# Patient Record
Sex: Female | Born: 1950
Health system: Southern US, Community
[De-identification: ages and names within clinical notes are randomized; demographics above are authoritative.]

## PROBLEM LIST (undated history)

## (undated) DIAGNOSIS — I10 Essential (primary) hypertension: Secondary | ICD-10-CM

## (undated) DIAGNOSIS — M1990 Unspecified osteoarthritis, unspecified site: Secondary | ICD-10-CM

## (undated) DIAGNOSIS — R42 Dizziness and giddiness: Secondary | ICD-10-CM

## (undated) DIAGNOSIS — I471 Supraventricular tachycardia: Secondary | ICD-10-CM

## (undated) DIAGNOSIS — Z8679 Personal history of other diseases of the circulatory system: Secondary | ICD-10-CM

## (undated) DIAGNOSIS — E669 Obesity, unspecified: Secondary | ICD-10-CM

## (undated) DIAGNOSIS — H269 Unspecified cataract: Secondary | ICD-10-CM

## (undated) DIAGNOSIS — Z860100 Personal history of colon polyps, unspecified: Secondary | ICD-10-CM

## (undated) DIAGNOSIS — F419 Anxiety disorder, unspecified: Secondary | ICD-10-CM

## (undated) DIAGNOSIS — I4719 Other supraventricular tachycardia: Secondary | ICD-10-CM

## (undated) DIAGNOSIS — R011 Cardiac murmur, unspecified: Secondary | ICD-10-CM

## (undated) DIAGNOSIS — R7302 Impaired glucose tolerance (oral): Secondary | ICD-10-CM

## (undated) DIAGNOSIS — Z720 Tobacco use: Secondary | ICD-10-CM

## (undated) DIAGNOSIS — T7840XA Allergy, unspecified, initial encounter: Secondary | ICD-10-CM

## (undated) DIAGNOSIS — R7303 Prediabetes: Secondary | ICD-10-CM

## (undated) DIAGNOSIS — G4730 Sleep apnea, unspecified: Secondary | ICD-10-CM

## (undated) HISTORY — DX: Dizziness and giddiness: R42

## (undated) HISTORY — DX: Sleep apnea, unspecified: G47.30

## (undated) HISTORY — DX: Personal history of colon polyps, unspecified: Z86.0100

## (undated) HISTORY — DX: Unspecified cataract: H26.9

## (undated) HISTORY — DX: Impaired glucose tolerance (oral): R73.02

## (undated) HISTORY — PX: CATARACT EXTRACTION: SUR2

## (undated) HISTORY — DX: Cardiac murmur, unspecified: R01.1

## (undated) HISTORY — DX: Personal history of colonic polyps: Z86.010

## (undated) HISTORY — DX: Other supraventricular tachycardia: I47.19

## (undated) HISTORY — PX: EYE SURGERY: SHX253

## (undated) HISTORY — PX: POLYPECTOMY: SHX149

## (undated) HISTORY — DX: Anxiety disorder, unspecified: F41.9

## (undated) HISTORY — DX: Essential (primary) hypertension: I10

## (undated) HISTORY — DX: Unspecified osteoarthritis, unspecified site: M19.90

## (undated) HISTORY — DX: Allergy, unspecified, initial encounter: T78.40XA

## (undated) HISTORY — DX: Obesity, unspecified: E66.9

## (undated) HISTORY — DX: Tobacco use: Z72.0

## (undated) HISTORY — DX: Supraventricular tachycardia: I47.1

## (undated) HISTORY — PX: OTHER SURGICAL HISTORY: SHX169

## (undated) HISTORY — DX: Personal history of other diseases of the circulatory system: Z86.79

## (undated) HISTORY — PX: DIAGNOSTIC LAPAROSCOPY: SUR761

---

## 2002-12-19 ENCOUNTER — Ambulatory Visit (HOSPITAL_BASED_OUTPATIENT_CLINIC_OR_DEPARTMENT_OTHER): Admission: RE | Admit: 2002-12-19 | Discharge: 2002-12-19 | Payer: Self-pay | Admitting: Orthopedic Surgery

## 2004-03-19 ENCOUNTER — Ambulatory Visit: Payer: Self-pay | Admitting: Internal Medicine

## 2004-09-17 ENCOUNTER — Ambulatory Visit: Payer: Self-pay | Admitting: Internal Medicine

## 2005-02-17 ENCOUNTER — Ambulatory Visit: Payer: Self-pay | Admitting: Internal Medicine

## 2005-02-24 ENCOUNTER — Ambulatory Visit: Payer: Self-pay | Admitting: Internal Medicine

## 2005-03-17 ENCOUNTER — Ambulatory Visit: Payer: Self-pay | Admitting: Gastroenterology

## 2005-04-23 ENCOUNTER — Encounter: Payer: Self-pay | Admitting: Internal Medicine

## 2005-04-23 ENCOUNTER — Encounter (INDEPENDENT_AMBULATORY_CARE_PROVIDER_SITE_OTHER): Payer: Self-pay | Admitting: *Deleted

## 2005-04-23 ENCOUNTER — Ambulatory Visit: Payer: Self-pay | Admitting: Gastroenterology

## 2005-04-23 HISTORY — PX: COLONOSCOPY: SHX174

## 2005-08-31 ENCOUNTER — Ambulatory Visit: Payer: Self-pay | Admitting: Internal Medicine

## 2005-09-17 ENCOUNTER — Encounter: Payer: Self-pay | Admitting: Internal Medicine

## 2006-03-02 ENCOUNTER — Ambulatory Visit: Payer: Self-pay | Admitting: Internal Medicine

## 2006-08-31 ENCOUNTER — Ambulatory Visit: Payer: Self-pay | Admitting: Internal Medicine

## 2006-08-31 LAB — CONVERTED CEMR LAB
ALT: 36 units/L (ref 0–40)
AST: 32 units/L (ref 0–37)
Albumin: 3.3 g/dL — ABNORMAL LOW (ref 3.5–5.2)
Alkaline Phosphatase: 89 units/L (ref 39–117)
BUN: 15 mg/dL (ref 6–23)
Basophils Absolute: 0 10*3/uL (ref 0.0–0.1)
Basophils Relative: 0.1 % (ref 0.0–1.0)
Bilirubin, Direct: 0.2 mg/dL (ref 0.0–0.3)
CO2: 35 meq/L — ABNORMAL HIGH (ref 19–32)
Calcium: 9.4 mg/dL (ref 8.4–10.5)
Chloride: 100 meq/L (ref 96–112)
Creatinine, Ser: 0.8 mg/dL (ref 0.4–1.2)
Eosinophils Absolute: 0.3 10*3/uL (ref 0.0–0.6)
Eosinophils Relative: 3 % (ref 0.0–5.0)
GFR calc Af Amer: 96 mL/min
GFR calc non Af Amer: 79 mL/min
Glucose, Bld: 143 mg/dL — ABNORMAL HIGH (ref 70–99)
HCT: 40.6 % (ref 36.0–46.0)
Hemoglobin: 13.8 g/dL (ref 12.0–15.0)
Hgb A1c MFr Bld: 6.5 % — ABNORMAL HIGH (ref 4.6–6.0)
Lymphocytes Relative: 20 % (ref 12.0–46.0)
MCHC: 34.1 g/dL (ref 30.0–36.0)
MCV: 94.1 fL (ref 78.0–100.0)
Monocytes Absolute: 0.6 10*3/uL (ref 0.2–0.7)
Monocytes Relative: 6.6 % (ref 3.0–11.0)
Neutro Abs: 6.6 10*3/uL (ref 1.4–7.7)
Neutrophils Relative %: 70.3 % (ref 43.0–77.0)
Platelets: 337 10*3/uL (ref 150–400)
Potassium: 3.5 meq/L (ref 3.5–5.1)
RBC: 4.32 M/uL (ref 3.87–5.11)
RDW: 12.2 % (ref 11.5–14.6)
Sodium: 144 meq/L (ref 135–145)
TSH: 1.29 microintl units/mL (ref 0.35–5.50)
Total Bilirubin: 0.7 mg/dL (ref 0.3–1.2)
Total Protein: 7.4 g/dL (ref 6.0–8.3)
WBC: 9.4 10*3/uL (ref 4.5–10.5)

## 2006-11-24 DIAGNOSIS — F410 Panic disorder [episodic paroxysmal anxiety] without agoraphobia: Secondary | ICD-10-CM | POA: Insufficient documentation

## 2006-11-24 DIAGNOSIS — I10 Essential (primary) hypertension: Secondary | ICD-10-CM | POA: Insufficient documentation

## 2006-11-24 DIAGNOSIS — E669 Obesity, unspecified: Secondary | ICD-10-CM | POA: Insufficient documentation

## 2006-11-24 DIAGNOSIS — I471 Supraventricular tachycardia: Secondary | ICD-10-CM | POA: Insufficient documentation

## 2006-12-08 ENCOUNTER — Ambulatory Visit: Payer: Self-pay | Admitting: Internal Medicine

## 2006-12-08 LAB — CONVERTED CEMR LAB
ALT: 22 units/L (ref 0–35)
AST: 19 units/L (ref 0–37)
Albumin: 3.4 g/dL — ABNORMAL LOW (ref 3.5–5.2)
Alkaline Phosphatase: 80 units/L (ref 39–117)
BUN: 13 mg/dL (ref 6–23)
Basophils Absolute: 0 10*3/uL (ref 0.0–0.1)
Basophils Relative: 0.5 % (ref 0.0–1.0)
Bilirubin, Direct: 0.1 mg/dL (ref 0.0–0.3)
Blood in Urine, dipstick: NEGATIVE
CO2: 32 meq/L (ref 19–32)
Calcium: 9.5 mg/dL (ref 8.4–10.5)
Chloride: 103 meq/L (ref 96–112)
Cholesterol: 194 mg/dL (ref 0–200)
Creatinine, Ser: 0.9 mg/dL (ref 0.4–1.2)
Eosinophils Absolute: 0.2 10*3/uL (ref 0.0–0.6)
Eosinophils Relative: 2.2 % (ref 0.0–5.0)
GFR calc Af Amer: 84 mL/min
GFR calc non Af Amer: 69 mL/min
Glucose, Bld: 108 mg/dL — ABNORMAL HIGH (ref 70–99)
Glucose, Urine, Semiquant: NEGATIVE
HCT: 42.4 % (ref 36.0–46.0)
HDL: 21 mg/dL — ABNORMAL LOW (ref >39.0)
Hemoglobin: 14.4 g/dL (ref 12.0–15.0)
Ketones, urine, test strip: NEGATIVE
LDL Cholesterol: 150 mg/dL — ABNORMAL HIGH (ref 0–99)
Lymphocytes Relative: 20.6 % (ref 12.0–46.0)
MCHC: 33.9 g/dL (ref 30.0–36.0)
MCV: 94.6 fL (ref 78.0–100.0)
Monocytes Absolute: 0.5 10*3/uL (ref 0.2–0.7)
Monocytes Relative: 7.2 % (ref 3.0–11.0)
Neutro Abs: 4.9 10*3/uL (ref 1.4–7.7)
Neutrophils Relative %: 69.5 % (ref 43.0–77.0)
Nitrite: NEGATIVE
Platelets: 304 10*3/uL (ref 150–400)
Potassium: 4.3 meq/L (ref 3.5–5.1)
Protein, U semiquant: NEGATIVE
RBC: 4.47 M/uL (ref 3.87–5.11)
RDW: 12.5 % (ref 11.5–14.6)
Sodium: 144 meq/L (ref 135–145)
Specific Gravity, Urine: 1.02
TSH: 1.65 microintl units/mL (ref 0.35–5.50)
Total Bilirubin: 0.9 mg/dL (ref 0.3–1.2)
Total CHOL/HDL Ratio: 9.2
Total Protein: 7.3 g/dL (ref 6.0–8.3)
Triglycerides: 116 mg/dL (ref 0–149)
Urobilinogen, UA: NEGATIVE
VLDL: 23 mg/dL (ref 0–40)
WBC: 7.1 10*3/uL (ref 4.5–10.5)
pH: 6

## 2006-12-14 ENCOUNTER — Encounter: Payer: Self-pay | Admitting: Internal Medicine

## 2006-12-15 ENCOUNTER — Ambulatory Visit: Payer: Self-pay | Admitting: Internal Medicine

## 2006-12-15 DIAGNOSIS — B351 Tinea unguium: Secondary | ICD-10-CM | POA: Insufficient documentation

## 2007-04-19 ENCOUNTER — Ambulatory Visit: Payer: Self-pay | Admitting: Internal Medicine

## 2007-11-25 ENCOUNTER — Ambulatory Visit: Payer: Self-pay | Admitting: Internal Medicine

## 2007-11-25 DIAGNOSIS — F419 Anxiety disorder, unspecified: Secondary | ICD-10-CM | POA: Insufficient documentation

## 2007-11-25 DIAGNOSIS — Z86010 Personal history of colonic polyps: Secondary | ICD-10-CM | POA: Insufficient documentation

## 2007-11-25 DIAGNOSIS — J309 Allergic rhinitis, unspecified: Secondary | ICD-10-CM | POA: Insufficient documentation

## 2007-11-25 DIAGNOSIS — F411 Generalized anxiety disorder: Secondary | ICD-10-CM

## 2008-05-25 ENCOUNTER — Ambulatory Visit: Payer: Self-pay | Admitting: Internal Medicine

## 2008-05-25 LAB — CONVERTED CEMR LAB: Hgb A1c MFr Bld: 5.7 % (ref 4.6–6.0)

## 2008-07-04 ENCOUNTER — Telehealth: Payer: Self-pay | Admitting: Internal Medicine

## 2008-11-16 ENCOUNTER — Ambulatory Visit: Payer: Self-pay | Admitting: Internal Medicine

## 2008-11-16 LAB — CONVERTED CEMR LAB: Blood Glucose, Fingerstick: 102

## 2009-03-14 ENCOUNTER — Ambulatory Visit: Payer: Self-pay | Admitting: Internal Medicine

## 2009-06-18 ENCOUNTER — Ambulatory Visit: Payer: Self-pay | Admitting: Internal Medicine

## 2009-06-18 LAB — CONVERTED CEMR LAB: Blood Glucose, Fingerstick: 80

## 2009-12-10 ENCOUNTER — Ambulatory Visit: Payer: Self-pay | Admitting: Internal Medicine

## 2010-06-06 ENCOUNTER — Ambulatory Visit
Admission: RE | Admit: 2010-06-06 | Discharge: 2010-06-06 | Payer: Self-pay | Source: Home / Self Care | Attending: Internal Medicine | Admitting: Internal Medicine

## 2010-06-06 ENCOUNTER — Other Ambulatory Visit: Payer: Self-pay | Admitting: Internal Medicine

## 2010-06-06 LAB — BASIC METABOLIC PANEL
BUN: 18 mg/dL (ref 6–23)
CO2: 32 mEq/L (ref 19–32)
Calcium: 9.2 mg/dL (ref 8.4–10.5)
Chloride: 103 mEq/L (ref 96–112)
Creatinine, Ser: 0.7 mg/dL (ref 0.4–1.2)
GFR: 97.27 mL/min (ref >60.00)
Glucose, Bld: 102 mg/dL — ABNORMAL HIGH (ref 70–99)
Potassium: 5 mEq/L (ref 3.5–5.1)
Sodium: 144 mEq/L (ref 135–145)

## 2010-06-06 LAB — CONVERTED CEMR LAB
Blood in Urine, dipstick: NEGATIVE
Ketones, urine, test strip: NEGATIVE
Nitrite: NEGATIVE
Protein, U semiquant: NEGATIVE
Specific Gravity, Urine: 1.02
Urobilinogen, UA: 0.2
WBC Urine, dipstick: NEGATIVE

## 2010-06-06 LAB — CBC WITH DIFFERENTIAL/PLATELET
Basophils Absolute: 0 10*3/uL (ref 0.0–0.1)
Basophils Relative: 0.5 % (ref 0.0–3.0)
Eosinophils Absolute: 0.2 10*3/uL (ref 0.0–0.7)
Eosinophils Relative: 3.7 % (ref 0.0–5.0)
HCT: 40.5 % (ref 36.0–46.0)
Hemoglobin: 13.8 g/dL (ref 12.0–15.0)
Lymphocytes Relative: 22.1 % (ref 12.0–46.0)
Lymphs Abs: 1.5 10*3/uL (ref 0.7–4.0)
MCHC: 34.1 g/dL (ref 30.0–36.0)
MCV: 96.5 fl (ref 78.0–100.0)
Monocytes Absolute: 0.5 10*3/uL (ref 0.1–1.0)
Monocytes Relative: 7.2 % (ref 3.0–12.0)
Neutro Abs: 4.4 10*3/uL (ref 1.4–7.7)
Neutrophils Relative %: 66.5 % (ref 43.0–77.0)
Platelets: 293 10*3/uL (ref 150.0–400.0)
RBC: 4.2 Mil/uL (ref 3.87–5.11)
RDW: 13.2 % (ref 11.5–14.6)
WBC: 6.6 10*3/uL (ref 4.5–10.5)

## 2010-06-06 LAB — HEPATIC FUNCTION PANEL
ALT: 19 U/L (ref 0–35)
AST: 20 U/L (ref 0–37)
Albumin: 3.7 g/dL (ref 3.5–5.2)
Alkaline Phosphatase: 69 U/L (ref 39–117)
Bilirubin, Direct: 0.1 mg/dL (ref 0.0–0.3)
Total Bilirubin: 1 mg/dL (ref 0.3–1.2)
Total Protein: 6.8 g/dL (ref 6.0–8.3)

## 2010-06-06 LAB — LIPID PANEL
Cholesterol: 217 mg/dL — ABNORMAL HIGH (ref 0–200)
HDL: 36.9 mg/dL — ABNORMAL LOW (ref >39.00)
Total CHOL/HDL Ratio: 6
Triglycerides: 131 mg/dL (ref 0.0–149.0)
VLDL: 26.2 mg/dL (ref 0.0–40.0)

## 2010-06-06 LAB — TSH: TSH: 1.5 u[IU]/mL (ref 0.35–5.50)

## 2010-06-06 LAB — LDL CHOLESTEROL, DIRECT: Direct LDL: 170.5 mg/dL

## 2010-06-13 ENCOUNTER — Ambulatory Visit
Admission: RE | Admit: 2010-06-13 | Discharge: 2010-06-13 | Payer: Self-pay | Source: Home / Self Care | Attending: Internal Medicine | Admitting: Internal Medicine

## 2010-06-13 LAB — HM DIABETES FOOT EXAM

## 2010-06-17 NOTE — Assessment & Plan Note (Signed)
Summary: 6 month rov/njr/pt rescd//ccm   Vital Signs:  Patient profile:   60 year old female Weight:      219 pounds Temp:     98.7 degrees F oral BP sitting:   120 / 80  (right arm) Cuff size:   large  Vitals Entered By: Raechel Ache, RN (June 18, 2009 12:53 PM) CC: rov , no complaints CBG Result 80   CC:  rov  and no complaints.  History of Present Illness: 60 year old  patient  who is in today for follow-up of her hypertension.  She has a history of impaired glucose tolerance, which has resolved with weight loss.  Over the past 6 months.  Her weight has been basically unchanged.  She feels well today.  Her last Timbo, and once he was in a nondiabetic range.  A random blood sugar today 80  Allergies: 1)  ! Erythromycin 2)  ! Amoxicillin  Past History:  Past Medical History: Reviewed history from 05/25/2008 and no changes required. Hypertension Colonic polyps, hx of  tobacco use history of PSVT Allergic rhinitis Anxiety impaired glucose tolerance  Review of Systems  The patient denies anorexia, fever, weight loss, weight gain, vision loss, decreased hearing, hoarseness, chest pain, syncope, dyspnea on exertion, peripheral edema, prolonged cough, headaches, hemoptysis, abdominal pain, melena, hematochezia, severe indigestion/heartburn, hematuria, incontinence, genital sores, muscle weakness, suspicious skin lesions, transient blindness, difficulty walking, depression, unusual weight change, abnormal bleeding, enlarged lymph nodes, angioedema, and breast masses.    Physical Exam  General:  overweight-appearing.  118/78overweight-appearing.   Head:  Normocephalic and atraumatic without obvious abnormalities. No apparent alopecia or balding. Mouth:  Oral mucosa and oropharynx without lesions or exudates.  Teeth in good repair. Neck:  No deformities, masses, or tenderness noted. Lungs:  Normal respiratory effort, chest expands symmetrically. Lungs are clear to  auscultation, no crackles or wheezes. Heart:  Normal rate and regular rhythm. S1 and S2 normal without gallop, murmur, click, rub or other extra sounds. Abdomen:  Bowel sounds positive,abdomen soft and non-tender without masses, organomegaly or hernias noted. Msk:  No deformity or scoliosis noted of thoracic or lumbar spine.   Pulses:  R and L carotid,radial,femoral,dorsalis pedis and posterior tibial pulses are full and equal bilaterally Extremities:  No clubbing, cyanosis, edema, or deformity noted with normal full range of motion of all joints.    Diabetes Management Exam:    Eye Exam:       Eye Exam done here today          Results: normal   Impression & Recommendations:  Problem # 1:  DIABETES MELLITUS, TYPE II (ICD-250.00)  Her updated medication list for this problem includes:    Benazepril-hydrochlorothiazide 20-12.5 Mg Tabs (Benazepril-hydrochlorothiazide) .Marland Kitchen... 1/2 once daily    Bayer Low Strength 81 Mg Tbec (Aspirin) .Marland Kitchen... 1 once daily  Her updated medication list for this problem includes:    Benazepril-hydrochlorothiazide 20-12.5 Mg Tabs (Benazepril-hydrochlorothiazide) .Marland Kitchen... 1/2 once daily    Bayer Low Strength 81 Mg Tbec (Aspirin) .Marland Kitchen... 1 once daily  Orders: Capillary Blood Glucose/CBG (84132)  Problem # 2:  HYPERTENSION (ICD-401.9)  Her updated medication list for this problem includes:    Toprol Xl 50 Mg Tb24 (Metoprolol succinate) .Marland Kitchen... 1 once daily    Benazepril-hydrochlorothiazide 20-12.5 Mg Tabs (Benazepril-hydrochlorothiazide) .Marland Kitchen... 1/2 once daily  Her updated medication list for this problem includes:    Toprol Xl 50 Mg Tb24 (Metoprolol succinate) .Marland Kitchen... 1 once daily    Benazepril-hydrochlorothiazide  20-12.5 Mg Tabs (Benazepril-hydrochlorothiazide) .Marland Kitchen... 1/2 once daily  Complete Medication List: 1)  Toprol Xl 50 Mg Tb24 (Metoprolol succinate) .Marland Kitchen.. 1 once daily 2)  Paroxetine Hcl 40 Mg Tabs (Paroxetine hcl) .Marland Kitchen.. 1 once daily 3)   Benazepril-hydrochlorothiazide 20-12.5 Mg Tabs (Benazepril-hydrochlorothiazide) .... 1/2 once daily 4)  Bayer Low Strength 81 Mg Tbec (Aspirin) .Marland Kitchen.. 1 once daily 5)  Triamcinolone Acetonide 0.1 % Crea (Triamcinolone acetonide) .... Use twice daily as needed  Patient Instructions: 1)  Please schedule a follow-up appointment in 6 months. 2)  Limit your Sodium (Salt). 3)  It is important that you exercise regularly at least 20 minutes 5 times a week. If you develop chest pain, have severe difficulty breathing, or feel very tired , stop exercising immediately and seek medical attention. 4)  You need to lose weight. Consider a lower calorie diet and regular exercise.  5)  Check your Blood Pressure regularly. If it is above: 150/90  you should make an appointment. Prescriptions: PAROXETINE HCL 40 MG  TABS (PAROXETINE HCL) 1 once daily  #180 Tablet x 6   Entered and Authorized by:   Gordy Savers  MD   Signed by:   Gordy Savers  MD on 06/18/2009   Method used:   Print then Give to Patient   RxID:   [id number] BENAZEPRIL-HYDROCHLOROTHIAZIDE 20-12.5 MG  TABS (BENAZEPRIL-HYDROCHLOROTHIAZIDE) 1/2 once daily  #90 x 6   Entered and Authorized by:   Gordy Savers  MD   Signed by:   Gordy Savers  MD on 06/18/2009   Method used:   Print then Give to Patient   RxID:   [id number] TOPROL XL 50 MG  TB24 (METOPROLOL SUCCINATE) 1 once daily  #90 Tablet x 6   Entered and Authorized by:   Gordy Savers  MD   Signed by:   Gordy Savers  MD on 06/18/2009   Method used:   Print then Give to Patient   RxID:   [id number]

## 2010-06-17 NOTE — Assessment & Plan Note (Signed)
Summary: 6 month fup//ccm   Vital Signs:  Patient profile:   60 year old female Weight:      220 pounds Temp:     98.5 degrees F oral BP sitting:   110 / 70  (right arm) Cuff size:   regular  Vitals Entered By: Duard Brady LPN (December 10, 2009 8:09 AM) CC: 6 moas rov - (R) ear problems   CC:  6 moas rov - (R) ear problems.  History of Present Illness: 60 year old patient seen today for follow-up of her hypertension.  She has done quite well.  She has a history of a mild pain disorder, which has been quite stable.  Her only complaint is  some intermittent right ear discomfort.  This actually is in the high right submandibular region.  This is associated with postnasal drip and mild sore throat.  No fever.  She has a history of impaired glucose tolerance that has resolved with weight loss.  Her weight  today is basically unchanged compared to 6 months ago  Preventive Screening-Counseling & Management  Alcohol-Tobacco     Smoking Status: current  Allergies: 1)  ! Erythromycin 2)  ! Amoxicillin  Past History:  Past Medical History: Reviewed history from 05/25/2008 and no changes required. Hypertension Colonic polyps, hx of  tobacco use history of PSVT Allergic rhinitis Anxiety impaired glucose tolerance  Past Surgical History: Reviewed history from 11/24/2006 and no changes required. G3 P2 A1 Cataract extraction Ankle sx-left fx Foot sx Colonoscopy-04/23/2005  Review of Systems  The patient denies anorexia, fever, weight loss, weight gain, vision loss, decreased hearing, hoarseness, chest pain, syncope, dyspnea on exertion, peripheral edema, prolonged cough, headaches, hemoptysis, abdominal pain, melena, hematochezia, severe indigestion/heartburn, hematuria, incontinence, genital sores, muscle weakness, suspicious skin lesions, transient blindness, difficulty walking, depression, unusual weight change, abnormal bleeding, enlarged lymph nodes, angioedema, and breast  masses.    Physical Exam  General:  overweight-appearing.  110/70 Head:  Normocephalic and atraumatic without obvious abnormalities. No apparent alopecia or balding. Eyes:  No corneal or conjunctival inflammation noted. EOMI. Perrla. Funduscopic exam benign, without hemorrhages, exudates or papilledema. Vision grossly normal. Ears:  External ear exam shows no significant lesions or deformities.  Otoscopic examination reveals clear canals, tympanic membranes are intact bilaterally without bulging, retraction, inflammation or discharge. Hearing is grossly normal bilaterally. Mouth:  pharyngeal erythema.  pharyngeal erythema.   Neck:  slight tenderness in the high right mandibular area Chest Wall:  No deformities, masses, or tenderness noted. Lungs:  Normal respiratory effort, chest expands symmetrically. Lungs are clear to auscultation, no crackles or wheezes. Heart:  Normal rate and regular rhythm. S1 and S2 normal without gallop, murmur, click, rub or other extra sounds. Abdomen:  Bowel sounds positive,abdomen soft and non-tender without masses, organomegaly or hernias noted. Msk:  No deformity or scoliosis noted of thoracic or lumbar spine.   Pulses:  R and L carotid,radial,femoral,dorsalis pedis and posterior tibial pulses are full and equal bilaterally  Diabetes Management Exam:    Foot Exam (with socks and/or shoes not present):       Sensory-Pinprick/Light touch:          Left medial foot (L-4): normal          Left dorsal foot (L-5): normal          Left lateral foot (S-1): normal          Right medial foot (L-4): normal          Right dorsal foot (L-5):  normal          Right lateral foot (S-1): normal       Sensory-Monofilament:          Left foot: normal          Right foot: normal       Inspection:          Left foot: normal          Right foot: normal       Nails:          Left foot: normal          Right foot: normal    Foot Exam by Podiatrist:       Date: 12/10/2009        Results: no diabetic findings       Done by: PCP   Impression & Recommendations:  Problem # 1:  ALLERGIC RHINITIS (ICD-477.9)  Her updated medication list for this problem includes:    Fluticasone Propionate 50 Mcg/act Susp (Fluticasone propionate) ..... Use daily  Problem # 2:  PANIC DISORDER (ICD-300.01)  Her updated medication list for this problem includes:    Paroxetine Hcl 40 Mg Tabs (Paroxetine hcl) .Marland Kitchen... 1 once daily  Her updated medication list for this problem includes:    Paroxetine Hcl 40 Mg Tabs (Paroxetine hcl) .Marland Kitchen... 1 once daily  Problem # 3:  HYPERTENSION (ICD-401.9)  Her updated medication list for this problem includes:    Toprol Xl 50 Mg Tb24 (Metoprolol succinate) .Marland Kitchen... 1 once daily    Benazepril-hydrochlorothiazide 20-12.5 Mg Tabs (Benazepril-hydrochlorothiazide) .Marland Kitchen... 1/2 once daily  Her updated medication list for this problem includes:    Toprol Xl 50 Mg Tb24 (Metoprolol succinate) .Marland Kitchen... 1 once daily    Benazepril-hydrochlorothiazide 20-12.5 Mg Tabs (Benazepril-hydrochlorothiazide) .Marland Kitchen... 1/2 once daily  Complete Medication List: 1)  Toprol Xl 50 Mg Tb24 (Metoprolol succinate) .Marland Kitchen.. 1 once daily 2)  Paroxetine Hcl 40 Mg Tabs (Paroxetine hcl) .Marland Kitchen.. 1 once daily 3)  Benazepril-hydrochlorothiazide 20-12.5 Mg Tabs (Benazepril-hydrochlorothiazide) .... 1/2 once daily 4)  Bayer Low Strength 81 Mg Tbec (Aspirin) .Marland Kitchen.. 1 once daily 5)  Triamcinolone Acetonide 0.1 % Crea (Triamcinolone acetonide) .... Use twice daily as needed 6)  Fluticasone Propionate 50 Mcg/act Susp (Fluticasone propionate) .... Use daily  Patient Instructions: 1)  Please schedule a follow-up appointment in 6 months. 2)  Limit your Sodium (Salt). 3)  It is important that you exercise regularly at least 20 minutes 5 times a week. If you develop chest pain, have severe difficulty breathing, or feel very tired , stop exercising immediately and seek medical attention. 4)  You need to lose  weight. Consider a lower calorie diet and regular exercise.  Prescriptions: FLUTICASONE PROPIONATE 50 MCG/ACT SUSP (FLUTICASONE PROPIONATE) use daily  #1 x 6   Entered and Authorized by:   Gordy Savers  MD   Signed by:   Gordy Savers  MD on 12/10/2009   Method used:   [facility]ronically to        [facility]       [address]       [place], [place]  [address]       Ph: [phone]       Fax: [phone]   RxID:   [id number] BENAZEPRIL-HYDROCHLOROTHIAZIDE 20-12.5 MG  TABS (BENAZEPRIL-HYDROCHLOROTHIAZIDE) 1/2 once daily  #90 x 6   Entered and Authorized by:   Gordy Savers  MD  Signed by:   Gordy Savers  MD on 12/10/2009   Method used:   Electronically to        [facility]       [address]       [place], [place]  [address]       Ph: [phone]       Fax: [phone]   RxID:   [id number] PAROXETINE HCL 40 MG  TABS (PAROXETINE HCL) 1 once daily  #180 Tablet x 6   Entered and Authorized by:   Gordy Savers  MD   Signed by:   Gordy Savers  MD on 12/10/2009   Method used:   Electronically to        [facility]       [address]       [place], [place]  [address]       Ph: [phone]       Fax: [phone]   RxID:   [id number] TOPROL XL 50 MG  TB24 (METOPROLOL SUCCINATE) 1 once daily  #90 Tablet x 6   Entered and Authorized by:   Gordy Savers  MD   Signed by:   Gordy Savers  MD on 12/10/2009   Method used:   Electronically to        [facility] (retail)       [address]       [place], [place]  [address]       Ph: [phone]       Fax: [phone]   RxID:   [id number]   Appended Document: 6 month fup//ccm    CBG Result 124   Allergies: 1)  ! Erythromycin 2)  ! Amoxicillin   Complete Medication List: 1)  Toprol  Xl 50 Mg Tb24 (Metoprolol succinate) .Marland Kitchen.. 1 once daily 2)  Paroxetine Hcl 40 Mg Tabs (Paroxetine hcl) .Marland Kitchen.. 1 once daily 3)  Benazepril-hydrochlorothiazide 20-12.5 Mg Tabs (Benazepril-hydrochlorothiazide) .... 1/2 once daily 4)  Bayer Low Strength 81 Mg Tbec (Aspirin) .Marland Kitchen.. 1 once daily 5)  Triamcinolone Acetonide 0.1 % Crea (Triamcinolone acetonide) .... Use twice daily as needed 6)  Fluticasone Propionate 50 Mcg/act Susp (Fluticasone propionate) .... Use daily  Other Orders: Capillary Blood Glucose/CBG [phone])

## 2010-06-19 NOTE — Assessment & Plan Note (Signed)
Summary: cpx/no pap/njr   Vital Signs:  Patient profile:   60 year old [name] Height:      67 inches Weight:      220 pounds BMI:     34.58 O2 Sat:      97 % on Room air Temp:     98.3 degrees F oral Pulse rate:   89 / minute BP sitting:   120 / 76  (right arm) Cuff size:   regular  Vitals Entered By: Duard Brady LPN (June 13, 2010 8:45 AM)  O2 Flow:  Room air CC: cpx - doing well  Is Patient Diabetic? No   CC:  cpx - doing well .  History of Present Illness: 60 year old patient who is seen today for a health maintenance examination.  She has a history of colonic polyps.  Impaired glucose toleranc, exogenous  obesity and treated hypertension.  She has done quite well.  Her last mammogram was approximately 1 1/2 years ago.  She has not received routine gynecologic care in a number of years since her gynecologist retired.  Her blood pressure has been well controlled.  Her weight has been unchanged.  Laboratory studies reviewed  Preventive Screening-Counseling & Management  Alcohol-Tobacco     Smoking Status: current     Smoking Cessation Counseling: yes  Allergies: 1)  ! Erythromycin 2)  ! Amoxicillin  Past History:  Past Medical History: Hypertension Colonic polyps, hx of  tobacco use history of PSVT Allergic rhinitis Anxiety impaired glucose tolerance obesity  Past Surgical History: Reviewed history from 11/24/2006 and no changes required. G3 P2 A1 Cataract extraction Ankle sx-left fx Foot sx Colonoscopy-04/23/2005  Family History: Reviewed history from 11/25/2007 and no changes required. Fam hx Alzheimers Father died 7, complications of dementia.  Mother's in her 64s in good health one brother died at 43, unclear whether he died as a result of alcohol-like intoxication or possibly a congenital heart disease.  Two sisters are well mother status post CABG   Social History: Reviewed history from 04/19/2007 and no changes required. mother to  undergo CABG later this week  Review of Systems  The patient denies anorexia, fever, weight loss, weight gain, vision loss, decreased hearing, hoarseness, chest pain, syncope, dyspnea on exertion, peripheral edema, prolonged cough, headaches, hemoptysis, abdominal pain, melena, hematochezia, severe indigestion/heartburn, hematuria, incontinence, genital sores, muscle weakness, suspicious skin lesions, transient blindness, difficulty walking, depression, unusual weight change, abnormal bleeding, enlarged lymph nodes, angioedema, and breast masses.    Physical Exam  General:  overweight-appearing.  blood pressure low normal  Head:  Normocephalic and atraumatic without obvious abnormalities. No apparent alopecia or balding. Eyes:  No corneal or conjunctival inflammation noted. EOMI. Perrla. Funduscopic exam benign, without hemorrhages, exudates or papilledema. Vision grossly normal. Ears:  External ear exam shows no significant lesions or deformities.  Otoscopic examination reveals clear canals, tympanic membranes are intact bilaterally without bulging, retraction, inflammation or discharge. Hearing is grossly normal bilaterally. Nose:  External nasal examination shows no deformity or inflammation. Nasal mucosa are pink and moist without lesions or exudates. Mouth:  Oral mucosa and oropharynx without lesions or exudates.  Teeth in good repair. Neck:  No deformities, masses, or tenderness noted. Chest Wall:  No deformities, masses, or tenderness noted. Lungs:  Normal respiratory effort, chest expands symmetrically. Lungs are clear to auscultation, no crackles or wheezes. Heart:  Normal rate and regular rhythm. S1 and S2 normal without gallop, murmur, click, rub or other extra sounds. Abdomen:  Bowel sounds positive,abdomen  soft and non-tender without masses, organomegaly or hernias noted. Msk:  No deformity or scoliosis noted of thoracic or lumbar spine.   Pulses:  R and L  carotid,radial,femoral,dorsalis pedis and posterior tibial pulses are full and equal bilaterally Extremities:  No clubbing, cyanosis, edema, or deformity noted with normal full range of motion of all joints.   Neurologic:  No cranial nerve deficits noted. Station and gait are normal. Plantar reflexes are down-going bilaterally. DTRs are symmetrical throughout. Sensory, motor and coordinative functions appear intact. Skin:  Intact without suspicious lesions or rashes Cervical Nodes:  No lymphadenopathy noted Axillary Nodes:  No palpable lymphadenopathy Inguinal Nodes:  No significant adenopathy Psych:  Cognition and judgment appear intact. Alert and cooperative with normal attention span and concentration. No apparent delusions, illusions, hallucinations  Diabetes Management Exam:    Foot Exam (with socks and/or shoes not present):       Sensory-Pinprick/Light touch:          Left medial foot (L-4): normal          Left dorsal foot (L-5): normal          Left lateral foot (S-1): normal          Right medial foot (L-4): normal          Right dorsal foot (L-5): normal          Right lateral foot (S-1): normal       Sensory-Monofilament:          Left foot: normal          Right foot: normal       Inspection:          Left foot: normal          Right foot: normal       Nails:          Left foot: fungal infection          Right foot: fungal infection    Foot Exam by Podiatrist:       Date: 06/13/2010       Results: no diabetic findings       Done by: pcp    Eye Exam:       Eye Exam done here today          Results: normal   Impression & Recommendations:  Problem # 1:  PREVENTIVE HEALTH CARE (ICD-V70.0)  Complete Medication List: 1)  Toprol Xl 50 Mg Tb24 (Metoprolol succinate) .Marland Kitchen.. 1 once daily 2)  Paroxetine Hcl 40 Mg Tabs (Paroxetine hcl) .Marland Kitchen.. 1 once daily 3)  Benazepril-hydrochlorothiazide 20-12.5 Mg Tabs (Benazepril-hydrochlorothiazide) .... 1/2 once daily 4)  Bayer Low  Strength 81 Mg Tbec (Aspirin) .Marland Kitchen.. 1 once daily 5)  Triamcinolone Acetonide 0.1 % Crea (Triamcinolone acetonide) .... Use twice daily as needed 6)  Fluticasone Propionate 50 Mcg/act Susp (Fluticasone propionate) .... Use daily 7)  Alprazolam 0.25 Mg Tabs (Alprazolam) .... One twice daily as needed  Patient Instructions: 1)  Please schedule a follow-up appointment in 6 months. 2)  Limit your Sodium (Salt). 3)  Tobacco is very bad for your health and your loved ones! You Should stop smoking!. 4)  It is important that you exercise regularly at least 20 minutes 5 times a week. If you develop chest pain, have severe difficulty breathing, or feel very tired , stop exercising immediately and seek medical attention. 5)  You need to lose weight. Consider a lower calorie diet and regular exercise.  6)  Schedule  your mammogram. 7)  Take calcium +Vitamin D daily. 8)  Schedule a colonoscopy/sigmoidoscopy to help detect colon cancer. Prescriptions: ALPRAZOLAM 0.25 MG TABS (ALPRAZOLAM) one twice daily as needed  #50 x 0   Entered and Authorized by:   Gordy Savers  MD   Signed by:   Gordy Savers  MD on 06/13/2010   Method used:   Print then Give to Patient   RxID:   [id number] FLUTICASONE PROPIONATE 50 MCG/ACT SUSP (FLUTICASONE PROPIONATE) use daily  #1 x 6   Entered and Authorized by:   Gordy Savers  MD   Signed by:   Gordy Savers  MD on 06/13/2010   Method used:   Electronically to        [facility]       [address]       [place], [place]  [address]       Ph: [phone]       Fax: [phone]   RxID:   [id number] BENAZEPRIL-HYDROCHLOROTHIAZIDE 20-12.5 MG  TABS (BENAZEPRIL-HYDROCHLOROTHIAZIDE) 1/2 once daily  #90 x 6   Entered and Authorized by:   Gordy Savers  MD   Signed by:   Gordy Savers  MD on 06/13/2010   Method used:   Electronically to        [facility]        [address]       [place], [place]  [address]       Ph: [phone]       Fax: [phone]   RxID:   [id number] PAROXETINE HCL 40 MG  TABS (PAROXETINE HCL) 1 once daily  #180 Tablet x 6   Entered and Authorized by:   Gordy Savers  MD   Signed by:   Gordy Savers  MD on 06/13/2010   Method used:   Electronically to        [facility]       [address]       [place], [place]  [address]       Ph: [phone]       Fax: [phone]   RxID:   [id number] TOPROL XL 50 MG  TB24 (METOPROLOL SUCCINATE) 1 once daily  #90 Tablet x 6   Entered and Authorized by:   Gordy Savers  MD   Signed by:   Gordy Savers  MD on 06/13/2010   Method used:   Electronically to        [facility] (retail)       [address]       [place], [place]  [address]       Ph: [phone]       Fax: [phone]   RxID:   [id number]    Orders Added: 1)  Est. Patient 40-64 years [[id number]]

## 2010-10-03 NOTE — Op Note (Signed)
NAME:  Glenda [name], Glenda [name]                           ACCOUNT NO.:  [id number]   MEDICAL RECORD NO.:  [id number]                   PATIENT TYPE:  AMB   LOCATION:  [facility]SC                                  FACILITY:  [facility]   PHYSICIAN:  Leonides Grills, M.D.                  DATE OF BIRTH:  07-Aug-1950   DATE OF PROCEDURE:  12/19/2002  DATE OF DISCHARGE:                                 OPERATIVE REPORT   PREOPERATIVE DIAGNOSIS:  Delayed union, left anterior process calcaneus  fracture.   POSTOPERATIVE DIAGNOSIS:  Delayed union, left anterior process calcaneus  fracture.   OPERATION PERFORMED:  1. Excision, left anterior process calcaneus.  2. Left stress x-rays foot.   SURGEON:  Leonides Grills, M.D.   ASSISTANT:  Lianne Cure, P.A.   ANESTHESIA:  General endotracheal tube.   ESTIMATED BLOOD LOSS:  Minimal.   TOURNIQUET TIME:  Approximately 20 minutes.   COMPLICATIONS:  None.   DISPOSITION:  Stable to PR.   INDICATIONS FOR PROCEDURE:  The patient is a 60 year old female who  sustained the above injury which has become persistently painful and has  decreased her overall subtalar motion due to the persistent pain.  After  options of continuing conservative management versus excision, the patient  wished to proceed with excision to accelerate her recovery by increasing her  ability to range her subtalar joint and she can weightbear immediately.  We  also discussed also fixing this piece which would also have to delay her  recovery and she wished to proceed with the excision of the fragment with  early mobilization. We went over the procedure in great detail explaining  all risks which include infection, neurovascular injury, arthritis,  persistent pain, worsening pain were all explained. Questions were  encouraged and answered.   DESCRIPTION OF PROCEDURE:  The patient was brought to the operating room and  placed in supine position initially after adequate general endotracheal tube  anesthesia was administered as well as Ancef 1g IV piggyback.  The left  lower extremity was then prepped and draped in sterile manner over a  proximally placed thigh tourniquet.  After the patient was placed in a  sloppy lateral position on the bean bag, all bony prominences were well  padded.  The limb was gravity exsanguinated and the tourniquet was elevated  to 290 mmHg after the limb was prepped and draped in a sterile manner.  A  longitudinal incision over the anterior process calcaneus was then made.  Dissection then carried down through skin.  Fascia was opened over the  extensor digitorum brevis.  The brevis was elevated off the anterior process  area.  This was a grossly loose piece and a large gap between both the main  body and the fragment.  A curved quarter inch osteotome under C-arm guidance  which stressed the pieces showed that it actually moved as well was then  applied across the nonunion site and the fragments then removed with a  rongeur.  X-ray was then obtained and showed that the fragment was  completely removed.  The wound was copiously irrigated with normal saline.  Ankle was then ranged.  It had excellent range.  There was no impingement or  mechanical block.  Once the wound was copiously irrigated with normal  saline, skin was closed with 4-0 nylon.  Sterile dressing was applied.  Cam  walker boot was applied.  The patient was stable to the PR.                                               Leonides Grills, M.D.    PB/MEDQ  D:  12/19/2002  T:  12/19/2002  Job:  [id number]

## 2011-03-06 ENCOUNTER — Ambulatory Visit: Payer: Self-pay | Admitting: Internal Medicine

## 2011-03-13 ENCOUNTER — Ambulatory Visit (INDEPENDENT_AMBULATORY_CARE_PROVIDER_SITE_OTHER): Payer: BC Managed Care – PPO | Admitting: Internal Medicine

## 2011-03-13 VITALS — BP 100/72 | Temp 98.7°F | Wt 228.0 lb

## 2011-03-13 DIAGNOSIS — R7302 Impaired glucose tolerance (oral): Secondary | ICD-10-CM | POA: Insufficient documentation

## 2011-03-13 DIAGNOSIS — Z23 Encounter for immunization: Secondary | ICD-10-CM

## 2011-03-13 DIAGNOSIS — I10 Essential (primary) hypertension: Secondary | ICD-10-CM

## 2011-03-13 DIAGNOSIS — R7309 Other abnormal glucose: Secondary | ICD-10-CM

## 2011-03-13 DIAGNOSIS — F411 Generalized anxiety disorder: Secondary | ICD-10-CM

## 2011-03-13 DIAGNOSIS — Z0000 Encounter for general adult medical examination without abnormal findings: Secondary | ICD-10-CM

## 2011-03-13 MED ORDER — BENAZEPRIL-HYDROCHLOROTHIAZIDE 20-12.5 MG PO TABS: ORAL_TABLET | ORAL | Status: DC

## 2011-03-13 MED ORDER — PAROXETINE HCL 40 MG PO TABS: 40.0000 mg | ORAL_TABLET | ORAL | Status: DC

## 2011-03-13 MED ORDER — METOPROLOL SUCCINATE ER 50 MG PO TB24: 50.0000 mg | ORAL_TABLET | Freq: Every day | ORAL | Status: DC

## 2011-03-13 MED ORDER — TRIAMCINOLONE ACETONIDE 0.1 % EX CREA: 1.0000 "application " | TOPICAL_CREAM | Freq: Two times a day (BID) | CUTANEOUS | Status: DC

## 2011-03-13 MED ORDER — ALPRAZOLAM 0.25 MG PO TABS: 0.2500 mg | ORAL_TABLET | Freq: Two times a day (BID) | ORAL | Status: DC

## 2011-03-13 MED ORDER — FLUTICASONE PROPIONATE 50 MCG/ACT NA SUSP: 2.0000 | Freq: Every day | NASAL | Status: DC

## 2011-03-13 NOTE — Patient Instructions (Signed)
Limit your sodium (Salt) intake  You need to lose weight.  Consider a lower calorie diet and regular exercise.    It is important that you exercise regularly, at least 20 minutes 3 to 4 times per week.  If you develop chest pain or shortness of breath seek  medical attention.  Please check your blood pressure on a regular basis.  If it is consistently greater than 150/90, please make an office appointment.  Return in 6 months for follow-up  

## 2011-03-13 NOTE — Progress Notes (Signed)
  Subjective:    Patient ID: Glenda [name], female    DOB: 1950-11-23, 60 y.o.   MRN: [id number]  HPI 60 year old patient seen today for followup. She has a history of anxiety disorder hypertension impaired glucose tolerance. She has done quite well. No concerns or complaints. She also has allergic rhinitis which has been stable.    Review of Systems  Constitutional: Negative.   HENT: Negative for hearing loss, congestion, sore throat, rhinorrhea, dental problem, sinus pressure and tinnitus.   Eyes: Negative for pain, discharge and visual disturbance.  Respiratory: Negative for cough and shortness of breath.   Cardiovascular: Negative for chest pain, palpitations and leg swelling.  Gastrointestinal: Negative for nausea, vomiting, abdominal pain, diarrhea, constipation, blood in stool and abdominal distention.  Genitourinary: Negative for dysuria, urgency, frequency, hematuria, flank pain, vaginal bleeding, vaginal discharge, difficulty urinating, vaginal pain and pelvic pain.  Musculoskeletal: Negative for joint swelling, arthralgias and gait problem.  Skin: Negative for rash.  Neurological: Negative for dizziness, syncope, speech difficulty, weakness, numbness and headaches.  Hematological: Negative for adenopathy.  Psychiatric/Behavioral: Negative for behavioral problems, dysphoric mood and agitation. The patient is not nervous/anxious.        Objective:   Physical Exam  Constitutional: She is oriented to person, place, and time. She appears well-developed and well-nourished.  HENT:  Head: Normocephalic.  Right Ear: External ear normal.  Left Ear: External ear normal.  Mouth/Throat: Oropharynx is clear and moist.  Eyes: Conjunctivae and EOM are normal. Pupils are equal, round, and reactive to light.  Neck: Normal range of motion. Neck supple. No thyromegaly present.  Cardiovascular: Normal rate, regular rhythm, normal heart sounds and intact distal pulses.   Pulmonary/Chest: Effort  normal and breath sounds normal.  Abdominal: Soft. Bowel sounds are normal. She exhibits no mass. There is no tenderness.  Musculoskeletal: Normal range of motion.  Lymphadenopathy:    She has no cervical adenopathy.  Neurological: She is alert and oriented to person, place, and time.  Skin: Skin is warm and dry. No rash noted.  Psychiatric: She has a normal mood and affect. Her behavior is normal.          Assessment & Plan:   Hypertension well controlled Allergic rhinitis stable Exogenous obesity and history of impaired glucose tolerance  We'll see in 6 months for a complete physical Weight loss exercise encouraged

## 2011-09-11 ENCOUNTER — Encounter: Payer: Self-pay | Admitting: Internal Medicine

## 2011-09-11 ENCOUNTER — Ambulatory Visit (INDEPENDENT_AMBULATORY_CARE_PROVIDER_SITE_OTHER): Payer: BC Managed Care – PPO | Admitting: Internal Medicine

## 2011-09-11 VITALS — BP 110/70 | Temp 98.2°F | Wt 235.0 lb

## 2011-09-11 DIAGNOSIS — F411 Generalized anxiety disorder: Secondary | ICD-10-CM

## 2011-09-11 DIAGNOSIS — R7309 Other abnormal glucose: Secondary | ICD-10-CM

## 2011-09-11 DIAGNOSIS — I10 Essential (primary) hypertension: Secondary | ICD-10-CM

## 2011-09-11 DIAGNOSIS — R7302 Impaired glucose tolerance (oral): Secondary | ICD-10-CM

## 2011-09-11 DIAGNOSIS — J309 Allergic rhinitis, unspecified: Secondary | ICD-10-CM

## 2011-09-11 MED ORDER — ALPRAZOLAM 0.25 MG PO TABS: 0.2500 mg | ORAL_TABLET | Freq: Two times a day (BID) | ORAL | Status: DC

## 2011-09-11 NOTE — Progress Notes (Signed)
  Subjective:    Patient ID: Glenda [name], female    DOB: 11-20-1950, 61 y.o.   MRN: [id number]  HPI  61 year old patient who is in today for followup. She has a history of hypertension panic disorder exogenous obesity ongoing tobacco use allergic rhinitis and also a history of impaired glucose tolerance she is doing reasonably well.  Wt Readings from Last 3 Encounters:  09/11/11 235 lb (106.595 kg)  03/13/11 228 lb (103.42 kg)  06/13/10 220 lb (99.791 kg)      Review of Systems  Constitutional: Negative.   HENT: Negative for hearing loss, congestion, sore throat, rhinorrhea, dental problem, sinus pressure and tinnitus.   Eyes: Negative for pain, discharge and visual disturbance.  Respiratory: Negative for cough and shortness of breath.   Cardiovascular: Negative for chest pain, palpitations and leg swelling.  Gastrointestinal: Negative for nausea, vomiting, abdominal pain, diarrhea, constipation, blood in stool and abdominal distention.  Genitourinary: Negative for dysuria, urgency, frequency, hematuria, flank pain, vaginal bleeding, vaginal discharge, difficulty urinating, vaginal pain and pelvic pain.  Musculoskeletal: Negative for joint swelling, arthralgias (occasional left hip pain) and gait problem.  Skin: Negative for rash.  Neurological: Negative for dizziness, syncope, speech difficulty, weakness, numbness and headaches.  Hematological: Negative for adenopathy.  Psychiatric/Behavioral: Negative for behavioral problems, dysphoric mood and agitation. The patient is not nervous/anxious.        Objective:   Physical Exam  Constitutional: She is oriented to person, place, and time. She appears well-developed and well-nourished.       Obese. Blood pressure normal. Weight 235  HENT:  Head: Normocephalic.  Right Ear: External ear normal.  Left Ear: External ear normal.  Mouth/Throat: Oropharynx is clear and moist.  Eyes: Conjunctivae and EOM are normal. Pupils are equal, round,  and reactive to light.  Neck: Normal range of motion. Neck supple. No thyromegaly present.  Cardiovascular: Normal rate, regular rhythm, normal heart sounds and intact distal pulses.   Pulmonary/Chest: Effort normal and breath sounds normal.  Abdominal: Soft. Bowel sounds are normal. She exhibits no mass. There is no tenderness.  Musculoskeletal: Normal range of motion.  Lymphadenopathy:    She has no cervical adenopathy.  Neurological: She is alert and oriented to person, place, and time.  Skin: Skin is warm and dry. No rash noted.  Psychiatric: She has a normal mood and affect. Her behavior is normal.          Assessment & Plan:     Hypertension.   Well controlled Obesity Allergic rhinitis stable Pain disorder stable History of impaired glucose tolerance. We'll check a random blood sugar  CPX 6 months

## 2011-09-11 NOTE — Patient Instructions (Signed)
Limit your sodium (Salt) intake    It is important that you exercise regularly, at least 20 minutes 3 to 4 times per week.  If you develop chest pain or shortness of breath seek  medical attention.  Please check your blood pressure on a regular basis.  If it is consistently greater than 150/90, please make an office appointment.  Return in 6 months for follow-up   

## 2012-02-04 ENCOUNTER — Encounter: Payer: Self-pay | Admitting: Gastroenterology

## 2012-03-11 ENCOUNTER — Other Ambulatory Visit (INDEPENDENT_AMBULATORY_CARE_PROVIDER_SITE_OTHER): Payer: BC Managed Care – PPO

## 2012-03-11 DIAGNOSIS — Z0000 Encounter for general adult medical examination without abnormal findings: Secondary | ICD-10-CM

## 2012-03-11 LAB — CBC WITH DIFFERENTIAL/PLATELET
Basophils Relative: 0.4 % (ref 0.0–3.0)
Eosinophils Absolute: 0.4 10*3/uL (ref 0.0–0.7)
Eosinophils Relative: 5.9 % — ABNORMAL HIGH (ref 0.0–5.0)
HCT: 41 % (ref 36.0–46.0)
Lymphs Abs: 1.5 10*3/uL (ref 0.7–4.0)
MCHC: 33.1 g/dL (ref 30.0–36.0)
MCV: 96.8 fl (ref 78.0–100.0)
Monocytes Absolute: 0.7 10*3/uL (ref 0.1–1.0)
RBC: 4.24 Mil/uL (ref 3.87–5.11)
WBC: 7.4 10*3/uL (ref 4.5–10.5)

## 2012-03-11 LAB — HEPATIC FUNCTION PANEL
ALT: 31 U/L (ref 0–35)
Albumin: 3.3 g/dL — ABNORMAL LOW (ref 3.5–5.2)
Total Protein: 7 g/dL (ref 6.0–8.3)

## 2012-03-11 LAB — BASIC METABOLIC PANEL
BUN: 20 mg/dL (ref 6–23)
CO2: 30 mEq/L (ref 19–32)
Chloride: 105 mEq/L (ref 96–112)
Creatinine, Ser: 0.8 mg/dL (ref 0.4–1.2)
Potassium: 5.3 mEq/L — ABNORMAL HIGH (ref 3.5–5.1)

## 2012-03-11 LAB — POCT URINALYSIS DIPSTICK
Leukocytes, UA: NEGATIVE
Nitrite, UA: NEGATIVE
Protein, UA: NEGATIVE
Urobilinogen, UA: 0.2
pH, UA: 5.5

## 2012-03-11 LAB — LIPID PANEL
Cholesterol: 189 mg/dL (ref 0–200)
VLDL: 19.8 mg/dL (ref 0.0–40.0)

## 2012-03-11 LAB — TSH: TSH: 1.08 u[IU]/mL (ref 0.35–5.50)

## 2012-03-18 ENCOUNTER — Ambulatory Visit (INDEPENDENT_AMBULATORY_CARE_PROVIDER_SITE_OTHER): Payer: BC Managed Care – PPO | Admitting: Internal Medicine

## 2012-03-18 ENCOUNTER — Encounter: Payer: Self-pay | Admitting: Internal Medicine

## 2012-03-18 VITALS — BP 150/70 | HR 86 | Temp 98.7°F | Resp 20 | Wt 234.0 lb

## 2012-03-18 DIAGNOSIS — E669 Obesity, unspecified: Secondary | ICD-10-CM

## 2012-03-18 DIAGNOSIS — J069 Acute upper respiratory infection, unspecified: Secondary | ICD-10-CM

## 2012-03-18 DIAGNOSIS — Z0000 Encounter for general adult medical examination without abnormal findings: Secondary | ICD-10-CM

## 2012-03-18 DIAGNOSIS — R7302 Impaired glucose tolerance (oral): Secondary | ICD-10-CM

## 2012-03-18 DIAGNOSIS — Z86010 Personal history of colonic polyps: Secondary | ICD-10-CM

## 2012-03-18 DIAGNOSIS — I10 Essential (primary) hypertension: Secondary | ICD-10-CM

## 2012-03-18 DIAGNOSIS — Z23 Encounter for immunization: Secondary | ICD-10-CM

## 2012-03-18 DIAGNOSIS — R7309 Other abnormal glucose: Secondary | ICD-10-CM

## 2012-03-18 MED ORDER — BENAZEPRIL-HYDROCHLOROTHIAZIDE 20-12.5 MG PO TABS: ORAL_TABLET | ORAL | Status: DC

## 2012-03-18 MED ORDER — METOPROLOL SUCCINATE ER 50 MG PO TB24: 50.0000 mg | ORAL_TABLET | Freq: Every day | ORAL | Status: DC

## 2012-03-18 MED ORDER — ALPRAZOLAM 0.25 MG PO TABS: 0.2500 mg | ORAL_TABLET | Freq: Two times a day (BID) | ORAL | Status: DC

## 2012-03-18 MED ORDER — FLUTICASONE PROPIONATE 50 MCG/ACT NA SUSP: 2.0000 | Freq: Every day | NASAL | Status: DC

## 2012-03-18 MED ORDER — HYDROCODONE-HOMATROPINE 5-1.5 MG/5ML PO SYRP: 5.0000 mL | ORAL_SOLUTION | Freq: Four times a day (QID) | ORAL | Status: DC | PRN

## 2012-03-18 MED ORDER — PAROXETINE HCL 40 MG PO TABS: 40.0000 mg | ORAL_TABLET | ORAL | Status: DC

## 2012-03-18 MED ORDER — METHYLPREDNISOLONE ACETATE 80 MG/ML IJ SUSP
80.0000 mg | Freq: Once | INTRAMUSCULAR | Status: AC
  Administered 2012-03-18: 80 mg via INTRAMUSCULAR

## 2012-03-18 NOTE — Progress Notes (Signed)
Subjective:    Patient ID: Glenda [name], female    DOB: 10/30/1950, 61 y.o.   MRN: [id number]  HPI  61 year old patient who is seen today for a wellness exam. She has a history of treated hypertension additionally she has a history of allergic rhinitis exogenous obesity and ongoing tobacco use. She has had a URI for 5 days and has not smoked for the past 6 days. She has impaired glucose tolerance. She also has a history of colonic polyps and is scheduled for followup colonoscopy.   Preventive Screening-Counseling & Management  Alcohol-Tobacco  Smoking Status: current  Smoking Cessation Counseling: yes   Allergies:  1) ! Erythromycin  2) ! Amoxicillin   Past History:  Past Medical History:  Hypertension  Colonic polyps, hx of  tobacco use  history of PSVT  Allergic rhinitis  Anxiety  impaired glucose tolerance  obesity   Past Surgical History:  Reviewed history from 11/24/2006 and no changes required.  G3 P2 A1  Cataract extraction  Ankle sx-left fx  Foot sx  Colonoscopy-04/23/2005   Family History:  Reviewed history from 11/25/2007 and no changes required.  Fam hx Alzheimers  Father died 45, complications of dementia. Mother's in her 76s in good health  one brother died at 4, unclear whether he died as a result of alcohol-like intoxication or possibly a congenital heart disease. Two sisters are well  mother status post CABG   Social History:  Reviewed history from 04/19/2007 and no changes required.    Past Medical History  Diagnosis Date  . Hypertension   . History of colonic polyps   . Tobacco user   . History of PSVT (paroxysmal supraventricular tachycardia)   . Allergic rhinitis   . Impaired glucose tolerance   . Anxiety   . Obesity     History   Social History  . Marital Status: Married    Spouse Name: N/A    Number of Children: N/A  . Years of Education: N/A   Occupational History  . Not on file.   Social History Main Topics  . Smoking  status: Current Every Day Smoker -- 0.5 packs/day    Types: Cigarettes  . Smokeless tobacco: Never Used  . Alcohol Use: Yes  . Drug Use: No  . Sexually Active: Not on file   Other Topics Concern  . Not on file   Social History Narrative  . No narrative on file    Past Surgical History  Procedure Date  . Cataract extraction   . Ankle sx-left fx   . Foot sx   . Colonoscopy 04/23/2005    Family History  Problem Relation Age of Onset  . Healthy Mother   . Dementia Father   . Healthy Sister   . Alzheimer's disease Other   . Healthy Sister     Allergies  Allergen Reactions  . Amoxicillin     REACTION: Rash  . Erythromycin     REACTION: Upset stomach    Current Outpatient Prescriptions on File Prior to Visit  Medication Sig Dispense Refill  . ALPRAZolam (XANAX) 0.25 MG tablet Take 1 tablet (0.25 mg total) by mouth 2 (two) times daily.  60 tablet  4  . aspirin (BAYER LOW STRENGTH) 81 MG EC tablet Take 81 mg by mouth daily.        . benazepril-hydrochlorthiazide (LOTENSIN HCT) 20-12.5 MG per tablet Take 1/2 tablet once daily.  90 tablet  6  . fluticasone (FLONASE) 50 MCG/ACT nasal spray Place  2 sprays into the nose daily.  16 g  6  . metoprolol (TOPROL-XL) 50 MG 24 hr tablet Take 1 tablet (50 mg total) by mouth daily.  90 tablet  6  . PARoxetine (PAXIL) 40 MG tablet Take 1 tablet (40 mg total) by mouth every morning.  90 tablet  6  . triamcinolone (KENALOG) 0.1 % cream Apply 1 application topically 2 (two) times daily.  30 g  2    BP 150/70  Pulse 86  Temp 98.7 F (37.1 C) (Oral)  Resp 20  Wt 234 lb (106.142 kg)  SpO2 91%       Review of Systems  Constitutional: Negative for fever, appetite change, fatigue and unexpected weight change.  HENT: Positive for congestion and rhinorrhea. Negative for hearing loss, ear pain, nosebleeds, sore throat, mouth sores, trouble swallowing, neck stiffness, dental problem, voice change, sinus pressure and tinnitus.   Eyes:  Negative for photophobia, pain, redness and visual disturbance.  Respiratory: Positive for cough and shortness of breath. Negative for chest tightness.   Cardiovascular: Negative for chest pain, palpitations and leg swelling.  Gastrointestinal: Negative for nausea, vomiting, abdominal pain, diarrhea, constipation, blood in stool, abdominal distention and rectal pain.  Genitourinary: Negative for dysuria, urgency, frequency, hematuria, flank pain, vaginal bleeding, vaginal discharge, difficulty urinating, genital sores, vaginal pain, menstrual problem and pelvic pain.  Musculoskeletal: Negative for back pain and arthralgias.  Skin: Negative for rash.  Neurological: Positive for weakness. Negative for dizziness, syncope, speech difficulty, light-headedness, numbness and headaches.  Hematological: Negative for adenopathy. Does not bruise/bleed easily.  Psychiatric/Behavioral: Negative for suicidal ideas, behavioral problems, self-injury, dysphoric mood and agitation. The patient is not nervous/anxious.        Objective:   Physical Exam  Constitutional: She is oriented to person, place, and time. She appears well-developed and well-nourished. No distress.       Frequent paroxysms of coughing. Blood pressure normal  HENT:  Head: Normocephalic and atraumatic.  Right Ear: External ear normal.  Left Ear: External ear normal.  Mouth/Throat: Oropharynx is clear and moist.  Eyes: Conjunctivae normal and EOM are normal.  Neck: Normal range of motion. Neck supple. No JVD present. No thyromegaly present.  Cardiovascular: Normal rate, regular rhythm, normal heart sounds and intact distal pulses.   No murmur heard. Pulmonary/Chest: Effort normal and breath sounds normal. She has no wheezes. She has no rales.       O2 saturation 93%. Pulse rate 82  Few scattered rhonchi. No active wheezing  Abdominal: Soft. Bowel sounds are normal. She exhibits no distension and no mass. There is no tenderness. There is  no rebound and no guarding.  Musculoskeletal: Normal range of motion. She exhibits no edema and no tenderness.  Neurological: She is alert and oriented to person, place, and time. She has normal reflexes. No cranial nerve deficit. She exhibits normal muscle tone. Coordination normal.  Skin: Skin is warm and dry. No rash noted.  Psychiatric: She has a normal mood and affect. Her behavior is normal.          Assessment & Plan:   Preventive health examination Hypertension stable Viral bronchitis Exogenous obesity Impaired glucose tolerance Allergic rhinitis Ongoing tobacco use Anxiety disorder History of colonic polyps   Laboratory studies reviewed Exercise weight loss encouraged Smoking cessation encouraged Followup colonoscopy recommended  Recheck 6 months

## 2012-03-18 NOTE — Patient Instructions (Signed)
Limit your sodium (Salt) intake    It is important that you exercise regularly, at least 20 minutes 3 to 4 times per week.  If you develop chest pain or shortness of breath seek  medical attention.  You need to lose weight.  Consider a lower calorie diet and regular exercise.  Take a calcium supplement, plus [id number] units of vitamin D  Schedule your colonoscopy to help detect colon cancer.  Smoking tobacco is very bad for your health. You should stop smoking immediately.  Return in 6 months for follow-up

## 2012-03-21 ENCOUNTER — Telehealth: Payer: Self-pay | Admitting: Internal Medicine

## 2012-03-21 NOTE — Telephone Encounter (Signed)
Caller: Elnora/Patient; Patient Name: Glenda [name]; PCP: Eleonore Chiquito [facility]); Best Callback Phone Number: [phone]; Reason for call: Cough/Congestion.  Pt was seen in the office on 03/18/12 and diagnosed with Bronchitis.  Pt states that she is worried she may have whooping cough, due to the sound of her cough.  Pt does report she has been vaccinated for pertussis.  Pt states she has a 65 week old grandchild that she is worried about being around.  Pt states she is now running a low grade fever (100) and her cough is now productive with green mucous.  Triaged patient per Cough - Adult Pediatric Protocol.  See Provider within 24 Hours Disposition for 'Productive cough with colored sputum'. Care advice given per protocol.  Appt scheduled for 115/13 at 0915 with Dr Kirtland Bouchard.

## 2012-03-22 ENCOUNTER — Encounter: Payer: Self-pay | Admitting: Internal Medicine

## 2012-03-22 ENCOUNTER — Ambulatory Visit (INDEPENDENT_AMBULATORY_CARE_PROVIDER_SITE_OTHER): Payer: BC Managed Care – PPO | Admitting: Internal Medicine

## 2012-03-22 VITALS — BP 150/90 | Temp 97.6°F

## 2012-03-22 DIAGNOSIS — J069 Acute upper respiratory infection, unspecified: Secondary | ICD-10-CM

## 2012-03-22 DIAGNOSIS — I10 Essential (primary) hypertension: Secondary | ICD-10-CM

## 2012-03-22 DIAGNOSIS — J309 Allergic rhinitis, unspecified: Secondary | ICD-10-CM

## 2012-03-22 MED ORDER — HYDROCODONE-HOMATROPINE 5-1.5 MG/5ML PO SYRP: 5.0000 mL | ORAL_SOLUTION | Freq: Four times a day (QID) | ORAL | Status: AC | PRN

## 2012-03-22 NOTE — Patient Instructions (Signed)
Take over-the-counter expectorants and cough medications such as  Mucinex DM.  Call if there is no improvement in 5 to 7 days or if he developed worsening cough, fever, or new symptoms, such as shortness of breath or chest pain. 

## 2012-03-22 NOTE — Progress Notes (Signed)
Subjective:    Patient ID: Glenda [name], female    DOB: Sep 20, 1950, 61 y.o.   MRN: [id number]  HPI  61 year old patient who has a history of allergic rhinitis and tobacco use. She was seen 4 days ago for a preventive health examination and presented at that time with a four-day history of URI symptoms. She continues to have worsening hoarseness and largely nonproductive cough. Remains abstinent from tobacco use. No fever or sputum production. No wheezing or shortness of breath. Past Medical History  Diagnosis Date  . Hypertension   . History of colonic polyps   . Tobacco user   . History of PSVT (paroxysmal supraventricular tachycardia)   . Allergic rhinitis   . Impaired glucose tolerance   . Anxiety   . Obesity     History   Social History  . Marital Status: Married    Spouse Name: N/A    Number of Children: N/A  . Years of Education: N/A   Occupational History  . Not on file.   Social History Main Topics  . Smoking status: Current Every Day Smoker -- 0.5 packs/day    Types: Cigarettes  . Smokeless tobacco: Never Used  . Alcohol Use: Yes  . Drug Use: No  . Sexually Active: Not on file   Other Topics Concern  . Not on file   Social History Narrative  . No narrative on file    Past Surgical History  Procedure Date  . Cataract extraction   . Ankle sx-left fx   . Foot sx   . Colonoscopy 04/23/2005    Family History  Problem Relation Age of Onset  . Healthy Mother   . Dementia Father   . Healthy Sister   . Alzheimer's disease Other   . Healthy Sister     Allergies  Allergen Reactions  . Amoxicillin     REACTION: Rash  . Erythromycin     REACTION: Upset stomach    Current Outpatient Prescriptions on File Prior to Visit  Medication Sig Dispense Refill  . ALPRAZolam (XANAX) 0.25 MG tablet Take 1 tablet (0.25 mg total) by mouth 2 (two) times daily.  60 tablet  4  . aspirin (BAYER LOW STRENGTH) 81 MG EC tablet Take 81 mg by mouth daily.        .  benazepril-hydrochlorthiazide (LOTENSIN HCT) 20-12.5 MG per tablet Take 1/2 tablet once daily.  90 tablet  6  . fluticasone (FLONASE) 50 MCG/ACT nasal spray Place 2 sprays into the nose daily.  16 g  6  . HYDROcodone-homatropine (HYCODAN) 5-1.5 MG/5ML syrup Take 5 mLs by mouth every 6 (six) hours as needed for cough.  120 mL  0  . metoprolol succinate (TOPROL-XL) 50 MG 24 hr tablet Take 1 tablet (50 mg total) by mouth daily.  90 tablet  6  . PARoxetine (PAXIL) 40 MG tablet Take 1 tablet (40 mg total) by mouth every morning.  90 tablet  6  . triamcinolone (KENALOG) 0.1 % cream Apply 1 application topically 2 (two) times daily.  30 g  2    BP 150/90  Temp 97.6 F (36.4 C) (Oral)  SpO2 96%        Review of Systems  Constitutional: Negative.   HENT: Positive for congestion. Negative for hearing loss, sore throat, rhinorrhea, dental problem, sinus pressure and tinnitus.   Eyes: Negative for pain, discharge and visual disturbance.  Respiratory: Positive for cough. Negative for shortness of breath.   Cardiovascular: Negative for chest  pain, palpitations and leg swelling.  Gastrointestinal: Negative for nausea, vomiting, abdominal pain, diarrhea, constipation, blood in stool and abdominal distention.  Genitourinary: Negative for dysuria, urgency, frequency, hematuria, flank pain, vaginal bleeding, vaginal discharge, difficulty urinating, vaginal pain and pelvic pain.  Musculoskeletal: Negative for joint swelling, arthralgias and gait problem.  Skin: Negative for rash.  Neurological: Negative for dizziness, syncope, speech difficulty, weakness, numbness and headaches.  Hematological: Negative for adenopathy.  Psychiatric/Behavioral: Negative for behavioral problems, dysphoric mood and agitation. The patient is not nervous/anxious.        Objective:   Physical Exam  Constitutional: She is oriented to person, place, and time. She appears well-developed and well-nourished. No distress.        Frequent paroxysms of coughing  Hoarseness  HENT:  Head: Normocephalic.  Right Ear: External ear normal.  Left Ear: External ear normal.  Mouth/Throat: Oropharynx is clear and moist.  Eyes: Conjunctivae normal and EOM are normal. Pupils are equal, round, and reactive to light.  Neck: Normal range of motion. Neck supple. No thyromegaly present.  Cardiovascular: Normal rate, regular rhythm, normal heart sounds and intact distal pulses.   Pulmonary/Chest: Effort normal and breath sounds normal.       O2 saturation 96  Abdominal: Soft. Bowel sounds are normal. She exhibits no mass. There is no tenderness.  Musculoskeletal: Normal range of motion.  Lymphadenopathy:    She has no cervical adenopathy.  Neurological: She is alert and oriented to person, place, and time.  Skin: Skin is warm and dry. No rash noted.  Psychiatric: She has a normal mood and affect. Her behavior is normal.          Assessment & Plan:  Lower URI with cough. We'll continue cough medicine. Add  Delsym.

## 2012-05-08 ENCOUNTER — Other Ambulatory Visit: Payer: Self-pay | Admitting: Internal Medicine

## 2012-09-16 ENCOUNTER — Ambulatory Visit: Payer: BC Managed Care – PPO | Admitting: Internal Medicine

## 2012-09-23 ENCOUNTER — Ambulatory Visit (INDEPENDENT_AMBULATORY_CARE_PROVIDER_SITE_OTHER): Payer: BC Managed Care – PPO | Admitting: Internal Medicine

## 2012-09-23 ENCOUNTER — Encounter: Payer: Self-pay | Admitting: Internal Medicine

## 2012-09-23 VITALS — BP 124/80 | HR 75 | Temp 98.8°F | Resp 20 | Wt 242.0 lb

## 2012-09-23 DIAGNOSIS — I10 Essential (primary) hypertension: Secondary | ICD-10-CM

## 2012-09-23 DIAGNOSIS — E669 Obesity, unspecified: Secondary | ICD-10-CM

## 2012-09-23 NOTE — Patient Instructions (Signed)
Limit your sodium (Salt) intake  You need to lose weight.  Consider a lower calorie diet and regular   Please check your blood pressure on a regular basis.  If it is consistently greater than 150/90, please make an office appointment.

## 2012-09-23 NOTE — Progress Notes (Signed)
Subjective:    Patient ID: Glenda [name], female    DOB: 04-25-1951, 62 y.o.   MRN: [id number]  HPI  62 year old patient who is seen today for her biannual followup. She has a history of hypertension. She also has a history of tobacco use. She now smokes only rarely. There's been some modest weight gain since discontinuation of chronic tobacco use. She has a history of SVT which has been stable. She has history also mild anxiety and panic disorder which has been stable. She uses very little if any alprazolam. Blood pressure has been well-controlled  Wt Readings from Last 3 Encounters:  09/23/12 242 lb (109.77 kg)  03/18/12 234 lb (106.142 kg)  09/11/11 235 lb (106.595 kg)    Past Medical History  Diagnosis Date  . Hypertension   . History of colonic polyps   . Tobacco user   . History of PSVT (paroxysmal supraventricular tachycardia)   . Allergic rhinitis   . Impaired glucose tolerance   . Anxiety   . Obesity     History   Social History  . Marital Status: Married    Spouse Name: N/A    Number of Children: N/A  . Years of Education: N/A   Occupational History  . Not on file.   Social History Main Topics  . Smoking status: Current Some Day Smoker -- 0.50 packs/day    Types: Cigarettes  . Smokeless tobacco: Never Used  . Alcohol Use: Yes  . Drug Use: No  . Sexually Active: Not on file   Other Topics Concern  . Not on file   Social History Narrative  . No narrative on file    Past Surgical History  Procedure Laterality Date  . Cataract extraction    . Ankle sx-left fx    . Foot sx    . Colonoscopy  04/23/2005    Family History  Problem Relation Age of Onset  . Healthy Mother   . Dementia Father   . Healthy Sister   . Alzheimer's disease Other   . Healthy Sister     Allergies  Allergen Reactions  . Amoxicillin     REACTION: Rash  . Erythromycin     REACTION: Upset stomach    Current Outpatient Prescriptions on File Prior to Visit  Medication Sig  Dispense Refill  . ALPRAZolam (XANAX) 0.25 MG tablet Take 1 tablet (0.25 mg total) by mouth 2 (two) times daily.  60 tablet  4  . aspirin (BAYER LOW STRENGTH) 81 MG EC tablet Take 81 mg by mouth daily.        . benazepril-hydrochlorthiazide (LOTENSIN HCT) 20-12.5 MG per tablet Take 1/2 tablet once daily.  90 tablet  6  . fluticasone (FLONASE) 50 MCG/ACT nasal spray Place 2 sprays into the nose daily.  16 g  6  . metoprolol succinate (TOPROL-XL) 50 MG 24 hr tablet Take 1 tablet (50 mg total) by mouth daily.  90 tablet  6  . PARoxetine (PAXIL) 40 MG tablet Take 1 tablet (40 mg total) by mouth every morning.  90 tablet  6  . triamcinolone (KENALOG) 0.1 % cream Apply 1 application topically 2 (two) times daily.  30 g  2   No current facility-administered medications on file prior to visit.    BP 124/80  Pulse 75  Temp(Src) 98.8 F (37.1 C) (Oral)  Resp 20  Wt 242 lb (109.77 kg)  BMI 37.89 kg/m2  SpO2 98%     Review of Systems  Constitutional: Positive for unexpected weight change.  HENT: Negative for hearing loss, congestion, sore throat, rhinorrhea, dental problem, sinus pressure and tinnitus.   Eyes: Negative for pain, discharge and visual disturbance.  Respiratory: Negative for cough and shortness of breath.   Cardiovascular: Negative for chest pain, palpitations and leg swelling.  Gastrointestinal: Negative for nausea, vomiting, abdominal pain, diarrhea, constipation, blood in stool and abdominal distention.  Genitourinary: Negative for dysuria, urgency, frequency, hematuria, flank pain, vaginal bleeding, vaginal discharge, difficulty urinating, vaginal pain and pelvic pain.  Musculoskeletal: Negative for joint swelling, arthralgias and gait problem.  Skin: Negative for rash.  Neurological: Negative for dizziness, syncope, speech difficulty, weakness, numbness and headaches.  Hematological: Negative for adenopathy.  Psychiatric/Behavioral: Negative for behavioral problems,  dysphoric mood and agitation. The patient is not nervous/anxious.        Objective:   Physical Exam  Constitutional: She is oriented to person, place, and time. She appears well-developed and well-nourished.  HENT:  Head: Normocephalic.  Right Ear: External ear normal.  Left Ear: External ear normal.  Mouth/Throat: Oropharynx is clear and moist.  Eyes: Conjunctivae and EOM are normal. Pupils are equal, round, and reactive to light.  Neck: Normal range of motion. Neck supple. No thyromegaly present.  Cardiovascular: Normal rate, regular rhythm, normal heart sounds and intact distal pulses.   Pulmonary/Chest: Effort normal and breath sounds normal.  Abdominal: Soft. Bowel sounds are normal. She exhibits no mass. There is no tenderness.  Musculoskeletal: Normal range of motion.  Lymphadenopathy:    She has no cervical adenopathy.  Neurological: She is alert and oriented to person, place, and time.  Skin: Skin is warm and dry. No rash noted.  Psychiatric: She has a normal mood and affect. Her behavior is normal.          Assessment & Plan:  Hypertension stable Obesity. Weight loss encouraged  CPX 6 months All medications refilled

## 2012-12-08 ENCOUNTER — Telehealth: Payer: Self-pay | Admitting: Internal Medicine

## 2012-12-08 ENCOUNTER — Ambulatory Visit (INDEPENDENT_AMBULATORY_CARE_PROVIDER_SITE_OTHER): Payer: BC Managed Care – PPO | Admitting: Family Medicine

## 2012-12-08 ENCOUNTER — Encounter: Payer: Self-pay | Admitting: Family Medicine

## 2012-12-08 VITALS — BP 128/80 | Temp 98.3°F | Wt 244.0 lb

## 2012-12-08 DIAGNOSIS — M25519 Pain in unspecified shoulder: Secondary | ICD-10-CM

## 2012-12-08 DIAGNOSIS — M25511 Pain in right shoulder: Secondary | ICD-10-CM

## 2012-12-08 MED ORDER — TRAMADOL HCL 50 MG PO TABS: 50.0000 mg | ORAL_TABLET | Freq: Three times a day (TID) | ORAL | Status: DC | PRN

## 2012-12-08 MED ORDER — CYCLOBENZAPRINE HCL 5 MG PO TABS: 5.0000 mg | ORAL_TABLET | Freq: Three times a day (TID) | ORAL | Status: DC | PRN

## 2012-12-08 NOTE — Progress Notes (Signed)
Chief Complaint  Patient presents with  . pinched nerve in right shouler    pain down right arm x 4 to 5 days     HPI:  R shoulder and arm pain: -started last week after pulling weeds for a few hours -pain is in R shoulder and R upper arm, felt like tight muscle, started in shoulder, pain is achy/squeezing, constant -worse with pick up up things and lying on this arm -better with rest, ibuprofen -denies: fevers, malaise, weakness or numbness -had similar thing about 20 years ago and had weakness and strength issues at the time and told radiculopathy - resolve don its own  ROS: See pertinent positives and negatives per HPI.  Past Medical History  Diagnosis Date  . Hypertension   . History of colonic polyps   . Tobacco user   . History of PSVT (paroxysmal supraventricular tachycardia)   . Allergic rhinitis   . Impaired glucose tolerance   . Anxiety   . Obesity     Family History  Problem Relation Age of Onset  . Healthy Mother   . Dementia Father   . Healthy Sister   . Alzheimer's disease Other   . Healthy Sister     History   Social History  . Marital Status: Married    Spouse Name: N/A    Number of Children: N/A  . Years of Education: N/A   Social History Main Topics  . Smoking status: Current Some Day Smoker -- 0.50 packs/day    Types: Cigarettes  . Smokeless tobacco: Never Used  . Alcohol Use: Yes  . Drug Use: No  . Sexually Active: None   Other Topics Concern  . None   Social History Narrative  . None    Current outpatient prescriptions:ALPRAZolam (XANAX) 0.25 MG tablet, Take 1 tablet (0.25 mg total) by mouth 2 (two) times daily., Disp: 60 tablet, Rfl: 4;  aspirin (BAYER LOW STRENGTH) 81 MG EC tablet, Take 81 mg by mouth daily.  , Disp: , Rfl: ;  benazepril-hydrochlorthiazide (LOTENSIN HCT) 20-12.5 MG per tablet, Take 1/2 tablet once daily., Disp: 90 tablet, Rfl: 6 fluticasone (FLONASE) 50 MCG/ACT nasal spray, Place 2 sprays into the nose daily.,  Disp: 16 g, Rfl: 6;  metoprolol succinate (TOPROL-XL) 50 MG 24 hr tablet, Take 1 tablet (50 mg total) by mouth daily., Disp: 90 tablet, Rfl: 6;  PARoxetine (PAXIL) 40 MG tablet, Take 1 tablet (40 mg total) by mouth every morning., Disp: 90 tablet, Rfl: 6;  triamcinolone (KENALOG) 0.1 % cream, Apply 1 application topically 2 (two) times daily., Disp: 30 g, Rfl: 2 cyclobenzaprine (FLEXERIL) 5 MG tablet, Take 1 tablet (5 mg total) by mouth 3 (three) times daily as needed for muscle spasms., Disp: 15 tablet, Rfl: 0;  traMADol (ULTRAM) 50 MG tablet, Take 1 tablet (50 mg total) by mouth every 8 (eight) hours as needed for pain., Disp: 30 tablet, Rfl: 0  EXAM:  Filed Vitals:   12/08/12 0955  BP: 128/80  Temp: 98.3 F (36.8 C)    Body mass index is 38.21 kg/(m^2).  GENERAL: vitals reviewed and listed above, alert, oriented, appears well hydrated and in no acute distress  HEENT: atraumatic, conjunttiva clear, no obvious abnormalities on inspection of external nose and ears  NECK: no obvious masses on inspection, normal ROM, no bony TTP - see below  MS: moves all extremities without noticeable abnormality -normal ROM/muscle stength in neck and UE bilat with normal DTRs and sensation to light tough, she  does have some pain in R neck and R upper shoulder with spurling - no radiation to arm with this, TTP supraspinatus muscle and attachment to humerous on R with + empty can on R and some pain with ext rotation of shoulder against resistant, neg neer, neg shawl, neg speeds, normal distal pulses  PSYCH: pleasant and cooperative, no obvious depression or anxiety  ASSESSMENT AND PLAN:  Discussed the following assessment and plan:  Pain in joint, shoulder region, right - Plan: traMADol (ULTRAM) 50 MG tablet, cyclobenzaprine (FLEXERIL) 5 MG tablet  -likely some underlying cervical DD with possible mild radiculopathy though most findings on exam suggest supraspinatus pathology as cause of symptoms. Tx per  recs and orders with follow up in 3-4 weeks or sooner if worsening. -Patient advised to return or notify a doctor immediately if symptoms worsen or persist or new concerns arise.  Patient Instructions  -heat for 15 minutes twice daily  -can use ibuprofen 400-600mg  twice daily or tylenol 500-1000mg  twice daily for pain; if this is not working can try the tramadol if needed per instructions  -flexeril nightly for next 3-5 nights   -Exercises provided 5-7 days per week - circled for first week, then add other exercises  -follow up with your doctor in 3-4 weeks or sooner if worsening or other concerns     KIM, Dahlia Client R.

## 2012-12-08 NOTE — Patient Instructions (Addendum)
-  heat for 15 minutes twice daily  -can use ibuprofen 400-600mg  twice daily or tylenol 500-1000mg  twice daily for pain; if this is not working can try the tramadol if needed per instructions  -flexeril nightly for next 3-5 nights   -Exercises provided 5-7 days per week - circled for first week, then add other exercises  -follow up with your doctor in 3-4 weeks or sooner if worsening or other concerns

## 2012-12-20 NOTE — Telephone Encounter (Signed)
Close encounter 

## 2013-01-09 ENCOUNTER — Ambulatory Visit: Payer: BC Managed Care – PPO | Admitting: Internal Medicine

## 2013-03-22 ENCOUNTER — Other Ambulatory Visit: Payer: Self-pay | Admitting: Internal Medicine

## 2013-03-23 ENCOUNTER — Other Ambulatory Visit: Payer: Self-pay

## 2013-03-24 ENCOUNTER — Other Ambulatory Visit (INDEPENDENT_AMBULATORY_CARE_PROVIDER_SITE_OTHER): Payer: BC Managed Care – PPO

## 2013-03-24 DIAGNOSIS — Z0000 Encounter for general adult medical examination without abnormal findings: Secondary | ICD-10-CM

## 2013-03-24 LAB — CBC WITH DIFFERENTIAL/PLATELET
Basophils Absolute: 0 10*3/uL (ref 0.0–0.1)
Basophils Relative: 0.5 % (ref 0.0–3.0)
Eosinophils Absolute: 0.1 10*3/uL (ref 0.0–0.7)
HCT: 43.1 % (ref 36.0–46.0)
Lymphocytes Relative: 21.2 % (ref 12.0–46.0)
MCHC: 33.6 g/dL (ref 30.0–36.0)
Neutro Abs: 4.3 10*3/uL (ref 1.4–7.7)
Neutrophils Relative %: 68 % (ref 43.0–77.0)
Platelets: 314 10*3/uL (ref 150.0–400.0)
RBC: 4.57 Mil/uL (ref 3.87–5.11)
RDW: 13.6 % (ref 11.5–14.6)

## 2013-03-24 LAB — POCT URINALYSIS DIPSTICK
Bilirubin, UA: NEGATIVE
Blood, UA: NEGATIVE
Glucose, UA: NEGATIVE
Ketones, UA: NEGATIVE
Protein, UA: NEGATIVE
Spec Grav, UA: 1.02

## 2013-03-24 LAB — BASIC METABOLIC PANEL
BUN: 20 mg/dL (ref 6–23)
CO2: 29 mEq/L (ref 19–32)
Calcium: 9.9 mg/dL (ref 8.4–10.5)
Creatinine, Ser: 0.8 mg/dL (ref 0.4–1.2)
Glucose, Bld: 121 mg/dL — ABNORMAL HIGH (ref 70–99)
Potassium: 5.8 mEq/L — ABNORMAL HIGH (ref 3.5–5.1)

## 2013-03-24 LAB — LIPID PANEL
Cholesterol: 200 mg/dL (ref 0–200)
HDL: 33.3 mg/dL — ABNORMAL LOW (ref >39.00)
LDL Cholesterol: 150 mg/dL — ABNORMAL HIGH (ref 0–99)
Triglycerides: 86 mg/dL (ref 0.0–149.0)
VLDL: 17.2 mg/dL (ref 0.0–40.0)

## 2013-03-24 LAB — HEPATIC FUNCTION PANEL
Alkaline Phosphatase: 71 U/L (ref 39–117)
Bilirubin, Direct: 0 mg/dL (ref 0.0–0.3)
Total Bilirubin: 0.8 mg/dL (ref 0.3–1.2)
Total Protein: 7.5 g/dL (ref 6.0–8.3)

## 2013-03-30 ENCOUNTER — Encounter: Payer: Self-pay | Admitting: Internal Medicine

## 2013-03-31 ENCOUNTER — Ambulatory Visit (INDEPENDENT_AMBULATORY_CARE_PROVIDER_SITE_OTHER): Payer: BC Managed Care – PPO | Admitting: Internal Medicine

## 2013-03-31 ENCOUNTER — Encounter: Payer: Self-pay | Admitting: Internal Medicine

## 2013-03-31 VITALS — BP 110/70 | HR 88 | Temp 98.2°F | Ht 67.0 in | Wt 228.0 lb

## 2013-03-31 DIAGNOSIS — Z0000 Encounter for general adult medical examination without abnormal findings: Secondary | ICD-10-CM

## 2013-03-31 DIAGNOSIS — Z23 Encounter for immunization: Secondary | ICD-10-CM

## 2013-03-31 MED ORDER — ALPRAZOLAM 0.25 MG PO TABS: 0.2500 mg | ORAL_TABLET | Freq: Two times a day (BID) | ORAL | Status: DC

## 2013-03-31 NOTE — Progress Notes (Signed)
Subjective:    Patient ID: Glenda [name], female    DOB: 21-Jan-1951, 62 y.o.   MRN: [id number]  HPI 62 year-old patient seen today for annual exam. She has a history of anxiety disorder hypertension impaired glucose tolerance. She has done quite well. No concerns or complaints. She also has allergic rhinitis which has been stable. She also has a history of colonic polyps and is overdue for followup colonoscopy.  Family history mother age 57 and is remarkably well status post CABG. Father died at 35 of advanced senile dementia. One brother died at age 23 and may have had congenital heart disease. 2 sisters are well.  Past Medical History  Diagnosis Date  . Hypertension   . History of colonic polyps   . Tobacco user   . History of PSVT (paroxysmal supraventricular tachycardia)   . Allergic rhinitis   . Impaired glucose tolerance   . Anxiety   . Obesity     History   Social History  . Marital Status: Married    Spouse Name: N/A    Number of Children: N/A  . Years of Education: N/A   Occupational History  . Not on file.   Social History Main Topics  . Smoking status: Current Some Day Smoker -- 0.50 packs/day    Types: Cigarettes  . Smokeless tobacco: Never Used  . Alcohol Use: Yes  . Drug Use: No  . Sexual Activity: Not on file   Other Topics Concern  . Not on file   Social History Narrative  . No narrative on file    Past Surgical History  Procedure Laterality Date  . Cataract extraction    . Ankle sx-left fx    . Foot sx    . Colonoscopy  04/23/2005    Family History  Problem Relation Age of Onset  . Healthy Mother   . Dementia Father   . Healthy Sister   . Alzheimer's disease Other   . Healthy Sister     Allergies  Allergen Reactions  . Amoxicillin     REACTION: Rash  . Erythromycin     REACTION: Upset stomach    Current Outpatient Prescriptions on File Prior to Visit  Medication Sig Dispense Refill  . ALPRAZolam (XANAX) 0.25 MG tablet Take 1  tablet (0.25 mg total) by mouth 2 (two) times daily.  60 tablet  4  . aspirin (BAYER LOW STRENGTH) 81 MG EC tablet Take 81 mg by mouth daily.        . benazepril-hydrochlorthiazide (LOTENSIN HCT) 20-12.5 MG per tablet Take 1/2 tablet once daily.  90 tablet  6  . fluticasone (FLONASE) 50 MCG/ACT nasal spray Place 2 sprays into the nose daily.  16 g  6  . metoprolol succinate (TOPROL-XL) 50 MG 24 hr tablet Take one tablet by mouth one time daily  90 tablet  3  . PARoxetine (PAXIL) 40 MG tablet Take 1 tablet (40 mg total) by mouth every morning.  90 tablet  6  . triamcinolone (KENALOG) 0.1 % cream Apply 1 application topically 2 (two) times daily.  30 g  2   No current facility-administered medications on file prior to visit.    BP 110/70  Pulse 88  Temp(Src) 98.2 F (36.8 C) (Oral)  Ht 5\' 7"  (1.702 m)  Wt 228 lb (103.42 kg)  BMI 35.70 kg/m2      Review of Systems  Constitutional: Negative.   HENT: Negative for congestion, dental problem, hearing loss, rhinorrhea, sinus pressure,  sore throat and tinnitus.   Eyes: Negative for pain, discharge and visual disturbance.  Respiratory: Negative for cough and shortness of breath.   Cardiovascular: Negative for chest pain, palpitations and leg swelling.  Gastrointestinal: Negative for nausea, vomiting, abdominal pain, diarrhea, constipation, blood in stool and abdominal distention.  Genitourinary: Negative for dysuria, urgency, frequency, hematuria, flank pain, vaginal bleeding, vaginal discharge, difficulty urinating, vaginal pain and pelvic pain.  Musculoskeletal: Negative for arthralgias, gait problem and joint swelling.  Skin: Negative for rash.  Neurological: Negative for dizziness, syncope, speech difficulty, weakness, numbness and headaches.  Hematological: Negative for adenopathy.  Psychiatric/Behavioral: Negative for behavioral problems, dysphoric mood and agitation. The patient is not nervous/anxious.        Objective:    Physical Exam  Constitutional: She is oriented to person, place, and time. She appears well-developed and well-nourished.  HENT:  Head: Normocephalic.  Right Ear: External ear normal.  Left Ear: External ear normal.  Mouth/Throat: Oropharynx is clear and moist.  Eyes: Conjunctivae and EOM are normal. Pupils are equal, round, and reactive to light.  Neck: Normal range of motion. Neck supple. No thyromegaly present.  Cardiovascular: Normal rate, regular rhythm, normal heart sounds and intact distal pulses.   Pulmonary/Chest: Effort normal and breath sounds normal.  Abdominal: Soft. Bowel sounds are normal. She exhibits no mass. There is no tenderness.  Musculoskeletal: Normal range of motion.  Lymphadenopathy:    She has no cervical adenopathy.  Neurological: She is alert and oriented to person, place, and time.  Skin: Skin is warm and dry. No rash noted.  Psychiatric: She has a normal mood and affect. Her behavior is normal.          Assessment & Plan:   Hypertension well controlled Allergic rhinitis stable Exogenous obesity and history of impaired glucose tolerance  We'll reassess in one year Weight loss exercise encouraged Followup colonoscopy

## 2013-03-31 NOTE — Patient Instructions (Signed)
It is important that you exercise regularly, at least 20 minutes 3 to 4 times per week.  If you develop chest pain or shortness of breath seek  medical attention.  Please check your blood pressure on a regular basis.  If it is consistently greater than 150/90, please make an office appointment.  You need to lose weight.  Consider a lower calorie diet and regular exercise.  Schedule your colonoscopy to help detect colon cancer.  Return in one year for follow-up

## 2013-04-03 ENCOUNTER — Encounter: Payer: Self-pay | Admitting: Gastroenterology

## 2013-04-03 ENCOUNTER — Other Ambulatory Visit: Payer: Self-pay | Admitting: Internal Medicine

## 2013-04-15 ENCOUNTER — Encounter: Payer: Self-pay | Admitting: Internal Medicine

## 2013-04-17 ENCOUNTER — Encounter: Payer: Self-pay | Admitting: Family Medicine

## 2013-04-17 ENCOUNTER — Ambulatory Visit (INDEPENDENT_AMBULATORY_CARE_PROVIDER_SITE_OTHER): Payer: BC Managed Care – PPO | Admitting: Family Medicine

## 2013-04-17 VITALS — BP 104/70 | HR 69 | Temp 98.5°F | Wt 229.0 lb

## 2013-04-17 DIAGNOSIS — J069 Acute upper respiratory infection, unspecified: Secondary | ICD-10-CM

## 2013-04-17 DIAGNOSIS — H10023 Other mucopurulent conjunctivitis, bilateral: Secondary | ICD-10-CM

## 2013-04-17 DIAGNOSIS — H10029 Other mucopurulent conjunctivitis, unspecified eye: Secondary | ICD-10-CM

## 2013-04-17 MED ORDER — SULFACETAMIDE SODIUM 10 % OP SOLN: 1.0000 [drp] | OPHTHALMIC | Status: DC

## 2013-04-17 NOTE — Progress Notes (Signed)
Chief Complaint  Patient presents with  . Cough    congestion, headache, laryngitis, pink eye     HPI:  Glenda [name], a 62 yo pt of Dr. Amador Cunas, is here for an acute visit for:  ? Pink Eye: -started 2 days ago - irritated eyes with a little redness and drainage -also has had URI: nasal congestion, cough, sinus pressure, ear pain, laryngitis - improving -denies: vision loss, fevers, eye pain, SOB  ROS: See pertinent positives and negatives per HPI.  Past Medical History  Diagnosis Date  . Hypertension   . History of colonic polyps   . Tobacco user   . History of PSVT (paroxysmal supraventricular tachycardia)   . Allergic rhinitis   . Impaired glucose tolerance   . Anxiety   . Obesity     Past Surgical History  Procedure Laterality Date  . Cataract extraction    . Ankle sx-left fx    . Foot sx    . Colonoscopy  04/23/2005    Family History  Problem Relation Age of Onset  . Healthy Mother   . Dementia Father   . Healthy Sister   . Alzheimer's disease Other   . Healthy Sister     History   Social History  . Marital Status: Married    Spouse Name: N/A    Number of Children: N/A  . Years of Education: N/A   Social History Main Topics  . Smoking status: Current Some Day Smoker -- 0.50 packs/day    Types: Cigarettes  . Smokeless tobacco: Never Used  . Alcohol Use: Yes  . Drug Use: No  . Sexual Activity: None   Other Topics Concern  . None   Social History Narrative  . None    Current outpatient prescriptions:ALPRAZolam (XANAX) 0.25 MG tablet, Take 1 tablet (0.25 mg total) by mouth 2 (two) times daily., Disp: 60 tablet, Rfl: 4;  aspirin (BAYER LOW STRENGTH) 81 MG EC tablet, Take 81 mg by mouth daily.  , Disp: , Rfl: ;  benazepril-hydrochlorthiazide (LOTENSIN HCT) 20-12.5 MG per tablet, Take 1/2 tablet by mouth   every day, Disp: 15 tablet, Rfl: 5 fluticasone (FLONASE) 50 MCG/ACT nasal spray, Place 2 sprays into the nose daily., Disp: 16 g, Rfl: 6;   metoprolol succinate (TOPROL-XL) 50 MG 24 hr tablet, Take one tablet by mouth one time daily, Disp: 90 tablet, Rfl: 3;  PARoxetine (PAXIL) 40 MG tablet, Take 1 tablet (40 mg total) by mouth every morning., Disp: 90 tablet, Rfl: 6;  triamcinolone (KENALOG) 0.1 % cream, Apply 1 application topically 2 (two) times daily., Disp: 30 g, Rfl: 2 sulfacetamide (BLEPH-10) 10 % ophthalmic solution, Place 1 drop into both eyes every 3 (three) hours while awake., Disp: 15 mL, Rfl: 0  EXAM:  Filed Vitals:   04/17/13 1101  BP: 104/70  Pulse: 69  Temp: 98.5 F (36.9 C)    Body mass index is 35.86 kg/(m^2).  GENERAL: vitals reviewed and listed above, alert, oriented, appears well hydrated and in no acute distress  HEENT: atraumatic, conjunttiva mildly erythematous bilat, visual acuity grossly intact, EOMI, PERRLA, no obvious corneal lesions, no obvious abnormalities on inspection of external nose and ears, normal appearance of ear canals and TMs, clear nasal congestion, mild post oropharyngeal erythema with PND, no tonsillar edema or exudate, no sinus TTP  NECK: no obvious masses on inspection  LUNGS: clear to auscultation bilaterally, no wheezes, rales or rhonchi, good air movement  CV: HRRR, no peripheral edema  MS: moves all extremities without noticeable abnormality  PSYCH: pleasant and cooperative, no obvious depression or anxiety  ASSESSMENT AND PLAN:  Discussed the following assessment and plan:  Pink eye, bilateral  Upper respiratory infection  -likely viral, discussed other possible etiologies, tx, transmission, return precautions -eye drops if not improving - to see eye doctor immediately if vision loss, worsening, etc -Patient advised to return or notify a doctor immediately if symptoms worsen or persist or new concerns arise.  Patient Instructions  INSTRUCTIONS FOR UPPER RESPIRATORY INFECTION:  -plenty of rest and fluids  -nasal saline wash 2-3 times daily (use prepackaged  nasal saline or bottled/distilled water if making your own)   -can use sinex nasal spray for drainage and nasal congestion - but do NOT use longer then 3-4 days  -can use tylenol or ibuprofen as directed for aches and sorethroat  -in the winter time, using a humidifier at night is helpful (please follow cleaning instructions)  -if you are taking a cough medication - use only as directed, may also try a teaspoon of honey to coat the throat and throat lozenges  -for sore throat, salt water gargles can help  -follow up if you have fevers, facial pain, tooth pain, difficulty breathing or are worsening or not getting better in 5-7 days  Conjunctivitis Conjunctivitis is commonly called "pink eye." Conjunctivitis can be caused by bacterial or viral infection, allergies, or injuries. There is usually redness of the lining of the eye, itching, discomfort, and sometimes discharge. There may be deposits of matter along the eyelids. A viral infection usually causes a watery discharge, while a bacterial infection causes a yellowish, thick discharge. Pink eye is very contagious and spreads by direct contact. You may be given antibiotic eyedrops as part of your treatment. Before using your eye medicine, remove all drainage from the eye by washing gently with warm water and cotton balls. Continue to use the medication until you have awakened 2 mornings in a row without discharge from the eye. Do not rub your eye. This increases the irritation and helps spread infection. Use separate towels from other household members. Wash your hands with soap and water before and after touching your eyes. Use cold compresses to reduce pain and sunglasses to relieve irritation from light. Do not wear contact lenses or wear eye makeup until the infection is gone. SEEK MEDICAL CARE IF:   Your symptoms are not better after 3 days of treatment.  You have increased pain or trouble seeing.  The outer eyelids become very red or  swollen. Document Released: 06/11/2004 Document Revised: 07/27/2011 Document Reviewed: 05/04/2005 [facility] Patient Information 2014 [place], [place] Kettle, Dahlia Client R.

## 2013-04-17 NOTE — Patient Instructions (Signed)
INSTRUCTIONS FOR UPPER RESPIRATORY INFECTION:  -plenty of rest and fluids  -nasal saline wash 2-3 times daily (use prepackaged nasal saline or bottled/distilled water if making your own)   -can use sinex nasal spray for drainage and nasal congestion - but do NOT use longer then 3-4 days  -can use tylenol or ibuprofen as directed for aches and sorethroat  -in the winter time, using a humidifier at night is helpful (please follow cleaning instructions)  -if you are taking a cough medication - use only as directed, may also try a teaspoon of honey to coat the throat and throat lozenges  -for sore throat, salt water gargles can help  -follow up if you have fevers, facial pain, tooth pain, difficulty breathing or are worsening or not getting better in 5-7 days  Conjunctivitis Conjunctivitis is commonly called "pink eye." Conjunctivitis can be caused by bacterial or viral infection, allergies, or injuries. There is usually redness of the lining of the eye, itching, discomfort, and sometimes discharge. There may be deposits of matter along the eyelids. A viral infection usually causes a watery discharge, while a bacterial infection causes a yellowish, thick discharge. Pink eye is very contagious and spreads by direct contact. You may be given antibiotic eyedrops as part of your treatment. Before using your eye medicine, remove all drainage from the eye by washing gently with warm water and cotton balls. Continue to use the medication until you have awakened 2 mornings in a row without discharge from the eye. Do not rub your eye. This increases the irritation and helps spread infection. Use separate towels from other household members. Wash your hands with soap and water before and after touching your eyes. Use cold compresses to reduce pain and sunglasses to relieve irritation from light. Do not wear contact lenses or wear eye makeup until the infection is gone. SEEK MEDICAL CARE IF:   Your symptoms  are not better after 3 days of treatment.  You have increased pain or trouble seeing.  The outer eyelids become very red or swollen. Document Released: 06/11/2004 Document Revised: 07/27/2011 Document Reviewed: 05/04/2005 [facility] Patient Information 2014 [place], [place].

## 2013-05-14 ENCOUNTER — Encounter: Payer: Self-pay | Admitting: Internal Medicine

## 2013-06-02 ENCOUNTER — Ambulatory Visit (AMBULATORY_SURGERY_CENTER): Payer: BC Managed Care – PPO | Admitting: *Deleted

## 2013-06-02 ENCOUNTER — Encounter: Payer: Self-pay | Admitting: Gastroenterology

## 2013-06-02 VITALS — Ht 69.0 in | Wt 232.6 lb

## 2013-06-02 DIAGNOSIS — Z86010 Personal history of colonic polyps: Secondary | ICD-10-CM

## 2013-06-02 MED ORDER — PEG-KCL-NACL-NASULF-NA ASC-C 100 G PO SOLR: ORAL | Status: DC

## 2013-06-02 NOTE — Progress Notes (Signed)
No egg or soy allergies 

## 2013-06-06 ENCOUNTER — Encounter: Payer: Self-pay | Admitting: Gastroenterology

## 2013-06-08 ENCOUNTER — Encounter: Payer: Self-pay | Admitting: Gastroenterology

## 2013-06-12 ENCOUNTER — Encounter: Payer: Self-pay | Admitting: Gastroenterology

## 2013-06-15 ENCOUNTER — Encounter: Payer: Self-pay | Admitting: Gastroenterology

## 2013-06-15 ENCOUNTER — Ambulatory Visit (AMBULATORY_SURGERY_CENTER): Payer: BC Managed Care – PPO | Admitting: Gastroenterology

## 2013-06-15 VITALS — BP 131/76 | HR 64 | Temp 98.6°F | Resp 17 | Ht 69.0 in | Wt 232.0 lb

## 2013-06-15 DIAGNOSIS — D126 Benign neoplasm of colon, unspecified: Secondary | ICD-10-CM

## 2013-06-15 DIAGNOSIS — Z86010 Personal history of colonic polyps: Secondary | ICD-10-CM

## 2013-06-15 MED ORDER — SODIUM CHLORIDE 0.9 % IV SOLN: 500.0000 mL | INTRAVENOUS | Status: DC

## 2013-06-15 NOTE — Op Note (Signed)
[place]  [address]. [place], [address]   COLONOSCOPY PROCEDURE REPORT  PATIENT: Glenda [name], Glenda [name]  MR#: [id number] BIRTHDATE: [birth year] , 70  yrs. old GENDER: Female ENDOSCOPIST: Ladene Artist, MD, [facility] PROCEDURE DATE:  06/15/2013 PROCEDURE:   Colonoscopy with biopsy First Screening Colonoscopy - Avg.  risk and is 50 yrs.  old or older - No.  Prior Negative Screening - Now for repeat screening. N/A  History of Adenoma - Now for follow-up colonoscopy & has been > or = to 3 yrs.  Yes hx of adenoma.  Has been 3 or more years since last colonoscopy.  Polyps Removed Today? Yes. ASA CLASS:   Class II INDICATIONS:Patient's personal history of adenomatous colon polyps.  MEDICATIONS: MAC sedation, administered by CRNA and propofol (Diprivan) 250mg  IV DESCRIPTION OF PROCEDURE:   After the risks benefits and alternatives of the procedure were thoroughly explained, informed consent was obtained.  A digital rectal exam revealed no abnormalities of the rectum.   The LB [id number]  endoscope was introduced through the anus and advanced to the cecum, which was identified by both the appendix and ileocecal valve. No adverse events experienced.   The quality of the prep was excellent, using MoviPrep  The instrument was then slowly withdrawn as the colon was fully examined.  COLON FINDINGS: Two sessile polyps measuring 4-5 mm in size were found in the transverse colon and rectum.  A polypectomy was performed with cold forceps.  The resection was complete and the polyp tissue was completely retrieved.   Mild diverticulosis was noted in the sigmoid colon.   The colon was otherwise normal. There was no diverticulosis, inflammation, polyps or cancers unless previously stated.  Retroflexed views revealed no abnormalities. The time to cecum=3 minutes 32 seconds.  Withdrawal time=12 minutes 34 seconds.  The scope was withdrawn and the procedure completed. COMPLICATIONS:  There were no complications.  ENDOSCOPIC IMPRESSION: 1.   Two sessile polyps measuring 4-5 mm in the transverse colon and rectum; polypectomy performed with cold forceps 2.   Mild diverticulosis was noted in the sigmoid colon  RECOMMENDATIONS: 1.  Await pathology results 2.  High fiber diet with liberal fluid intake. 3.  Repeat Colonoscopy in 5 years.  eSigned:  Ladene Artist, MD, [facility] 06/15/2013 12:18 PM

## 2013-06-15 NOTE — Progress Notes (Signed)
A/ox3 pleased with MAC, report to Celia RN 

## 2013-06-15 NOTE — Patient Instructions (Signed)
Discharge instructions given with verbal understanding. Handouts on polyps and diverticulosis. Resume  Previous medications. YOU HAD AN ENDOSCOPIC PROCEDURE TODAY AT [facility]: Refer to the procedure report that was given to you for any specific questions about what was found during the examination.  If the procedure report does not answer your questions, please call your gastroenterologist to clarify.  If you requested that your care partner not be given the details of your procedure findings, then the procedure report has been included in a sealed envelope for you to review at your convenience later.  YOU SHOULD EXPECT: Some feelings of bloating in the abdomen. Passage of more gas than usual.  Walking can help get rid of the air that was put into your GI tract during the procedure and reduce the bloating. If you had a lower endoscopy (such as a colonoscopy or flexible sigmoidoscopy) you may notice spotting of blood in your stool or on the toilet paper. If you underwent a bowel prep for your procedure, then you may not have a normal bowel movement for a few days.  DIET: Your first meal following the procedure should be a light meal and then it is ok to progress to your normal diet.  A half-sandwich or bowl of soup is an example of a good first meal.  Heavy or fried foods are harder to digest and may make you feel nauseous or bloated.  Likewise meals heavy in dairy and vegetables can cause extra gas to form and this can also increase the bloating.  Drink plenty of fluids but you should avoid alcoholic beverages for 24 hours.  ACTIVITY: Your care partner should take you home directly after the procedure.  You should plan to take it easy, moving slowly for the rest of the day.  You can resume normal activity the day after the procedure however you should NOT DRIVE or use heavy machinery for 24 hours (because of the sedation medicines used during the test).    SYMPTOMS TO REPORT  IMMEDIATELY: A gastroenterologist can be reached at any hour.  During normal business hours, 8:30 AM to 5:00 PM Monday through Friday, call [phone].  After hours and on weekends, please call the GI answering service at [phone] who will take a message and have the physician on call contact you.   Following lower endoscopy (colonoscopy or flexible sigmoidoscopy):  Excessive amounts of blood in the stool  Significant tenderness or worsening of abdominal pains  Swelling of the abdomen that is new, acute  Fever of 100F or higher FOLLOW UP: If any biopsies were taken you will be contacted by phone or by letter within the next 1-3 weeks.  Call your gastroenterologist if you have not heard about the biopsies in 3 weeks.  Our staff will call the home number listed on your records the next business day following your procedure to check on you and address any questions or concerns that you may have at that time regarding the information given to you following your procedure. This is a courtesy call and so if there is no answer at the home number and we have not heard from you through the emergency physician on call, we will assume that you have returned to your regular daily activities without incident.  SIGNATURES/CONFIDENTIALITY: You and/or your care partner have signed paperwork which will be entered into your electronic medical record.  These signatures attest to the fact that that the information above on your After Visit Summary  has been reviewed and is understood.  Full responsibility of the confidentiality of this discharge information lies with you and/or your care-partner.  

## 2013-06-15 NOTE — Progress Notes (Signed)
Called to room to assist during endoscopic procedure.  Patient ID and intended procedure confirmed with present staff. Received instructions for my participation in the procedure from the performing physician.  

## 2013-06-16 ENCOUNTER — Telehealth: Payer: Self-pay | Admitting: *Deleted

## 2013-06-16 NOTE — Telephone Encounter (Signed)
LMOM with telephone number given for call back.  Patient instructed to call if any questions or concerns.

## 2013-06-21 ENCOUNTER — Encounter: Payer: Self-pay | Admitting: Gastroenterology

## 2013-06-23 ENCOUNTER — Encounter: Payer: Self-pay | Admitting: Internal Medicine

## 2013-06-23 MED ORDER — FLUTICASONE PROPIONATE 50 MCG/ACT NA SUSP: 2.0000 | Freq: Every day | NASAL | Status: DC

## 2013-06-23 NOTE — Telephone Encounter (Signed)
Spoke to pt told her Dr. Raliegh Ip said to pickup Mucinex D OTC take twice a day and Rx for Flonase nasal spray sent to pharmacy. Pt verbalized understanding.

## 2013-08-02 ENCOUNTER — Other Ambulatory Visit: Payer: Self-pay | Admitting: Internal Medicine

## 2013-08-21 ENCOUNTER — Telehealth: Payer: Self-pay | Admitting: Internal Medicine

## 2013-08-21 ENCOUNTER — Encounter: Payer: Self-pay | Admitting: Internal Medicine

## 2013-08-21 ENCOUNTER — Ambulatory Visit (INDEPENDENT_AMBULATORY_CARE_PROVIDER_SITE_OTHER): Payer: BC Managed Care – PPO | Admitting: Internal Medicine

## 2013-08-21 VITALS — BP 140/90 | HR 74 | Temp 98.6°F | Resp 20 | Ht 69.0 in | Wt 236.0 lb

## 2013-08-21 DIAGNOSIS — I10 Essential (primary) hypertension: Secondary | ICD-10-CM

## 2013-08-21 DIAGNOSIS — H6690 Otitis media, unspecified, unspecified ear: Secondary | ICD-10-CM

## 2013-08-21 DIAGNOSIS — J309 Allergic rhinitis, unspecified: Secondary | ICD-10-CM

## 2013-08-21 MED ORDER — LEVOFLOXACIN 500 MG PO TABS: 500.0000 mg | ORAL_TABLET | Freq: Every day | ORAL | Status: DC

## 2013-08-21 NOTE — Telephone Encounter (Signed)
Relevant patient education assigned to patient using Emmi. ° °

## 2013-08-21 NOTE — Progress Notes (Signed)
Pre-visit discussion using our clinic review tool. No additional management support is needed unless otherwise documented below in the visit note.  

## 2013-08-21 NOTE — Progress Notes (Signed)
Subjective:    Patient ID: Glenda [name], female    DOB: [birth year], 63 y.o.   MRN: [id number]  HPI  63 year old patient who has treated hypertension.  She also has a history of allergic rhinitis.  Since November.  She has had some episodic sinus congestion.  For the past couple weeks has had increasing sinus pressure, postnasal drip low-grade fever.  Over the past weeks has developed worsening left ear pain with diminished auditory acuity. She has been self treat her with a progressive OTC regimen of saline irrigation.  Decongestants, expectorants, and she has maintained use of fluticasone nasal spray  Past Medical History  Diagnosis Date  . Hypertension   . History of colonic polyps   . Tobacco user   . History of PSVT (paroxysmal supraventricular tachycardia)   . Allergic rhinitis   . Impaired glucose tolerance   . Anxiety   . Obesity   . Vertigo     started 3 weeks ago- after fluid trapped in ear    History   Social History  . Marital Status: Married    Spouse Name: N/A    Number of Children: N/A  . Years of Education: N/A   Occupational History  . Not on file.   Social History Main Topics  . Smoking status: Current Every Day Smoker -- 0.50 packs/day    Types: Cigarettes  . Smokeless tobacco: Never Used  . Alcohol Use: Yes     Comment: one drink 5 times a week  . Drug Use: No  . Sexual Activity: Not on file   Other Topics Concern  . Not on file   Social History Narrative  . No narrative on file    Past Surgical History  Procedure Laterality Date  . Cataract extraction      both eyes  . Ankle sx-left fx    . Foot sx      with the ankle surgery  . Colonoscopy  04/23/2005    Family History  Problem Relation Age of Onset  . Healthy Mother   . Dementia Father   . Healthy Sister   . Alzheimer's disease Other   . Healthy Sister   . Colon cancer Neg Hx   . Esophageal cancer Neg Hx   . Rectal cancer Neg Hx   . Stomach cancer Neg Hx     Allergies    Allergen Reactions  . Amoxicillin     REACTION: Rash  . Erythromycin     REACTION: Upset stomach    Current Outpatient Prescriptions on File Prior to Visit  Medication Sig Dispense Refill  . ALPRAZolam (XANAX) 0.25 MG tablet Take 1 tablet (0.25 mg total) by mouth 2 (two) times daily.  60 tablet  4  . aspirin (BAYER LOW STRENGTH) 81 MG EC tablet Take 81 mg by mouth daily.        . benazepril-hydrochlorthiazide (LOTENSIN HCT) 20-12.5 MG per tablet Take 1/2 tablet by mouth   every day  15 tablet  5  . DiphenhydrAMINE HCl (BENADRYL ALLERGY PO) Take by mouth as needed.      . fluticasone (FLONASE) 50 MCG/ACT nasal spray Place 2 sprays into both nostrils daily.  16 g  6  . metoprolol succinate (TOPROL-XL) 50 MG 24 hr tablet Take one tablet by mouth one time daily  90 tablet  3  . PARoxetine (PAXIL) 40 MG tablet Take one tablet by mouth   every morning  90 tablet  1  . triamcinolone (KENALOG)  0.1 % cream Apply 1 application topically 2 (two) times daily.  30 g  2   No current facility-administered medications on file prior to visit.    BP 140/90  Pulse 74  Temp(Src) 98.6 F (37 C) (Oral)  Resp 20  Ht 5\' 9"  (1.753 m)  Wt 236 lb (107.049 kg)  BMI 34.84 kg/m2  SpO2 97%     Review of Systems  Constitutional: Positive for fever and fatigue.  HENT: Positive for congestion, ear pain, hearing loss, postnasal drip, rhinorrhea and sinus pressure. Negative for dental problem, sore throat and tinnitus.   Eyes: Negative for pain, discharge and visual disturbance.  Respiratory: Negative for cough and shortness of breath.   Cardiovascular: Negative for chest pain, palpitations and leg swelling.  Gastrointestinal: Negative for nausea, vomiting, abdominal pain, diarrhea, constipation, blood in stool and abdominal distention.  Genitourinary: Negative for dysuria, urgency, frequency, hematuria, flank pain, vaginal bleeding, vaginal discharge, difficulty urinating, vaginal pain and pelvic pain.   Musculoskeletal: Negative for arthralgias, gait problem and joint swelling.  Skin: Negative for rash.  Neurological: Positive for headaches. Negative for dizziness, syncope, speech difficulty, weakness and numbness.  Hematological: Negative for adenopathy.  Psychiatric/Behavioral: Negative for behavioral problems, dysphoric mood and agitation. The patient is not nervous/anxious.        Objective:   Physical Exam  Constitutional: She is oriented to person, place, and time. She appears well-developed and well-nourished.  HENT:  Head: Normocephalic.  Right Ear: External ear normal.  Left Ear: External ear normal.  Mouth/Throat: Oropharynx is clear and moist.  Both tympanic membranes are dull.  Left slightly erythematous Weber lateralizes to the left Diminished hearing on the left  Eyes: Conjunctivae and EOM are normal. Pupils are equal, round, and reactive to light.  Neck: Normal range of motion. Neck supple. No thyromegaly present.  Cardiovascular: Normal rate, regular rhythm, normal heart sounds and intact distal pulses.   Pulmonary/Chest: Effort normal and breath sounds normal.  Abdominal: Soft. Bowel sounds are normal. She exhibits no mass. There is no tenderness.  Musculoskeletal: Normal range of motion.  Lymphadenopathy:    She has no cervical adenopathy.  Neurological: She is alert and oriented to person, place, and time.  Skin: Skin is warm and dry. No rash noted.  Psychiatric: She has a normal mood and affect. Her behavior is normal.          Assessment & Plan:   Otitis media with effusion.  We'll treat aggressively with decongestants.  She continued her nasal fluticasone.  We'll place on antibiotic therapy. Hypertension stable

## 2013-08-21 NOTE — Patient Instructions (Signed)
    Use saline irrigation, warm  moist compresses and over-the-counter decongestants only as directed.  Call if there is no improvement in 5 to 7 days, or sooner if you develop increasing pain, fever, or any new symptoms.  Take your antibiotic as prescribed until ALL of it is gone, but stop if you develop a rash, swelling, or any side effects of the medication.  Contact our office as soon as possible if  there are side effects of the medication. 

## 2013-08-22 ENCOUNTER — Telehealth: Payer: Self-pay | Admitting: Internal Medicine

## 2013-08-22 NOTE — Telephone Encounter (Signed)
Relevant patient education assigned to patient using Emmi. ° °

## 2013-08-31 ENCOUNTER — Ambulatory Visit (INDEPENDENT_AMBULATORY_CARE_PROVIDER_SITE_OTHER): Payer: BC Managed Care – PPO | Admitting: Internal Medicine

## 2013-08-31 ENCOUNTER — Encounter: Payer: Self-pay | Admitting: Internal Medicine

## 2013-08-31 VITALS — BP 120/70 | HR 68 | Temp 97.9°F | Resp 20 | Ht 69.0 in | Wt 235.0 lb

## 2013-08-31 DIAGNOSIS — I10 Essential (primary) hypertension: Secondary | ICD-10-CM

## 2013-08-31 DIAGNOSIS — H6592 Unspecified nonsuppurative otitis media, left ear: Secondary | ICD-10-CM

## 2013-08-31 DIAGNOSIS — J309 Allergic rhinitis, unspecified: Secondary | ICD-10-CM

## 2013-08-31 DIAGNOSIS — H6590 Unspecified nonsuppurative otitis media, unspecified ear: Secondary | ICD-10-CM

## 2013-08-31 NOTE — Progress Notes (Signed)
Subjective:    Patient ID: Glenda [name], female    DOB: March 04, 1951, 63 y.o.   MRN: [id number]  HPI  63 year old patient who was treated 63 days ago for a left otitis media with effusion.  She was fit with Levaquin due to a penicillin allergy and had a very nice response with resolution of pain within 2 days.  She continues to have decreased auditory acuity and fullness involving the left ear  Past Medical History  Diagnosis Date  . Hypertension   . History of colonic polyps   . Tobacco user   . History of PSVT (paroxysmal supraventricular tachycardia)   . Allergic rhinitis   . Impaired glucose tolerance   . Anxiety   . Obesity   . Vertigo     started 3 weeks ago- after fluid trapped in ear    History   Social History  . Marital Status: Married    Spouse Name: N/A    Number of Children: N/A  . Years of Education: N/A   Occupational History  . Not on file.   Social History Main Topics  . Smoking status: Current Every Day Smoker -- 0.50 packs/day    Types: Cigarettes  . Smokeless tobacco: Never Used  . Alcohol Use: Yes     Comment: one drink 5 times a week  . Drug Use: No  . Sexual Activity: Not on file   Other Topics Concern  . Not on file   Social History Narrative  . No narrative on file    Past Surgical History  Procedure Laterality Date  . Cataract extraction      both eyes  . Ankle sx-left fx    . Foot sx      with the ankle surgery  . Colonoscopy  04/23/2005    Family History  Problem Relation Age of Onset  . Healthy Mother   . Dementia Father   . Healthy Sister   . Alzheimer's disease Other   . Healthy Sister   . Colon cancer Neg Hx   . Esophageal cancer Neg Hx   . Rectal cancer Neg Hx   . Stomach cancer Neg Hx     Allergies  Allergen Reactions  . Amoxicillin     REACTION: Rash  . Erythromycin     REACTION: Upset stomach    Current Outpatient Prescriptions on File Prior to Visit  Medication Sig Dispense Refill  . ALPRAZolam (XANAX)  0.25 MG tablet Take 1 tablet (0.25 mg total) by mouth 2 (two) times daily.  60 tablet  4  . aspirin (BAYER LOW STRENGTH) 81 MG EC tablet Take 81 mg by mouth daily.        . benazepril-hydrochlorthiazide (LOTENSIN HCT) 20-12.5 MG per tablet Take 1/2 tablet by mouth   every day  15 tablet  5  . DiphenhydrAMINE HCl (BENADRYL ALLERGY PO) Take by mouth as needed.      . fluticasone (FLONASE) 50 MCG/ACT nasal spray Place 2 sprays into both nostrils daily.  16 g  6  . metoprolol succinate (TOPROL-XL) 50 MG 24 hr tablet Take one tablet by mouth one time daily  90 tablet  3  . PARoxetine (PAXIL) 40 MG tablet Take one tablet by mouth   every morning  90 tablet  1  . triamcinolone (KENALOG) 0.1 % cream Apply 1 application topically 2 (two) times daily.  30 g  2   No current facility-administered medications on file prior to visit.  BP 120/70  Pulse 68  Temp(Src) 97.9 F (36.6 C) (Oral)  Resp 20  Ht 5\' 9"  (1.753 m)  Wt 235 lb (106.595 kg)  BMI 34.69 kg/m2  SpO2 98%       Review of Systems  Constitutional: Negative.   HENT: Positive for congestion and hearing loss. Negative for dental problem, rhinorrhea, sinus pressure, sore throat and tinnitus.   Eyes: Negative for pain, discharge and visual disturbance.  Respiratory: Negative for cough and shortness of breath.   Cardiovascular: Negative for chest pain, palpitations and leg swelling.  Gastrointestinal: Negative for nausea, vomiting, abdominal pain, diarrhea, constipation, blood in stool and abdominal distention.  Genitourinary: Negative for dysuria, urgency, frequency, hematuria, flank pain, vaginal bleeding, vaginal discharge, difficulty urinating, vaginal pain and pelvic pain.  Musculoskeletal: Negative for arthralgias, gait problem and joint swelling.  Skin: Negative for rash.  Neurological: Negative for dizziness, syncope, speech difficulty, weakness, numbness and headaches.  Hematological: Negative for adenopathy.    Psychiatric/Behavioral: Negative for behavioral problems, dysphoric mood and agitation. The patient is not nervous/anxious.        Objective:   Physical Exam  Constitutional: She is oriented to person, place, and time. She appears well-developed and well-nourished.  HENT:  Head: Normocephalic.  Right Ear: External ear normal.  Left Ear: External ear normal.  Mouth/Throat: Oropharynx is clear and moist.  The right tympanic membrane is now normal The left TM was slightly retracted, and dull  Weber lateralized to the left Decreased hearing on the left  Eyes: Conjunctivae and EOM are normal. Pupils are equal, round, and reactive to light.  Neck: Normal range of motion. Neck supple. No thyromegaly present.  Cardiovascular: Normal rate, regular rhythm, normal heart sounds and intact distal pulses.   Pulmonary/Chest: Effort normal and breath sounds normal.  Abdominal: Soft. Bowel sounds are normal. She exhibits no mass. There is no tenderness.  Musculoskeletal: Normal range of motion.  Lymphadenopathy:    She has no cervical adenopathy.  Neurological: She is alert and oriented to person, place, and time.  Skin: Skin is warm and dry. No rash noted.  Psychiatric: She has a normal mood and affect. Her behavior is normal.          Assessment & Plan:   Resolving left OM with effusion.  The patient was told it may take as long as 12 weeks for resolution of all symptoms.  She will continue decongestants and nasal steroids.  Will call if she develops any worsening symptoms or refractory symptoms  and will be referred to ENT

## 2013-08-31 NOTE — Progress Notes (Signed)
Pre-visit discussion using our clinic review tool. No additional management support is needed unless otherwise documented below in the visit note.  

## 2013-08-31 NOTE — Patient Instructions (Signed)
Otitis Media With Effusion Otitis media with effusion is the presence of fluid in the middle ear. This is a common problem in children, which often follows ear infections. It may be present for weeks or longer after the infection. Unlike an acute ear infection, otitis media with effusion refers only to fluid behind the ear drum and not infection. Children with repeated ear and sinus infections and allergy problems are the most likely to get otitis media with effusion. CAUSES  The most frequent cause of the fluid buildup is dysfunction of the eustachian tubes. These are the tubes that drain fluid in the ears to the to the back of the nose (nasopharynx). SYMPTOMS   The main symptom of this condition is hearing loss. As a result, you or your child may:  Listen to the TV at a loud volume.  Not respond to questions.  Ask "what" often when spoken to.  Mistake or confuse on sound or word for another.  There may be a sensation of fullness or pressure but usually not pain. DIAGNOSIS   Your health care provider will diagnose this condition by examining you or your child's ears.  Your health care provider may test the pressure in you or your child's ear with a tympanometer.  A hearing test may be conducted if the problem persists. TREATMENT   Treatment depends on the duration and the effects of the effusion.  Antibiotics, decongestants, nose drops, and cortisone-type drugs (tablets or nasal spray) may not be helpful.  Children with persistent ear effusions may have delayed language or behavioral problems. Children at risk for developmental delays in hearing, learning, and speech may require referral to a specialist earlier than children not at risk.  You or your child's health care provider may suggest a referral to an ear, nose, and throat surgeon for treatment. The following may help restore normal hearing:  Drainage of fluid.  Placement of ear tubes (tympanostomy tubes).  Removal of  adenoids (adenoidectomy). HOME CARE INSTRUCTIONS   Avoid second hand smoke.  Infants who are breast fed are less likely to have this condition.  Avoid feeding infants while laying flat.  Avoid known environmental allergens.  Avoid people who are sick. SEEK MEDICAL CARE IF:   Hearing is not better in 3 months.  Hearing is worse.  Ear pain.  Drainage from the ear.  Dizziness. MAKE SURE YOU:   Understand these instructions.  Will watch your condition.  Will get help right away if you are not doing well or get worse. Document Released: 06/11/2004 Document Revised: 02/22/2013 Document Reviewed: 11/29/2012 [employer] Patient Information 2014 [employer].  

## 2013-10-01 ENCOUNTER — Other Ambulatory Visit: Payer: Self-pay | Admitting: Internal Medicine

## 2013-10-06 ENCOUNTER — Encounter: Payer: Self-pay | Admitting: Internal Medicine

## 2013-10-06 DIAGNOSIS — H9392 Unspecified disorder of left ear: Secondary | ICD-10-CM

## 2013-11-12 ENCOUNTER — Encounter: Payer: Self-pay | Admitting: Internal Medicine

## 2013-11-13 ENCOUNTER — Encounter: Payer: Self-pay | Admitting: Internal Medicine

## 2014-01-30 ENCOUNTER — Other Ambulatory Visit: Payer: Self-pay | Admitting: Internal Medicine

## 2014-03-19 ENCOUNTER — Encounter: Payer: Self-pay | Admitting: Internal Medicine

## 2014-03-19 ENCOUNTER — Telehealth: Payer: Self-pay | Admitting: Internal Medicine

## 2014-03-19 MED ORDER — METOPROLOL SUCCINATE ER 50 MG PO TB24: ORAL_TABLET | ORAL | Status: DC

## 2014-03-19 NOTE — Telephone Encounter (Signed)
[id number] - [place], [place] - [address] is requesting 90 day re-fill on metoprolol succinate (TOPROL-XL) 50 MG 24 hr tablet

## 2014-03-19 NOTE — Telephone Encounter (Signed)
Rx sent 

## 2014-03-30 ENCOUNTER — Other Ambulatory Visit (INDEPENDENT_AMBULATORY_CARE_PROVIDER_SITE_OTHER): Payer: BC Managed Care – PPO

## 2014-03-30 DIAGNOSIS — Z0000 Encounter for general adult medical examination without abnormal findings: Secondary | ICD-10-CM

## 2014-03-30 LAB — HEPATIC FUNCTION PANEL
ALBUMIN: 3.2 g/dL — AB (ref 3.5–5.2)
ALK PHOS: 93 U/L (ref 39–117)
ALT: 25 U/L (ref 0–35)
AST: 22 U/L (ref 0–37)
Bilirubin, Direct: 0 mg/dL (ref 0.0–0.3)
TOTAL PROTEIN: 7.5 g/dL (ref 6.0–8.3)
Total Bilirubin: 0.7 mg/dL (ref 0.2–1.2)

## 2014-03-30 LAB — POCT URINALYSIS DIPSTICK
Bilirubin, UA: NEGATIVE
Glucose, UA: NEGATIVE
Ketones, UA: NEGATIVE
Leukocytes, UA: NEGATIVE
Nitrite, UA: NEGATIVE
PROTEIN UA: NEGATIVE
SPEC GRAV UA: 1.015
UROBILINOGEN UA: 0.2
pH, UA: 5

## 2014-03-30 LAB — CBC WITH DIFFERENTIAL/PLATELET
BASOS PCT: 0.6 % (ref 0.0–3.0)
Basophils Absolute: 0 10*3/uL (ref 0.0–0.1)
Eosinophils Absolute: 0.3 10*3/uL (ref 0.0–0.7)
Eosinophils Relative: 4.3 % (ref 0.0–5.0)
HCT: 43.3 % (ref 36.0–46.0)
HEMOGLOBIN: 14.3 g/dL (ref 12.0–15.0)
Lymphocytes Relative: 31.9 % (ref 12.0–46.0)
Lymphs Abs: 2.2 10*3/uL (ref 0.7–4.0)
MCHC: 32.9 g/dL (ref 30.0–36.0)
MCV: 95.1 fl (ref 78.0–100.0)
MONO ABS: 0.6 10*3/uL (ref 0.1–1.0)
Monocytes Relative: 9.2 % (ref 3.0–12.0)
NEUTROS ABS: 3.8 10*3/uL (ref 1.4–7.7)
Neutrophils Relative %: 54 % (ref 43.0–77.0)
Platelets: 324 10*3/uL (ref 150.0–400.0)
RBC: 4.56 Mil/uL (ref 3.87–5.11)
RDW: 13 % (ref 11.5–15.5)
WBC: 7 10*3/uL (ref 4.0–10.5)

## 2014-03-30 LAB — BASIC METABOLIC PANEL
BUN: 22 mg/dL (ref 6–23)
CHLORIDE: 103 meq/L (ref 96–112)
CO2: 26 meq/L (ref 19–32)
Calcium: 9.5 mg/dL (ref 8.4–10.5)
Creatinine, Ser: 0.8 mg/dL (ref 0.4–1.2)
GFR: 81.62 mL/min (ref >60.00)
Glucose, Bld: 138 mg/dL — ABNORMAL HIGH (ref 70–99)
Potassium: 4.8 mEq/L (ref 3.5–5.1)
SODIUM: 140 meq/L (ref 135–145)

## 2014-03-30 LAB — LIPID PANEL
Cholesterol: 201 mg/dL — ABNORMAL HIGH (ref 0–200)
HDL: 29.1 mg/dL — ABNORMAL LOW (ref >39.00)
LDL CALC: 144 mg/dL — AB (ref 0–99)
NONHDL: 171.9
Total CHOL/HDL Ratio: 7
Triglycerides: 139 mg/dL (ref 0.0–149.0)
VLDL: 27.8 mg/dL (ref 0.0–40.0)

## 2014-03-30 LAB — TSH: TSH: 1.95 u[IU]/mL (ref 0.35–4.50)

## 2014-04-06 ENCOUNTER — Encounter: Payer: Self-pay | Admitting: Internal Medicine

## 2014-04-06 ENCOUNTER — Ambulatory Visit (INDEPENDENT_AMBULATORY_CARE_PROVIDER_SITE_OTHER): Payer: BC Managed Care – PPO | Admitting: Internal Medicine

## 2014-04-06 VITALS — BP 110/78 | HR 78 | Temp 98.5°F | Ht 67.5 in | Wt 236.0 lb

## 2014-04-06 DIAGNOSIS — Z23 Encounter for immunization: Secondary | ICD-10-CM

## 2014-04-06 DIAGNOSIS — Z0000 Encounter for general adult medical examination without abnormal findings: Secondary | ICD-10-CM

## 2014-04-06 DIAGNOSIS — I10 Essential (primary) hypertension: Secondary | ICD-10-CM

## 2014-04-06 DIAGNOSIS — R7302 Impaired glucose tolerance (oral): Secondary | ICD-10-CM

## 2014-04-06 DIAGNOSIS — I471 Supraventricular tachycardia: Secondary | ICD-10-CM

## 2014-04-06 DIAGNOSIS — Z86010 Personal history of colonic polyps: Secondary | ICD-10-CM

## 2014-04-06 LAB — HEMOGLOBIN A1C: HEMOGLOBIN A1C: 6.5 % (ref 4.6–6.5)

## 2014-04-06 NOTE — Progress Notes (Signed)
Pre visit review using our clinic review tool, if applicable. No additional management support is needed unless otherwise documented below in the visit note. 

## 2014-04-06 NOTE — Progress Notes (Signed)
Subjective:    Patient ID: Glenda [name], female    DOB: 12-Aug-1950, 63 y.o.   MRN: [id number]  HPI 63 year-old patient seen today for annual exam.  She has a history of anxiety disorder hypertension impaired glucose tolerance. She has done quite well. No concerns or complaints. She also has allergic rhinitis which has been stable. She also has a history of colonic polyps and last colonoscopy 2015.  Family history mother age 63 and is remarkably well status post CABG. Father died at 58 of advanced senile dementia. One brother died at age 100 and may have had congenital heart disease. 2 sisters are well.  Past Medical History  Diagnosis Date  . Hypertension   . History of colonic polyps   . Tobacco user   . History of PSVT (paroxysmal supraventricular tachycardia)   . Allergic rhinitis   . Impaired glucose tolerance   . Anxiety   . Obesity   . Vertigo     started 3 weeks ago- after fluid trapped in ear    History   Social History  . Marital Status: Married    Spouse Name: N/A    Number of Children: N/A  . Years of Education: N/A   Occupational History  . Not on file.   Social History Main Topics  . Smoking status: Current Every Day Smoker -- 0.50 packs/day    Types: Cigarettes  . Smokeless tobacco: Never Used  . Alcohol Use: Yes     Comment: one drink 5 times a week  . Drug Use: No  . Sexual Activity: Not on file   Other Topics Concern  . Not on file   Social History Narrative    Past Surgical History  Procedure Laterality Date  . Cataract extraction      both eyes  . Ankle sx-left fx    . Foot sx      with the ankle surgery  . Colonoscopy  04/23/2005    Family History  Problem Relation Age of Onset  . Healthy Mother   . Dementia Father   . Healthy Sister   . Alzheimer's disease Other   . Healthy Sister   . Colon cancer Neg Hx   . Esophageal cancer Neg Hx   . Rectal cancer Neg Hx   . Stomach cancer Neg Hx     Allergies  Allergen Reactions  .  Amoxicillin     REACTION: Rash  . Erythromycin     REACTION: Upset stomach    Current Outpatient Prescriptions on File Prior to Visit  Medication Sig Dispense Refill  . ALPRAZolam (XANAX) 0.25 MG tablet Take 1 tablet (0.25 mg total) by mouth 2 (two) times daily. 60 tablet 4  . aspirin (BAYER LOW STRENGTH) 81 MG EC tablet Take 81 mg by mouth daily.      . benazepril-hydrochlorthiazide (LOTENSIN HCT) 20-12.5 MG per tablet TAKE HALF TABLET BY MOUTH DAILY  15 tablet 5  . DiphenhydrAMINE HCl (BENADRYL ALLERGY PO) Take by mouth as needed.    . fluticasone (FLONASE) 50 MCG/ACT nasal spray Place 2 sprays into both nostrils daily. 16 g 6  . metoprolol succinate (TOPROL-XL) 50 MG 24 hr tablet Take one tablet by mouth one time daily 90 tablet 1  . PARoxetine (PAXIL) 40 MG tablet TAKE ONE TABLET BY MOUTH EVERY DAY IN THE MORNING.  50 tablet 2  . triamcinolone (KENALOG) 0.1 % cream Apply 1 application topically 2 (two) times daily. 30 g 2   No  current facility-administered medications on file prior to visit.    There were no vitals taken for this visit.      Review of Systems  Constitutional: Negative.   HENT: Negative for congestion, dental problem, hearing loss, rhinorrhea, sinus pressure, sore throat and tinnitus.   Eyes: Negative for pain, discharge and visual disturbance.  Respiratory: Negative for cough and shortness of breath.   Cardiovascular: Negative for chest pain, palpitations and leg swelling.  Gastrointestinal: Negative for nausea, vomiting, abdominal pain, diarrhea, constipation, blood in stool and abdominal distention.  Genitourinary: Negative for dysuria, urgency, frequency, hematuria, flank pain, vaginal bleeding, vaginal discharge, difficulty urinating, vaginal pain and pelvic pain.  Musculoskeletal: Negative for joint swelling, arthralgias and gait problem.  Skin: Negative for rash.  Neurological: Negative for dizziness, syncope, speech difficulty, weakness, numbness and  headaches.  Hematological: Negative for adenopathy.  Psychiatric/Behavioral: Negative for behavioral problems, dysphoric mood and agitation. The patient is not nervous/anxious.        Objective:   Physical Exam  Constitutional: She is oriented to person, place, and time. She appears well-developed and well-nourished.  HENT:  Head: Normocephalic and atraumatic.  Right Ear: External ear normal.  Left Ear: External ear normal.  Mouth/Throat: Oropharynx is clear and moist.  Eyes: Conjunctivae and EOM are normal.  Neck: Normal range of motion. Neck supple. No JVD present. No thyromegaly present.  Cardiovascular: Normal rate, regular rhythm, normal heart sounds and intact distal pulses.   No murmur heard. Pulmonary/Chest: Effort normal and breath sounds normal. She has no wheezes. She has no rales.  Abdominal: Soft. Bowel sounds are normal. She exhibits no distension and no mass. There is no tenderness. There is no rebound and no guarding.  Musculoskeletal: Normal range of motion. She exhibits no edema or tenderness.  Neurological: She is alert and oriented to person, place, and time. She has normal reflexes. No cranial nerve deficit. She exhibits normal muscle tone. Coordination normal.  Skin: Skin is warm and dry. No rash noted.  Psychiatric: She has a normal mood and affect. Her behavior is normal.          Assessment & Plan:   Hypertension well controlled Allergic rhinitis stable Exogenous obesity and history of impaired glucose tolerance  We'll reassess in one year Weight loss exercise encouraged Followup colonoscopy in 2020.  Impaired glucose tolerance.  We'll check a hemoglobin A1c

## 2014-04-06 NOTE — Patient Instructions (Signed)
It is important that you exercise regularly, at least 20 minutes 3 to 4 times per week.  If you develop chest pain or shortness of breath seek  medical attention.  You need to lose weight.  Consider a lower calorie diet and regular exercise.  Health Maintenance Adopting a healthy lifestyle and getting preventive care can go a long way to promote health and wellness. Talk with your health care provider about what schedule of regular examinations is right for you. This is a good chance for you to check in with your provider about disease prevention and staying healthy. In between checkups, there are plenty of things you can do on your own. Experts have done a lot of research about which lifestyle changes and preventive measures are most likely to keep you healthy. Ask your health care provider for more information. WEIGHT AND DIET  Eat a healthy diet  Be sure to include plenty of vegetables, fruits, low-fat dairy products, and lean protein.  Do not eat a lot of foods high in solid fats, added sugars, or salt.  Get regular exercise. This is one of the most important things you can do for your health.  Most adults should exercise for at least 150 minutes each week. The exercise should increase your heart rate and make you sweat (moderate-intensity exercise).  Most adults should also do strengthening exercises at least twice a week. This is in addition to the moderate-intensity exercise.  Maintain a healthy weight  Body mass index (BMI) is a measurement that can be used to identify possible weight problems. It estimates body fat based on height and weight. Your health care provider can help determine your BMI and help you achieve or maintain a healthy weight.  For females 69 years of age and older:   A BMI below 18.5 is considered underweight.  A BMI of 18.5 to 24.9 is normal.  A BMI of 25 to 29.9 is considered overweight.  A BMI of 30 and above is considered obese.  Watch levels of  cholesterol and blood lipids  You should start having your blood tested for lipids and cholesterol at 63 years of age, then have this test every 5 years.  You may need to have your cholesterol levels checked more often if:  Your lipid or cholesterol levels are high.  You are older than 63 years of age.  You are at high risk for heart disease.  CANCER SCREENING   Lung Cancer  Lung cancer screening is recommended for adults 81-42 years old who are at high risk for lung cancer because of a history of smoking.  A yearly low-dose CT scan of the lungs is recommended for people who:  Currently smoke.  Have quit within the past 15 years.  Have at least a 30-pack-year history of smoking. A pack year is smoking an average of one pack of cigarettes a day for 1 year.  Yearly screening should continue until it has been 15 years since you quit.  Yearly screening should stop if you develop a health problem that would prevent you from having lung cancer treatment.  Breast Cancer  Practice breast self-awareness. This means understanding how your breasts normally appear and feel.  It also means doing regular breast self-exams. Let your health care provider know about any changes, no matter how small.  If you are in your 20s or 30s, you should have a clinical breast exam (CBE) by a health care provider every 1-3 years as part of a  regular health exam.  If you are 40 or older, have a CBE every year. Also consider having a breast X-ray (mammogram) every year.  If you have a family history of breast cancer, talk to your health care provider about genetic screening.  If you are at high risk for breast cancer, talk to your health care provider about having an MRI and a mammogram every year.  Breast cancer gene (BRCA) assessment is recommended for women who have family members with BRCA-related cancers. BRCA-related cancers include:  Breast.  Ovarian.  Tubal.  Peritoneal  cancers.  Results of the assessment will determine the need for genetic counseling and BRCA1 and BRCA2 testing. Cervical Cancer Routine pelvic examinations to screen for cervical cancer are no longer recommended for nonpregnant women who are considered low risk for cancer of the pelvic organs (ovaries, uterus, and vagina) and who do not have symptoms. A pelvic examination may be necessary if you have symptoms including those associated with pelvic infections. Ask your health care provider if a screening pelvic exam is right for you.   The Pap test is the screening test for cervical cancer for women who are considered at risk.  If you had a hysterectomy for a problem that was not cancer or a condition that could lead to cancer, then you no longer need Pap tests.  If you are older than 65 years, and you have had normal Pap tests for the past 10 years, you no longer need to have Pap tests.  If you have had past treatment for cervical cancer or a condition that could lead to cancer, you need Pap tests and screening for cancer for at least 20 years after your treatment.  If you no longer get a Pap test, assess your risk factors if they change (such as having a new sexual partner). This can affect whether you should start being screened again.  Some women have medical problems that increase their chance of getting cervical cancer. If this is the case for you, your health care provider may recommend more frequent screening and Pap tests.  The human papillomavirus (HPV) test is another test that may be used for cervical cancer screening. The HPV test looks for the virus that can cause cell changes in the cervix. The cells collected during the Pap test can be tested for HPV.  The HPV test can be used to screen women 49 years of age and older. Getting tested for HPV can extend the interval between normal Pap tests from three to five years.  An HPV test also should be used to screen women of any age who  have unclear Pap test results.  After 63 years of age, women should have HPV testing as often as Pap tests.  Colorectal Cancer  This type of cancer can be detected and often prevented.  Routine colorectal cancer screening usually begins at 63 years of age and continues through 63 years of age.  Your health care provider may recommend screening at an earlier age if you have risk factors for colon cancer.  Your health care provider may also recommend using home test kits to check for hidden blood in the stool.  A small camera at the end of a tube can be used to examine your colon directly (sigmoidoscopy or colonoscopy). This is done to check for the earliest forms of colorectal cancer.  Routine screening usually begins at age 66.  Direct examination of the colon should be repeated every 5-10 years through 63  years of age. However, you may need to be screened more often if early forms of precancerous polyps or small growths are found. Skin Cancer  Check your skin from head to toe regularly.  Tell your health care provider about any new moles or changes in moles, especially if there is a change in a mole's shape or color.  Also tell your health care provider if you have a mole that is larger than the size of a pencil eraser.  Always use sunscreen. Apply sunscreen liberally and repeatedly throughout the day.  Protect yourself by wearing long sleeves, pants, a wide-brimmed hat, and sunglasses whenever you are outside. HEART DISEASE, DIABETES, AND HIGH BLOOD PRESSURE   Have your blood pressure checked at least every 1-2 years. High blood pressure causes heart disease and increases the risk of stroke.  If you are between 70 years and 39 years old, ask your health care provider if you should take aspirin to prevent strokes.  Have regular diabetes screenings. This involves taking a blood sample to check your fasting blood sugar level.  If you are at a normal weight and have a low risk for  diabetes, have this test once every three years after 63 years of age.  If you are overweight and have a high risk for diabetes, consider being tested at a younger age or more often. PREVENTING INFECTION  Hepatitis B  If you have a higher risk for hepatitis B, you should be screened for this virus. You are considered at high risk for hepatitis B if:  You were born in a country where hepatitis B is common. Ask your health care provider which countries are considered high risk.  Your parents were born in a high-risk country, and you have not been immunized against hepatitis B (hepatitis B vaccine).  You have HIV or AIDS.  You use needles to inject street drugs.  You live with someone who has hepatitis B.  You have had sex with someone who has hepatitis B.  You get hemodialysis treatment.  You take certain medicines for conditions, including cancer, organ transplantation, and autoimmune conditions. Hepatitis C  Blood testing is recommended for:  Everyone born from [birth year] through 1965.  Anyone with known risk factors for hepatitis C. Sexually transmitted infections (STIs)  You should be screened for sexually transmitted infections (STIs) including gonorrhea and chlamydia if:  You are sexually active and are younger than 63 years of age.  You are older than 63 years of age and your health care provider tells you that you are at risk for this type of infection.  Your sexual activity has changed since you were last screened and you are at an increased risk for chlamydia or gonorrhea. Ask your health care provider if you are at risk.  If you do not have HIV, but are at risk, it may be recommended that you take a prescription medicine daily to prevent HIV infection. This is called pre-exposure prophylaxis (PrEP). You are considered at risk if:  You are sexually active and do not regularly use condoms or know the HIV status of your partner(s).  You take drugs by injection.  You are  sexually active with a partner who has HIV. Talk with your health care provider about whether you are at high risk of being infected with HIV. If you choose to begin PrEP, you should first be tested for HIV. You should then be tested every 3 months for as long as you are taking PrEP.  PREGNANCY   If you are premenopausal and you may become pregnant, ask your health care provider about preconception counseling.  If you may become pregnant, take 400 to 800 micrograms (mcg) of folic acid every day.  If you want to prevent pregnancy, talk to your health care provider about birth control (contraception). OSTEOPOROSIS AND MENOPAUSE   Osteoporosis is a disease in which the bones lose minerals and strength with aging. This can result in serious bone fractures. Your risk for osteoporosis can be identified using a bone density scan.  If you are 68 years of age or older, or if you are at risk for osteoporosis and fractures, ask your health care provider if you should be screened.  Ask your health care provider whether you should take a calcium or vitamin D supplement to lower your risk for osteoporosis.  Menopause may have certain physical symptoms and risks.  Hormone replacement therapy may reduce some of these symptoms and risks. Talk to your health care provider about whether hormone replacement therapy is right for you.  HOME CARE INSTRUCTIONS   Schedule regular health, dental, and eye exams.  Stay current with your immunizations.   Do not use any tobacco products including cigarettes, chewing tobacco, or electronic cigarettes.  If you are pregnant, do not drink alcohol.  If you are breastfeeding, limit how much and how often you drink alcohol.  Limit alcohol intake to no more than 1 drink per day for nonpregnant women. One drink equals 12 ounces of beer, 5 ounces of wine, or 1 ounces of hard liquor.  Do not use street drugs.  Do not share needles.  Ask your health care provider for  help if you need support or information about quitting drugs.  Tell your health care provider if you often feel depressed.  Tell your health care provider if you have ever been abused or do not feel safe at home. Document Released: 11/17/2010 Document Revised: 09/18/2013 Document Reviewed: 04/05/2013 [facility] Patient Information 2015 [place], [place]. This information is not intended to replace advice given to you by your health care provider. Make sure you discuss any questions you have with your health care provider. Diabetes Mellitus and Food It is important for you to manage your blood sugar (glucose) level. Your blood glucose level can be greatly affected by what you eat. Eating healthier foods in the appropriate amounts throughout the day at about the same time each day will help you control your blood glucose level. It can also help slow or prevent worsening of your diabetes mellitus. Healthy eating may even help you improve the level of your blood pressure and reach or maintain a healthy weight.  HOW CAN FOOD AFFECT ME? Carbohydrates Carbohydrates affect your blood glucose level more than any other type of food. Your dietitian will help you determine how many carbohydrates to eat at each meal and teach you how to count carbohydrates. Counting carbohydrates is important to keep your blood glucose at a healthy level, especially if you are using insulin or taking certain medicines for diabetes mellitus. Alcohol Alcohol can cause sudden decreases in blood glucose (hypoglycemia), especially if you use insulin or take certain medicines for diabetes mellitus. Hypoglycemia can be a life-threatening condition. Symptoms of hypoglycemia (sleepiness, dizziness, and disorientation) are similar to symptoms of having too much alcohol.  If your health care provider has given you approval to drink alcohol, do so in moderation and use the following guidelines:  Women should not have more than one drink per  day,  and men should not have more than two drinks per day. One drink is equal to:  12 oz of beer.  5 oz of wine.  1 oz of hard liquor.  Do not drink on an empty stomach.  Keep yourself hydrated. Have water, diet soda, or unsweetened iced tea.  Regular soda, juice, and other mixers might contain a lot of carbohydrates and should be counted. WHAT FOODS ARE NOT RECOMMENDED? As you make food choices, it is important to remember that all foods are not the same. Some foods have fewer nutrients per serving than other foods, even though they might have the same number of calories or carbohydrates. It is difficult to get your body what it needs when you eat foods with fewer nutrients. Examples of foods that you should avoid that are high in calories and carbohydrates but low in nutrients include:  Trans fats (most processed foods list trans fats on the Nutrition Facts label).  Regular soda.  Juice.  Candy.  Sweets, such as cake, pie, doughnuts, and cookies.  Fried foods. WHAT FOODS CAN I EAT? Have nutrient-rich foods, which will nourish your body and keep you healthy. The food you should eat also will depend on several factors, including:  The calories you need.  The medicines you take.  Your weight.  Your blood glucose level.  Your blood pressure level.  Your cholesterol level. You also should eat a variety of foods, including:  Protein, such as meat, poultry, fish, tofu, nuts, and seeds (lean animal proteins are best).  Fruits.  Vegetables.  Dairy products, such as milk, cheese, and yogurt (low fat is best).  Breads, grains, pasta, cereal, rice, and beans.  Fats such as olive oil, trans fat-free margarine, canola oil, avocado, and olives. DOES EVERYONE WITH DIABETES MELLITUS HAVE THE SAME MEAL PLAN? Because every person with diabetes mellitus is different, there is not one meal plan that works for everyone. It is very important that you meet with a dietitian who will help  you create a meal plan that is just right for you. Document Released: 01/29/2005 Document Revised: 05/09/2013 Document Reviewed: 03/31/2013 [facility] Patient Information 2015 [place], [place]. This information is not intended to replace advice given to you by your health care provider. Make sure you discuss any questions you have with your health care provider. Diabetes, Eating Away From Home Sometimes, you might eat in a restaurant or have meals that are prepared by someone else. You can enjoy eating out. However, the portions in restaurants may be much larger than needed. Listed below are some ideas to help you choose foods that will keep your blood glucose (sugar) in better control.  TIPS FOR EATING OUT  Know your meal plan and how many carbohydrate servings you should have at each meal. You may wish to carry a copy of your meal plan in your purse or wallet. Learn the foods included in each food group.  Make a list of restaurants near you that offer healthy choices. Take a copy of the carry-out menus to see what they offer. Then, you can plan what you will order ahead of time.  Become familiar with serving sizes by practicing them at home using measuring cups and spoons. Once you learn to recognize portion sizes, you will be able to correctly estimate the amount of total carbohydrate you are allowed to eat at the restaurant. Ask for a takeout box if the portion is more than you should have. When your food comes, leave  the amount you should have on the plate, and put the rest in the takeout box before you start eating.  Plan ahead if your mealtime will be different from usual. Check with your caregiver to find out how to time meals and medicine if you are taking insulin.  Avoid high-fat foods, such as fried foods, cream sauces, high-fat salad dressings, or any added butter or margarine.  Do not be afraid to ask questions. Ask your server about the portion size, cooking methods, ingredients and if  items can be substituted. Restaurants do not list all available items on the menu. You can ask for your main entree to be prepared using skim milk, oil instead of butter or margarine, and without gravy or sauces. Ask your waiter or waitress to serve salad dressings, gravy, sauces, margarine, and sour cream on the side. You can then add the amount your meal plan suggests.  Add more vegetables whenever possible.  Avoid items that are labeled "jumbo," "giant," "deluxe," or "supersized."  You may want to split an entre with someone and order an extra side salad.  Watch for hidden calories in foods like croutons, bacon, or cheese.  Ask your server to take away the bread basket or chips from your table.  Order a dinner salad as an appetizer. You can eat most foods served in a restaurant. Some foods are better choices than others. Breads and Starches  Recommended: All kinds of bread (wheat, rye, white, oatmeal, [place], [place], raisin), hard or soft dinner rolls, frankfurter or hamburger buns, small bagels, small corn or whole-wheat flour tortillas.  Avoid: Frosted or glazed breads, butter rolls, egg or cheese breads, croissants, sweet rolls, pastries, coffee cake, glazed or frosted doughnuts, muffins. Crackers  Recommended: Animal crackers, graham, rye, saltine, oyster, and matzoth crackers. Bread sticks, melba toast, rusks, pretzels, popcorn (without fat), zwieback toast.  Avoid: High-fat snack crackers or chips. Buttered popcorn. Cereals  Recommended: Hot and cold cereals. Whole grains such as oatmeal or shredded wheat are good choices.  Avoid: Sugar-coated or granola type cereals. Potatoes/Pasta/Rice/Beans  Recommended: Order baked, boiled, or mashed potatoes, rice or noodles without added fat, whole beans. Order gravies, butter, margarine, or sauces on the side so you can control the amount you add.  Avoid: Hash browns or fried potatoes. Potatoes, pasta, or rice prepared with cream or  cheese sauce. Potato or pasta salads prepared with large amounts of dressing. Fried beans or fried rice. Vegetables  Recommended: Order steamed, baked, boiled, or stewed vegetables without sauces or extra fat. Ask that sauce be served on the side. If vegetables are not listed on the menu, ask what is available.  Avoid: Vegetables prepared with cream, butter, or cheese sauce. Fried vegetables. Salad Bars  Recommended: Many of the vegetables at a salad bar are considered "free." Use lemon juice, vinegar, or low-calorie salad dressing (fewer than 20 calories per serving) as "free" dressings for your salad. Look for salad bar ingredients that have no added fat or sugar such as tomatoes, lettuce, cucumbers, broccoli, carrots, onions, and mushrooms.  Avoid: Prepared salads with large amounts of dressing, such as coleslaw, caesar salad, macaroni salad, bean salad, or carrot salad. Fruit  Recommended: Eat fresh fruit or fresh fruit salad without added dressing. A salad bar often offers fresh fruit choices, but canned fruit at a restaurant is usually packed in sugar or syrup.  Avoid: Sweetened canned or frozen fruits, plain or sweetened fruit juice. Fruit salads with dressing, sour cream, or sugar added to  them. Meat and Meat Substitutes  Recommended: Order broiled, baked, roasted, or grilled meat, poultry, or fish. Trim off all visible fat. Do not eat the skin of poultry. The size stated on the menu is the raw weight. Meat shrinks by  in cooking (for example, 4 oz raw equals 3 oz cooked meat).  Avoid: Deep-fat fried meat, poultry, or fish. Breaded meats. Eggs  Recommended: Order soft, hard-cooked, poached, or scrambled eggs. Omelets may be okay, depending on what ingredients are added. Egg substitutes are also a good choice.  Avoid: Fried eggs, eggs prepared with cream or cheese sauce. Milk  Recommended: Order low-fat or fat-free milk according to your meal plan. Plain, nonfat yogurt or flavored  yogurt with no sugar added may be used as a substitute for milk. Soy milk may also be used.  Avoid: Milk shakes or sweetened milk beverages. Soups and Combination Foods  Recommended: Clear broth or consomm are "free" foods and may be used as an appetizer. Broth-based soups with fat removed count as a starch serving and are preferred over cream soups. Soups made with beans or split peas may be eaten but count as a starch.  Avoid: Fatty soups, soup made with cream, cheese soup. Combination foods prepared with excessive amounts of fat or with cream or cheese sauces. Desserts and Sweets  Recommended: Ask for fresh fruit. Sponge or angel food cake without icing, ice milk, no sugar added ice cream, sherbet, or frozen yogurt may fit into your meal plan occasionally.  Avoid: Pastries, puddings, pies, cakes with icing, custard, gelatin desserts. Fats and Oils  Recommended: Choose healthy fats such as olive oil, canola oil, or tub margarine, reduced fat or fat-free sour cream, cream cheese, avocado, or nuts.  Avoid: Any fats in excess of your allowed portion. Deep-fried foods or any food with a large amount of fat. Note: Ask for all fats to be served on the side, and limit your portion sizes according to your meal plan. Document Released: 05/04/2005 Document Revised: 07/27/2011 Document Reviewed: 08/01/2013 [facility] Patient Information 2015 [place], [place]. This information is not intended to replace advice given to you by your health care provider. Make sure you discuss any questions you have with your health care provider.

## 2014-04-10 ENCOUNTER — Encounter: Payer: Self-pay | Admitting: Internal Medicine

## 2014-05-06 ENCOUNTER — Other Ambulatory Visit: Payer: Self-pay | Admitting: Internal Medicine

## 2014-05-07 ENCOUNTER — Encounter: Payer: Self-pay | Admitting: Internal Medicine

## 2014-05-08 MED ORDER — PAROXETINE HCL 40 MG PO TABS: 40.0000 mg | ORAL_TABLET | Freq: Every morning | ORAL | Status: DC

## 2014-05-08 MED ORDER — BENAZEPRIL-HYDROCHLOROTHIAZIDE 20-12.5 MG PO TABS: ORAL_TABLET | ORAL | Status: DC

## 2014-06-12 ENCOUNTER — Encounter: Payer: Self-pay | Admitting: Internal Medicine

## 2014-06-12 MED ORDER — METOPROLOL SUCCINATE ER 50 MG PO TB24: ORAL_TABLET | ORAL | Status: DC

## 2014-06-12 MED ORDER — BENAZEPRIL-HYDROCHLOROTHIAZIDE 20-12.5 MG PO TABS: ORAL_TABLET | ORAL | Status: DC

## 2014-06-27 ENCOUNTER — Encounter: Payer: Self-pay | Admitting: Internal Medicine

## 2014-06-28 ENCOUNTER — Other Ambulatory Visit: Payer: Self-pay | Admitting: *Deleted

## 2014-06-28 MED ORDER — PAROXETINE HCL 40 MG PO TABS: 40.0000 mg | ORAL_TABLET | Freq: Every morning | ORAL | Status: DC

## 2014-10-09 ENCOUNTER — Encounter: Payer: Self-pay | Admitting: Internal Medicine

## 2014-10-10 NOTE — Telephone Encounter (Signed)
Please call pt and schedule an appointment this week or next for problems.

## 2014-10-10 NOTE — Telephone Encounter (Signed)
LMOM for pt to schedule an appointment.

## 2014-10-12 ENCOUNTER — Ambulatory Visit (INDEPENDENT_AMBULATORY_CARE_PROVIDER_SITE_OTHER): Payer: BC Managed Care – PPO | Admitting: Internal Medicine

## 2014-10-12 ENCOUNTER — Encounter: Payer: Self-pay | Admitting: Internal Medicine

## 2014-10-12 VITALS — BP 120/78 | HR 68 | Temp 98.2°F | Resp 20 | Ht 67.5 in | Wt 192.0 lb

## 2014-10-12 DIAGNOSIS — E669 Obesity, unspecified: Secondary | ICD-10-CM

## 2014-10-12 DIAGNOSIS — R7302 Impaired glucose tolerance (oral): Secondary | ICD-10-CM | POA: Diagnosis not present

## 2014-10-12 DIAGNOSIS — I471 Supraventricular tachycardia: Secondary | ICD-10-CM | POA: Diagnosis not present

## 2014-10-12 DIAGNOSIS — I10 Essential (primary) hypertension: Secondary | ICD-10-CM

## 2014-10-12 LAB — HEMOGLOBIN A1C: Hgb A1c MFr Bld: 5.2 % (ref 4.6–6.5)

## 2014-10-12 MED ORDER — ALPRAZOLAM 0.25 MG PO TABS: 0.2500 mg | ORAL_TABLET | Freq: Two times a day (BID) | ORAL | Status: DC

## 2014-10-12 MED ORDER — METOPROLOL SUCCINATE ER 25 MG PO TB24: 25.0000 mg | ORAL_TABLET | Freq: Every day | ORAL | Status: DC

## 2014-10-12 NOTE — Patient Instructions (Signed)
Decrease metoprolol to 25 mg daily  Please check your blood pressure on a regular basis.  If it is consistently greater than 150/90, please make an office appointment.  Return in 6 months for follow-up

## 2014-10-12 NOTE — Progress Notes (Signed)
Pre visit review using our clinic review tool, if applicable. No additional management support is needed unless otherwise documented below in the visit note. 

## 2014-10-12 NOTE — Progress Notes (Signed)
Subjective:    Patient ID: Glenda [name], female    DOB: [birth year], 64 y.o.   MRN: [id number]  HPI  Wt Readings from Last 3 Encounters:  10/12/14 192 lb (87.091 kg)  04/06/14 236 lb (107.049 kg)  08/31/13 235 lb (106.73 kg)   64 year old patient who has a history of exogenous obesity as well as essential hypertension;  over the past several months.  She has had a dramatic voluntary weight loss of approximately 45 pounds.  She does monitor home blood pressure readings which have been as low as 80 over 40.  She has had significant orthostatic symptoms and she has self discontinued Lotensin HCT approximately 3 weeks ago.  Blood pressures have been stable.  She continues to have very mild orthostatic symptoms, but normal blood pressure readings. She remains on metoprolol and does have a long history of palpitations since her young adult years.  These continue to be mildly symptomatic  Past Medical History  Diagnosis Date  . Hypertension   . History of colonic polyps   . Tobacco user   . History of PSVT (paroxysmal supraventricular tachycardia)   . Allergic rhinitis   . Impaired glucose tolerance   . Anxiety   . Obesity   . Vertigo     started 3 weeks ago- after fluid trapped in ear    History   Social History  . Marital Status: Married    Spouse Name: N/A  . Number of Children: N/A  . Years of Education: N/A   Occupational History  . Not on file.   Social History Main Topics  . Smoking status: Current Every Day Smoker -- 0.50 packs/day    Types: Cigarettes  . Smokeless tobacco: Never Used  . Alcohol Use: Yes     Comment: one drink 5 times a week  . Drug Use: No  . Sexual Activity: Not on file   Other Topics Concern  . Not on file   Social History Narrative    Past Surgical History  Procedure Laterality Date  . Cataract extraction      both eyes  . Ankle sx-left fx    . Foot sx      with the ankle surgery  . Colonoscopy  04/23/2005    Family History    Problem Relation Age of Onset  . Healthy Mother   . Dementia Father   . Healthy Sister   . Alzheimer's disease Other   . Healthy Sister   . Colon cancer Neg Hx   . Esophageal cancer Neg Hx   . Rectal cancer Neg Hx   . Stomach cancer Neg Hx     Allergies  Allergen Reactions  . Amoxicillin     REACTION: Rash  . Erythromycin     REACTION: Upset stomach    Current Outpatient Prescriptions on File Prior to Visit  Medication Sig Dispense Refill  . aspirin (BAYER LOW STRENGTH) 81 MG EC tablet Take 81 mg by mouth daily.      . DiphenhydrAMINE HCl (BENADRYL ALLERGY PO) Take by mouth as needed.    . fluticasone (FLONASE) 50 MCG/ACT nasal spray Place 2 sprays into both nostrils daily. 16 g 6  . PARoxetine (PAXIL) 40 MG tablet Take 1 tablet (40 mg total) by mouth every morning. 90 tablet 2   No current facility-administered medications on file prior to visit.    BP 120/78 mmHg  Pulse 68  Temp(Src) 98.2 F (36.8 C) (Oral)  Resp 20  Ht  5' 7.5" (1.715 m)  Wt 192 lb (87.091 kg)  BMI 29.61 kg/m2  SpO2 98%    Review of Systems  Constitutional: Positive for unexpected weight change.  HENT: Negative for congestion, dental problem, hearing loss, rhinorrhea, sinus pressure, sore throat and tinnitus.   Eyes: Negative for pain, discharge and visual disturbance.  Respiratory: Negative for cough and shortness of breath.   Cardiovascular: Negative for chest pain, palpitations and leg swelling.  Gastrointestinal: Negative for nausea, vomiting, abdominal pain, diarrhea, constipation, blood in stool and abdominal distention.  Genitourinary: Negative for dysuria, urgency, frequency, hematuria, flank pain, vaginal bleeding, vaginal discharge, difficulty urinating, vaginal pain and pelvic pain.  Musculoskeletal: Negative for joint swelling, arthralgias and gait problem.  Skin: Negative for rash.  Neurological: Positive for dizziness and light-headedness. Negative for syncope, speech difficulty,  weakness, numbness and headaches.  Hematological: Negative for adenopathy.  Psychiatric/Behavioral: Negative for behavioral problems, dysphoric mood and agitation. The patient is not nervous/anxious.        Objective:   Physical Exam  Constitutional: She is oriented to person, place, and time. She appears well-developed and well-nourished.  Blood pressure 120/74  HENT:  Head: Normocephalic.  Right Ear: External ear normal.  Left Ear: External ear normal.  Mouth/Throat: Oropharynx is clear and moist.  Eyes: Conjunctivae and EOM are normal. Pupils are equal, round, and reactive to light.  Neck: Normal range of motion. Neck supple. No thyromegaly present.  Cardiovascular: Normal rate, regular rhythm, normal heart sounds and intact distal pulses.   Pulmonary/Chest: Effort normal and breath sounds normal.  Abdominal: Soft. Bowel sounds are normal. She exhibits no mass. There is no tenderness.  Musculoskeletal: Normal range of motion.  Lymphadenopathy:    She has no cervical adenopathy.  Neurological: She is alert and oriented to person, place, and time.  Skin: Skin is warm and dry. No rash noted.  Psychiatric: She has a normal mood and affect. Her behavior is normal.          Assessment & Plan:   Voluntary weight loss. History of hypertension.  Patient now normotensive off medication.  We'll decrease metoprolol to 25 mg daily for treatment of palpitations.  If palpitations increase.  Will resume 50 mg dose Anxiety disorder.  Alprazolam refilled Obesity.  Much improved  Recheck 6 months or as needed  Impaired glucose tolerance.  We'll check a hemoglobin A1c

## 2014-10-16 ENCOUNTER — Encounter: Payer: Self-pay | Admitting: Internal Medicine

## 2014-10-18 ENCOUNTER — Telehealth: Payer: Self-pay

## 2014-10-18 DIAGNOSIS — Z139 Encounter for screening, unspecified: Secondary | ICD-10-CM

## 2014-10-18 NOTE — Telephone Encounter (Signed)
Pt states it has been several years.  Pt would like an order to be placed for the [facility] and someone call her to set it up.

## 2014-10-18 NOTE — Telephone Encounter (Signed)
Order for Mammogram done.

## 2014-10-18 NOTE — Telephone Encounter (Signed)
Left message to call back (concerning overdue mammogram)

## 2014-10-18 NOTE — Addendum Note (Signed)
Addended by: Marian Sorrow on: 10/18/2014 04:15 PM   Modules accepted: Orders

## 2015-03-22 ENCOUNTER — Ambulatory Visit (INDEPENDENT_AMBULATORY_CARE_PROVIDER_SITE_OTHER): Payer: BC Managed Care – PPO

## 2015-03-22 DIAGNOSIS — Z23 Encounter for immunization: Secondary | ICD-10-CM

## 2015-04-01 ENCOUNTER — Other Ambulatory Visit (INDEPENDENT_AMBULATORY_CARE_PROVIDER_SITE_OTHER): Payer: BC Managed Care – PPO

## 2015-04-01 DIAGNOSIS — Z0000 Encounter for general adult medical examination without abnormal findings: Secondary | ICD-10-CM | POA: Diagnosis not present

## 2015-04-01 LAB — HEPATIC FUNCTION PANEL
ALBUMIN: 3.9 g/dL (ref 3.5–5.2)
ALK PHOS: 72 U/L (ref 39–117)
ALT: 12 U/L (ref 0–35)
AST: 13 U/L (ref 0–37)
Bilirubin, Direct: 0.1 mg/dL (ref 0.0–0.3)
TOTAL PROTEIN: 6.9 g/dL (ref 6.0–8.3)
Total Bilirubin: 0.9 mg/dL (ref 0.2–1.2)

## 2015-04-01 LAB — TSH: TSH: 2.03 u[IU]/mL (ref 0.35–4.50)

## 2015-04-01 LAB — BASIC METABOLIC PANEL
BUN: 17 mg/dL (ref 6–23)
CO2: 28 mEq/L (ref 19–32)
Calcium: 9.8 mg/dL (ref 8.4–10.5)
Chloride: 107 mEq/L (ref 96–112)
Creatinine, Ser: 0.6 mg/dL (ref 0.40–1.20)
GFR: 106.88 mL/min (ref >60.00)
Glucose, Bld: 96 mg/dL (ref 70–99)
POTASSIUM: 5 meq/L (ref 3.5–5.1)
Sodium: 144 mEq/L (ref 135–145)

## 2015-04-01 LAB — CBC WITH DIFFERENTIAL/PLATELET
Basophils Absolute: 0 10*3/uL (ref 0.0–0.1)
Basophils Relative: 0.5 % (ref 0.0–3.0)
EOS ABS: 0.2 10*3/uL (ref 0.0–0.7)
Eosinophils Relative: 4 % (ref 0.0–5.0)
HCT: 41.4 % (ref 36.0–46.0)
Hemoglobin: 13.7 g/dL (ref 12.0–15.0)
LYMPHS PCT: 27.6 % (ref 12.0–46.0)
Lymphs Abs: 1.5 10*3/uL (ref 0.7–4.0)
MCHC: 33 g/dL (ref 30.0–36.0)
MCV: 94.4 fl (ref 78.0–100.0)
MONO ABS: 0.4 10*3/uL (ref 0.1–1.0)
Monocytes Relative: 8 % (ref 3.0–12.0)
Neutro Abs: 3.3 10*3/uL (ref 1.4–7.7)
Neutrophils Relative %: 59.9 % (ref 43.0–77.0)
Platelets: 282 10*3/uL (ref 150.0–400.0)
RBC: 4.38 Mil/uL (ref 3.87–5.11)
RDW: 13.1 % (ref 11.5–15.5)
WBC: 5.6 10*3/uL (ref 4.0–10.5)

## 2015-04-01 LAB — POCT URINALYSIS DIPSTICK
BILIRUBIN UA: NEGATIVE
Glucose, UA: NEGATIVE
KETONES UA: NEGATIVE
NITRITE UA: NEGATIVE
Protein, UA: NEGATIVE
RBC UA: NEGATIVE
Spec Grav, UA: 1.01
UROBILINOGEN UA: 0.2
pH, UA: 6

## 2015-04-01 LAB — LIPID PANEL
Cholesterol: 172 mg/dL (ref 0–200)
HDL: 39.8 mg/dL (ref >39.00)
LDL Cholesterol: 117 mg/dL — ABNORMAL HIGH (ref 0–99)
NONHDL: 132.65
Total CHOL/HDL Ratio: 4
Triglycerides: 78 mg/dL (ref 0.0–149.0)
VLDL: 15.6 mg/dL (ref 0.0–40.0)

## 2015-04-01 LAB — HEMOGLOBIN A1C: Hgb A1c MFr Bld: 5.4 % (ref 4.6–6.5)

## 2015-04-08 ENCOUNTER — Encounter: Payer: BC Managed Care – PPO | Admitting: Internal Medicine

## 2015-04-29 ENCOUNTER — Encounter: Payer: Self-pay | Admitting: Internal Medicine

## 2015-04-29 ENCOUNTER — Ambulatory Visit (INDEPENDENT_AMBULATORY_CARE_PROVIDER_SITE_OTHER): Payer: BC Managed Care – PPO | Admitting: Internal Medicine

## 2015-04-29 VITALS — BP 122/74 | HR 73 | Temp 98.5°F | Ht 66.73 in | Wt 184.0 lb

## 2015-04-29 DIAGNOSIS — R7302 Impaired glucose tolerance (oral): Secondary | ICD-10-CM | POA: Diagnosis not present

## 2015-04-29 DIAGNOSIS — I10 Essential (primary) hypertension: Secondary | ICD-10-CM | POA: Diagnosis not present

## 2015-04-29 DIAGNOSIS — Z0000 Encounter for general adult medical examination without abnormal findings: Secondary | ICD-10-CM

## 2015-04-29 DIAGNOSIS — Z86010 Personal history of colonic polyps: Secondary | ICD-10-CM | POA: Diagnosis not present

## 2015-04-29 MED ORDER — ALPRAZOLAM 0.25 MG PO TABS: 0.2500 mg | ORAL_TABLET | Freq: Two times a day (BID) | ORAL | Status: DC

## 2015-04-29 NOTE — Progress Notes (Signed)
Pre visit review using our clinic review tool, if applicable. No additional management support is needed unless otherwise documented below in the visit note. 

## 2015-04-29 NOTE — Progress Notes (Signed)
Subjective:    Patient ID: Glenda [name], female    DOB: 1950-09-06, 64 y.o.   MRN: [id number]  HPI   Subjective:    Patient ID: Glenda [name], female    DOB: [birth year], 64 y.o.   MRN: [id number]  HPI 64 year-old patient seen today for annual exam.  She has a history of anxiety disorder hypertension impaired glucose tolerance. She has done quite well. No concerns or complaints. She also has allergic rhinitis which has been stable. She also has a history of colonic polyps and last colonoscopy 2015.  Family history mother age 25  and is remarkably well status post CABG. Father died at 52 of advanced senile dementia. One brother died at age 34 and may have had congenital heart disease. 2 sisters are well.  Lab Results  Component Value Date   HGBA1C 5.4 04/01/2015     Past Medical History  Diagnosis Date  . Hypertension   . History of colonic polyps   . Tobacco user   . History of PSVT (paroxysmal supraventricular tachycardia)   . Allergic rhinitis   . Impaired glucose tolerance   . Anxiety   . Obesity   . Vertigo     started 3 weeks ago- after fluid trapped in ear    Social History   Social History  . Marital Status: Married    Spouse Name: N/A  . Number of Children: N/A  . Years of Education: N/A   Occupational History  . Not on file.   Social History Main Topics  . Smoking status: Current Every Day Smoker -- 0.50 packs/day    Types: Cigarettes  . Smokeless tobacco: Never Used  . Alcohol Use: Yes     Comment: one drink 5 times a week  . Drug Use: No  . Sexual Activity: Not on file   Other Topics Concern  . Not on file   Social History Narrative    Past Surgical History  Procedure Laterality Date  . Cataract extraction      both eyes  . Ankle sx-left fx    . Foot sx      with the ankle surgery  . Colonoscopy  04/23/2005    Family History  Problem Relation Age of Onset  . Healthy Mother   . Dementia Father   . Healthy Sister   . Alzheimer's  disease Other   . Healthy Sister   . Colon cancer Neg Hx   . Esophageal cancer Neg Hx   . Rectal cancer Neg Hx   . Stomach cancer Neg Hx     Allergies  Allergen Reactions  . Amoxicillin     REACTION: Rash  . Erythromycin     REACTION: Upset stomach    Current Outpatient Prescriptions on File Prior to Visit  Medication Sig Dispense Refill  . ALPRAZolam (XANAX) 0.25 MG tablet Take 1 tablet (0.25 mg total) by mouth 2 (two) times daily. 60 tablet 4  . aspirin (BAYER LOW STRENGTH) 81 MG EC tablet Take 81 mg by mouth daily.      . DiphenhydrAMINE HCl (BENADRYL ALLERGY PO) Take by mouth as needed.    . fluticasone (FLONASE) 50 MCG/ACT nasal spray Place 2 sprays into both nostrils daily. 16 g 6  . metoprolol succinate (TOPROL-XL) 25 MG 24 hr tablet Take 1 tablet (25 mg total) by mouth daily. Take one tablet by mouth one time daily 90 tablet 3  . PARoxetine (PAXIL) 40 MG tablet Take 1 tablet (  40 mg total) by mouth every morning. 90 tablet 2   No current facility-administered medications on file prior to visit.    BP 122/74 mmHg  Pulse 73  Temp(Src) 98.5 F (36.9 C) (Oral)  Ht 5' 6.73" (1.695 m)  Wt 184 lb (83.462 kg)  BMI 29.05 kg/m2  SpO2 98%      Review of Systems  Constitutional: Negative.   HENT: Negative for congestion, dental problem, hearing loss, rhinorrhea, sinus pressure, sore throat and tinnitus.   Eyes: Negative for pain, discharge and visual disturbance.  Respiratory: Negative for cough and shortness of breath.   Cardiovascular: Negative for chest pain, palpitations and leg swelling.  Gastrointestinal: Negative for nausea, vomiting, abdominal pain, diarrhea, constipation, blood in stool and abdominal distention.  Genitourinary: Negative for dysuria, urgency, frequency, hematuria, flank pain, vaginal bleeding, vaginal discharge, difficulty urinating, vaginal pain and pelvic pain.  Musculoskeletal: Negative for joint swelling, arthralgias and gait problem.  Skin:  Negative for rash.  Neurological: Negative for dizziness, syncope, speech difficulty, weakness, numbness and headaches.  Hematological: Negative for adenopathy.  Psychiatric/Behavioral: Negative for behavioral problems, dysphoric mood and agitation. The patient is not nervous/anxious.        Objective:   Physical Exam  Constitutional: She is oriented to person, place, and time. She appears well-developed and well-nourished.  HENT:  Head: Normocephalic and atraumatic.  Right Ear: External ear normal.  Left Ear: External ear normal.  Mouth/Throat: Oropharynx is clear and moist.  Eyes: Conjunctivae and EOM are normal.  Neck: Normal range of motion. Neck supple. No JVD present. No thyromegaly present.  Cardiovascular: Normal rate, regular rhythm, normal heart sounds and intact distal pulses.   No murmur heard. Pulmonary/Chest: Effort normal and breath sounds normal. She has no wheezes. She has no rales.  Abdominal: Soft. Bowel sounds are normal. She exhibits no distension and no mass. There is no tenderness. There is no rebound and no guarding.  Musculoskeletal: Normal range of motion. She exhibits no edema or tenderness.  Neurological: She is alert and oriented to person, place, and time. She has normal reflexes. No cranial nerve deficit. She exhibits normal muscle tone. Coordination normal.  Skin: Skin is warm and dry. No rash noted.  Psychiatric: She has a normal mood and affect. Her behavior is normal.          Assessment & Plan:   Hypertension well controlled Allergic rhinitis stable Exogenous obesity and history of impaired glucose tolerance  We'll reassess in one year Weight loss exercise encouraged Followup colonoscopy in 2020.  Impaired glucose tolerance.  We'll check a hemoglobin A1c  Review of Systems  As above    Objective:   Physical Exam  As above        Assessment & Plan:   As above

## 2015-04-29 NOTE — Patient Instructions (Addendum)
Limit your sodium (Salt) intake  Please check your blood pressure on a regular basis.  If it is consistently greater than 150/90, please make an office appointment.    It is important that you exercise regularly, at least 20 minutes 3 to 4 times per week.  If you develop chest pain or shortness of breath seek  medical attention.  Return in one year for follow-up Menopause is a normal process in which your reproductive ability comes to an end. This process happens gradually over a span of months to years, usually between the ages of 86 and 52. Menopause is complete when you have missed 12 consecutive menstrual periods. It is important to talk with your health care provider about some of the most common conditions that affect postmenopausal women, such as heart disease, cancer, and bone loss (osteoporosis). Adopting a healthy lifestyle and getting preventive care can help to promote your health and wellness. Those actions can also lower your chances of developing some of these common conditions. WHAT SHOULD I KNOW ABOUT MENOPAUSE? During menopause, you may experience a number of symptoms, such as:  Moderate-to-severe hot flashes.  Night sweats.  Decrease in sex drive.  Mood swings.  Headaches.  Tiredness.  Irritability.  Memory problems.  Insomnia. Choosing to treat or not to treat menopausal changes is an individual decision that you make with your health care provider. WHAT SHOULD I KNOW ABOUT HORMONE REPLACEMENT THERAPY AND SUPPLEMENTS? Hormone therapy products are effective for treating symptoms that are associated with menopause, such as hot flashes and night sweats. Hormone replacement carries certain risks, especially as you become older. If you are thinking about using estrogen or estrogen with progestin treatments, discuss the benefits and risks with your health care provider. WHAT SHOULD I KNOW ABOUT HEART DISEASE AND STROKE? Heart disease, heart attack, and stroke become  more likely as you age. This may be due, in part, to the hormonal changes that your body experiences during menopause. These can affect how your body processes dietary fats, triglycerides, and cholesterol. Heart attack and stroke are both medical emergencies. There are many things that you can do to help prevent heart disease and stroke:  Have your blood pressure checked at least every 1-2 years. High blood pressure causes heart disease and increases the risk of stroke.  If you are 85-53 years old, ask your health care provider if you should take aspirin to prevent a heart attack or a stroke.  Do not use any tobacco products, including cigarettes, chewing tobacco, or electronic cigarettes. If you need help quitting, ask your health care provider.  It is important to eat a healthy diet and maintain a healthy weight.  Be sure to include plenty of vegetables, fruits, low-fat dairy products, and lean protein.  Avoid eating foods that are high in solid fats, added sugars, or salt (sodium).  Get regular exercise. This is one of the most important things that you can do for your health.  Try to exercise for at least 150 minutes each week. The type of exercise that you do should increase your heart rate and make you sweat. This is known as moderate-intensity exercise.  Try to do strengthening exercises at least twice each week. Do these in addition to the moderate-intensity exercise.  Know your numbers.Ask your health care provider to check your cholesterol and your blood glucose. Continue to have your blood tested as directed by your health care provider. WHAT SHOULD I KNOW ABOUT CANCER SCREENING? There are several types of  cancer. Take the following steps to reduce your risk and to catch any cancer development as early as possible. Breast Cancer  Practice breast self-awareness.  This means understanding how your breasts normally appear and feel.  It also means doing regular breast  self-exams. Let your health care provider know about any changes, no matter how small.  If you are 80 or older, have a clinician do a breast exam (clinical breast exam or CBE) every year. Depending on your age, family history, and medical history, it may be recommended that you also have a yearly breast X-ray (mammogram).  If you have a family history of breast cancer, talk with your health care provider about genetic screening.  If you are at high risk for breast cancer, talk with your health care provider about having an MRI and a mammogram every year.  Breast cancer (BRCA) gene test is recommended for women who have family members with BRCA-related cancers. Results of the assessment will determine the need for genetic counseling and BRCA1 and for BRCA2 testing. BRCA-related cancers include these types:  Breast. This occurs in males or females.  Ovarian.  Tubal. This may also be called fallopian tube cancer.  Cancer of the abdominal or pelvic lining (peritoneal cancer).  Prostate.  Pancreatic. Cervical, Uterine, and Ovarian Cancer Your health care provider may recommend that you be screened regularly for cancer of the pelvic organs. These include your ovaries, uterus, and vagina. This screening involves a pelvic exam, which includes checking for microscopic changes to the surface of your cervix (Pap test).  For women ages 21-65, health care providers may recommend a pelvic exam and a Pap test every three years. For women ages 44-65, they may recommend the Pap test and pelvic exam, combined with testing for human papilloma virus (HPV), every five years. Some types of HPV increase your risk of cervical cancer. Testing for HPV may also be done on women of any age who have unclear Pap test results.  Other health care providers may not recommend any screening for nonpregnant women who are considered low risk for pelvic cancer and have no symptoms. Ask your health care provider if a screening  pelvic exam is right for you.  If you have had past treatment for cervical cancer or a condition that could lead to cancer, you need Pap tests and screening for cancer for at least 20 years after your treatment. If Pap tests have been discontinued for you, your risk factors (such as having a new sexual partner) need to be reassessed to determine if you should start having screenings again. Some women have medical problems that increase the chance of getting cervical cancer. In these cases, your health care provider may recommend that you have screening and Pap tests more often.  If you have a family history of uterine cancer or ovarian cancer, talk with your health care provider about genetic screening.  If you have vaginal bleeding after reaching menopause, tell your health care provider.  There are currently no reliable tests available to screen for ovarian cancer. Lung Cancer Lung cancer screening is recommended for adults 2-45 years old who are at high risk for lung cancer because of a history of smoking. A yearly low-dose CT scan of the lungs is recommended if you:  Currently smoke.  Have a history of at least 30 pack-years of smoking and you currently smoke or have quit within the past 15 years. A pack-year is smoking an average of one pack of cigarettes per  day for one year. Yearly screening should:  Continue until it has been 15 years since you quit.  Stop if you develop a health problem that would prevent you from having lung cancer treatment. Colorectal Cancer  This type of cancer can be detected and can often be prevented.  Routine colorectal cancer screening usually begins at age 35 and continues through age 74.  If you have risk factors for colon cancer, your health care provider may recommend that you be screened at an earlier age.  If you have a family history of colorectal cancer, talk with your health care provider about genetic screening.  Your health care provider  may also recommend using home test kits to check for hidden blood in your stool.  A small camera at the end of a tube can be used to examine your colon directly (sigmoidoscopy or colonoscopy). This is done to check for the earliest forms of colorectal cancer.  Direct examination of the colon should be repeated every 5-10 years until age 52. However, if early forms of precancerous polyps or small growths are found or if you have a family history or genetic risk for colorectal cancer, you may need to be screened more often. Skin Cancer  Check your skin from head to toe regularly.  Monitor any moles. Be sure to tell your health care provider:  About any new moles or changes in moles, especially if there is a change in a mole's shape or color.  If you have a mole that is larger than the size of a pencil eraser.  If any of your family members has a history of skin cancer, especially at a young age, talk with your health care provider about genetic screening.  Always use sunscreen. Apply sunscreen liberally and repeatedly throughout the day.  Whenever you are outside, protect yourself by wearing long sleeves, pants, a wide-brimmed hat, and sunglasses. WHAT SHOULD I KNOW ABOUT OSTEOPOROSIS? Osteoporosis is a condition in which bone destruction happens more quickly than new bone creation. After menopause, you may be at an increased risk for osteoporosis. To help prevent osteoporosis or the bone fractures that can happen because of osteoporosis, the following is recommended:  If you are 30-15 years old, get at least 1,000 mg of calcium and at least 600 mg of vitamin D per day.  If you are older than age 43 but younger than age 18, get at least 1,200 mg of calcium and at least 600 mg of vitamin D per day.  If you are older than age 66, get at least 1,200 mg of calcium and at least 800 mg of vitamin D per day. Smoking and excessive alcohol intake increase the risk of osteoporosis. Eat foods that are  rich in calcium and vitamin D, and do weight-bearing exercises several times each week as directed by your health care provider. WHAT SHOULD I KNOW ABOUT HOW MENOPAUSE AFFECTS [place]? Depression may occur at any age, but it is more common as you become older. Common symptoms of depression include:  Low or sad mood.  Changes in sleep patterns.  Changes in appetite or eating patterns.  Feeling an overall lack of motivation or enjoyment of activities that you previously enjoyed.  Frequent crying spells. Talk with your health care provider if you think that you are experiencing depression. WHAT SHOULD I KNOW ABOUT IMMUNIZATIONS? It is important that you get and maintain your immunizations. These include:  Tetanus, diphtheria, and pertussis (Tdap) booster vaccine.  Influenza every  Influenza every year before the flu season begins.  Pneumonia vaccine.  Shingles vaccine. Your health care provider may also recommend other immunizations.   This information is not intended to replace advice given to you by your health care provider. Make sure you discuss any questions you have with your health care provider.   Document Released: 06/26/2005 Document Revised: 05/25/2014 Document Reviewed: 01/04/2014 Elsevier Interactive Patient Education 2016 [employer]  

## 2015-07-24 ENCOUNTER — Other Ambulatory Visit: Payer: Self-pay | Admitting: Internal Medicine

## 2015-08-04 ENCOUNTER — Encounter: Payer: Self-pay | Admitting: Internal Medicine

## 2015-08-05 NOTE — Telephone Encounter (Signed)
Please call pt and schedule an appt

## 2015-08-05 NOTE — Telephone Encounter (Signed)
Pt has been sch for tomorrow °

## 2015-08-06 ENCOUNTER — Encounter: Payer: Self-pay | Admitting: Internal Medicine

## 2015-08-06 ENCOUNTER — Ambulatory Visit (INDEPENDENT_AMBULATORY_CARE_PROVIDER_SITE_OTHER): Payer: BC Managed Care – PPO | Admitting: Internal Medicine

## 2015-08-06 DIAGNOSIS — I10 Essential (primary) hypertension: Secondary | ICD-10-CM

## 2015-08-06 DIAGNOSIS — M25551 Pain in right hip: Secondary | ICD-10-CM

## 2015-08-06 MED ORDER — METHYLPREDNISOLONE ACETATE 80 MG/ML IJ SUSP
80.0000 mg | Freq: Once | INTRAMUSCULAR | Status: AC
  Administered 2015-08-06: 80 mg via INTRAMUSCULAR

## 2015-08-06 NOTE — Patient Instructions (Signed)
You  may move around, but avoid painful motions and activities.  Apply heat to the sore area for 15 to 20 minutes 3 or 4 times daily for the next two to 3 days. 

## 2015-08-06 NOTE — Addendum Note (Signed)
Addended by: Marian Sorrow on: 08/06/2015 04:36 PM   Modules accepted: Orders

## 2015-08-06 NOTE — Progress Notes (Signed)
Pre visit review using our clinic review tool, if applicable. No additional management support is needed unless otherwise documented below in the visit note. 

## 2015-08-06 NOTE — Progress Notes (Signed)
Subjective:    Patient ID: Glenda [name], female    DOB: December 31, 1950, 65 y.o.   MRN: [id number]  HPI  65 year old patient who has a two-year history of intermittent mild right hip discomfort.  The past 5 days she has had worsening right hip pain with radiation to the right anterior thigh.  Pain began last week during a 5 hour car ride to [facility].  Pain was alleviated by laying flat and aggravated by the sitting position.  She spent 2 days in [place] with active walking over 2 days without any discomfort, but had recurrent pain.  Sitting for the trip back.  She has no difficulty with sleeping or walking, but continues to have significant discomfort with prolonged sitting.  She describes pain and some cramping in the right anterior thigh She is quite active with yoga and stretching exercises  Past Medical History  Diagnosis Date  . Hypertension   . History of colonic polyps   . Tobacco user   . History of PSVT (paroxysmal supraventricular tachycardia)   . Allergic rhinitis   . Impaired glucose tolerance   . Anxiety   . Obesity   . Vertigo     started 3 weeks ago- after fluid trapped in ear    Social History   Social History  . Marital Status: Married    Spouse Name: N/A  . Number of Children: N/A  . Years of Education: N/A   Occupational History  . Not on file.   Social History Main Topics  . Smoking status: Current Every Day Smoker -- 0.50 packs/day    Types: Cigarettes  . Smokeless tobacco: Never Used  . Alcohol Use: Yes     Comment: one drink 5 times a week  . Drug Use: No  . Sexual Activity: Not on file   Other Topics Concern  . Not on file   Social History Narrative    Past Surgical History  Procedure Laterality Date  . Cataract extraction      both eyes  . Ankle sx-left fx    . Foot sx      with the ankle surgery  . Colonoscopy  04/23/2005    Family History  Problem Relation Age of Onset  . Healthy Mother   . Dementia Father   . Healthy Sister     . Alzheimer's disease Other   . Healthy Sister   . Colon cancer Neg Hx   . Esophageal cancer Neg Hx   . Rectal cancer Neg Hx   . Stomach cancer Neg Hx     Allergies  Allergen Reactions  . Amoxicillin     REACTION: Rash  . Erythromycin     REACTION: Upset stomach    Current Outpatient Prescriptions on File Prior to Visit  Medication Sig Dispense Refill  . ALPRAZolam (XANAX) 0.25 MG tablet Take 1 tablet (0.25 mg total) by mouth 2 (two) times daily. 60 tablet 4  . aspirin (BAYER LOW STRENGTH) 81 MG EC tablet Take 81 mg by mouth daily.      . DiphenhydrAMINE HCl (BENADRYL ALLERGY PO) Take by mouth as needed.    . fluticasone (FLONASE) 50 MCG/ACT nasal spray Place 2 sprays into both nostrils daily. 16 g 6  . metoprolol succinate (TOPROL-XL) 25 MG 24 hr tablet Take 1 tablet (25 mg total) by mouth daily. Take one tablet by mouth one time daily 90 tablet 3  . PARoxetine (PAXIL) 40 MG tablet TAKE ONE TABLET BY MOUTH EVERY MORNING  90 tablet 3   No current facility-administered medications on file prior to visit.    BP 100/70 mmHg  Pulse 83  Temp(Src) 98.5 F (36.9 C) (Oral)  Resp 20  Ht 5' 6.75" (1.695 m)  Wt 184 lb (83.462 kg)  BMI 29.05 kg/m2  SpO2 96%    Review of Systems  Constitutional: Negative.   HENT: Negative for congestion, dental problem, hearing loss, rhinorrhea, sinus pressure, sore throat and tinnitus.   Eyes: Negative for pain, discharge and visual disturbance.  Respiratory: Negative for cough and shortness of breath.   Cardiovascular: Negative for chest pain, palpitations and leg swelling.  Gastrointestinal: Negative for nausea, vomiting, abdominal pain, diarrhea, constipation, blood in stool and abdominal distention.  Genitourinary: Negative for dysuria, urgency, frequency, hematuria, flank pain, vaginal bleeding, vaginal discharge, difficulty urinating, vaginal pain and pelvic pain.  Musculoskeletal: Negative for joint swelling, arthralgias and gait problem.        Right hip pain  Skin: Negative for rash.  Neurological: Negative for dizziness, syncope, speech difficulty, weakness, numbness and headaches.  Hematological: Negative for adenopathy.  Psychiatric/Behavioral: Negative for behavioral problems, dysphoric mood and agitation. The patient is not nervous/anxious.        Objective:   Physical Exam  Constitutional: She appears well-developed and well-nourished. No distress.  Musculoskeletal:  Uncomfortable with sitting No pain in the supine position Negative straight leg test Full range of motion both hips Reflexes brisk and equal No motor weakness No tenderness over the right greater trochanter          Assessment & Plan:   Right hip pain.  Possible bursitis.  She is quite uncomfortable for the past few days.  Will treat with Depo-Medrol 80 and observe Hypertension, stable

## 2015-11-25 ENCOUNTER — Other Ambulatory Visit: Payer: Self-pay | Admitting: Internal Medicine

## 2015-12-09 ENCOUNTER — Other Ambulatory Visit: Payer: Self-pay | Admitting: Internal Medicine

## 2016-04-02 ENCOUNTER — Ambulatory Visit (INDEPENDENT_AMBULATORY_CARE_PROVIDER_SITE_OTHER): Payer: Medicare Other

## 2016-04-02 DIAGNOSIS — Z23 Encounter for immunization: Secondary | ICD-10-CM | POA: Diagnosis not present

## 2016-04-20 ENCOUNTER — Other Ambulatory Visit (INDEPENDENT_AMBULATORY_CARE_PROVIDER_SITE_OTHER): Payer: Medicare Other

## 2016-04-20 DIAGNOSIS — Z0000 Encounter for general adult medical examination without abnormal findings: Secondary | ICD-10-CM

## 2016-04-20 LAB — CBC WITH DIFFERENTIAL/PLATELET
BASOS PCT: 0.7 % (ref 0.0–3.0)
Basophils Absolute: 0 10*3/uL (ref 0.0–0.1)
EOS PCT: 6.1 % — AB (ref 0.0–5.0)
Eosinophils Absolute: 0.3 10*3/uL (ref 0.0–0.7)
HCT: 41.3 % (ref 36.0–46.0)
Hemoglobin: 13.9 g/dL (ref 12.0–15.0)
LYMPHS ABS: 1.5 10*3/uL (ref 0.7–4.0)
Lymphocytes Relative: 28.2 % (ref 12.0–46.0)
MCHC: 33.7 g/dL (ref 30.0–36.0)
MCV: 93.7 fl (ref 78.0–100.0)
MONO ABS: 0.4 10*3/uL (ref 0.1–1.0)
MONOS PCT: 8.4 % (ref 3.0–12.0)
NEUTROS PCT: 56.6 % (ref 43.0–77.0)
Neutro Abs: 3 10*3/uL (ref 1.4–7.7)
Platelets: 340 10*3/uL (ref 150.0–400.0)
RBC: 4.4 Mil/uL (ref 3.87–5.11)
RDW: 12.9 % (ref 11.5–15.5)
WBC: 5.3 10*3/uL (ref 4.0–10.5)

## 2016-04-20 LAB — POC URINALSYSI DIPSTICK (AUTOMATED)
Blood, UA: NEGATIVE
Glucose, UA: NEGATIVE
Ketones, UA: NEGATIVE
LEUKOCYTES UA: NEGATIVE
NITRITE UA: NEGATIVE
PH UA: 5.5
PROTEIN UA: NEGATIVE
Spec Grav, UA: 1.015
Urobilinogen, UA: 0.2

## 2016-04-20 LAB — LIPID PANEL
CHOLESTEROL: 175 mg/dL (ref 0–200)
HDL: 37.3 mg/dL — ABNORMAL LOW (ref >39.00)
LDL CALC: 120 mg/dL — AB (ref 0–99)
NonHDL: 137.24
TRIGLYCERIDES: 85 mg/dL (ref 0.0–149.0)
Total CHOL/HDL Ratio: 5
VLDL: 17 mg/dL (ref 0.0–40.0)

## 2016-04-20 LAB — BASIC METABOLIC PANEL
BUN: 18 mg/dL (ref 6–23)
CHLORIDE: 106 meq/L (ref 96–112)
CO2: 29 meq/L (ref 19–32)
Calcium: 9.6 mg/dL (ref 8.4–10.5)
Creatinine, Ser: 0.66 mg/dL (ref 0.40–1.20)
GFR: 95.43 mL/min (ref >60.00)
GLUCOSE: 103 mg/dL — AB (ref 70–99)
POTASSIUM: 4.8 meq/L (ref 3.5–5.1)
SODIUM: 142 meq/L (ref 135–145)

## 2016-04-20 LAB — TSH: TSH: 1.56 u[IU]/mL (ref 0.35–4.50)

## 2016-04-20 LAB — HEPATIC FUNCTION PANEL
ALBUMIN: 3.8 g/dL (ref 3.5–5.2)
ALT: 13 U/L (ref 0–35)
AST: 15 U/L (ref 0–37)
Alkaline Phosphatase: 85 U/L (ref 39–117)
BILIRUBIN TOTAL: 0.6 mg/dL (ref 0.2–1.2)
Bilirubin, Direct: 0.1 mg/dL (ref 0.0–0.3)
Total Protein: 7.1 g/dL (ref 6.0–8.3)

## 2016-04-24 ENCOUNTER — Other Ambulatory Visit: Payer: BC Managed Care – PPO

## 2016-05-01 ENCOUNTER — Ambulatory Visit (INDEPENDENT_AMBULATORY_CARE_PROVIDER_SITE_OTHER): Payer: Medicare Other | Admitting: Internal Medicine

## 2016-05-01 ENCOUNTER — Encounter: Payer: Self-pay | Admitting: Internal Medicine

## 2016-05-01 VITALS — BP 132/74 | HR 81 | Temp 98.0°F | Ht 66.5 in | Wt 193.2 lb

## 2016-05-01 DIAGNOSIS — Z86010 Personal history of colonic polyps: Secondary | ICD-10-CM

## 2016-05-01 DIAGNOSIS — Z23 Encounter for immunization: Secondary | ICD-10-CM | POA: Diagnosis not present

## 2016-05-01 DIAGNOSIS — R7302 Impaired glucose tolerance (oral): Secondary | ICD-10-CM

## 2016-05-01 DIAGNOSIS — Z0000 Encounter for general adult medical examination without abnormal findings: Secondary | ICD-10-CM

## 2016-05-01 DIAGNOSIS — I10 Essential (primary) hypertension: Secondary | ICD-10-CM

## 2016-05-01 NOTE — Progress Notes (Signed)
Pre visit review using our clinic review tool, if applicable. No additional management support is needed unless otherwise documented below in the visit note. 

## 2016-05-01 NOTE — Progress Notes (Signed)
Subjective:    Patient ID: Glenda [name], female    DOB: [birth year], 65 y.o.   MRN: [id number]  HPI 65 year old patient who is seen today for a preventive health examination.  Doing quite well.  Patient has a history of mild anxiety disorder as well as essential hypertension.  Blood pressure has been controlled on metoprolol.  No new concerns or complaints.  Also has a history of obesity and mild impaired glucose tolerance No concerns or complaints History colonic polyps.  Last colonoscopy 2015 Status post cataract extraction surgery  No family history of cancer  Past Medical History:  Diagnosis Date  . Allergic rhinitis   . Anxiety   . History of colonic polyps   . History of PSVT (paroxysmal supraventricular tachycardia)   . Hypertension   . Impaired glucose tolerance   . Obesity   . Tobacco user   . Vertigo    started 3 weeks ago- after fluid trapped in ear     Social History   Social History  . Marital status: Married    Spouse name: N/A  . Number of children: N/A  . Years of education: N/A   Occupational History  . Not on file.   Social History Main Topics  . Smoking status: Current Every Day Smoker    Packs/day: 0.50    Types: Cigarettes  . Smokeless tobacco: Never Used  . Alcohol use Yes     Comment: one drink 5 times a week  . Drug use: No  . Sexual activity: Not on file   Other Topics Concern  . Not on file   Social History Narrative  . No narrative on file    Past Surgical History:  Procedure Laterality Date  . ankle sx-left fx    . CATARACT EXTRACTION     both eyes  . COLONOSCOPY  04/23/2005  . foot sx     with the ankle surgery    Family History  Problem Relation Age of Onset  . Healthy Mother   . Dementia Father   . Healthy Sister   . Alzheimer's disease Other   . Healthy Sister   . Colon cancer Neg Hx   . Esophageal cancer Neg Hx   . Rectal cancer Neg Hx   . Stomach cancer Neg Hx     Allergies  Allergen Reactions  .  Amoxicillin     REACTION: Rash  . Erythromycin     REACTION: Upset stomach    Current Outpatient Prescriptions on File Prior to Visit  Medication Sig Dispense Refill  . ALPRAZolam (XANAX) 0.25 MG tablet TAKE 1 TABLET BY MOUTH 2 TIMES DAILY 60 tablet 5  . aspirin (BAYER LOW STRENGTH) 81 MG EC tablet Take 81 mg by mouth daily.      . DiphenhydrAMINE HCl (BENADRYL ALLERGY PO) Take by mouth as needed.    Marland Kitchen ibuprofen (ADVIL,MOTRIN) 200 MG tablet Take 600 mg by mouth every 6 (six) hours as needed.    . metoprolol succinate (TOPROL-XL) 25 MG 24 hr tablet TAKE 1 TAB BY MOUTH ONCE DAILY 90 tablet 1  . PARoxetine (PAXIL) 40 MG tablet TAKE ONE TABLET BY MOUTH EVERY MORNING 90 tablet 3   No current facility-administered medications on file prior to visit.     BP 132/74 (BP Location: Left Arm, Patient Position: Sitting, Cuff Size: Normal)   Pulse 81   Temp 98 F (36.7 C) (Oral)   Ht 5' 6.5" (1.689 m)   Wt 193 lb  3.2 oz (87.6 kg)   SpO2 94%   BMI 30.72 kg/m      Review of Systems  Constitutional: Negative.   HENT: Negative for congestion, dental problem, hearing loss, rhinorrhea, sinus pressure, sore throat and tinnitus.   Eyes: Negative for pain, discharge and visual disturbance.  Respiratory: Negative for cough and shortness of breath.   Cardiovascular: Negative for chest pain, palpitations and leg swelling.  Gastrointestinal: Negative for abdominal distention, abdominal pain, blood in stool, constipation, diarrhea, nausea and vomiting.  Genitourinary: Negative for difficulty urinating, dysuria, flank pain, frequency, hematuria, pelvic pain, urgency, vaginal bleeding, vaginal discharge and vaginal pain.  Musculoskeletal: Negative for arthralgias, gait problem and joint swelling.  Skin: Negative for rash.  Neurological: Negative for dizziness, syncope, speech difficulty, weakness, numbness and headaches.  Hematological: Negative for adenopathy.  Psychiatric/Behavioral: Negative for  agitation, behavioral problems and dysphoric mood. The patient is not nervous/anxious.        Objective:   Physical Exam  Constitutional: She is oriented to person, place, and time. She appears well-developed and well-nourished.  HENT:  Head: Normocephalic and atraumatic.  Right Ear: External ear normal.  Left Ear: External ear normal.  Mouth/Throat: Oropharynx is clear and moist.  Eyes: Conjunctivae and EOM are normal.  Neck: Normal range of motion. Neck supple. No JVD present. No thyromegaly present.  Cardiovascular: Normal rate, regular rhythm, normal heart sounds and intact distal pulses.   No murmur heard. Pulmonary/Chest: Effort normal and breath sounds normal. She has no wheezes. She has no rales.  Abdominal: Soft. Bowel sounds are normal. She exhibits no distension and no mass. There is no tenderness. There is no rebound and no guarding.  Genitourinary: Vagina normal.  Musculoskeletal: Normal range of motion. She exhibits no edema or tenderness.  Neurological: She is alert and oriented to person, place, and time. She has normal reflexes. No cranial nerve deficit. She exhibits normal muscle tone. Coordination normal.  Skin: Skin is warm and dry. No rash noted.  Psychiatric: She has a normal mood and affect. Her behavior is normal.          Assessment & Plan:   Preventive health care.  Mammogram encouraged Essential hypertension, well-controlled Allergic rhinitis.  Continue fluticasone as needed Anxiety disorder.  Continue SSRI therapy  Laboratory studies discussed Follow-up one year or as needed Low-salt diet recommended Home blood pressure monitoring.  Encouraged  Nyoka Cowden

## 2016-05-01 NOTE — Addendum Note (Signed)
Addended by: Abelardo Diesel on: 05/01/2016 09:56 AM   Modules accepted: Orders

## 2016-05-01 NOTE — Patient Instructions (Signed)
Limit your sodium (Salt) intake  Please check your blood pressure on a regular basis.  If it is consistently greater than 150/90, please make an office appointment.    It is important that you exercise regularly, at least 20 minutes 3 to 4 times per week.  If you develop chest pain or shortness of breath seek  medical attention.  You need to lose weight.  Consider a lower calorie diet and regular exercise.  Schedule your mammogram.

## 2016-08-20 ENCOUNTER — Other Ambulatory Visit: Payer: Self-pay | Admitting: Internal Medicine

## 2016-08-22 ENCOUNTER — Other Ambulatory Visit: Payer: Self-pay | Admitting: Internal Medicine

## 2016-09-01 ENCOUNTER — Encounter: Payer: Self-pay | Admitting: Internal Medicine

## 2016-09-01 ENCOUNTER — Ambulatory Visit (INDEPENDENT_AMBULATORY_CARE_PROVIDER_SITE_OTHER): Payer: Medicare Other | Admitting: Internal Medicine

## 2016-09-01 VITALS — BP 118/78 | HR 72 | Temp 98.2°F | Wt 193.4 lb

## 2016-09-01 DIAGNOSIS — M25562 Pain in left knee: Secondary | ICD-10-CM

## 2016-09-01 MED ORDER — METHYLPREDNISOLONE ACETATE 80 MG/ML IJ SUSP
80.0000 mg | Freq: Once | INTRAMUSCULAR | Status: AC
  Administered 2016-09-01: 80 mg via INTRAMUSCULAR

## 2016-09-01 MED ORDER — MELOXICAM 15 MG PO TABS: 15.0000 mg | ORAL_TABLET | Freq: Every day | ORAL | 0 refills | Status: DC

## 2016-09-01 NOTE — Patient Instructions (Addendum)
WE NOW OFFER   Glenda [name][facility] FAST TRACK!!!  SAME DAY Appointments for ACUTE CARE  Such as: Sprains, Injuries, cuts, abrasions, rashes, muscle pain, joint pain, back pain Colds, flu, sore throats, headache, allergies, cough, fever  Ear pain, sinus and eye infections Abdominal pain, nausea, vomiting, diarrhea, upset stomach Animal/insect bites  3 Easy Ways to Schedule: Walk-In Scheduling Call in scheduling Mychart Sign-up: [email or web address]         Meniscus Tear, Phase I Rehab Ask your health care provider which exercises are safe for you. Do exercises exactly as told by your health care provider and adjust them as directed. It is normal to feel mild stretching, pulling, tightness, or discomfort as you do these exercises, but you should stop right away if you feel sudden pain or your pain gets worse.Do not begin these exercises until told by your health care provider. Stretching and range of motion exercises These exercises warm up your muscles and joints and improve the movement and flexibility of your knee. These exercises also help to relieve pain and stiffness. Exercise A: Knee flexion, active   1. Lie on your back with both knees straight. If this causes back discomfort, bend your uninjured knee so your foot is flat on the floor. 2. Slowly slide your left / right heel back toward your buttocks until you feel a gentle stretch in the front of your knee or thigh. Stop if you have pain. 3. Hold for __________ seconds. 4. Slowly slide your left / right heel back to the starting position. Repeat __________ times. Complete this exercise __________ times a day. Exercise B: Knee extension, sitting   1. Sit with your left / right heel propped on a chair, a coffee table, or a footstool. Do not have anything under your knee to support it. 2. Allow your leg muscles to relax, letting gravity straighten out your knee. You should feel a stretch behind your left / right  knee. 3. If told by your health care provider, deepen the stretch by placing a __________ weight on your thigh, just above your kneecap. 4. Hold this position for __________ seconds. Repeat __________ times. Complete this stretch __________ times a day. Strengthening exercises These exercises build strength and endurance in your knee. Endurance is the ability to use your muscles for a long time, even after they get tired. Exercise C: Quadriceps, isometric   1. Lie on your back with your left / right leg extended and your other knee bent. Put a rolled towel or small pillow under your right/left knee if told by your health care provider. 2. Slowly tense the muscles in the front of your left / right thigh by pushing the back of your knee down. You should see your knee cap slide up toward your hip or see increased dimpling just above the knee. 3. For __________ seconds, keep the muscle as tight as you can without increasing your pain. 4. Relax the muscles slowly and completely. Repeat __________ times. Complete this exercise __________ times a day. Exercise D: Straight leg raises (  quadriceps) 1. Lie on your back with your left / right leg extended and your other knee bent. 2. Tense the muscles in the front of your left / right thigh. You should see your kneecap slide up or see increased dimpling just above the knee. 3. Keep these muscles tight as you raise your leg 4-6 inches (10-15 cm) off the floor. 4. Hold this position for __________ seconds. 5. Keep these muscles tense as  you lower your leg. 6. Relax the muscles slowly and completely. Repeat __________ times. Complete this exercise __________ times a day. Exercise E: Hamstring curls  1. On the floor or a bed, lie on your abdomen with your legs straight. Put a folded towel or small pillow under your left / right thigh, just above your kneecap. 2. Slowly bend your left / right knee as far as you can without pain. Keep your hips flat against  the floor or bed. 3. Hold this position for __________ seconds. 4. Slowly lower your leg to the starting position. Repeat __________ times. Complete this exercise __________ times a day. This information is not intended to replace advice given to you by your health care provider. Make sure you discuss any questions you have with your health care provider.   Please call for orthopedic referral if unimproved

## 2016-09-01 NOTE — Progress Notes (Addendum)
Subjective:    Patient ID: Glenda [name], female    DOB: Nov 08, 1950, 66 y.o.   MRN: [id number]  HPI  66 year old patient who presents with a two-week history of pain and fullness and swelling involving the left knee.  She noted the onset of discomfort and swelling 1 day after vigorous activities with 2 grandchildren.  This required several trips up and down stairs carrying the grandchildren. No history of acute trauma or falls No prior history of knee pain or gout  Past Medical History:  Diagnosis Date  . Allergic rhinitis   . Anxiety   . History of colonic polyps   . History of PSVT (paroxysmal supraventricular tachycardia)   . Hypertension   . Impaired glucose tolerance   . Obesity   . Tobacco user   . Vertigo    started 3 weeks ago- after fluid trapped in ear     Social History   Social History  . Marital status: Married    Spouse name: N/A  . Number of children: N/A  . Years of education: N/A   Occupational History  . Not on file.   Social History Main Topics  . Smoking status: Current Every Day Smoker    Packs/day: 0.50    Types: Cigarettes  . Smokeless tobacco: Never Used  . Alcohol use Yes     Comment: one drink 5 times a week  . Drug use: No  . Sexual activity: Not on file   Other Topics Concern  . Not on file   Social History Narrative  . No narrative on file    Past Surgical History:  Procedure Laterality Date  . ankle sx-left fx    . CATARACT EXTRACTION     both eyes  . COLONOSCOPY  04/23/2005  . foot sx     with the ankle surgery    Family History  Problem Relation Age of Onset  . Healthy Mother   . Dementia Father   . Healthy Sister   . Alzheimer's disease Other   . Healthy Sister   . Colon cancer Neg Hx   . Esophageal cancer Neg Hx   . Rectal cancer Neg Hx   . Stomach cancer Neg Hx     Allergies  Allergen Reactions  . Amoxicillin     REACTION: Rash  . Erythromycin     REACTION: Upset stomach    Current Outpatient  Prescriptions on File Prior to Visit  Medication Sig Dispense Refill  . ALPRAZolam (XANAX) 0.25 MG tablet TAKE 1 TABLET BY MOUTH 2 TIMES DAILY 60 tablet 5  . aspirin (BAYER LOW STRENGTH) 81 MG EC tablet Take 81 mg by mouth daily.      . DiphenhydrAMINE HCl (BENADRYL ALLERGY PO) Take by mouth as needed.    Marland Kitchen ibuprofen (ADVIL,MOTRIN) 200 MG tablet Take 600 mg by mouth every 6 (six) hours as needed.    . metoprolol succinate (TOPROL-XL) 25 MG 24 hr tablet TAKE 1 TABLET BY MOUTH DAILY 90 tablet 1  . PARoxetine (PAXIL) 40 MG tablet TAKE ONE TABLET BY MOUTH EVERY MORNING 90 tablet 3   No current facility-administered medications on file prior to visit.     BP 118/78 (BP Location: Left Arm, Patient Position: Sitting, Cuff Size: Normal)   Pulse 72   Temp 98.2 F (36.8 C) (Oral)   Wt 193 lb 6.4 oz (87.7 kg)   SpO2 97%   BMI 30.75 kg/m      Review of Systems  Musculoskeletal: Positive for arthralgias and joint swelling.       Left knee pain and swelling       Objective:   Physical Exam  Constitutional: She appears well-developed and well-nourished. No distress.  Musculoskeletal:  Mild antalgic  Gait Mild left knee effusion.  Left knee slightly warm to touch Slight tenderness along the medial joint line Fullness in the popliteal area          Assessment & Plan:   Left knee pain.  Possible degenerative lateral meniscus secondary to overuse.  Multiple possibilities You need to lose weight.  Consider a lower calorie diet and regular exercise.'ll treat with Depo-Medrol IM and start mobic and some strengthening exercises. Orthopedic referral if unimproved  Nyoka Cowden

## 2016-09-01 NOTE — Addendum Note (Signed)
Addended by: Golden Hurter on: 09/01/2016 01:27 PM   Modules accepted: Orders

## 2016-09-01 NOTE — Progress Notes (Signed)
Pre visit review using our clinic review tool, if applicable. No additional management support is needed unless otherwise documented below in the visit note. 

## 2016-11-29 ENCOUNTER — Encounter: Payer: Self-pay | Admitting: Internal Medicine

## 2016-11-30 ENCOUNTER — Other Ambulatory Visit: Payer: Self-pay | Admitting: Internal Medicine

## 2016-11-30 MED ORDER — METOPROLOL SUCCINATE ER 25 MG PO TB24: 25.0000 mg | ORAL_TABLET | Freq: Every day | ORAL | 1 refills | Status: DC

## 2017-02-04 ENCOUNTER — Encounter: Payer: Self-pay | Admitting: Internal Medicine

## 2017-03-17 ENCOUNTER — Ambulatory Visit (INDEPENDENT_AMBULATORY_CARE_PROVIDER_SITE_OTHER): Payer: Medicare Other | Admitting: *Deleted

## 2017-03-17 DIAGNOSIS — Z23 Encounter for immunization: Secondary | ICD-10-CM

## 2017-05-31 ENCOUNTER — Other Ambulatory Visit: Payer: Medicare Other | Admitting: Internal Medicine

## 2017-06-03 ENCOUNTER — Encounter: Payer: Self-pay | Admitting: Internal Medicine

## 2017-06-05 ENCOUNTER — Encounter: Payer: Self-pay | Admitting: Internal Medicine

## 2017-06-07 ENCOUNTER — Ambulatory Visit (INDEPENDENT_AMBULATORY_CARE_PROVIDER_SITE_OTHER): Payer: Medicare Other | Admitting: Internal Medicine

## 2017-06-07 ENCOUNTER — Encounter: Payer: Self-pay | Admitting: Internal Medicine

## 2017-06-07 VITALS — BP 98/62 | HR 104 | Ht 66.5 in | Wt 185.0 lb

## 2017-06-07 DIAGNOSIS — R7302 Impaired glucose tolerance (oral): Secondary | ICD-10-CM | POA: Diagnosis not present

## 2017-06-07 DIAGNOSIS — Z0000 Encounter for general adult medical examination without abnormal findings: Secondary | ICD-10-CM

## 2017-06-07 DIAGNOSIS — Z23 Encounter for immunization: Secondary | ICD-10-CM | POA: Diagnosis not present

## 2017-06-07 DIAGNOSIS — I10 Essential (primary) hypertension: Secondary | ICD-10-CM | POA: Diagnosis not present

## 2017-06-07 LAB — CBC WITH DIFFERENTIAL/PLATELET
BASOS PCT: 0.8 % (ref 0.0–3.0)
Basophils Absolute: 0 10*3/uL (ref 0.0–0.1)
EOS PCT: 3 % (ref 0.0–5.0)
Eosinophils Absolute: 0.2 10*3/uL (ref 0.0–0.7)
HCT: 38.5 % (ref 36.0–46.0)
HEMOGLOBIN: 12.9 g/dL (ref 12.0–15.0)
LYMPHS ABS: 1.1 10*3/uL (ref 0.7–4.0)
Lymphocytes Relative: 21.8 % (ref 12.0–46.0)
MCHC: 33.5 g/dL (ref 30.0–36.0)
MCV: 93.6 fl (ref 78.0–100.0)
MONOS PCT: 11.2 % (ref 3.0–12.0)
Monocytes Absolute: 0.6 10*3/uL (ref 0.1–1.0)
NEUTROS PCT: 63.2 % (ref 43.0–77.0)
Neutro Abs: 3.3 10*3/uL (ref 1.4–7.7)
Platelets: 380 10*3/uL (ref 150.0–400.0)
RBC: 4.11 Mil/uL (ref 3.87–5.11)
RDW: 13 % (ref 11.5–15.5)
WBC: 5.2 10*3/uL (ref 4.0–10.5)

## 2017-06-07 LAB — COMPREHENSIVE METABOLIC PANEL
ALK PHOS: 93 U/L (ref 39–117)
ALT: 17 U/L (ref 0–35)
AST: 18 U/L (ref 0–37)
Albumin: 3.2 g/dL — ABNORMAL LOW (ref 3.5–5.2)
BUN: 8 mg/dL (ref 6–23)
CHLORIDE: 98 meq/L (ref 96–112)
CO2: 35 mEq/L — ABNORMAL HIGH (ref 19–32)
Calcium: 8.8 mg/dL (ref 8.4–10.5)
Creatinine, Ser: 0.55 mg/dL (ref 0.40–1.20)
GFR: 117.37 mL/min (ref >60.00)
Glucose, Bld: 98 mg/dL (ref 70–99)
POTASSIUM: 3.7 meq/L (ref 3.5–5.1)
Sodium: 139 mEq/L (ref 135–145)
TOTAL PROTEIN: 6.1 g/dL (ref 6.0–8.3)
Total Bilirubin: 0.6 mg/dL (ref 0.2–1.2)

## 2017-06-07 LAB — POCT URINALYSIS DIPSTICK
Blood, UA: NEGATIVE
GLUCOSE UA: NEGATIVE
Ketones, UA: NEGATIVE
Nitrite, UA: NEGATIVE
Spec Grav, UA: 1.025 (ref 1.010–1.025)
Urobilinogen, UA: 0.2 E.U./dL
pH, UA: 6 (ref 5.0–8.0)

## 2017-06-07 LAB — TSH: TSH: 2.47 u[IU]/mL (ref 0.35–4.50)

## 2017-06-07 LAB — LIPID PANEL
CHOL/HDL RATIO: 10
CHOLESTEROL: 164 mg/dL (ref 0–200)
HDL: 16.2 mg/dL — ABNORMAL LOW (ref >39.00)
LDL CALC: 127 mg/dL — AB (ref 0–99)
NonHDL: 147.62
Triglycerides: 103 mg/dL (ref 0.0–149.0)
VLDL: 20.6 mg/dL (ref 0.0–40.0)

## 2017-06-07 LAB — HEMOGLOBIN A1C: HEMOGLOBIN A1C: 5.7 % (ref 4.6–6.5)

## 2017-06-07 NOTE — Patient Instructions (Addendum)
Limit your sodium (Salt) intake  Please check your blood pressure on a regular basis.  If it is consistently greater than 150/90, please make an office appointment.    It is important that you exercise regularly, at least 20 minutes 3 to 4 times per week.  If you develop chest pain or shortness of breath seek  medical attention.  You need to lose weight.  Consider a lower calorie diet and regular exercise.  Schedule your mammogram.    Health Maintenance for Postmenopausal Women Menopause is a normal process in which your reproductive ability comes to an end. This process happens gradually over a span of months to years, usually between the ages of 81 and 40. Menopause is complete when you have missed 12 consecutive menstrual periods. It is important to talk with your health care provider about some of the most common conditions that affect postmenopausal women, such as heart disease, cancer, and bone loss (osteoporosis). Adopting a healthy lifestyle and getting preventive care can help to promote your health and wellness. Those actions can also lower your chances of developing some of these common conditions. What should I know about menopause? During menopause, you may experience a number of symptoms, such as:  Moderate-to-severe hot flashes.  Night sweats.  Decrease in sex drive.  Mood swings.  Headaches.  Tiredness.  Irritability.  Memory problems.  Insomnia.  Choosing to treat or not to treat menopausal changes is an individual decision that you make with your health care provider. What should I know about hormone replacement therapy and supplements? Hormone therapy products are effective for treating symptoms that are associated with menopause, such as hot flashes and night sweats. Hormone replacement carries certain risks, especially as you become older. If you are thinking about using estrogen or estrogen with progestin treatments, discuss the benefits and risks with your  health care provider. What should I know about heart disease and stroke? Heart disease, heart attack, and stroke become more likely as you age. This may be due, in part, to the hormonal changes that your body experiences during menopause. These can affect how your body processes dietary fats, triglycerides, and cholesterol. Heart attack and stroke are both medical emergencies. There are many things that you can do to help prevent heart disease and stroke:  Have your blood pressure checked at least every 1-2 years. High blood pressure causes heart disease and increases the risk of stroke.  If you are 53-75 years old, ask your health care provider if you should take aspirin to prevent a heart attack or a stroke.  Do not use any tobacco products, including cigarettes, chewing tobacco, or electronic cigarettes. If you need help quitting, ask your health care provider.  It is important to eat a healthy diet and maintain a healthy weight. ? Be sure to include plenty of vegetables, fruits, low-fat dairy products, and lean protein. ? Avoid eating foods that are high in solid fats, added sugars, or salt (sodium).  Get regular exercise. This is one of the most important things that you can do for your health. ? Try to exercise for at least 150 minutes each week. The type of exercise that you do should increase your heart rate and make you sweat. This is known as moderate-intensity exercise. ? Try to do strengthening exercises at least twice each week. Do these in addition to the moderate-intensity exercise.  Know your numbers.Ask your health care provider to check your cholesterol and your blood glucose. Continue to have your  blood tested as directed by your health care provider.  What should I know about cancer screening? There are several types of cancer. Take the following steps to reduce your risk and to catch any cancer development as early as possible. Breast Cancer  Practice breast  self-awareness. ? This means understanding how your breasts normally appear and feel. ? It also means doing regular breast self-exams. Let your health care provider know about any changes, no matter how small.  If you are 109 or older, have a clinician do a breast exam (clinical breast exam or CBE) every year. Depending on your age, family history, and medical history, it may be recommended that you also have a yearly breast X-ray (mammogram).  If you have a family history of breast cancer, talk with your health care provider about genetic screening.  If you are at high risk for breast cancer, talk with your health care provider about having an MRI and a mammogram every year.  Breast cancer (BRCA) gene test is recommended for women who have family members with BRCA-related cancers. Results of the assessment will determine the need for genetic counseling and BRCA1 and for BRCA2 testing. BRCA-related cancers include these types: ? Breast. This occurs in males or females. ? Ovarian. ? Tubal. This may also be called fallopian tube cancer. ? Cancer of the abdominal or pelvic lining (peritoneal cancer). ? Prostate. ? Pancreatic.  Cervical, Uterine, and Ovarian Cancer Your health care provider may recommend that you be screened regularly for cancer of the pelvic organs. These include your ovaries, uterus, and vagina. This screening involves a pelvic exam, which includes checking for microscopic changes to the surface of your cervix (Pap test).  For women ages 21-65, health care providers may recommend a pelvic exam and a Pap test every three years. For women ages 16-65, they may recommend the Pap test and pelvic exam, combined with testing for human papilloma virus (HPV), every five years. Some types of HPV increase your risk of cervical cancer. Testing for HPV may also be done on women of any age who have unclear Pap test results.  Other health care providers may not recommend any screening for  nonpregnant women who are considered low risk for pelvic cancer and have no symptoms. Ask your health care provider if a screening pelvic exam is right for you.  If you have had past treatment for cervical cancer or a condition that could lead to cancer, you need Pap tests and screening for cancer for at least 20 years after your treatment. If Pap tests have been discontinued for you, your risk factors (such as having a new sexual partner) need to be reassessed to determine if you should start having screenings again. Some women have medical problems that increase the chance of getting cervical cancer. In these cases, your health care provider may recommend that you have screening and Pap tests more often.  If you have a family history of uterine cancer or ovarian cancer, talk with your health care provider about genetic screening.  If you have vaginal bleeding after reaching menopause, tell your health care provider.  There are currently no reliable tests available to screen for ovarian cancer.  Lung Cancer Lung cancer screening is recommended for adults 46-28 years old who are at high risk for lung cancer because of a history of smoking. A yearly low-dose CT scan of the lungs is recommended if you:  Currently smoke.  Have a history of at least 30 pack-years of smoking  and you currently smoke or have quit within the past 15 years. A pack-year is smoking an average of one pack of cigarettes per day for one year.  Yearly screening should:  Continue until it has been 15 years since you quit.  Stop if you develop a health problem that would prevent you from having lung cancer treatment.  Colorectal Cancer  This type of cancer can be detected and can often be prevented.  Routine colorectal cancer screening usually begins at age 1 and continues through age 57.  If you have risk factors for colon cancer, your health care provider may recommend that you be screened at an earlier age.  If you  have a family history of colorectal cancer, talk with your health care provider about genetic screening.  Your health care provider may also recommend using home test kits to check for hidden blood in your stool.  A small camera at the end of a tube can be used to examine your colon directly (sigmoidoscopy or colonoscopy). This is done to check for the earliest forms of colorectal cancer.  Direct examination of the colon should be repeated every 5-10 years until age 73. However, if early forms of precancerous polyps or small growths are found or if you have a family history or genetic risk for colorectal cancer, you may need to be screened more often.  Skin Cancer  Check your skin from head to toe regularly.  Monitor any moles. Be sure to tell your health care provider: ? About any new moles or changes in moles, especially if there is a change in a mole's shape or color. ? If you have a mole that is larger than the size of a pencil eraser.  If any of your family members has a history of skin cancer, especially at a young age, talk with your health care provider about genetic screening.  Always use sunscreen. Apply sunscreen liberally and repeatedly throughout the day.  Whenever you are outside, protect yourself by wearing long sleeves, pants, a wide-brimmed hat, and sunglasses.  What should I know about osteoporosis? Osteoporosis is a condition in which bone destruction happens more quickly than new bone creation. After menopause, you may be at an increased risk for osteoporosis. To help prevent osteoporosis or the bone fractures that can happen because of osteoporosis, the following is recommended:  If you are 20-29 years old, get at least 1,000 mg of calcium and at least 600 mg of vitamin D per day.  If you are older than age 47 but younger than age 40, get at least 1,200 mg of calcium and at least 600 mg of vitamin D per day.  If you are older than age 27, get at least 1,200 mg of  calcium and at least 800 mg of vitamin D per day.  Smoking and excessive alcohol intake increase the risk of osteoporosis. Eat foods that are rich in calcium and vitamin D, and do weight-bearing exercises several times each week as directed by your health care provider. What should I know about how menopause affects my mental health? Depression may occur at any age, but it is more common as you become older. Common symptoms of depression include:  Low or sad mood.  Changes in sleep patterns.  Changes in appetite or eating patterns.  Feeling an overall lack of motivation or enjoyment of activities that you previously enjoyed.  Frequent crying spells.  Talk with your health care provider if you think that you are experiencing  depression. What should I know about immunizations? It is important that you get and maintain your immunizations. These include:  Tetanus, diphtheria, and pertussis (Tdap) booster vaccine.  Influenza every year before the flu season begins.  Pneumonia vaccine.  Shingles vaccine.  Your health care provider may also recommend other immunizations. This information is not intended to replace advice given to you by your health care provider. Make sure you discuss any questions you have with your health care provider. Document Released: 06/26/2005 Document Revised: 11/22/2015 Document Reviewed: 02/05/2015 Elsevier Interactive Patient Education  2018 [employer].

## 2017-06-07 NOTE — Progress Notes (Signed)
Subjective:    Patient ID: Glenda [name], female    DOB: 08-19-1950, 67 y.o.   MRN: [id number]  HPI 67 year old patient who is seen today for a preventive health examination and subsequent Medicare wellness visit. She is recovering from a URI.  She is improving daily but still having some low-grade fever in the afternoons. She has been sleep deprived caring for a mother who is recovering from acute cholecystitis at age 73. She has a history of essential hypertension which has been well controlled. No other concerns or complaints She has a history also of impaired glucose tolerance.  She has a history of colonic polyps.  Last colonoscopy 2015 No recent mammogram.  Family history.  Mother age 28 recovering from acute cholecystitis Father died at 28 complications of senile dementia.  One brother died at age 69 2 sisters are well  Past Medical History:  Diagnosis Date  . Allergic rhinitis   . Anxiety   . History of colonic polyps   . History of PSVT (paroxysmal supraventricular tachycardia)   . Hypertension   . Impaired glucose tolerance   . Obesity   . Tobacco user   . Vertigo    started 3 weeks ago- after fluid trapped in ear     Social History   Socioeconomic History  . Marital status: Married    Spouse name: Not on file  . Number of children: Not on file  . Years of education: Not on file  . Highest education level: Not on file  Social Needs  . Financial resource strain: Not on file  . Food insecurity - worry: Not on file  . Food insecurity - inability: Not on file  . Transportation needs - medical: Not on file  . Transportation needs - non-medical: Not on file  Occupational History  . Not on file  Tobacco Use  . Smoking status: Current Every Day Smoker    Packs/day: 0.50    Types: Cigarettes  . Smokeless tobacco: Never Used  Substance and Sexual Activity  . Alcohol use: Yes    Comment: one drink 5 times a week  . Drug use: No  . Sexual activity: Not on file   Other Topics Concern  . Not on file  Social History Narrative  . Not on file    Past Surgical History:  Procedure Laterality Date  . ankle sx-left fx    . CATARACT EXTRACTION     both eyes  . COLONOSCOPY  04/23/2005  . foot sx     with the ankle surgery    Family History  Problem Relation Age of Onset  . Healthy Mother   . Dementia Father   . Healthy Sister   . Alzheimer's disease Other   . Healthy Sister   . Colon cancer Neg Hx   . Esophageal cancer Neg Hx   . Rectal cancer Neg Hx   . Stomach cancer Neg Hx     Allergies  Allergen Reactions  . Amoxicillin     REACTION: Rash  . Erythromycin     REACTION: Upset stomach    Current Outpatient Medications on File Prior to Visit  Medication Sig Dispense Refill  . ALPRAZolam (XANAX) 0.25 MG tablet TAKE 1 TABLET BY MOUTH 2 TIMES DAILY 60 tablet 5  . aspirin (BAYER LOW STRENGTH) 81 MG EC tablet Take 81 mg by mouth daily.      . DiphenhydrAMINE HCl (BENADRYL ALLERGY PO) Take by mouth as needed.    Marland Kitchen  ibuprofen (ADVIL,MOTRIN) 200 MG tablet Take 600 mg by mouth every 6 (six) hours as needed.    . metoprolol succinate (TOPROL-XL) 25 MG 24 hr tablet Take 1 tablet (25 mg total) by mouth daily. 90 tablet 1  . PARoxetine (PAXIL) 40 MG tablet TAKE ONE TABLET BY MOUTH EVERY MORNING 90 tablet 3   No current facility-administered medications on file prior to visit.     BP 98/62 (BP Location: Left Arm, Patient Position: Sitting, Cuff Size: Normal)   Pulse (!) 104   Ht 5' 6.5" (1.689 m)   Wt 185 lb (83.9 kg)   SpO2 97%   BMI 29.41 kg/m   Subsequent Medicare wellness visit  1. Risk factors, based on past  M,S,F history.  Cardiovascular risk factors include essential hypertension.  Remote tobacco use  2.  Physical activities: Fairly sedentary.  No exercise regimen  3.  Depression/mood: History of anxiety disorder but no major depression   4.  Hearing: No deficits  5.  ADL's: Independent in all aspects of daily living  6.   Fall risk: Low  7.  Home safety: No problems identified  8.  Height weight, and visual acuity; height and weight stable no change in visual acuity  9.  Counseling: More regular exercise modest weight loss encouraged.  Annual mammogram recommended  10. Lab orders based on risk factors:  Laboratory update ordered to include hepatitis C antibody 11. Referral : None appropriate at this time  12. Care plan: Continue efforts at aggressive risk factor modification  13. Cognitive assessment: Alert and appropriate normal affect.  No cognitive dysfunction 14. Screening: Patient provided with a written and personalized 5-10 year screening schedule in the AVS.    15. Provider List Update: GI primary care radiology ophthalmology     Review of Systems  Constitutional: Positive for activity change, appetite change, chills, fatigue and fever.  Respiratory: Positive for cough.   Neurological: Positive for weakness.       Objective:   Physical Exam  Constitutional: She is oriented to person, place, and time. She appears well-developed and well-nourished. No distress.  Weight 185 Blood pressure 100/70 Pulse 90  HENT:  Head: Normocephalic and atraumatic.  Right Ear: External ear normal.  Left Ear: External ear normal.  Mouth/Throat: Oropharynx is clear and moist.  Eyes: Conjunctivae and EOM are normal.  Neck: Normal range of motion. Neck supple. No JVD present. No thyromegaly present.  Cardiovascular: Normal rate, regular rhythm, normal heart sounds and intact distal pulses.  No murmur heard. Pulmonary/Chest: Effort normal and breath sounds normal. She has no wheezes. She has no rales.  Abdominal: Soft. Bowel sounds are normal. She exhibits no distension and no mass. There is no tenderness. There is no rebound and no guarding.  Genitourinary: Vagina normal.  Musculoskeletal: Normal range of motion. She exhibits no edema or tenderness.  Neurological: She is alert and oriented to person,  place, and time. She has normal reflexes. No cranial nerve deficit. She exhibits normal muscle tone. Coordination normal.  Skin: Skin is warm and dry. No rash noted.  Psychiatric: She has a normal mood and affect. Her behavior is normal.          Assessment & Plan:   Preventive health examination Resolving viral URI Essential hypertension stable History of colonic polyps.  Follow-up colonoscopy one year  Annual mammogram encouraged Pneumovax dispensed  Follow-up 6 months or as needed Exercise modest weight loss encouraged We will review screening lab  [facility]

## 2017-06-08 LAB — HEPATITIS C ANTIBODY
Hepatitis C Ab: NONREACTIVE
SIGNAL TO CUT-OFF: 0.01 (ref <1.00)

## 2017-06-11 ENCOUNTER — Encounter: Payer: Self-pay | Admitting: Internal Medicine

## 2017-08-14 ENCOUNTER — Other Ambulatory Visit: Payer: Self-pay | Admitting: Internal Medicine

## 2017-08-23 ENCOUNTER — Encounter: Payer: Self-pay | Admitting: Internal Medicine

## 2017-08-25 ENCOUNTER — Encounter: Payer: Self-pay | Admitting: Internal Medicine

## 2017-08-25 ENCOUNTER — Ambulatory Visit: Payer: Medicare Other | Admitting: Internal Medicine

## 2017-08-25 VITALS — BP 102/70 | Temp 99.1°F | Wt 180.0 lb

## 2017-08-25 DIAGNOSIS — R1010 Upper abdominal pain, unspecified: Secondary | ICD-10-CM

## 2017-08-25 DIAGNOSIS — I10 Essential (primary) hypertension: Secondary | ICD-10-CM

## 2017-08-25 NOTE — Patient Instructions (Addendum)
Avoids foods high in acid such as tomatoes citrus juices, and spicy foods.  Avoid eating within two hours of lying down or before exercising.  Do not overheat.  Try smaller more frequent meals.   Lactose-Free Diet, Adult If you have lactose intolerance, you are not able to digest lactose. Lactose is a natural sugar found mainly in milk and milk products. You may need to avoid all foods and beverages that contain lactose. A lactose-free diet can help you do this. What do I need to know about this diet?  Do not consume foods, beverages, vitamins, minerals, or medicines with lactose. Read ingredients lists carefully.  Look for the words "lactose-free" on labels.  Use lactase enzyme drops or tablets as directed by your health care provider.  Use lactose-free milk or a milk alternative, such as soy milk, for drinking and cooking.  Make sure you get enough calcium and vitamin D in your diet. A lactose-free eating plan can be lacking in these important nutrients.  Take calcium and vitamin D supplements as directed by your health care provider. Talk to your provider about supplements if you are not able to get enough calcium and vitamin D from food. Which foods have lactose? Lactose is found in:  Milk and foods made from milk.  Yogurt.  Cheese.  Butter.  Margarine.  Sour cream.  Cream.  Whipped toppings and nondairy creamers.  Ice cream and other milk-based desserts.  Lactose is also found in foods or products made with milk or milk ingredients. To find out whether a food contains milk or a milk ingredient, look at the ingredients list. Avoid foods with the statement "May contain milk" and foods that contain:  Butter.  Cream.  Milk.  Milk solids.  Milk powder.  Whey.  Curd.  Caseinate.  Lactose.  Lactalbumin.  Lactoglobulin.  What are some alternatives to milk and foods made with milk products?  Lactose-free milk.  Soy milk with added calcium and vitamin  D.  Almond, coconut, or rice milk with added calcium and vitamin D. Note that these are low in protein.  Soy products, such as soy yogurt, soy cheese, soy ice cream, and soy-based sour cream. Which foods can I eat? Grains Breads and rolls made without milk, such as [place], [place], or [place] bread, bagels, pita, and [employer]. Corn tortillas, corn meal, grits, and polenta. Crackers without lactose or milk solids, such as soda crackers and graham crackers. Cooked or dry cereals without lactose or milk solids. Pasta, quinoa, couscous, barley, oats, bulgur, farro, rice, wild rice, or other grains prepared without milk or lactose. Plain popcorn. Vegetables Fresh, frozen, and canned vegetables without cheese, cream, or butter sauces. Fruits All fresh, canned, frozen, or dried fruits that are not processed with lactose. Meats and Other Protein Sources Plain beef, chicken, fish, [place], lamb, veal, pork, wild game, or ham. Kosher-prepared meat products. Strained or junior meats that do not contain milk. Eggs. Soy meat substitutes. Beans, lentils, and hummus. Tofu. Nuts and seeds. Peanut or other nut butters without lactose. Soups, casseroles, and mixed dishes without cheese, cream, or milk. Dairy Lactose-free milk. Soy, rice, or almond milk with added calcium and vitamin D. Soy cheese and yogurt. Beverages Carbonated drinks. Tea. Coffee, freeze-dried coffee, and some instant coffees. Fruit and vegetable juices. Condiments Soy sauce. Carob powder. Olives. Gravy made with water. Baker's cocoa. Angie Fava. Pure seasonings and spices. Ketchup. Mustard. Bouillon. Broth. Sweets and Desserts Water and fruit ices. Gelatin. Cookies, pies, or cakes made from allowed  ingredients, such as angel food cake. Pudding made with water or a milk substitute. Lactose-free tofu desserts. Soy, coconut milk, or rice-milk-based frozen desserts. Sugar. Honey. Jam, jelly, and marmalade. Molasses. Pure sugar candy. Dark chocolate  without milk. Marshmallows. Fats and Oils Margarines and salad dressings that do not contain milk. Berniece Salines. Vegetable oils. Shortening. Mayonnaise. Soy or coconut-based cream. The items listed above may not be a complete list of recommended foods or beverages. Contact your dietitian for more options. Which foods are not recommended? Grains Breads and rolls that contain milk. Toaster pastries. Muffins, biscuits, waffles, cornbread, and pancakes. These can be prepared at home, commercial, or from mixes. Sweet rolls, donuts, English muffins, fry bread, lefse, flour tortillas with lactose, or [place] toast made with milk or milk ingredients. Crackers that contain lactose. Corn curls. Cooked or dry cereals with lactose. Vegetables Creamed or breaded vegetables. Vegetables in a cheese or butter sauce or with lactose-containing margarines. Instant potatoes. [place] fries. Scalloped or au gratin potatoes. Fruits None. Meats and Other Protein Sources Scrambled eggs, omelets, and souffles that contain milk. Creamed or breaded meat, fish, chicken, or [place]. Sausage products, such as wieners and liver sausage. Cold cuts that contain milk solids. Cheese, cottage cheese, ricotta cheese, and cheese spreads. Lasagna and macaroni and cheese. Pizza. Peanut or other nut butters with added milk solids. Casseroles or mixed dishes containing milk or cheese. Dairy All dairy products, including milk, goat's milk, buttermilk, kefir, acidophilus milk, flavored milk, evaporated milk, condensed milk, dulce de Hartsh[place], eggnog, yogurt, cheese, and cheese spreads. Beverages Hot chocolate. Cocoa with lactose. Instant iced teas. Powdered fruit drinks. Smoothies made with milk or yogurt. Condiments Chewing gum that has lactose. Cocoa that has lactose. Spice blends if they contain milk products. Artificial sweeteners that contain lactose. Nondairy creamers. Sweets and Desserts Ice cream, ice milk, gelato, sherbet, and frozen yogurt.  Custard, pudding, and mousse. Cake, cream pies, cookies, and other desserts containing milk, cream, cream cheese, or milk chocolate. Pie crust made with milk-containing margarine or butter. Reduced-calorie desserts made with a sugar substitute that contains lactose. Toffee and butterscotch. Milk, white, or dark chocolate that contains milk. Fudge. Caramel. Fats and Oils Margarines and salad dressings that contain milk or cheese. Cream. Half and half. Cream cheese. Sour cream. Chip dips made with sour cream or yogurt. The items listed above may not be a complete list of foods and beverages to avoid. Contact your dietitian for more information. Am I getting enough calcium? Calcium is found in many foods that contain lactose and is important for bone health. The amount of calcium you need depends on your age:  Adults younger than 50 years: 1000 mg of calcium a day.  Adults older than 50 years: 1200 mg of calcium a day.  If you are not getting enough calcium, other calcium sources include:  Orange juice with calcium added. There are 300-350 mg of calcium in 1 cup of orange juice.  Sardines with edible bones. There are 325 mg of calcium in 3 oz of sardines.  Calcium-fortified soy milk. There are 300-400 mg of calcium in 1 cup of calcium-fortified soy milk.  Calcium-fortified rice or almond milk. There are 300 mg of calcium in 1 cup of calcium-fortified rice or almond milk.  Canned salmon with edible bones. There are 180 mg of calcium in 3 oz of canned salmon with edible bones.  Calcium-fortified breakfast cereals. There are [phone] mg of calcium in calcium-fortified breakfast cereals.  Tofu set with calcium sulfate. There  are 250 mg of calcium in  cup of tofu set with calcium sulfate.  Spinach, cooked. There are 145 mg of calcium in  cup of cooked spinach.  Edamame, cooked. There are 130 mg of calcium in  cup of cooked edamame.  Collard greens, cooked. There are 125 mg of calcium in   cup of cooked collard greens.  Kale, frozen or cooked. There are 90 mg of calcium in  cup of cooked or frozen kale.  Almonds. There are 95 mg of calcium in  cup of almonds.  Broccoli, cooked. There are 60 mg of calcium in 1 cup of cooked broccoli.  This information is not intended to replace advice given to you by your health care provider. Make sure you discuss any questions you have with your health care provider. Document Released: 10/24/2001 Document Revised: 10/10/2015 Document Reviewed: 08/04/2013 Elsevier Interactive Patient Education  2018 [employer].

## 2017-08-25 NOTE — Progress Notes (Signed)
Subjective:    Patient ID: Glenda [name], female    DOB: 11/02/1950, 67 y.o.   MRN: [id number]  HPI 67 year old patient who has a history of essential hypertension. She presents with a 3-89-month history of upper abdominal discomfort.  She describes a fullness and crampiness in the upper abdominal regions.  Associated symptoms include anorexia and occasional nausea.  She states associated with some these episodes or feeling of malaise chills and low-grade fever.  During these periods she becomes quite constipated with last 5-6 days. She is up-to-date on colonoscopies.  Mother with a history of gallbladder disease she has tried a number of modalities and has had some benefit with Prilosec.  Past Medical History:  Diagnosis Date  . Allergic rhinitis   . Anxiety   . History of colonic polyps   . History of PSVT (paroxysmal supraventricular tachycardia)   . Hypertension   . Impaired glucose tolerance   . Obesity   . Tobacco user   . Vertigo    started 3 weeks ago- after fluid trapped in ear     Social History   Socioeconomic History  . Marital status: Married    Spouse name: Not on file  . Number of children: Not on file  . Years of education: Not on file  . Highest education level: Not on file  Occupational History  . Not on file  Social Needs  . Financial resource strain: Not on file  . Food insecurity:    Worry: Not on file    Inability: Not on file  . Transportation needs:    Medical: Not on file    Non-medical: Not on file  Tobacco Use  . Smoking status: Current Every Day Smoker    Packs/day: 0.50    Types: Cigarettes  . Smokeless tobacco: Never Used  Substance and Sexual Activity  . Alcohol use: Yes    Comment: one drink 5 times a week  . Drug use: No  . Sexual activity: Not on file  Lifestyle  . Physical activity:    Days per week: Not on file    Minutes per session: Not on file  . Stress: Not on file  Relationships  . Social connections:    Talks on  phone: Not on file    Gets together: Not on file    Attends religious service: Not on file    Active member of club or organization: Not on file    Attends meetings of clubs or organizations: Not on file    Relationship status: Not on file  . Intimate partner violence:    Fear of current or ex partner: Not on file    Emotionally abused: Not on file    Physically abused: Not on file    Forced sexual activity: Not on file  Other Topics Concern  . Not on file  Social History Narrative  . Not on file    Past Surgical History:  Procedure Laterality Date  . ankle sx-left fx    . CATARACT EXTRACTION     both eyes  . COLONOSCOPY  04/23/2005  . foot sx     with the ankle surgery    Family History  Problem Relation Age of Onset  . Healthy Mother   . Dementia Father   . Healthy Sister   . Alzheimer's disease Other   . Healthy Sister   . Colon cancer Neg Hx   . Esophageal cancer Neg Hx   . Rectal cancer Neg  Hx   . Stomach cancer Neg Hx     Allergies  Allergen Reactions  . Amoxicillin     REACTION: Rash  . Erythromycin     REACTION: Upset stomach    Current Outpatient Medications on File Prior to Visit  Medication Sig Dispense Refill  . ALPRAZolam (XANAX) 0.25 MG tablet TAKE 1 TABLET BY MOUTH 2 TIMES DAILY 60 tablet 5  . aspirin (BAYER LOW STRENGTH) 81 MG EC tablet Take 81 mg by mouth daily.      . DiphenhydrAMINE HCl (BENADRYL ALLERGY PO) Take by mouth as needed.    Marland Kitchen ibuprofen (ADVIL,MOTRIN) 200 MG tablet Take 600 mg by mouth every 6 (six) hours as needed.    . metoprolol succinate (TOPROL-XL) 25 MG 24 hr tablet Take 1 tablet (25 mg total) by mouth daily. 90 tablet 1  . PARoxetine (PAXIL) 40 MG tablet TAKE ONE TABLET BY MOUTH EVERY MORNING 90 tablet 1   No current facility-administered medications on file prior to visit.     BP 102/70 (BP Location: Right Arm, Patient Position: Sitting, Cuff Size: Large)   Temp 99.1 F (37.3 C) (Oral)   Wt 180 lb (81.6 kg)   BMI  28.62 kg/m      Review of Systems  Constitutional: Positive for fatigue and fever.  HENT: Negative for congestion, dental problem, hearing loss, rhinorrhea, sinus pressure, sore throat and tinnitus.   Eyes: Negative for pain, discharge and visual disturbance.  Respiratory: Negative for cough and shortness of breath.   Cardiovascular: Negative for chest pain, palpitations and leg swelling.  Gastrointestinal: Positive for abdominal distention, abdominal pain, constipation and nausea. Negative for blood in stool, diarrhea and vomiting.  Genitourinary: Negative for difficulty urinating, dysuria, flank pain, frequency, hematuria, pelvic pain, urgency, vaginal bleeding, vaginal discharge and vaginal pain.  Musculoskeletal: Negative for arthralgias, gait problem and joint swelling.  Skin: Negative for rash.  Neurological: Negative for dizziness, syncope, speech difficulty, weakness, numbness and headaches.  Hematological: Negative for adenopathy.  Psychiatric/Behavioral: Negative for agitation, behavioral problems and dysphoric mood. The patient is not nervous/anxious.        Objective:   Physical Exam  Constitutional: She is oriented to person, place, and time. She appears well-developed and well-nourished.  HENT:  Head: Normocephalic.  Right Ear: External ear normal.  Left Ear: External ear normal.  Mouth/Throat: Oropharynx is clear and moist.  Eyes: Pupils are equal, round, and reactive to light. Conjunctivae and EOM are normal.  Neck: Normal range of motion. Neck supple. No thyromegaly present.  Cardiovascular: Normal rate, regular rhythm, normal heart sounds and intact distal pulses.  Pulmonary/Chest: Effort normal and breath sounds normal.  Abdominal: Soft. Bowel sounds are normal. She exhibits no distension and no mass. There is no tenderness. There is no rebound and no guarding.  Musculoskeletal: Normal range of motion.  Lymphadenopathy:    She has no cervical adenopathy.    Neurological: She is alert and oriented to person, place, and time.  Skin: Skin is warm and dry. No rash noted.  Psychiatric: She has a normal mood and affect. Her behavior is normal.          Assessment & Plan:  Episodic abdominal pain associated with nausea and anorexia  .  Will check a gallbladder ultrasound. Challenge with a short-term lactose-free diet  Consider GI evaluation for consideration of EGD if unimproved  Nyoka Cowden

## 2017-09-08 ENCOUNTER — Ambulatory Visit: Payer: Medicare Other | Admitting: Internal Medicine

## 2017-09-20 ENCOUNTER — Ambulatory Visit
Admission: RE | Admit: 2017-09-20 | Discharge: 2017-09-20 | Disposition: A | Discharge status: Home / Self Care | Payer: Medicare Other | Source: Ambulatory Visit | Attending: Internal Medicine | Admitting: Internal Medicine

## 2017-09-20 DIAGNOSIS — R1010 Upper abdominal pain, unspecified: Secondary | ICD-10-CM

## 2017-09-22 ENCOUNTER — Encounter: Payer: Self-pay | Admitting: Internal Medicine

## 2017-09-23 ENCOUNTER — Other Ambulatory Visit: Payer: Self-pay | Admitting: Internal Medicine

## 2017-09-23 DIAGNOSIS — K8020 Calculus of gallbladder without cholecystitis without obstruction: Secondary | ICD-10-CM

## 2017-12-06 ENCOUNTER — Other Ambulatory Visit: Payer: Self-pay | Admitting: Internal Medicine

## 2017-12-28 ENCOUNTER — Other Ambulatory Visit: Payer: Self-pay | Admitting: Surgery

## 2018-01-03 ENCOUNTER — Other Ambulatory Visit: Payer: Self-pay | Admitting: Internal Medicine

## 2018-01-03 MED ORDER — ALPRAZOLAM 0.25 MG PO TABS: 0.2500 mg | ORAL_TABLET | Freq: Two times a day (BID) | ORAL | 0 refills | Status: DC | PRN

## 2018-01-04 NOTE — Pre-Procedure Instructions (Signed)
Glenda [name]  01/04/2018      [facility] [address] [place], [place] [address] [place] [address] Phone: [phone] Fax: [phone]    Your procedure is scheduled on August 27  Report to [facility] at [place].M.  Call this number if you have problems the morning of surgery:  [phone]   Remember:  Do not eat or drink after midnight.      Take these medicines the morning of surgery with A SIP OF WATER  ALPRAZolam (XANAX metoprolol succinate (TOPROL-XL)  PARoxetine (PAXIL)  7 days prior to surgery STOP taking any Aspirin(unless otherwise instructed by your surgeon), Aleve, Naproxen, Ibuprofen, Motrin, Advil, Goody's, BC's, all herbal medications, fish oil, and all vitamins     Do not wear jewelry, make-up or nail polish.  Do not wear lotions, powders, or perfumes, or deodorant.  Do not shave 48 hours prior to surgery.    Do not bring valuables to the hospital.  [facility] is not responsible for any belongings or valuables.  Contacts, dentures or bridgework may not be worn into surgery.  Leave your suitcase in the car.  After surgery it may be brought to your room.  For patients admitted to the hospital, discharge time will be determined by your treatment team.  Patients discharged the day of surgery will not be allowed to drive home.    Special instructions:   [facility]- Preparing For Surgery  Before surgery, you can play an important role. Because skin is not sterile, your skin needs to be as free of germs as possible. You can reduce the number of germs on your skin by washing with CHG (chlorahexidine gluconate) Soap before surgery.  CHG is an antiseptic cleaner which kills germs and bonds with the skin to continue killing germs even after washing.    Oral Hygiene is also important to reduce your risk of infection.  Remember - BRUSH YOUR TEETH THE MORNING OF SURGERY WITH YOUR REGULAR TOOTHPASTE  Please do  not use if you have an allergy to CHG or antibacterial soaps. If your skin becomes reddened/irritated stop using the CHG.  Do not shave (including legs and underarms) for at least 48 hours prior to first CHG shower. It is OK to shave your face.  Please follow these instructions carefully.   1. Shower the NIGHT BEFORE SURGERY and the MORNING OF SURGERY with CHG.   2. If you chose to wash your hair, wash your hair first as usual with your normal shampoo.  3. After you shampoo, rinse your hair and body thoroughly to remove the shampoo.  4. Use CHG as you would any other liquid soap. You can apply CHG directly to the skin and wash gently with a scrungie or a clean washcloth.   5. Apply the CHG Soap to your body ONLY FROM THE NECK DOWN.  Do not use on open wounds or open sores. Avoid contact with your eyes, ears, mouth and genitals (private parts). Wash Face and genitals (private parts)  with your normal soap.  6. Wash thoroughly, paying special attention to the area where your surgery will be performed.  7. Thoroughly rinse your body with warm water from the neck down.  8. DO NOT shower/wash with your normal soap after using and rinsing off the CHG Soap.  9. Pat yourself dry with a CLEAN TOWEL.  10. Wear CLEAN PAJAMAS to bed the night before surgery, wear comfortable  clothes the morning of surgery  11. Place CLEAN SHEETS on your bed the night of your first shower and DO NOT SLEEP WITH PETS.    Day of Surgery:  Do not apply any deodorants/lotions.  Please wear clean clothes to the hospital/surgery center.   Remember to brush your teeth WITH YOUR REGULAR TOOTHPASTE.    Please read over the following fact sheets that you were given.

## 2018-01-05 ENCOUNTER — Other Ambulatory Visit: Payer: Self-pay

## 2018-01-05 ENCOUNTER — Encounter (HOSPITAL_COMMUNITY): Payer: Self-pay

## 2018-01-05 ENCOUNTER — Encounter (HOSPITAL_COMMUNITY)
Admission: RE | Admit: 2018-01-05 | Discharge: 2018-01-05 | Disposition: A | Discharge status: Home / Self Care | Payer: Medicare Other | Source: Ambulatory Visit | Attending: Surgery | Admitting: Surgery

## 2018-01-05 DIAGNOSIS — Z9842 Cataract extraction status, left eye: Secondary | ICD-10-CM | POA: Insufficient documentation

## 2018-01-05 DIAGNOSIS — F419 Anxiety disorder, unspecified: Secondary | ICD-10-CM | POA: Diagnosis not present

## 2018-01-05 DIAGNOSIS — I10 Essential (primary) hypertension: Secondary | ICD-10-CM | POA: Insufficient documentation

## 2018-01-05 DIAGNOSIS — K829 Disease of gallbladder, unspecified: Secondary | ICD-10-CM | POA: Insufficient documentation

## 2018-01-05 DIAGNOSIS — Z01812 Encounter for preprocedural laboratory examination: Secondary | ICD-10-CM | POA: Diagnosis not present

## 2018-01-05 DIAGNOSIS — Z9841 Cataract extraction status, right eye: Secondary | ICD-10-CM | POA: Insufficient documentation

## 2018-01-05 DIAGNOSIS — Z79899 Other long term (current) drug therapy: Secondary | ICD-10-CM | POA: Insufficient documentation

## 2018-01-05 DIAGNOSIS — Z86010 Personal history of colonic polyps: Secondary | ICD-10-CM | POA: Diagnosis not present

## 2018-01-05 DIAGNOSIS — Z01818 Encounter for other preprocedural examination: Secondary | ICD-10-CM | POA: Insufficient documentation

## 2018-01-05 DIAGNOSIS — Z87891 Personal history of nicotine dependence: Secondary | ICD-10-CM | POA: Insufficient documentation

## 2018-01-05 DIAGNOSIS — R7303 Prediabetes: Secondary | ICD-10-CM | POA: Diagnosis not present

## 2018-01-05 HISTORY — DX: Prediabetes: R73.03

## 2018-01-05 LAB — CBC
HEMATOCRIT: 43.3 % (ref 36.0–46.0)
HEMOGLOBIN: 14.2 g/dL (ref 12.0–15.0)
MCH: 31.3 pg (ref 26.0–34.0)
MCHC: 32.8 g/dL (ref 30.0–36.0)
MCV: 95.6 fL (ref 78.0–100.0)
Platelets: 387 10*3/uL (ref 150–400)
RBC: 4.53 MIL/uL (ref 3.87–5.11)
RDW: 12.8 % (ref 11.5–15.5)
WBC: 6.4 10*3/uL (ref 4.0–10.5)

## 2018-01-05 LAB — BASIC METABOLIC PANEL
Anion gap: 12 (ref 5–15)
BUN: 5 mg/dL — AB (ref 8–23)
CALCIUM: 9.1 mg/dL (ref 8.9–10.3)
CO2: 22 mmol/L (ref 22–32)
Chloride: 107 mmol/L (ref 98–111)
Creatinine, Ser: 0.72 mg/dL (ref 0.44–1.00)
GFR calc Af Amer: >60 mL/min (ref >60)
GFR calc non Af Amer: >60 mL/min (ref >60)
GLUCOSE: 100 mg/dL — AB (ref 70–99)
Potassium: 4.1 mmol/L (ref 3.5–5.1)
Sodium: 141 mmol/L (ref 135–145)

## 2018-01-05 LAB — HEMOGLOBIN A1C
HEMOGLOBIN A1C: 5.2 % (ref 4.8–5.6)
MEAN PLASMA GLUCOSE: 102.54 mg/dL

## 2018-01-05 NOTE — Progress Notes (Signed)
PCP - Bluford Kaufmann Cardiologist - denies  Chest x-ray - denies EKG - 01/05/18 Stress Test - denies ECHO - denies Cardiac Cath - denies  Anesthesia review: NO    Patient denies shortness of breath, fever, and chest pain at PAT appointment Patient c/o of cough without fever, spoke to [facility] regarding cough instructed patient to see PCP if it gets worse   Patient verbalized understanding of instructions that were given to them at the PAT appointment. Patient was also instructed that they will need to review over the PAT instructions again at home before surgery.

## 2018-01-06 NOTE — Progress Notes (Signed)
Anesthesia Chart Review:  Case:  [id number] Date/Time:  01/11/18 0715   Procedure:  LAPAROSCOPIC CHOLECYSTECTOMY WITH INTRAOPERATIVE CHOLANGIOGRAM ERAS PATHWAY (N/A )   Anesthesia type:  General   Pre-op diagnosis:  Gallbladder disease   Location:  [facility] OR ROOM 08 / [facility]   Surgeon:  Alphonsa Overall, MD      DISCUSSION: Patient is a 67 year old female scheduled for the above procedure. History includes smoking, HTN, PSVT (coumented on or before 12/15/06), pre-diabetes. She denied seeing a cardiologist or previous stress or echo.   No fever, SOB, or chest pain. She reported some residual cough after head cold, but improving. Advised if worsening before surgery then to seek further medical evaluation.   Preoperative EKG showed unusual p axis. Remote history of PSVT. No afib. No CV symptoms reported. Discussed with anesthesiologist Albertha Ghee, MD. If no acute changes then it is anticipated she can proceed as planned.    VS: BP (!) 145/68   Pulse 86   Temp 36.9 C   Resp 20   Ht 5\' 8"  (1.727 m)   Wt 79.5 kg   SpO2 97%   BMI 26.64 kg/m   PROVIDERS: Marletta Lor, MD is PCP     LABS: Labs reviewed: Acceptable for surgery. A1c 5.2.  (all labs ordered are listed, but only abnormal results are displayed)  Labs Reviewed  BASIC METABOLIC PANEL - Abnormal; Notable for the following components:      Result Value   Glucose, Bld 100 (*)    BUN 5 (*)    All other components within normal limits  HEMOGLOBIN A1C  CBC    EKG: Unusual P axis, possible ectopic atrial rhythm with PACs. Low voltage QRS. Q wave in III. Overall, I think p wave morphology is not that changed from her 12/18/02 tracing in [place].    CV: N/A  Past Medical History:  Diagnosis Date  . Allergic rhinitis   . Anxiety   . History of colonic polyps   . History of PSVT (paroxysmal supraventricular tachycardia)   . Hypertension   . Impaired glucose tolerance   . Obesity   . Pre-diabetes    hx, not currently being  monitor for diabetes, diet controlled  . Tobacco user   . Vertigo    started 3 weeks ago- after fluid trapped in ear    Past Surgical History:  Procedure Laterality Date  . ankle sx-left fx    . CATARACT EXTRACTION     both eyes  . COLONOSCOPY  04/23/2005  . foot sx     with the ankle surgery    MEDICATIONS: . ALPRAZolam (XANAX) 0.25 MG tablet  . diphenhydrAMINE (BENADRYL) 25 mg capsule  . ibuprofen (ADVIL,MOTRIN) 200 MG tablet  . metoprolol succinate (TOPROL-XL) 25 MG 24 hr tablet  . PARoxetine (PAXIL) 40 MG tablet   No current facility-administered medications for this encounter.     George Hugh [facility] Short Stay Center/Anesthesiology Phone [phone] 01/06/2018 2:22 PM

## 2018-01-10 ENCOUNTER — Encounter (HOSPITAL_COMMUNITY): Payer: Self-pay | Admitting: Anesthesiology

## 2018-01-10 NOTE — H&P (Signed)
Glenda [name]  Location: [facility] Surgery Patient #: [id number] DOB: [birth year] Married / Language: Undefined / Race: White Female  History of Present Illness   The patient is a 67 year old female who presents with a complaint of gall bladder disease.  The PCP is Dr. [name]  The patient was referred by Dr. [name]  She comes by herself.  Over the last 6 months or so, she's had 4 episodes of pressure in her epigastrium. This seems the last 4-7 days. He does not lateralize well. She cannot really say is associated with food. She a She underwent an [place] of her abdomen on 09/20/2017. She was found to have several mobile gallstons. She has switched to a low dairy diet, and now has gone several weeks without any further symptoms. She does have some problems with constipation which is mild. Her last colonoscopy was approximately 4 years ago by Dr. Lucio Edward (06/15/2013). Sort next colonoscopies in the next year or 2. She's had no prior abdominal surgery. Her mother's only family member who had gallbladder disease.   I discussed with the patient the indications and risks of gall bladder surgery. The primary risks of gall bladder surgery include, but are not limited to, bleeding, infection, common bile duct injury, and open surgery. There is also the risk that the patient may have continued symptoms after surgery. We discussed the typical post-operative recovery course. I tried to answer the patient's questions. I gave the patient literature about gall bladder surgery.  Plan: 1) At this time, she is doing well with her low dairy diet and wants to wait for any surgery. 2) She may consider gall bladder surgery in the winter. She knows to call for any problem ---> She changed her mind  Past Medical History: 1. HTN 2. History of PSVT (paroxysmal supraventricular tachycardia) This happened when she was pregnant. She has not seen a  cardiologist in a long time. She is on Toprol  Social History: Married. Her mother is Glenda [name] (now 91 yo - I took care of a gb perc drain for her - I did her cholecystectomy)   Past Surgical History [name]; 10/27/2017 10:34 AM) Cataract Surgery  Bilateral. Colon Polyp Removal - Colonoscopy  Foot Surgery  Left.  Diagnostic Studies History [name]; 10/27/2017 10:34 AM) Colonoscopy  1-5 years ago Mammogram  >3 years ago Pap Smear  >5 years ago  Allergies [name]; 10/27/2017 10:35 AM) Amoxicillin *PENICILLINS*  Rash.  Medication History [name]; 10/27/2017 10:37 AM) Metoprolol Succinate ER (25MG  Tablet ER 24HR, Oral) Active. PARoxetine HCl (40MG  Tablet, Oral) Active. Medications Reconciled  Social History [name]; 10/27/2017 10:34 AM) Alcohol use  Moderate alcohol use. Caffeine use  Coffee, Tea. No drug use  Tobacco use  Current every day smoker.  Family History [name]; 10/27/2017 10:34 AM) Arthritis  Mother. Heart Disease  Brother, Mother. Heart disease in female family member before age 35  Hypertension  Mother.  Pregnancy / Birth History [name]; 10/27/2017 10:34 AM) Age at menarche  46 years. Age of menopause  52-50 Contraceptive History  Oral contraceptives. Gravida  3 Length (months) of breastfeeding  7-12 Maternal age  64-30 Para  2 Regular periods   Other Problems [name]; 10/27/2017 10:34 AM) Anxiety Disorder  Cholelithiasis  Hemorrhoids  High blood pressure  Other disease, cancer, significant illness     Review of Systems [name]; 10/27/2017 10:34 AM) General Not Present- Appetite Loss, Chills, Fatigue, Fever, Night  Sweats, Weight Gain and Weight Loss. Skin Not Present- Change in Wart/Mole, Dryness, Hives, Jaundice, New Lesions, Non-Healing Wounds, Rash and Ulcer. HEENT Present- Seasonal Allergies and Wears glasses/contact lenses. Not Present- Earache,  Hearing Loss, Hoarseness, Nose Bleed, Oral Ulcers, Ringing in the Ears, Sinus Pain, Sore Throat, Visual Disturbances and Yellow Eyes. Respiratory Not Present- Bloody sputum, Chronic Cough, Difficulty Breathing, Snoring and Wheezing. Breast Not Present- Breast Mass, Breast Pain, Nipple Discharge and Skin Changes. Cardiovascular Present- Palpitations and Rapid Heart Rate. Not Present- Chest Pain, Difficulty Breathing Lying Down, Leg Cramps, Shortness of Breath and Swelling of Extremities. Gastrointestinal Present- Excessive gas. Not Present- Abdominal Pain, Bloating, Bloody Stool, Change in Bowel Habits, Chronic diarrhea, Constipation, Difficulty Swallowing, Gets full quickly at meals, Hemorrhoids, Indigestion, Nausea, Rectal Pain and Vomiting. Female Genitourinary Not Present- Frequency, Nocturia, Painful Urination, Pelvic Pain and Urgency. Musculoskeletal Present- Joint Stiffness and Muscle Pain. Not Present- Back Pain, Joint Pain, Muscle Weakness and Swelling of Extremities. Neurological Not Present- Decreased Memory, Fainting, Headaches, Numbness, Seizures, Tingling, Tremor, Trouble walking and Weakness. Psychiatric Present- Anxiety. Not Present- Bipolar, Change in Sleep Pattern, Depression, Fearful and Frequent crying. Endocrine Not Present- Cold Intolerance, Excessive Hunger, Hair Changes, Heat Intolerance, Hot flashes and New Diabetes. Hematology Not Present- Blood Thinners, Easy Bruising, Excessive bleeding, Gland problems, HIV and Persistent Infections.  Vitals [name]; 10/27/2017 10:36 AM) 10/27/2017 10:36 AM Weight: 185.25 lb Height: 67in Body Surface Area: 1.96 m Body Mass Index: 29.01 kg/m  Temp.: 98.68F(Oral)  Pulse: 78 (Regular)  BP: 134/84 (Sitting, Left Arm, Standard)   Physical Exam  General: WN older WF alert and generally healthy appearing. Skin: Inspection and palpation of the skin unremarkable.  Eyes: Conjunctivae white, pupils equal. Face, ears, nose,  mouth, and throat: Face - normal. Normal ears and nose. Lips and teeth normal.  Neck: Supple. No mass. Trachea midline. No thyroid mass. Lymph Nodes: No supraclavicular or cervical adenopathy. No axillary adenopathy.  Lungs: Normal respiratory effort. Clear to auscultation and symmetric breath sounds. Cardiovascular: Regular rate and rythm. Normal auscultation of the heart. No murmur or rub.  Abdomen: Soft. No mass. Liver and spleen not palpable. No tenderness. No hernia. Normal bowel sounds. No abdominal scars. Rectal: Not done.  Musculoskeletal/extremities: Normal gait. Good strength and ROM in upper and lower extremities.  Neurologic: Grossly intact to motor and sensory function. Psychiatric: Has normal mood and affect. Judgement and insight appear normal.   Assessment & Plan  1.  GALL BLADDER DISEASE (K82.9)  Plan:  At first she wanted to wait and see how she does.  Addendum Note(Bethani Brugger H. Lucia Gaskins MD; 12/23/2017 6:00 PM) The patient called and talked to April. She is having more symptoms and wants to go ahead with surgery.  2. HTN 3. History of PSVT (paroxysmal supraventricular tachycardia) This happened when she was pregnant. She has not seen a cardiologist in a long time. She is on Toprol      Alphonsa Overall, MD, [facility] Pager: [phone] Office phone:  [phone]

## 2018-01-10 NOTE — Anesthesia Preprocedure Evaluation (Addendum)
Anesthesia Evaluation  Patient identified by MRN, date of birth, ID band Patient awake    Reviewed: Allergy & Precautions, NPO status , Patient's Chart, lab work & pertinent test results  Airway Mallampati: I       Dental  (+) Dental Advisory Given, Chipped, Teeth Intact,    Pulmonary Current Smoker,    Pulmonary exam normal breath sounds clear to auscultation       Cardiovascular hypertension, Pt. on home beta blockers Normal cardiovascular exam Rhythm:Regular Rate:Normal     Neuro/Psych PSYCHIATRIC DISORDERS Anxiety    GI/Hepatic   Endo/Other    Renal/GU      Musculoskeletal   Abdominal Normal abdominal exam  (+)   Peds  Hematology   Anesthesia Other Findings   Reproductive/Obstetrics                           Anesthesia Physical Anesthesia Plan  ASA: II  Anesthesia Plan: General   Post-op Pain Management:    Induction: Intravenous  PONV Risk Score and Plan: 4 or greater and Ondansetron and Dexamethasone  Airway Management Planned: Oral ETT  Additional Equipment:   Intra-op Plan:   Post-operative Plan: Extubation in OR  Informed Consent: I have reviewed the patients History and Physical, chart, labs and discussed the procedure including the risks, benefits and alternatives for the proposed anesthesia with the patient or authorized representative who has indicated his/her understanding and acceptance.   Dental advisory given  Plan Discussed with: CRNA and Surgeon  Anesthesia Plan Comments:        Anesthesia Quick Evaluation

## 2018-01-11 ENCOUNTER — Other Ambulatory Visit: Payer: Self-pay

## 2018-01-11 ENCOUNTER — Ambulatory Visit (HOSPITAL_COMMUNITY): Payer: Medicare Other

## 2018-01-11 ENCOUNTER — Encounter (HOSPITAL_COMMUNITY)
Admission: RE | Disposition: A | Discharge status: Home / Self Care | Payer: Self-pay | Source: Ambulatory Visit | Attending: Surgery

## 2018-01-11 ENCOUNTER — Ambulatory Visit (HOSPITAL_COMMUNITY): Payer: Medicare Other | Admitting: Anesthesiology

## 2018-01-11 ENCOUNTER — Encounter (HOSPITAL_COMMUNITY): Payer: Self-pay | Admitting: General Practice

## 2018-01-11 ENCOUNTER — Ambulatory Visit (HOSPITAL_COMMUNITY)
Admission: RE | Admit: 2018-01-11 | Discharge: 2018-01-11 | Disposition: A | Discharge status: Home / Self Care | Payer: Medicare Other | Source: Ambulatory Visit | Attending: Surgery | Admitting: Surgery

## 2018-01-11 ENCOUNTER — Ambulatory Visit (HOSPITAL_COMMUNITY): Payer: Medicare Other | Admitting: Physician Assistant

## 2018-01-11 DIAGNOSIS — K8010 Calculus of gallbladder with chronic cholecystitis without obstruction: Secondary | ICD-10-CM | POA: Insufficient documentation

## 2018-01-11 DIAGNOSIS — I10 Essential (primary) hypertension: Secondary | ICD-10-CM | POA: Insufficient documentation

## 2018-01-11 DIAGNOSIS — Z419 Encounter for procedure for purposes other than remedying health state, unspecified: Secondary | ICD-10-CM

## 2018-01-11 DIAGNOSIS — F17210 Nicotine dependence, cigarettes, uncomplicated: Secondary | ICD-10-CM | POA: Insufficient documentation

## 2018-01-11 DIAGNOSIS — F419 Anxiety disorder, unspecified: Secondary | ICD-10-CM | POA: Insufficient documentation

## 2018-01-11 HISTORY — PX: CHOLECYSTECTOMY: SHX55

## 2018-01-11 SURGERY — LAPAROSCOPIC CHOLECYSTECTOMY WITH INTRAOPERATIVE CHOLANGIOGRAM
Anesthesia: General | Site: Abdomen

## 2018-01-11 MED ORDER — OXYCODONE HCL 5 MG/5ML PO SOLN: 5.0000 mg | Freq: Once | ORAL | Status: AC | PRN

## 2018-01-11 MED ORDER — ACETAMINOPHEN 325 MG PO TABS: 325.0000 mg | ORAL_TABLET | ORAL | Status: DC | PRN

## 2018-01-11 MED ORDER — KETOROLAC TROMETHAMINE 30 MG/ML IJ SOLN
INTRAMUSCULAR | Status: AC
  Filled 2018-01-11: qty 1

## 2018-01-11 MED ORDER — PHENYLEPHRINE 40 MCG/ML (10ML) SYRINGE FOR IV PUSH (FOR BLOOD PRESSURE SUPPORT)
PREFILLED_SYRINGE | INTRAVENOUS | Status: DC | PRN
  Administered 2018-01-11: 80 ug via INTRAVENOUS

## 2018-01-11 MED ORDER — MEPERIDINE HCL 50 MG/ML IJ SOLN: 6.2500 mg | INTRAMUSCULAR | Status: DC | PRN

## 2018-01-11 MED ORDER — ROCURONIUM BROMIDE 50 MG/5ML IV SOSY
PREFILLED_SYRINGE | INTRAVENOUS | Status: AC
  Filled 2018-01-11: qty 5

## 2018-01-11 MED ORDER — HYDROCODONE-ACETAMINOPHEN 5-325 MG PO TABS: 1.0000 | ORAL_TABLET | Freq: Four times a day (QID) | ORAL | 0 refills | Status: DC | PRN

## 2018-01-11 MED ORDER — FENTANYL CITRATE (PF) 250 MCG/5ML IJ SOLN
INTRAMUSCULAR | Status: AC
  Filled 2018-01-11: qty 5

## 2018-01-11 MED ORDER — BUPIVACAINE-EPINEPHRINE 0.25% -1:200000 IJ SOLN
INTRAMUSCULAR | Status: DC | PRN
  Administered 2018-01-11: 30 mL

## 2018-01-11 MED ORDER — BUPIVACAINE-EPINEPHRINE (PF) 0.25% -1:200000 IJ SOLN
INTRAMUSCULAR | Status: AC
  Filled 2018-01-11: qty 30

## 2018-01-11 MED ORDER — CEFAZOLIN SODIUM-DEXTROSE 2-4 GM/100ML-% IV SOLN
2.0000 g | INTRAVENOUS | Status: AC
  Administered 2018-01-11: 2 g via INTRAVENOUS
  Filled 2018-01-11: qty 100

## 2018-01-11 MED ORDER — ONDANSETRON HCL 4 MG/2ML IJ SOLN: 4.0000 mg | Freq: Once | INTRAMUSCULAR | Status: DC | PRN

## 2018-01-11 MED ORDER — CHLORHEXIDINE GLUCONATE CLOTH 2 % EX PADS: 6.0000 | MEDICATED_PAD | Freq: Once | CUTANEOUS | Status: DC

## 2018-01-11 MED ORDER — IOPAMIDOL (ISOVUE-300) INJECTION 61%
INTRAVENOUS | Status: AC
  Filled 2018-01-11: qty 50

## 2018-01-11 MED ORDER — DEXAMETHASONE SODIUM PHOSPHATE 10 MG/ML IJ SOLN
INTRAMUSCULAR | Status: AC
  Filled 2018-01-11: qty 1

## 2018-01-11 MED ORDER — DEXAMETHASONE SODIUM PHOSPHATE 4 MG/ML IJ SOLN
INTRAMUSCULAR | Status: DC | PRN
  Administered 2018-01-11: 8 mg via INTRAVENOUS

## 2018-01-11 MED ORDER — 0.9 % SODIUM CHLORIDE (POUR BTL) OPTIME
TOPICAL | Status: DC | PRN
  Administered 2018-01-11: 1000 mL

## 2018-01-11 MED ORDER — SODIUM CHLORIDE 0.9 % IR SOLN
Status: DC | PRN
  Administered 2018-01-11 (×2): 1000 mL

## 2018-01-11 MED ORDER — OXYCODONE HCL 5 MG PO TABS
5.0000 mg | ORAL_TABLET | Freq: Once | ORAL | Status: AC | PRN
  Administered 2018-01-11: 5 mg via ORAL

## 2018-01-11 MED ORDER — LIDOCAINE 2% (20 MG/ML) 5 ML SYRINGE
INTRAMUSCULAR | Status: AC
  Filled 2018-01-11: qty 5

## 2018-01-11 MED ORDER — ACETAMINOPHEN 500 MG PO TABS
1000.0000 mg | ORAL_TABLET | ORAL | Status: AC
  Administered 2018-01-11: 1000 mg via ORAL
  Filled 2018-01-11: qty 2

## 2018-01-11 MED ORDER — PROPOFOL 10 MG/ML IV BOLUS
INTRAVENOUS | Status: DC | PRN
  Administered 2018-01-11: 170 mg via INTRAVENOUS

## 2018-01-11 MED ORDER — GABAPENTIN 300 MG PO CAPS
300.0000 mg | ORAL_CAPSULE | ORAL | Status: AC
  Administered 2018-01-11: 300 mg via ORAL
  Filled 2018-01-11: qty 1

## 2018-01-11 MED ORDER — ACETAMINOPHEN 160 MG/5ML PO SOLN: 325.0000 mg | ORAL | Status: DC | PRN

## 2018-01-11 MED ORDER — LIDOCAINE 2% (20 MG/ML) 5 ML SYRINGE
INTRAMUSCULAR | Status: DC | PRN
  Administered 2018-01-11: 100 mg via INTRAVENOUS

## 2018-01-11 MED ORDER — ONDANSETRON HCL 4 MG/2ML IJ SOLN
INTRAMUSCULAR | Status: AC
  Filled 2018-01-11: qty 2

## 2018-01-11 MED ORDER — MIDAZOLAM HCL 2 MG/2ML IJ SOLN
INTRAMUSCULAR | Status: AC
  Filled 2018-01-11: qty 2

## 2018-01-11 MED ORDER — PROPOFOL 10 MG/ML IV BOLUS
INTRAVENOUS | Status: AC
  Filled 2018-01-11: qty 20

## 2018-01-11 MED ORDER — SODIUM CHLORIDE 0.9 % IV SOLN
INTRAVENOUS | Status: DC | PRN
  Administered 2018-01-11: 15 mL

## 2018-01-11 MED ORDER — PHENYLEPHRINE 40 MCG/ML (10ML) SYRINGE FOR IV PUSH (FOR BLOOD PRESSURE SUPPORT)
PREFILLED_SYRINGE | INTRAVENOUS | Status: AC
  Filled 2018-01-11: qty 10

## 2018-01-11 MED ORDER — KETOROLAC TROMETHAMINE 30 MG/ML IJ SOLN
30.0000 mg | Freq: Once | INTRAMUSCULAR | Status: AC | PRN
  Administered 2018-01-11: 30 mg via INTRAVENOUS

## 2018-01-11 MED ORDER — SUGAMMADEX SODIUM 200 MG/2ML IV SOLN
INTRAVENOUS | Status: DC | PRN
  Administered 2018-01-11: 200 mg via INTRAVENOUS

## 2018-01-11 MED ORDER — STERILE WATER FOR IRRIGATION IR SOLN
Status: DC | PRN
  Administered 2018-01-11: 1000 mL

## 2018-01-11 MED ORDER — FENTANYL CITRATE (PF) 100 MCG/2ML IJ SOLN
INTRAMUSCULAR | Status: DC | PRN
  Administered 2018-01-11 (×3): 50 ug via INTRAVENOUS
  Administered 2018-01-11: 100 ug via INTRAVENOUS

## 2018-01-11 MED ORDER — ONDANSETRON HCL 4 MG/2ML IJ SOLN
INTRAMUSCULAR | Status: DC | PRN
  Administered 2018-01-11: 4 mg via INTRAVENOUS

## 2018-01-11 MED ORDER — LACTATED RINGERS IV SOLN
INTRAVENOUS | Status: DC | PRN
  Administered 2018-01-11 (×2): via INTRAVENOUS

## 2018-01-11 MED ORDER — FENTANYL CITRATE (PF) 100 MCG/2ML IJ SOLN: 25.0000 ug | INTRAMUSCULAR | Status: DC | PRN

## 2018-01-11 MED ORDER — MIDAZOLAM HCL 5 MG/5ML IJ SOLN
INTRAMUSCULAR | Status: DC | PRN
  Administered 2018-01-11: 2 mg via INTRAVENOUS

## 2018-01-11 MED ORDER — OXYCODONE HCL 5 MG PO TABS
ORAL_TABLET | ORAL | Status: AC
  Filled 2018-01-11: qty 1

## 2018-01-11 MED ORDER — ROCURONIUM BROMIDE 100 MG/10ML IV SOLN
INTRAVENOUS | Status: DC | PRN
  Administered 2018-01-11: 50 mg via INTRAVENOUS
  Administered 2018-01-11 (×2): 10 mg via INTRAVENOUS

## 2018-01-11 SURGICAL SUPPLY — 50 items
APPLIER CLIP 5 13 M/L LIGAMAX5 (MISCELLANEOUS) ×2
CANISTER SUCT 3000ML PPV (MISCELLANEOUS) ×2 IMPLANT
CHLORAPREP W/TINT 26ML (MISCELLANEOUS) ×2 IMPLANT
CHOLANGIOGRAM CATH TAUT (CATHETERS) ×2 IMPLANT
CLIP APPLIE 5 13 M/L LIGAMAX5 (MISCELLANEOUS) ×1 IMPLANT
COVER MAYO STAND STRL (DRAPES) ×2 IMPLANT
COVER SURGICAL LIGHT HANDLE (MISCELLANEOUS) ×2 IMPLANT
CUTTER FLEX LINEAR 45M (STAPLE) ×2 IMPLANT
DERMABOND ADVANCED (GAUZE/BANDAGES/DRESSINGS) ×1
DERMABOND ADVANCED .7 DNX12 (GAUZE/BANDAGES/DRESSINGS) ×1 IMPLANT
DRAPE C-ARM 42X72 X-RAY (DRAPES) ×2 IMPLANT
ELECT REM PT RETURN 9FT ADLT (ELECTROSURGICAL) ×2
ELECTRODE REM PT RTRN 9FT ADLT (ELECTROSURGICAL) ×1 IMPLANT
ENDOLOOP SUT PDS II  0 18 (SUTURE) ×1
ENDOLOOP SUT PDS II 0 18 (SUTURE) ×1 IMPLANT
GLOVE BIO SURGEON STRL SZ 6 (GLOVE) ×2 IMPLANT
GLOVE BIOGEL PI IND STRL 6.5 (GLOVE) ×2 IMPLANT
GLOVE BIOGEL PI IND STRL 7.0 (GLOVE) ×1 IMPLANT
GLOVE BIOGEL PI INDICATOR 6.5 (GLOVE) ×2
GLOVE BIOGEL PI INDICATOR 7.0 (GLOVE) ×1
GLOVE INDICATOR 6.5 STRL GRN (GLOVE) ×2 IMPLANT
GLOVE SURG SIGNA 7.5 PF LTX (GLOVE) ×2 IMPLANT
GLOVE SURG SS PI 6.5 STRL IVOR (GLOVE) ×6 IMPLANT
GOWN STRL REUS W/ TWL LRG LVL3 (GOWN DISPOSABLE) ×3 IMPLANT
GOWN STRL REUS W/ TWL XL LVL3 (GOWN DISPOSABLE) ×2 IMPLANT
GOWN STRL REUS W/TWL 2XL LVL3 (GOWN DISPOSABLE) ×2 IMPLANT
GOWN STRL REUS W/TWL LRG LVL3 (GOWN DISPOSABLE) ×3
GOWN STRL REUS W/TWL XL LVL3 (GOWN DISPOSABLE) ×2
IV CATH 14GX2 1/4 (CATHETERS) ×2 IMPLANT
IV SOD CHL 0.9% 1000ML (IV SOLUTION) ×2 IMPLANT
KIT BASIN OR (CUSTOM PROCEDURE TRAY) ×2 IMPLANT
KIT TURNOVER KIT B (KITS) ×2 IMPLANT
NS IRRIG 1000ML POUR BTL (IV SOLUTION) ×2 IMPLANT
PAD ARMBOARD 7.5X6 YLW CONV (MISCELLANEOUS) ×2 IMPLANT
POUCH RETRIEVAL ECOSAC 10 (ENDOMECHANICALS) ×1 IMPLANT
POUCH RETRIEVAL ECOSAC 10MM (ENDOMECHANICALS) ×1
RELOAD STAPLE TA45 3.5 REG BLU (ENDOMECHANICALS) ×2 IMPLANT
SCISSORS LAP 5X35 DISP (ENDOMECHANICALS) ×2 IMPLANT
SET IRRIG TUBING LAPAROSCOPIC (IRRIGATION / IRRIGATOR) ×2 IMPLANT
SLEEVE ENDOPATH XCEL 5M (ENDOMECHANICALS) ×4 IMPLANT
SPECIMEN JAR SMALL (MISCELLANEOUS) ×2 IMPLANT
STOPCOCK 4 WAY LG BORE MALE ST (IV SETS) ×2 IMPLANT
SUT MNCRL AB 4-0 PS2 18 (SUTURE) ×2 IMPLANT
TRAY LAPAROSCOPIC MC (CUSTOM PROCEDURE TRAY) ×2 IMPLANT
TROCAR XCEL BLUNT TIP 100MML (ENDOMECHANICALS) ×2 IMPLANT
TROCAR XCEL NON-BLD 11X100MML (ENDOMECHANICALS) ×2 IMPLANT
TROCAR XCEL NON-BLD 5MMX100MML (ENDOMECHANICALS) ×2 IMPLANT
TUBING EXTENTION W/L.L. (IV SETS) ×2 IMPLANT
TUBING INSUFFLATION (TUBING) ×2 IMPLANT
WATER STERILE IRR 1000ML POUR (IV SOLUTION) ×2 IMPLANT

## 2018-01-11 NOTE — Discharge Instructions (Signed)
[facility] - DISCHARGE INSTRUCTIONS TO PATIENT  Activity:  Driving - May drive in 2 to 4 days, if doing well and off pain meds   Lifting - No lifting more than 15 pounds for 10 days, then no limit  Wound Care:   Leave incision dry for 2 days, then you may shower  Diet:  As tolerated  Follow up appointment:  Call Dr. Pollie Friar office [facility]) at [phone] for an appointment in 2 to 4 weeks  Medications and dosages:  Resume your home medications.  You have a prescription for:  Vicodin  Call Dr. Lucia Gaskins or his office  [phone]) if you have:  Temperature greater than 100.4,  Persistent nausea and vomiting,  Severe uncontrolled pain,  Redness, tenderness, or signs of infection (pain, swelling, redness, odor or green/yellow discharge around the site),  Difficulty breathing, headache or visual disturbances,  Any other questions or concerns you may have after discharge.  In an emergency, call 911 or go to an Emergency Department at a nearby hospital.

## 2018-01-11 NOTE — Anesthesia Procedure Notes (Signed)
Procedure Name: Intubation Date/Time: 01/11/2018 7:22 AM Performed by: Orlie Dakin, CRNA Pre-anesthesia Checklist: Patient identified, Emergency Drugs available, Suction available and Patient being monitored Patient Re-evaluated:Patient Re-evaluated prior to induction Oxygen Delivery Method: Circle system utilized Preoxygenation: Pre-oxygenation with 100% oxygen Induction Type: IV induction Ventilation: Mask ventilation without difficulty Laryngoscope Size: Miller and 3 Grade View: Grade II Tube type: Oral Tube size: 7.0 mm Number of attempts: 1 Airway Equipment and Method: Stylet Placement Confirmation: ETT inserted through vocal cords under direct vision,  breath sounds checked- equal and bilateral and positive ETCO2 Secured at: 22 cm Tube secured with: Tape Dental Injury: Teeth and Oropharynx as per pre-operative assessment  Comments: 4x4s bite block used.

## 2018-01-11 NOTE — Progress Notes (Signed)
Pt on [address] & up in recliner to maintain O2 sats> 90%.  She was 85-88% on RA. Strong, non-prod. cough effort. Instructed in use of IS-uses up to 1000cc. After getting OOB to chair, pt says she is having "palpitations", which "happens sometimes". Monitor show NSR 68-72, 115/59, pt in no apparent distress. Dr Jillyn Hidden fully updated-no new orders-will be by to see pt. Reassurance provided & will continue to monitor.

## 2018-01-11 NOTE — Interval H&P Note (Signed)
History and Physical Interval Note:  01/11/2018 7:11 AM  Glenda [name]  has presented today for surgery, with the diagnosis of Gallbladder disease  The various methods of treatment have been discussed with the patient and family.  Husband at bedside.  After consideration of risks, benefits and other options for treatment, the patient has consented to  Procedure(s): LAPAROSCOPIC CHOLECYSTECTOMY WITH INTRAOPERATIVE CHOLANGIOGRAM ERAS PATHWAY (N/A) as a surgical intervention .  The patient's history has been reviewed, patient examined, no change in status, stable for surgery.  I have reviewed the patient's chart and labs.  Questions were answered to the patient's satisfaction.     Shann Medal

## 2018-01-11 NOTE — Op Note (Addendum)
01/11/2018  9:27 AM  PATIENT:  Glenda [name], 67 y.o., female, MRN: [id number]  PREOP DIAGNOSIS:  Gallbladder disease  POSTOP DIAGNOSIS:   Severe chronic cholecystitis with cholelithiasis  PROCEDURE:   Procedure(s): LAPAROSCOPIC CHOLECYSTECTOMY  (5 trocar) [photos were taken]  SURGEON:   Alphonsa Overall, M.D.  Terrence DupontMamie Laurel, M.D.  ANESTHESIA:   general  Anesthesiologist: Lyn Hollingshead, MD CRNA: Verdie Drown, CRNA; Orlie Dakin, CRNA  General  ASA: 2  EBL:  minimal  ml  BLOOD ADMINISTERED: none  DRAINS: none   LOCAL MEDICATIONS USED:   30 cc of 1/4% marcaine  SPECIMEN:   Gall bladder  COUNTS CORRECT:  YES  INDICATIONS FOR PROCEDURE:  Glenda [name] is a 67 y.o. (DOB: [birth year]) white female whose primary care physician is Marletta Lor, MD and comes for cholecystectomy.   The indications and risks of the gall bladder surgery were explained to the patient.  The risks include, but are not limited to, infection, bleeding, common bile duct injury and open surgery.  SURGERY:  The patient was taken to OR room #8 at [facility].  The abdomen was prepped with chloroprep.  The patient was given 2 gm Ancef at the beginning of the operation.   A time out was held and the surgical checklist run.   An infraumbilical incision was made into the abdominal cavity.  A 12 mm Hasson trocar was inserted into the abdominal cavity through the infraumbilical incision and secured with a 0 Vicryl suture.  Four additional trocars were inserted: a 10 mm trocar in the sub-xiphoid location, a 5 mm trocar in the right mid subcostal area, a 5 mm trocar midway between the xiphoid and umbilicus, and a 5 mm trocar in the right lateral subcostal area.   The abdomen was explored and the liver, stomach, and bowel that could be seen were unremarkable.   The gall bladder was encased in omentum and the duodenum was pulled up over the gall bladder.  The gall bladder was contracted and  severely chronically inflamed and looked like is was trying to fistulize to the duodenum.  I saw no obvious injury to the duodenum or stomach.  I spent about 45 minutes just trying to identify the gall bladder. All this inflammation was chronic, there was no acute inflammation.   I grasped the gall bladder and rotated it cephalad.  But because of the severe inflammation, I could not dissect out a cystic duct.  So I took the gall bladder down "top down" technique.   I dissected to where the gall bladder narrowed (forming a cystic duct) and found the node of Calot.  I tried to shoot a cholagniogram through the open gall bldder.  I used a taut catheter, passed it into the cystic duct, and tried to secure it with an endoloop around the cystic duct.  But the endoloop did not hold the taut catheter and the cholangiogram showed extravasation and was not beneficial.   I thought I had safelty dissected down to the cystic duct.  The distal gall bladder/cystic duct was thick and I did not think would hold a clip.  So I used a 45 mm blue load of the Ethicon endo stapler and fired this across the distal gall bladder/cystic duct .  I thought I was about 2 cm from the common bile duct.     After the gall bladder was removed from the liver, the gall bladder bed and Triangle of Calot  were inspected.  There was no bleeding or bile leak.  The gall bladder was placed in a Ecco Sac bag and delivered through the umbilicus.  The abdomen was irrigated with 2,000 cc saline.   The trocars were then removed.  I infiltrated 30cc of 1/4% Marcaine into the incisions.  The umbilical port closed with a 0 Vicryl suture and the skin closed with 4-0 Monocryl.  The skin was painted with DermaBond.     I have a surgeon as a first assist to retract, expose, and assist on this difficult operation.   The patient's sponge and needle count were correct.  The patient was transported to the RR in good condition.  Alphonsa Overall, MD, [facility] Pager: [phone] Office phone:  [phone]

## 2018-01-11 NOTE — Transfer of Care (Signed)
Immediate Anesthesia Transfer of Care Note  Patient: Glenda [name]  Procedure(s) Performed: LAPAROSCOPIC CHOLECYSTECTOMY WITH INTRAOPERATIVE CHOLANGIOGRAM ERAS PATHWAY (N/A Abdomen)  Patient Location: PACU  Anesthesia Type:General  Level of Consciousness: sedated and drowsy  Airway & Oxygen Therapy: Patient Spontanous Breathing and Patient connected to face mask oxygen  Post-op Assessment: Report given to RN and Post -op Vital signs reviewed and stable  Post vital signs: Reviewed and stable  Last Vitals:  Vitals Value Taken Time  BP    Temp    Pulse 70 01/11/2018  9:36 AM  Resp 19 01/11/2018  9:36 AM  SpO2 91 % 01/11/2018  9:36 AM  Vitals shown include unvalidated device data.  Last Pain:  Vitals:   01/11/18 0551  TempSrc: Oral  PainSc: 0-No pain      Patients Stated Pain Goal: 2 (01/41/59 7331)  Complications: No apparent anesthesia complications

## 2018-01-11 NOTE — Anesthesia Postprocedure Evaluation (Signed)
Anesthesia Post Note  Patient: Glenda [name]  Procedure(s) Performed: LAPAROSCOPIC CHOLECYSTECTOMY WITH INTRAOPERATIVE CHOLANGIOGRAM ERAS PATHWAY (N/A Abdomen)     Patient location during evaluation: PACU Anesthesia Type: General Level of consciousness: awake and sedated Pain management: pain level controlled Vital Signs Assessment: post-procedure vital signs reviewed and stable Respiratory status: spontaneous breathing Cardiovascular status: stable Postop Assessment: no apparent nausea or vomiting Anesthetic complications: no    Last Vitals:  Vitals:   01/11/18 0935 01/11/18 0945  BP: (!) (P) 127/51 124/73  Pulse: 69 77  Resp: 20 20  Temp: 36.4 C   SpO2:  95%    Last Pain:  Vitals:   01/11/18 0551  TempSrc: Oral  PainSc: 0-No pain   Pain Goal: Patients Stated Pain Goal: 2 (01/11/18 0551)               Abraham Margulies JR,JOHN Mateo Flow

## 2018-01-11 NOTE — Progress Notes (Signed)
Care of pt assumed by MA Haeli Gerlich RN 

## 2018-01-11 NOTE — Addendum Note (Signed)
Addendum  created 01/11/18 1436 by Orlie Dakin, CRNA   Charge Capture section accepted

## 2018-01-12 ENCOUNTER — Encounter (HOSPITAL_COMMUNITY): Payer: Self-pay | Admitting: Surgery

## 2018-02-27 ENCOUNTER — Other Ambulatory Visit: Payer: Self-pay | Admitting: Internal Medicine

## 2018-03-11 ENCOUNTER — Ambulatory Visit (INDEPENDENT_AMBULATORY_CARE_PROVIDER_SITE_OTHER): Payer: Medicare Other

## 2018-03-11 DIAGNOSIS — Z23 Encounter for immunization: Secondary | ICD-10-CM | POA: Diagnosis not present

## 2018-06-21 ENCOUNTER — Encounter: Payer: Self-pay | Admitting: Gastroenterology

## 2018-07-18 ENCOUNTER — Encounter: Payer: Self-pay | Admitting: Family Medicine

## 2018-07-18 ENCOUNTER — Ambulatory Visit (INDEPENDENT_AMBULATORY_CARE_PROVIDER_SITE_OTHER): Payer: Medicare Other | Admitting: Family Medicine

## 2018-07-18 ENCOUNTER — Other Ambulatory Visit: Payer: Self-pay

## 2018-07-18 ENCOUNTER — Encounter: Payer: Medicare Other | Admitting: Internal Medicine

## 2018-07-18 VITALS — BP 110/78 | HR 69 | Temp 98.4°F | Resp 12 | Ht 68.0 in | Wt 189.0 lb

## 2018-07-18 DIAGNOSIS — Z1239 Encounter for other screening for malignant neoplasm of breast: Secondary | ICD-10-CM

## 2018-07-18 DIAGNOSIS — M25511 Pain in right shoulder: Secondary | ICD-10-CM | POA: Diagnosis not present

## 2018-07-18 DIAGNOSIS — Z0000 Encounter for general adult medical examination without abnormal findings: Secondary | ICD-10-CM

## 2018-07-18 DIAGNOSIS — E785 Hyperlipidemia, unspecified: Secondary | ICD-10-CM

## 2018-07-18 DIAGNOSIS — R7302 Impaired glucose tolerance (oral): Secondary | ICD-10-CM | POA: Diagnosis not present

## 2018-07-18 DIAGNOSIS — F410 Panic disorder [episodic paroxysmal anxiety] without agoraphobia: Secondary | ICD-10-CM

## 2018-07-18 DIAGNOSIS — I10 Essential (primary) hypertension: Secondary | ICD-10-CM

## 2018-07-18 DIAGNOSIS — Z780 Asymptomatic menopausal state: Secondary | ICD-10-CM

## 2018-07-18 LAB — BASIC METABOLIC PANEL
BUN: 19 mg/dL (ref 6–23)
CHLORIDE: 103 meq/L (ref 96–112)
CO2: 30 mEq/L (ref 19–32)
Calcium: 9.6 mg/dL (ref 8.4–10.5)
Creatinine, Ser: 0.64 mg/dL (ref 0.40–1.20)
GFR: 92.4 mL/min (ref >60.00)
GLUCOSE: 92 mg/dL (ref 70–99)
POTASSIUM: 5 meq/L (ref 3.5–5.1)
Sodium: 140 mEq/L (ref 135–145)

## 2018-07-18 LAB — LIPID PANEL
CHOLESTEROL: 206 mg/dL — AB (ref 0–200)
HDL: 46.3 mg/dL (ref >39.00)
LDL Cholesterol: 137 mg/dL — ABNORMAL HIGH (ref 0–99)
NonHDL: 160.05
Total CHOL/HDL Ratio: 4
Triglycerides: 116 mg/dL (ref 0.0–149.0)
VLDL: 23.2 mg/dL (ref 0.0–40.0)

## 2018-07-18 LAB — HEMOGLOBIN A1C: Hgb A1c MFr Bld: 5.3 % (ref 4.6–6.5)

## 2018-07-18 MED ORDER — METOPROLOL SUCCINATE ER 25 MG PO TB24: 25.0000 mg | ORAL_TABLET | Freq: Every day | ORAL | 1 refills | Status: DC

## 2018-07-18 NOTE — Progress Notes (Signed)
HPI:   Ms.Juliet C Favaro is a 68 y.o. female, who is here today to establish care and for her routine physical.  Last CPE: 2017  Regular exercise 3 or more time per week: Not consistently. Following a healthful diet: Yes,sje cooks most of the time. She lives with her husband.  Chronic medical problems: HTN,anxiety,tobacco use disorder,and constipations among some.  Pap smear about 15 years ago. Hx of abnormal pap smears: Denies. Hx of STD's:Denies.   M:12 G:3 L:2 She breast-fed.   Immunization History  Administered Date(s) Administered  . Influenza Split 03/13/2011, 03/18/2012  . Influenza Whole 02/15/2006, 04/19/2007  . Influenza, High Dose Seasonal PF 04/02/2016, 03/17/2017, 03/11/2018  . Influenza,inj,Quad PF,6+ Mos 03/31/2013, 04/06/2014, 03/22/2015  . Pneumococcal Conjugate-13 05/01/2016  . Pneumococcal Polysaccharide-23 06/07/2017  . Tdap 03/13/2011    Mammogram: Has not had it in many years. Having colonoscopy in a few days. Colonoscopy: 05/2013. DEXA: Never.  Hep C screening: 05/2017.  Concerns today:  Right shoulder pain,chronic,exacerbated by certain movements and alleviated by rest. No limitation of ROM. 2 months ago she felt a "popping" sensation and right sternoclavicular pain. No Hx of trauma. Pain is exacerbated by right shoulder movement. No erythema. She took her mother to ortho for hip fracture, he recommended for her to see ortho (shoulder specialist).  HTN and palpitations: She is on Metoprolol Succinate 25 mg 1/2 tab daily. She is not checking BP. When she was taking the whole tab she as having lightheadedness. Denies severe/frequent headache, visual changes, chest pain, dyspnea, palpitation, claudication, focal weakness, or edema.  Anxiety: She is on Paxil 40 mg daily and Xanax 0.25 mg daily prn. She doe snot take medication frequently and frequently her prescription expires. She denies depressed mood or suicidal  thoughts.  HgA1C in 05/2017 mildly elevated at 5.7. Denies abdominal pain, nausea,vomiting, polydipsia,polyuria, or polyphagia.   Dyslipidemia: She is on non pharmacologic treatment.  Component     Latest Ref Rng & Units 06/07/2017  Cholesterol     0 - 200 mg/dL 164  Triglycerides     0.0 - 149.0 mg/dL 103.0  HDL Cholesterol     >39.00 mg/dL 16.20 (L)  VLDL     0.0 - 40.0 mg/dL 20.6  LDL (calc)     0 - 99 mg/dL 127 (H)  Total CHOL/HDL Ratio      10  NonHDL      147.62    Review of Systems  Constitutional: Negative for appetite change, fatigue and fever.  HENT: Negative for dental problem, hearing loss, mouth sores, sore throat, trouble swallowing and voice change.   Eyes: Negative for redness and visual disturbance.  Respiratory: Negative for cough, shortness of breath and wheezing.   Cardiovascular: Negative for chest pain and leg swelling. + Palpitations. Gastrointestinal: Negative for abdominal pain, nausea and vomiting.       No changes in bowel habits.  Endocrine: Negative for cold intolerance, heat intolerance, polydipsia, polyphagia and polyuria.  Genitourinary: Negative for decreased urine volume, dysuria, hematuria, vaginal bleeding and vaginal discharge.  Musculoskeletal: + Arthralgias, negative for gait problem and myalgias.  Skin: Negative for color change and rash.  Allergic/Immunologic: Negative for environmental allergies.  Neurological: Negative for syncope, weakness and headaches.  Hematological: Negative for adenopathy. Does not bruise/bleed easily.  Psychiatric/Behavioral: Negative for confusion and sleep disturbance. The patient is nervous/anxious.   All other systems reviewed and are negative.   Current Outpatient Medications on File Prior to Visit  Medication Sig Dispense Refill  . ALPRAZolam (XANAX) 0.25 MG tablet Take 1 tablet (0.25 mg total) by mouth 2 (two) times daily as needed for anxiety. 60 tablet 0  . diphenhydrAMINE (BENADRYL) 25 mg  capsule Take 25 mg by mouth every 6 (six) hours as needed (for allergies.).    Marland Kitchen ibuprofen (ADVIL,MOTRIN) 200 MG tablet Take 600 mg by mouth every 8 (eight) hours as needed (for pain.).     Marland Kitchen metoprolol succinate (TOPROL-XL) 25 MG 24 hr tablet TAKE 1 TABLET BY MOUTH DAILY 90 tablet 1  . PARoxetine (PAXIL) 40 MG tablet TAKE 1 TABLET BY MOUTH EVERY DAY IN THE MORNING 90 tablet 1   No current facility-administered medications on file prior to visit.      Past Medical History:  Diagnosis Date  . Allergic rhinitis   . Anxiety   . History of colonic polyps   . History of PSVT (paroxysmal supraventricular tachycardia)   . Hypertension   . Impaired glucose tolerance   . Obesity   . Pre-diabetes    hx, not currently being monitor for diabetes, diet controlled  . Tobacco user   . Vertigo    started 3 weeks ago- after fluid trapped in ear    Past Surgical History:  Procedure Laterality Date  . ankle sx-left fx    . CATARACT EXTRACTION     both eyes  . CHOLECYSTECTOMY N/A 01/11/2018   Procedure: LAPAROSCOPIC CHOLECYSTECTOMY WITH INTRAOPERATIVE CHOLANGIOGRAM ERAS PATHWAY;  Surgeon: Alphonsa Overall, MD;  Location: [facility];  Service: General;  Laterality: N/A;  . COLONOSCOPY  04/23/2005  . foot sx     with the ankle surgery    Allergies  Allergen Reactions  . Amoxicillin Rash and Other (See Comments)    Has patient had a PCN reaction causing immediate rash, facial/tongue/throat swelling, SOB or lightheadedness with hypotension: No HAS PT DEVELOPED SEVERE RASH INVOLVING MUCUS MEMBRANES or SKIN NECROSIS: #  #  YES  #  #  Has patient had a PCN reaction that required hospitalization: No Has patient had a PCN reaction occurring within the last 10 years: No   . Erythromycin Other (See Comments)    Upset stomach    Family History  Problem Relation Age of Onset  . Healthy Mother   . Dementia Father   . Healthy Sister   . Healthy Sister   . Alzheimer's disease Other   . Colon cancer Neg Hx    . Esophageal cancer Neg Hx   . Rectal cancer Neg Hx   . Stomach cancer Neg Hx     Social History   Socioeconomic History  . Marital status: Married    Spouse name: Not on file  . Number of children: Not on file  . Years of education: Not on file  . Highest education level: Not on file  Occupational History  . Not on file  Social Needs  . Financial resource strain: Not on file  . Food insecurity:    Worry: Not on file    Inability: Not on file  . Transportation needs:    Medical: Not on file    Non-medical: Not on file  Tobacco Use  . Smoking status: Current Every Day Smoker    Packs/day: 0.50    Types: Cigarettes  . Smokeless tobacco: Never Used  Substance and Sexual Activity  . Alcohol use: Yes    Comment: weekends  . Drug use: No  . Sexual activity: Not on file  Lifestyle  .  Physical activity:    Days per week: Not on file    Minutes per session: Not on file  . Stress: Not on file  Relationships  . Social connections:    Talks on phone: Not on file    Gets together: Not on file    Attends religious service: Not on file    Active member of club or organization: Not on file    Attends meetings of clubs or organizations: Not on file    Relationship status: Not on file  Other Topics Concern  . Not on file  Social History Narrative  . Not on file     Vitals:   07/18/18 0917  BP: 110/78  Pulse: 69  Resp: 12  Temp: 98.4 F (36.9 C)  SpO2: 97%   Body mass index is 28.74 kg/m.   Wt Readings from Last 3 Encounters:  07/18/18 189 lb (85.7 kg)  01/11/18 175 lb 3.2 oz (79.5 kg)  01/05/18 175 lb 3.2 oz (79.5 kg)     Physical Exam  Nursing note and vitals reviewed. Constitutional: She is oriented to person, place, and time. She appears well-developed and well-nourished. No distress.  HENT:  Head: Normocephalic and atraumatic.  Right Ear: Hearing, tympanic membrane, external ear and ear canal normal.  Left Ear: Hearing, tympanic membrane, external  ear and ear canal normal.  Mouth/Throat: Uvula is midline, oropharynx is clear and moist and mucous membranes are normal.  Eyes: Pupils are equal, round, and reactive to light. Conjunctivae and EOM are normal.  Neck: No tracheal deviation present. No thyromegaly present.  Cardiovascular: Normal rate and irregular rhythm (reporting Hx).  No murmur heard. Pulses:      Dorsalis pedis pulses are 2+ on the right side, and 2+ on the left side.  Respiratory: Effort normal and breath sounds normal. No respiratory distress.  GI: Soft. She exhibits no mass. There is no hepatomegaly. There is no tenderness.  Genitourinary:Comments: Breast: No masses,skin changes,or nipple discharge bilateral.  Musculoskeletal: She exhibits no edema.Right shoulder range of motion in normal range.  Sternoclavicular joint prominence, sternoclavicular "popping"/movement and painful upon right shoulder movement no tenderness upon palpation. No major deformity or signs of synovitis appreciated. Lymphadenopathy:    She has no cervical or axillary adenopathy.       Right: No supraclavicular adenopathy present.       Left: No supraclavicular adenopathy present.  Neurological: She is alert and oriented to person, place, and time. She has normal strength. No cranial nerve deficit. Coordination and gait normal.  Reflex Scores:      Bicep reflexes are 2+ on the right side and 2+ on the left side.      Patellar reflexes are 2+ on the right side and 2+ on the left side. Skin: Skin is warm. No rash noted. No erythema.  Psychiatric: She has a normal mood and affect. Cognitive function grossly intact. Well groomed, good eye contact.     ASSESSMENT AND PLAN:  Ms. DOROTHEA YOW was here today annual physical examination.   Orders Placed This Encounter  Procedures  . DEXAScan  . Mammogram Digital Screening  . Hemoglobin A1c  . Basic metabolic panel  . Lipid panel  . Ambulatory referral to Orthopedic Surgery    Lab Results   Component Value Date   CHOL 206 (H) 07/18/2018   HDL 46.30 07/18/2018   LDLCALC 137 (H) 07/18/2018   LDLDIRECT 170.5 06/06/2010   TRIG 116.0 07/18/2018   CHOLHDL 4 07/18/2018  Lab Results  Component Value Date   CREATININE 0.64 07/18/2018   BUN 19 07/18/2018   NA 140 07/18/2018   K 5.0 07/18/2018   CL 103 07/18/2018   CO2 30 07/18/2018   Lab Results  Component Value Date   HGBA1C 5.3 07/18/2018     Routine general medical examination at a health care facility We discussed the importance of regular physical activity and healthy diet for prevention of chronic illness and/or complications. Preventive guidelines reviewed. Vaccination up to date. We will plan on pap smear next visit.  Ca++ and vit D supplementation recommended. Next CPE in a year.  The 10-year ASCVD risk score Mikey Bussing DC Brooke [place]., et al., 2013) is: 12.7%   Values used to calculate the score:     Age: 36 years     Sex: Female     Is Non-Hispanic African American: No     Diabetic: No     Tobacco smoker: Yes     Systolic Blood Pressure: 096 mmHg     Is BP treated: Yes     HDL Cholesterol: 46.3 mg/dL     Total Cholesterol: 206 mg/dL  Impaired glucose tolerance Encouraged following a healthy life style for primary prevention.  -     Hemoglobin A1c  Essential hypertension Adequately controlled. No changes in current management. Continue low salt diet. Eye exam recommended annually.  -     Basic metabolic panel  Breast cancer screening -     Mammogram Digital Screening; Future  Asymptomatic postmenopausal estrogen deficiency -     DEXAScan; Future  Dyslipidemia (high LDL; low HDL) Continue non pharmacologic treatment. Further recommendations will be given according to lipid panel results and 10 years CVD risk.  -     Lipid panel  Pain of right sternoclavicular joint ? OA. Ortho referral placed.  -     Ambulatory referral to Orthopedic Surgery  PANIC DISORDER Stable.  No changes in current  management. F/U in 6-12 months. She does not need refills today,she will let me know when she does.   Return in 6 months (on 01/18/2019) for pap smear and f/u.    Bonne Whack G. Martinique, MD  [facility]. [place] office.

## 2018-07-18 NOTE — Patient Instructions (Addendum)
A few things to remember from today's visit:   Impaired glucose tolerance - Plan: Hemoglobin A1c  Essential hypertension - Plan: Basic metabolic panel  PANIC DISORDER  Breast cancer screening - Plan: Mammogram Digital Screening  Asymptomatic postmenopausal estrogen deficiency - Plan: DEXAScan  Dyslipidemia (high LDL; low HDL) - Plan: Lipid panel  Routine general medical examination at a health care facility  A few tips:  -As we age balance is not as good as it was, so there is a higher risks for falls. Please remove small rugs and furniture that is "in your way" and could increase the risk of falls. Stretching exercises may help with fall prevention: Yoga and Tai Chi are some examples. Low impact exercise is better, so you are not very achy the next day.  -Sun screen and avoidance of direct sun light recommended. Caution with dehydration, if working outdoors be sure to drink enough fluids.  - Some medications are not safe as we age, increases the risk of side effects and can potentially interact with other medication you are also taken;  including some of over the counter medications. Be sure to let me know when you start a new medication even if it is a dietary/vitamin supplement.   -Healthy diet low in red meet/animal fat and sugar + regular physical activity is recommended.      Please be sure medication list is accurate. If a new problem present, please set up appointment sooner than planned today.

## 2018-07-19 ENCOUNTER — Ambulatory Visit (INDEPENDENT_AMBULATORY_CARE_PROVIDER_SITE_OTHER): Payer: Medicare Other | Admitting: Orthopaedic Surgery

## 2018-07-19 ENCOUNTER — Encounter (INDEPENDENT_AMBULATORY_CARE_PROVIDER_SITE_OTHER): Payer: Self-pay | Admitting: Orthopaedic Surgery

## 2018-07-19 ENCOUNTER — Ambulatory Visit (INDEPENDENT_AMBULATORY_CARE_PROVIDER_SITE_OTHER): Payer: Self-pay

## 2018-07-19 DIAGNOSIS — M19011 Primary osteoarthritis, right shoulder: Secondary | ICD-10-CM

## 2018-07-19 MED ORDER — DICLOFENAC SODIUM 1 % TD GEL: 2.0000 g | Freq: Four times a day (QID) | TRANSDERMAL | 5 refills | Status: DC

## 2018-07-19 NOTE — Progress Notes (Signed)
Office Visit Note   Patient: Glenda [name]           Date of Birth: 17-Mar-1951           MRN: [id number] Visit Date: 07/19/2018              Requested by: Martinique, Betty G, MD [address] [place], [place] [address] PCP: Martinique, Betty G, MD   Assessment & Plan: Visit Diagnoses:  1. Arthritis of right sternoclavicular joint     Plan: Impression is right [place] joint arthrosis.  She mainly wants reassurance.  She does not have any dysphasia or shortness of breath.  Does not sound like is bad enough to warrant an injection today.  We will give her a prescription for Voltaren gel to try.  Otherwise we will see her back as needed.  Follow-Up Instructions: Return if symptoms worsen or fail to improve.   Orders:  Orders Placed This Encounter  Procedures  . XR Shoulder Right   Meds ordered this encounter  Medications  . diclofenac sodium (VOLTAREN) 1 % GEL    Sig: Apply 2 g topically 4 (four) times daily.    Dispense:  1 Tube    Refill:  5      Procedures: No procedures performed   Clinical Data: No additional findings.   Subjective: Chief Complaint  Patient presents with  . Right Shoulder - Pain    C/o knot area over [employer] joint, pops and clicks.  Onset x 6-8 months, no injury.    [name] is a 68 year old female who comes in with 6 to 8 months of right [place] joint swelling and enlargement that will pop and click with shoulder movement.  She denies any injuries that she is aware of.  She denies any dysphasia or shortness of breath or any constitutional symptoms.  Denies any autoimmune disorders.  She has been taking over-the-counter NSAIDs as needed.  Denies any numbness and tingling.   Review of Systems  Constitutional: Negative.   HENT: Negative.   Eyes: Negative.   Respiratory: Negative.   Cardiovascular: Negative.   Endocrine: Negative.   Musculoskeletal: Negative.   Neurological: Negative.   Hematological: Negative.   Psychiatric/Behavioral: Negative.   All  other systems reviewed and are negative.    Objective: Vital Signs: There were no vitals taken for this visit.  Physical Exam Vitals signs and nursing note reviewed.  Constitutional:      Appearance: She is well-developed.  HENT:     Head: Normocephalic and atraumatic.  Neck:     Musculoskeletal: Neck supple.  Pulmonary:     Effort: Pulmonary effort is normal.  Abdominal:     Palpations: Abdomen is soft.  Skin:    General: Skin is warm.     Capillary Refill: Capillary refill takes less than 2 seconds.  Neurological:     Mental Status: She is alert and oriented to person, place, and time.  Psychiatric:        Behavior: Behavior normal.        Thought Content: Thought content normal.        Judgment: Judgment normal.     Ortho Exam Right [place] joint shows moderate enlargement with some mild crepitus with shoulder range of motion.  She has no problems with swallowing or speaking.  This is not overly tender to palpation.  The [place] joint is grossly stable.  No evidence of infection. Specialty Comments:  No specialty comments available.  Imaging: Xr Shoulder Right  Result Date: 07/19/2018 No acute or structural abnormalities    PMFS History: Patient Active Problem List   Diagnosis Date Noted  . Impaired glucose tolerance 03/13/2011  . ANXIETY 11/25/2007  . ALLERGIC RHINITIS 11/25/2007  . History of colonic polyps 11/25/2007  . ONYCHOMYCOSIS, TOENAILS 12/15/2006  . Obesity 11/24/2006  . PANIC DISORDER 11/24/2006  . Essential hypertension 11/24/2006  . PAROXYSMAL SUPRAVENTRICULAR TACHYCARDIA 11/24/2006   Past Medical History:  Diagnosis Date  . Allergic rhinitis   . Anxiety   . History of colonic polyps   . History of PSVT (paroxysmal supraventricular tachycardia)   . Hypertension   . Impaired glucose tolerance   . Obesity   . Pre-diabetes    hx, not currently being monitor for diabetes, diet controlled  . Tobacco user   . Vertigo    started 3 weeks ago- after  fluid trapped in ear    Family History  Problem Relation Age of Onset  . Healthy Mother   . Dementia Father   . Healthy Sister   . Healthy Sister   . Alzheimer's disease Other   . Colon cancer Neg Hx   . Esophageal cancer Neg Hx   . Rectal cancer Neg Hx   . Stomach cancer Neg Hx     Past Surgical History:  Procedure Laterality Date  . ankle sx-left fx    . CATARACT EXTRACTION     both eyes  . CHOLECYSTECTOMY N/A 01/11/2018   Procedure: LAPAROSCOPIC CHOLECYSTECTOMY WITH INTRAOPERATIVE CHOLANGIOGRAM ERAS PATHWAY;  Surgeon: Alphonsa Overall, MD;  Location: [facility];  Service: General;  Laterality: N/A;  . COLONOSCOPY  04/23/2005  . foot sx     with the ankle surgery   Social History   Occupational History  . Not on file  Tobacco Use  . Smoking status: Current Every Day Smoker    Packs/day: 0.50    Types: Cigarettes  . Smokeless tobacco: Never Used  Substance and Sexual Activity  . Alcohol use: Yes    Comment: weekends  . Drug use: No  . Sexual activity: Not on file

## 2018-07-25 ENCOUNTER — Other Ambulatory Visit: Payer: Self-pay

## 2018-07-25 ENCOUNTER — Encounter: Payer: Self-pay | Admitting: Gastroenterology

## 2018-07-25 ENCOUNTER — Ambulatory Visit (AMBULATORY_SURGERY_CENTER): Payer: Self-pay | Admitting: *Deleted

## 2018-07-25 VITALS — Ht 67.5 in | Wt 186.0 lb

## 2018-07-25 DIAGNOSIS — Z86010 Personal history of colonic polyps: Secondary | ICD-10-CM

## 2018-07-25 MED ORDER — NA SULFATE-K SULFATE-MG SULF 17.5-3.13-1.6 GM/177ML PO SOLN: ORAL | 0 refills | Status: DC

## 2018-07-25 NOTE — Progress Notes (Signed)
No egg or soy allergy  No home oxygen used or hx of sleep apnea  No diet medications taken  No anesthesia or intubation problems per pt  Registered in [facility]

## 2018-08-08 ENCOUNTER — Encounter: Payer: Medicare Other | Admitting: Gastroenterology

## 2018-09-07 ENCOUNTER — Other Ambulatory Visit: Payer: Self-pay | Admitting: *Deleted

## 2018-09-07 ENCOUNTER — Other Ambulatory Visit: Payer: Self-pay | Admitting: Family Medicine

## 2018-09-07 MED ORDER — PAROXETINE HCL 40 MG PO TABS: ORAL_TABLET | ORAL | 1 refills | Status: DC

## 2018-09-07 NOTE — Progress Notes (Signed)
Requested Prescriptions  Pending Prescriptions Disp Refills  . PARoxetine (PAXIL) 40 MG tablet 90 tablet 1     There is no refill protocol information for this order

## 2018-09-07 NOTE — Progress Notes (Signed)
It seem like Rx was already sent. BJ

## 2018-09-07 NOTE — Progress Notes (Signed)
Message sent to Dr. Jordan for review and approval. 

## 2018-09-09 NOTE — Progress Notes (Signed)
Noted  

## 2018-11-17 ENCOUNTER — Encounter: Payer: Self-pay | Admitting: Family Medicine

## 2019-01-18 ENCOUNTER — Ambulatory Visit: Payer: Medicare Other | Admitting: Family Medicine

## 2019-01-27 ENCOUNTER — Encounter: Payer: Self-pay | Admitting: Family Medicine

## 2019-02-11 ENCOUNTER — Ambulatory Visit (INDEPENDENT_AMBULATORY_CARE_PROVIDER_SITE_OTHER): Payer: Medicare Other

## 2019-02-11 DIAGNOSIS — Z23 Encounter for immunization: Secondary | ICD-10-CM

## 2019-03-09 ENCOUNTER — Other Ambulatory Visit: Payer: Self-pay | Admitting: Family Medicine

## 2019-03-10 NOTE — Telephone Encounter (Signed)
Patient need to schedule an ov for more refills. 

## 2019-03-11 ENCOUNTER — Other Ambulatory Visit: Payer: Self-pay | Admitting: Family Medicine

## 2019-03-13 NOTE — Telephone Encounter (Signed)
Okay for 30 day refill?   Pt needs an ov. Pt last appt. was 06/2018 for CPE

## 2019-04-17 ENCOUNTER — Encounter: Payer: Self-pay | Admitting: Gastroenterology

## 2019-06-06 ENCOUNTER — Other Ambulatory Visit: Payer: Self-pay | Admitting: Family Medicine

## 2019-06-07 ENCOUNTER — Other Ambulatory Visit: Payer: Self-pay

## 2019-06-07 NOTE — Telephone Encounter (Signed)
Medication Requested: Alprazolam 0.25 mg tablet Last Refill: 01/03/2018 Last Office Visit: 07/18/2018 Next Office Visit: Not scheduled Med Contract: Not on file

## 2019-06-09 MED ORDER — ALPRAZOLAM 0.25 MG PO TABS: 0.2500 mg | ORAL_TABLET | Freq: Every day | ORAL | 0 refills | Status: DC | PRN

## 2019-06-15 ENCOUNTER — Ambulatory Visit: Payer: Medicare Other

## 2019-06-23 ENCOUNTER — Ambulatory Visit: Payer: Medicare PPO | Attending: Internal Medicine

## 2019-06-23 DIAGNOSIS — Z23 Encounter for immunization: Secondary | ICD-10-CM

## 2019-06-23 NOTE — Progress Notes (Signed)
   Covid-19 Vaccination Clinic  Name:  CARETTA TWEDT    MRN: [id number] DOB: 08-13-1950  06/23/2019  Ms. Munsen was observed post Covid-19 immunization for 15 minutes without incidence. She was provided with Vaccine Information Sheet and instruction to access the V-Safe system.   Ms. Reiten was instructed to call 911 with any severe reactions post vaccine: Marland Kitchen Difficulty breathing  . Swelling of your face and throat  . A fast heartbeat  . A bad rash all over your body  . Dizziness and weakness    Immunizations Administered    Name Date Dose VIS Date Route   Pfizer COVID-19 Vaccine 06/23/2019  4:32 PM 0.3 mL 04/28/2019 Intramuscular   Manufacturer: [place]   Lot: [id number]   [place]: [id number]

## 2019-07-06 ENCOUNTER — Ambulatory Visit: Payer: Medicare Other

## 2019-07-19 ENCOUNTER — Ambulatory Visit: Payer: Medicare PPO | Attending: Internal Medicine

## 2019-07-19 DIAGNOSIS — Z23 Encounter for immunization: Secondary | ICD-10-CM

## 2019-07-19 NOTE — Progress Notes (Signed)
   Covid-19 Vaccination Clinic  Name:  Glenda [name]    MRN: [id number] DOB: May 20, 1950  07/19/2019  [name] was observed post Covid-19 immunization for 15 minutes without incident. She was provided with Vaccine Information Sheet and instruction to access the V-Safe system.   [name] was instructed to call 911 with any severe reactions post vaccine: Marland Kitchen Difficulty breathing  . Swelling of face and throat  . A fast heartbeat  . A bad rash all over body  . Dizziness and weakness   Immunizations Administered    Name Date Dose VIS Date Route   Pfizer COVID-19 Vaccine 07/19/2019  8:23 AM 0.3 mL 04/28/2019 Intramuscular   Manufacturer: [place]   Lot: [id number]   [place]: [id number]

## 2019-08-28 ENCOUNTER — Other Ambulatory Visit: Payer: Self-pay | Admitting: Family Medicine

## 2019-10-09 ENCOUNTER — Telehealth: Payer: Self-pay | Admitting: Family Medicine

## 2019-10-11 ENCOUNTER — Other Ambulatory Visit: Payer: Self-pay | Admitting: *Deleted

## 2019-10-11 MED ORDER — METOPROLOL SUCCINATE ER 25 MG PO TB24: 25.0000 mg | ORAL_TABLET | Freq: Every day | ORAL | 1 refills | Status: DC

## 2019-10-11 NOTE — Telephone Encounter (Signed)
Pt sent in a my-chart message stating "I have req a refill on Metoprolol. I will be unavailable for an appointment until July because I am traveling to [facility] in June. My 69 yr old grandson is having open heart surgery and I will be taking care of his brothers in [place], [place] and when we return home. I will run out of medication in about 10 days."   I have scheduled the pt for a Medicare Wellness Visit for July 9th at 9:30 am. Pt would like to know if this can be refilled until she is seen.   Medication Refill: Metoprolol [facility]acy: [facility] FAX: [phone]

## 2019-10-11 NOTE — Telephone Encounter (Signed)
Rx sent to pharmacy as requested.

## 2019-11-02 ENCOUNTER — Encounter: Payer: Self-pay | Admitting: Family Medicine

## 2019-11-18 ENCOUNTER — Other Ambulatory Visit: Payer: Self-pay | Admitting: Family Medicine

## 2019-11-24 ENCOUNTER — Encounter: Payer: Self-pay | Admitting: Family Medicine

## 2019-11-24 ENCOUNTER — Other Ambulatory Visit: Payer: Self-pay

## 2019-11-24 ENCOUNTER — Ambulatory Visit (INDEPENDENT_AMBULATORY_CARE_PROVIDER_SITE_OTHER): Payer: Medicare PPO | Admitting: Family Medicine

## 2019-11-24 VITALS — BP 136/80 | HR 80 | Temp 97.8°F | Resp 12 | Ht 67.5 in | Wt 211.4 lb

## 2019-11-24 DIAGNOSIS — I471 Supraventricular tachycardia, unspecified: Secondary | ICD-10-CM

## 2019-11-24 DIAGNOSIS — F419 Anxiety disorder, unspecified: Secondary | ICD-10-CM | POA: Diagnosis not present

## 2019-11-24 DIAGNOSIS — Z0000 Encounter for general adult medical examination without abnormal findings: Secondary | ICD-10-CM

## 2019-11-24 DIAGNOSIS — Z780 Asymptomatic menopausal state: Secondary | ICD-10-CM

## 2019-11-24 DIAGNOSIS — E6609 Other obesity due to excess calories: Secondary | ICD-10-CM

## 2019-11-24 DIAGNOSIS — Z6832 Body mass index (BMI) 32.0-32.9, adult: Secondary | ICD-10-CM

## 2019-11-24 DIAGNOSIS — E785 Hyperlipidemia, unspecified: Secondary | ICD-10-CM

## 2019-11-24 DIAGNOSIS — E66811 Obesity, class 1: Secondary | ICD-10-CM

## 2019-11-24 DIAGNOSIS — Z1231 Encounter for screening mammogram for malignant neoplasm of breast: Secondary | ICD-10-CM | POA: Diagnosis not present

## 2019-11-24 DIAGNOSIS — Z1211 Encounter for screening for malignant neoplasm of colon: Secondary | ICD-10-CM

## 2019-11-24 DIAGNOSIS — L821 Other seborrheic keratosis: Secondary | ICD-10-CM | POA: Diagnosis not present

## 2019-11-24 DIAGNOSIS — R635 Abnormal weight gain: Secondary | ICD-10-CM | POA: Diagnosis not present

## 2019-11-24 NOTE — Patient Instructions (Addendum)
  Ms. Mesa , Thank you for taking time to come for your Medicare Wellness Visit. I appreciate your ongoing commitment to your health goals. Please review the following plan we discussed and let me know if I can assist you in the future.   These are the goals we discussed: Goals   None     This is a list of the screening recommended for you and due dates:  Health Maintenance  Topic Date Due  . Mammogram  Never done  . DEXA scan (bone density measurement)  Never done  . Colon Cancer Screening  06/15/2018  . Flu Shot  12/17/2019  . Tetanus Vaccine  03/12/2021  . COVID-19 Vaccine  Completed  .  Hepatitis C: One time screening is recommended by Center for Disease Control  (CDC) for  adults born from [birth year] through 1965.   Completed  . Pneumonia vaccines  Completed   A few tips:  -As we age balance is not as good as it was, so there is a higher risks for falls. Please remove small rugs and furniture that is "in your way" and could increase the risk of falls. Stretching exercises may help with fall prevention: Yoga and Tai Chi are some examples. Low impact exercise is better, so you are not very achy the next day.  -Sun screen and avoidance of direct sun light recommended. Caution with dehydration, if working outdoors be sure to drink enough fluids.  - Some medications are not safe as we age, increases the risk of side effects and can potentially interact with other medication you are also taken;  including some of over the counter medications. Be sure to let me know when you start a new medication even if it is a dietary/vitamin supplement.   -Healthy diet low in red meet/animal fat and sugar + regular physical activity is recommended.  A few things to remember from today's visit:   Asymptomatic postmenopausal estrogen deficiency - Plan: DEXAScan  Medicare annual wellness visit, subsequent  Hyperlipidemia, unspecified hyperlipidemia type - Plan: Comprehensive metabolic panel, Lipid  panel, TSH  Encounter for screening mammogram for malignant neoplasm of breast - Plan: Mammogram Digital Screening  Weight gain - Plan: TSH  Routine general medical examination at a health care facility  Colon cancer screening - Plan: Ambulatory referral to Gastroenterology  Lesion on back is not suspicious. You can arrange appt to be removed.  If you need refills please call your pharmacy. Do not use My Chart to request refills or for acute issues that need immediate attention.    Please be sure medication list is accurate. If a new problem present, please set up appointment sooner than planned today.

## 2019-11-24 NOTE — Progress Notes (Signed)
HPI: [name] is a 69 y.o. female, who is here today for her routine physical and AWV. Last AWV on 06/07/17.  She lives with her husband.. Independent ADL's and IADL's. No falls in the past year and denies depression symptoms.  Functional Status Survey: Is the patient deaf or have difficulty hearing?: No Does the patient have difficulty seeing, even when wearing glasses/contacts?: No Does the patient have difficulty concentrating, remembering, or making decisions?: No Does the patient have difficulty walking or climbing stairs?: No Does the patient have difficulty dressing or bathing?: No Does the patient have difficulty doing errands alone such as visiting a doctor's office or shopping?: No  Fall Risk  11/24/2019 05/01/2016 03/31/2013 03/31/2013  Falls in the past year? 0 No No No  Number falls in past yr: 0 - - -  Injury with Fall? 0 - - -  Follow up Education provided - - -   Providers she sees regularly: Ortho: Dr Glenda [name], prn. Eye care provider: She does not have one.  Depression screen [facility] 2/9 11/24/2019  Decreased Interest 0  Down, Depressed, Hopeless 0  PHQ - 2 Score 0     Mini-Cog - 11/24/19 1033    How many words correct? 3            Hearing Screening   _0  _1  _2  _3  _4  _5  _6  _7  _8   Right ear:           Left ear:             Visual Acuity Screening   Right eye Left eye Both eyes  Without correction: _9  With correction:       Last CPE: 07/18/18.  Regular exercise 3 or more time per week: She does not exercise regularly but she is active  Following a healthy diet: She has done so for the past few months. She gained about 20 Lb sin e her last visit and mainly at the beginning of the [place] 61 pandemia. She ahs maintained her wt stable for the past 4-5 months.  She enjoys painting.  Chronic medical problems: Allergies,anxiety,PSVTHLD,and IFG.  Immunization History  Administered Date(s) Administered   . Influenza Split 03/13/2011, 03/18/2012  . Influenza Whole 02/15/2006, 04/19/2007  . Influenza, High Dose Seasonal PF 04/02/2016, 03/17/2017, 03/11/2018  . Influenza,inj,Quad PF,6+ Mos 03/31/2013, 04/06/2014, 03/22/2015, 02/11/2019  . PFIZER SARS-COV-2 Vaccination 06/23/2019, 07/19/2019  . Pneumococcal Conjugate-13 05/01/2016  . Pneumococcal Polysaccharide-23 06/07/2017  . Tdap 03/13/2011    Mammogram: A few years ago. Colonoscopy: 06/15/2013, 5 years f/u was recommended. DEXA: Never.  Hep C screening: 06/07/17 NR.  SVT: She is on Metoprolol succinate 25 mg daily. Negative for CP,palpitations,or syncope. Anxiety: She is on Paxil 40 mg daily and Alprazolam 0.25 mg daily as needed. She takes the later one seldom.  HLD: She is on non pharmacologic treatment. + Smoker,4-5 cig/day  Component     Latest Ref Rng & Units 07/18/2018  Cholesterol     <200 mg/dL 206 (H)  Triglycerides     <150 mg/dL 116.0  HDL Cholesterol     > OR = 50 mg/dL 46.30  VLDL     0.0 - 40.0 mg/dL 23.2  LDL (calc)     0 - 99 mg/dL 137 (H)  Total CHOL/HDL Ratio     <5.0 (calc) 4  NonHDL      160.05   Concerned about lesion on back, irritated by bra strap. It is not  tender or pruritic. She does not think lesion has grown.  Review of Systems  Constitutional: Negative for appetite change, fatigue and fever.  HENT: Negative for dental problem, hearing loss, mouth sores and sore throat.   Eyes: Negative for redness and visual disturbance.  Respiratory: Negative for cough, shortness of breath and wheezing.   Cardiovascular: Negative for leg swelling.  Gastrointestinal: Negative for abdominal pain, nausea and vomiting.       No changes in bowel habits.  Endocrine: Negative for cold intolerance, heat intolerance, polydipsia, polyphagia and polyuria.  Genitourinary: Negative for decreased urine volume, dysuria, hematuria, vaginal bleeding and vaginal discharge.  Musculoskeletal: Positive for arthralgias.  Negative for gait problem and myalgias.  Skin: Negative for color change and rash.  Allergic/Immunologic: Positive for environmental allergies.  Neurological: Negative for weakness and headaches.  Hematological: Negative for adenopathy. Does not bruise/bleed easily.  Psychiatric/Behavioral: Negative for confusion and hallucinations.  All other systems reviewed and are negative.   Current Outpatient Medications on File Prior to Visit  Medication Sig Dispense Refill  . ALPRAZolam (XANAX) 0.25 MG tablet Take 1 tablet (0.25 mg total) by mouth daily as needed for anxiety. 30 tablet 0  . aspirin EC 81 MG tablet Take 81 mg by mouth daily.    . diphenhydrAMINE (BENADRYL) 25 mg capsule Take 25 mg by mouth every 6 (six) hours as needed (for allergies.).    Marland Kitchen ibuprofen (ADVIL,MOTRIN) 200 MG tablet Take 600 mg by mouth every 8 (eight) hours as needed (for pain.).     Marland Kitchen metoprolol succinate (TOPROL-XL) 25 MG 24 hr tablet Take 1 tablet (25 mg total) by mouth daily. 90 tablet 1  . Na Sulfate-K Sulfate-Mg Sulf (SUPREP BOWEL PREP KIT) 17.5-3.13-1.6 GM/177ML SOLN Suprep as directed, no substitutions 354 mL 0  . PARoxetine (PAXIL) 40 MG tablet TAKE 1 TABLET BY MOUTH EVERY DAY IN THE MORNING 90 tablet 0   No current facility-administered medications on file prior to visit.   Past Medical History:  Diagnosis Date  . Allergic rhinitis   . Allergy   . Anxiety   . Arthritis   . Cataract   . Heart murmur   . History of colonic polyps   . History of PSVT (paroxysmal supraventricular tachycardia)   . Hypertension   . Impaired glucose tolerance   . Obesity   . PAT (paroxysmal atrial tachycardia) ([place])   . Pre-diabetes    hx, not currently being monitor for diabetes, diet controlled  . Tobacco user   . Vertigo    started 3 weeks ago- after fluid trapped in ear    Past Surgical History:  Procedure Laterality Date  . ankle sx-left fx    . CATARACT EXTRACTION     both eyes  . CHOLECYSTECTOMY N/A  01/11/2018   Procedure: LAPAROSCOPIC CHOLECYSTECTOMY WITH INTRAOPERATIVE CHOLANGIOGRAM ERAS PATHWAY;  Surgeon: Alphonsa Overall, MD;  Location: [facility];  Service: General;  Laterality: N/A;  . COLONOSCOPY  04/23/2005  . foot sx     with the ankle surgery    Allergies  Allergen Reactions  . Amoxicillin Rash and Other (See Comments)    Has patient had a PCN reaction causing immediate rash, facial/tongue/throat swelling, SOB or lightheadedness with hypotension: No HAS PT DEVELOPED SEVERE RASH INVOLVING MUCUS MEMBRANES or SKIN NECROSIS: #  #  YES  #  #  Has patient had a PCN reaction that required hospitalization: No Has patient had a PCN reaction occurring within the last 10 years: No   .  Erythromycin Other (See Comments)    Upset stomach    Family History  Problem Relation Age of Onset  . Healthy Mother   . Dementia Father   . Healthy Sister   . Healthy Sister   . Alzheimer's disease Other   . Colon cancer Neg Hx   . Esophageal cancer Neg Hx   . Rectal cancer Neg Hx   . Stomach cancer Neg Hx     Social History   Socioeconomic History  . Marital status: Married    Spouse name: Not on file  . Number of children: Not on file  . Years of education: Not on file  . Highest education level: Not on file  Occupational History  . Not on file  Tobacco Use  . Smoking status: Current Every Day Smoker    Packs/day: 0.50    Types: Cigarettes  . Smokeless tobacco: Never Used  Vaping Use  . Vaping Use: Never used  Substance and Sexual Activity  . Alcohol use: Yes    Comment: weekends  . Drug use: No  . Sexual activity: Not on file  Other Topics Concern  . Not on file  Social History Narrative  . Not on file   Social Determinants of Health   Financial Resource Strain:   . Difficulty of Paying Living Expenses:   Food Insecurity:   . Worried About Charity fundraiser in the Last Year:   . Arboriculturist in the Last Year:   Transportation Needs:   . Film/video editor  (Medical):   Marland Kitchen Lack of Transportation (Non-Medical):   Physical Activity:   . Days of Exercise per Week:   . Minutes of Exercise per Session:   Stress:   . Feeling of Stress :   Social Connections:   . Frequency of Communication with Friends and Family:   . Frequency of Social Gatherings with Friends and Family:   . Attends Religious Services:   . Active Member of Clubs or Organizations:   . Attends Archivist Meetings:   Marland Kitchen Marital Status:     Vitals:   11/24/19 0922  BP: 136/80  Pulse: 80  Resp: 12  Temp: 97.8 F (36.6 C)  SpO2: 97%   Body mass index is 32.62 kg/m.   Wt Readings from Last 3 Encounters:  11/24/19 211 lb 6 oz (95.9 kg)  07/25/18 186 lb (84.4 kg)  07/18/18 189 lb (85.7 kg)   Physical Exam Vitals and nursing note reviewed.  Constitutional:      General: She is not in acute distress.    Appearance: She is well-developed.  HENT:     Head: Normocephalic and atraumatic.     Right Ear: Hearing, tympanic membrane, ear canal and external ear normal.     Left Ear: Hearing, tympanic membrane, ear canal and external ear normal.     Mouth/Throat:     Mouth: Mucous membranes are moist.     Pharynx: Oropharynx is clear. Uvula midline.  Eyes:     Conjunctiva/sclera: Conjunctivae normal.     Pupils: Pupils are equal, round, and reactive to light.  Neck:     Thyroid: No thyromegaly.     Trachea: No tracheal deviation.  Cardiovascular:     Rate and Rhythm: Normal rate and regular rhythm. Occasional extrasystoles (X1 in a minute) are present.    Pulses:          Dorsalis pedis pulses are 2+ on the right side and 2+  on the left side.     Heart sounds: No murmur heard.   Pulmonary:     Effort: Pulmonary effort is normal. No respiratory distress.     Breath sounds: Normal breath sounds.  Abdominal:     Palpations: Abdomen is soft. There is no hepatomegaly or mass.     Tenderness: There is no abdominal tenderness.  Genitourinary:    Comments: Breast:  No masses,skin changes,or nipple discharge. Musculoskeletal:     Comments: No signs of synovitis appreciated.  Lymphadenopathy:     Cervical: No cervical adenopathy.     Upper Body:     Right upper body: No supraclavicular adenopathy.     Left upper body: No supraclavicular adenopathy.  Skin:    General: Skin is warm.     Findings: Lesion present. No erythema or rash.          Comments: NO suspicious lesions. Hyperpigmented,rounded,rough surface. Lesions scattered on back.  Neurological:     Mental Status: She is alert and oriented to person, place, and time.     Cranial Nerves: No cranial nerve deficit.     Coordination: Coordination normal.     Gait: Gait normal.     Deep Tendon Reflexes:     Reflex Scores:      Bicep reflexes are 2+ on the right side and 2+ on the left side.      Patellar reflexes are 2+ on the right side and 2+ on the left side. Psychiatric:        Speech: Speech normal.     Comments: Well groomed, good eye contact.       ASSESSMENT AND PLAN:  Glenda [name] was here today annual physical examination and AWV.  Orders Placed This Encounter  Procedures  . DEXAScan  . Mammogram Digital Screening  . Comprehensive metabolic panel  . Lipid panel  . TSH  . Ambulatory referral to Gastroenterology   Lab Results  Component Value Date   CREATININE 0.78 11/24/2019   BUN 12 11/24/2019   NA 140 11/24/2019   K 4.5 11/24/2019   CL 105 11/24/2019   CO2 27 11/24/2019   Lab Results  Component Value Date   ALT 20 11/24/2019   AST 20 11/24/2019   ALKPHOS 93 06/07/2017   BILITOT 1.1 11/24/2019   Lab Results  Component Value Date   CHOL 204 (H) 11/24/2019   HDL 37 (L) 11/24/2019   LDLCALC 142 (H) 11/24/2019   LDLDIRECT 170.5 06/06/2010   TRIG 123 11/24/2019   CHOLHDL 5.5 (H) 11/24/2019   Lab Results  Component Value Date   TSH 1.47 11/24/2019    Routine general medical examination at a health care facility We discussed the importance of  regular physical activity and healthy diet for prevention of chronic illness and/or complications. Preventive guidelines reviewed. Ca++ and vit D supplementation recommended. Next CPE in a year.  The 10-year ASCVD risk score Mikey Bussing DC Brooke Bonito., et al., 2013) is: 21.2%   Values used to calculate the score:     Age: 18 years     Sex: Female     Is Non-Hispanic African American: No     Diabetic: No     Tobacco smoker: Yes     Systolic Blood Pressure: 924 mmHg     Is BP treated: Yes     HDL Cholesterol: 37 mg/dL     Total Cholesterol: 204 mg/dL  Asymptomatic postmenopausal estrogen deficiency -     DEXAScan; Future  Medic.are annual wellness visit, subsequent We discussed the importance of staying active, physically and mentally, as well as the benefits of a healthy/balance diet. Low impact exercise that involve stretching and strengthing are ideal. Vaccines up to date. We discussed preventive screening for the next 5-10 years, summery of recommendations given in AVS. Fall prevention. Eye exam q 1-2 years. Advance directives and end of life discussed, she does not have POA or living will, [employer] directive package given.   Hyperlipidemia, unspecified hyperlipidemia type Continue non pharmacologic treatment. Further recommendations according to 10 years CV risk score.  Encounter for screening mammogram for malignant neoplasm of breast -     Mammogram Digital Screening; Future  Colon cancer screening -     Ambulatory referral to Gastroenterology  Paroxysmal supraventricular tachycardia ([place]) Stable. Continue Metoprolol Succinate 25 mg daily.  Class 1 obesity due to excess calories with serious comorbidity and body mass index (BMI) of 32.0 to 32.9 in adult We discussed benefits of wt loss as well as adverse effects of obesity. Consistency with healthy diet and physical activity recommended.  Anxiety disorder, unspecified type Well controlled. No changes in current  management.  Seborrheic keratosis Reassured about malignancy.  Return in 1 year (on 11/23/2020) for cpe and awv.   Shanin Szymanowski G. Martinique, MD  [facility]. [place] office.  Asymptomatic postmenopausal estrogen deficiency - Plan: DEXAScan  Medicare annual wellness visit, subsequent  Hyperlipidemia, unspecified hyperlipidemia type - Plan: Comprehensive metabolic panel, Lipid panel, TSH  Encounter for screening mammogram for malignant neoplasm of breast - Plan: Mammogram Digital Screening  Weight gain - Plan: TSH  Routine general medical examination at a health care facility  Colon cancer screening - Plan: Ambulatory referral to Gastroenterology

## 2019-11-25 LAB — COMPREHENSIVE METABOLIC PANEL
AG Ratio: 1.5 (calc) (ref 1.0–2.5)
ALT: 20 U/L (ref 6–29)
AST: 20 U/L (ref 10–35)
Albumin: 4.1 g/dL (ref 3.6–5.1)
Alkaline phosphatase (APISO): 68 U/L (ref 37–153)
BUN: 12 mg/dL (ref 7–25)
CO2: 27 mmol/L (ref 20–32)
Calcium: 9 mg/dL (ref 8.6–10.4)
Chloride: 105 mmol/L (ref 98–110)
Creat: 0.78 mg/dL (ref 0.50–0.99)
Globulin: 2.7 g/dL (calc) (ref 1.9–3.7)
Glucose, Bld: 96 mg/dL (ref 65–99)
Potassium: 4.5 mmol/L (ref 3.5–5.3)
Sodium: 140 mmol/L (ref 135–146)
Total Bilirubin: 1.1 mg/dL (ref 0.2–1.2)
Total Protein: 6.8 g/dL (ref 6.1–8.1)

## 2019-11-25 LAB — TSH: TSH: 1.47 mIU/L (ref 0.40–4.50)

## 2019-11-25 LAB — LIPID PANEL
Cholesterol: 204 mg/dL — ABNORMAL HIGH (ref <200)
HDL: 37 mg/dL — ABNORMAL LOW (ref >=50)
LDL Cholesterol (Calc): 142 mg/dL (calc) — ABNORMAL HIGH
Non-HDL Cholesterol (Calc): 167 mg/dL (calc) — ABNORMAL HIGH (ref <130)
Total CHOL/HDL Ratio: 5.5 (calc) — ABNORMAL HIGH (ref <5.0)
Triglycerides: 123 mg/dL (ref <150)

## 2019-12-01 ENCOUNTER — Other Ambulatory Visit: Payer: Self-pay | Admitting: Family Medicine

## 2019-12-01 DIAGNOSIS — Z780 Asymptomatic menopausal state: Secondary | ICD-10-CM

## 2019-12-01 DIAGNOSIS — Z1231 Encounter for screening mammogram for malignant neoplasm of breast: Secondary | ICD-10-CM

## 2019-12-25 ENCOUNTER — Encounter: Payer: Self-pay | Admitting: Gastroenterology

## 2020-01-03 ENCOUNTER — Other Ambulatory Visit: Payer: Self-pay

## 2020-01-03 ENCOUNTER — Encounter: Payer: Self-pay | Admitting: Family Medicine

## 2020-01-03 ENCOUNTER — Ambulatory Visit: Payer: Medicare PPO | Admitting: Family Medicine

## 2020-01-03 VITALS — BP 138/78 | HR 80 | Temp 98.9°F | Resp 16 | Ht 67.5 in | Wt 209.0 lb

## 2020-01-03 DIAGNOSIS — L820 Inflamed seborrheic keratosis: Secondary | ICD-10-CM | POA: Diagnosis not present

## 2020-01-03 DIAGNOSIS — D489 Neoplasm of uncertain behavior, unspecified: Secondary | ICD-10-CM

## 2020-01-03 NOTE — Patient Instructions (Signed)
A few things to remember from today's visit:   Seborrheic keratoses, inflamed  Neoplasm of uncertain behavior  Keep area clear with soap and water. If bleeding apply pressure for 10-15 min. Monitor for signs of infection.   Please be sure medication list is accurate. If a new problem present, please set up appointment sooner than planned today.

## 2020-01-03 NOTE — Progress Notes (Signed)
Chief Complaint  Patient presents with  . mole removal   HPI: Ms.Zoei C Kisamore is a 69 y.o. female, who is here today to have lesion on back remove. She has had lesion for years, slowly growing. Frequent irritated by bra strap. Negative for tenderness unless rubbed by bra, no easy bleeding. No hx of skin cancer.  Review of Systems Rest see pertinent positives and negatives per HPI.  Current Outpatient Medications on File Prior to Visit  Medication Sig Dispense Refill  . ALPRAZolam (XANAX) 0.25 MG tablet Take 1 tablet (0.25 mg total) by mouth daily as needed for anxiety. 30 tablet 0  . aspirin EC 81 MG tablet Take 81 mg by mouth daily.    . diphenhydrAMINE (BENADRYL) 25 mg capsule Take 25 mg by mouth every 6 (six) hours as needed (for allergies.).    Marland Kitchen ibuprofen (ADVIL,MOTRIN) 200 MG tablet Take 600 mg by mouth every 8 (eight) hours as needed (for pain.).     Marland Kitchen metoprolol succinate (TOPROL-XL) 25 MG 24 hr tablet Take 1 tablet (25 mg total) by mouth daily. 90 tablet 1  . Na Sulfate-K Sulfate-Mg Sulf (SUPREP BOWEL PREP KIT) 17.5-3.13-1.6 GM/177ML SOLN Suprep as directed, no substitutions 354 mL 0  . PARoxetine (PAXIL) 40 MG tablet TAKE 1 TABLET BY MOUTH EVERY DAY IN THE MORNING 90 tablet 0   No current facility-administered medications on file prior to visit.   Past Medical History:  Diagnosis Date  . Allergic rhinitis   . Allergy   . Anxiety   . Arthritis   . Cataract   . Heart murmur   . History of colonic polyps   . History of PSVT (paroxysmal supraventricular tachycardia)   . Hypertension   . Impaired glucose tolerance   . Obesity   . PAT (paroxysmal atrial tachycardia) ([place])   . Pre-diabetes    hx, not currently being monitor for diabetes, diet controlled  . Tobacco user   . Vertigo    started 3 weeks ago- after fluid trapped in ear   Allergies  Allergen Reactions  . Amoxicillin Rash and Other (See Comments)    Has patient had a PCN reaction causing immediate  rash, facial/tongue/throat swelling, SOB or lightheadedness with hypotension: No HAS PT DEVELOPED SEVERE RASH INVOLVING MUCUS MEMBRANES or SKIN NECROSIS: #  #  YES  #  #  Has patient had a PCN reaction that required hospitalization: No Has patient had a PCN reaction occurring within the last 10 years: No   . Erythromycin Other (See Comments)    Upset stomach    Social History   Socioeconomic History  . Marital status: Married    Spouse name: Not on file  . Number of children: Not on file  . Years of education: Not on file  . Highest education level: Not on file  Occupational History  . Not on file  Tobacco Use  . Smoking status: Current Every Day Smoker    Packs/day: 0.50    Types: Cigarettes  . Smokeless tobacco: Never Used  Vaping Use  . Vaping Use: Never used  Substance and Sexual Activity  . Alcohol use: Yes    Comment: weekends  . Drug use: No  . Sexual activity: Not on file  Other Topics Concern  . Not on file  Social History Narrative  . Not on file   Social Determinants of Health   Financial Resource Strain:   . Difficulty of Paying Living Expenses: Not on file  Food Insecurity:   . Worried About Charity fundraiser in the Last Year: Not on file  . Ran Out of Food in the Last Year: Not on file  Transportation Needs:   . Lack of Transportation (Medical): Not on file  . Lack of Transportation (Non-Medical): Not on file  Physical Activity:   . Days of Exercise per Week: Not on file  . Minutes of Exercise per Session: Not on file  Stress:   . Feeling of Stress : Not on file  Social Connections:   . Frequency of Communication with Friends and Family: Not on file  . Frequency of Social Gatherings with Friends and Family: Not on file  . Attends Religious Services: Not on file  . Active Member of Clubs or Organizations: Not on file  . Attends Archivist Meetings: Not on file  . Marital Status: Not on file    Vitals:   01/03/20 0853  BP: 138/78   Pulse: 80  Resp: 16  Temp: 98.9 F (37.2 C)  SpO2: 95%   Body mass index is 32.25 kg/m.  Physical Exam Vitals and nursing note reviewed.  Constitutional:      General: She is not in acute distress.    Appearance: She is well-developed.  HENT:     Head: Normocephalic and atraumatic.  Pulmonary:     Effort: Pulmonary effort is normal. No respiratory distress.  Skin:    General: Skin is warm.     Findings: Lesion present. No erythema.          Comments: About 1.2 cm rounded ,sliglthy raised hyperpigmented lesion. Defined borders, rough surface. Other similar scattered lesion on back.  Neurological:     Mental Status: She is alert and oriented to person, place, and time.  Psychiatric:        Speech: Speech normal.     Comments: Well groomed, good eye contact.      ASSESSMENT AND PLAN:  Ms.Che[place] was seen today for mole removal.  Diagnoses and all orders for this visit:  Seborrheic keratoses, inflamed  Discussion of procedure and risk she has no questions, voices understanding and which to proceed. After written consent lesion was clean with alcohol, local anesthesia with plain Lidocaine 1 %. In sterile fashion lesion was shaved, no residual pigmentation. Hemostasis with local pressure and silver nitrate. Area covered with a band aid, no active bleeding. She tolerated procedure well, no complications. Minimal blood loss. No suspicious features, benign, so pathology was not ordered. Post procedure instructions given. She voices understanding and agrees with plan.  Return if symptoms worsen or fail to improve.   Cyndie Woodbeck G. Martinique, MD  [facility]. [place] office.  A few things to remember from today's visit:   Seborrheic keratoses, inflamed  Neoplasm of uncertain behavior  Keep area clear with soap and water. If bleeding apply pressure for 10-15 min. Monitor for signs of infection.   Please be sure medication list is accurate. If a new  problem present, please set up appointment sooner than planned today.

## 2020-01-29 ENCOUNTER — Ambulatory Visit (INDEPENDENT_AMBULATORY_CARE_PROVIDER_SITE_OTHER): Payer: Medicare PPO

## 2020-01-29 ENCOUNTER — Other Ambulatory Visit: Payer: Self-pay

## 2020-01-29 DIAGNOSIS — Z23 Encounter for immunization: Secondary | ICD-10-CM

## 2020-01-29 NOTE — Progress Notes (Signed)
Pt came into the office for her influenza injection. Injection given in the left deltoid, pt tolerated well.

## 2020-01-30 ENCOUNTER — Ambulatory Visit
Admission: RE | Admit: 2020-01-30 | Discharge: 2020-01-30 | Disposition: A | Discharge status: Home / Self Care | Payer: Medicare PPO | Source: Ambulatory Visit | Attending: Family Medicine | Admitting: Family Medicine

## 2020-01-30 DIAGNOSIS — Z1231 Encounter for screening mammogram for malignant neoplasm of breast: Secondary | ICD-10-CM

## 2020-01-30 DIAGNOSIS — M85852 Other specified disorders of bone density and structure, left thigh: Secondary | ICD-10-CM | POA: Diagnosis not present

## 2020-01-30 DIAGNOSIS — Z780 Asymptomatic menopausal state: Secondary | ICD-10-CM | POA: Diagnosis not present

## 2020-02-01 ENCOUNTER — Other Ambulatory Visit: Payer: Self-pay | Admitting: Family Medicine

## 2020-02-01 DIAGNOSIS — R928 Other abnormal and inconclusive findings on diagnostic imaging of breast: Secondary | ICD-10-CM

## 2020-02-13 ENCOUNTER — Other Ambulatory Visit: Payer: Self-pay | Admitting: Family Medicine

## 2020-02-13 ENCOUNTER — Other Ambulatory Visit: Payer: Self-pay

## 2020-02-13 ENCOUNTER — Ambulatory Visit
Admission: RE | Admit: 2020-02-13 | Discharge: 2020-02-13 | Disposition: A | Discharge status: Home / Self Care | Payer: Medicare PPO | Source: Ambulatory Visit | Attending: Family Medicine | Admitting: Family Medicine

## 2020-02-13 ENCOUNTER — Encounter: Payer: Self-pay | Admitting: Family Medicine

## 2020-02-13 DIAGNOSIS — R928 Other abnormal and inconclusive findings on diagnostic imaging of breast: Secondary | ICD-10-CM | POA: Diagnosis not present

## 2020-02-13 DIAGNOSIS — R921 Mammographic calcification found on diagnostic imaging of breast: Secondary | ICD-10-CM

## 2020-02-15 ENCOUNTER — Ambulatory Visit (AMBULATORY_SURGERY_CENTER): Payer: Self-pay | Admitting: *Deleted

## 2020-02-15 ENCOUNTER — Other Ambulatory Visit: Payer: Self-pay

## 2020-02-15 VITALS — Ht 67.5 in | Wt 211.0 lb

## 2020-02-15 DIAGNOSIS — Z86010 Personal history of colonic polyps: Secondary | ICD-10-CM

## 2020-02-15 NOTE — Progress Notes (Signed)
cov vax x 2 No egg or soy allergy known to patient  No issues with past sedation with any surgeries or procedures no intubation problems in the past  No FH of Malignant Hyperthermia No diet pills per patient No home 02 use per patient  No blood thinners per patient  Pt states  issues with constipation  OCC_ uses Fiber wafers if needed  No A fib or A flutter  EMMI video to pt or via MyChart  COVID 19 guidelines implemented in [facility] today with Pt and RN   Pt has a suprep kit at home already . Pt has a breast bx scheduled for next Wednesday, and if its +, she will have a lumpectomy- she is concerned about her colon- I explained to her that she can call and RS her Colon if its necessary to address any breast cancer if her bx if positive     Due to the COVID-19 pandemic we are asking patients to follow these guidelines. Please only bring one care partner. Please be aware that your care partner may wait in the car in the parking lot or if they feel like they will be too hot to wait in the car, they may wait in the lobby on the 4th floor. All care partners are required to wear a mask the entire time (we do not have any that we can provide them), they need to practice social distancing, and we will do a Covid check for all patient's and care partners when you arrive. Also we will check their temperature and your temperature. If the care partner waits in their car they need to stay in the parking lot the entire time and we will call them on their cell phone when the patient is ready for discharge so they can bring the car to the front of the building. Also all patient's will need to wear a mask into building.

## 2020-02-16 ENCOUNTER — Encounter: Payer: Self-pay | Admitting: Gastroenterology

## 2020-02-18 ENCOUNTER — Other Ambulatory Visit: Payer: Self-pay | Admitting: Family Medicine

## 2020-02-21 ENCOUNTER — Other Ambulatory Visit: Payer: Self-pay

## 2020-02-21 ENCOUNTER — Ambulatory Visit
Admission: RE | Admit: 2020-02-21 | Discharge: 2020-02-21 | Disposition: A | Discharge status: Home / Self Care | Payer: Medicare PPO | Source: Ambulatory Visit | Attending: Family Medicine | Admitting: Family Medicine

## 2020-02-21 DIAGNOSIS — R921 Mammographic calcification found on diagnostic imaging of breast: Secondary | ICD-10-CM

## 2020-02-21 DIAGNOSIS — N6489 Other specified disorders of breast: Secondary | ICD-10-CM | POA: Diagnosis not present

## 2020-02-29 ENCOUNTER — Encounter: Payer: Self-pay | Admitting: Gastroenterology

## 2020-02-29 ENCOUNTER — Ambulatory Visit (AMBULATORY_SURGERY_CENTER): Payer: Medicare PPO | Admitting: Gastroenterology

## 2020-02-29 ENCOUNTER — Other Ambulatory Visit: Payer: Self-pay

## 2020-02-29 VITALS — BP 114/52 | HR 65 | Temp 97.3°F | Resp 17 | Ht 67.0 in | Wt 211.0 lb

## 2020-02-29 DIAGNOSIS — D123 Benign neoplasm of transverse colon: Secondary | ICD-10-CM

## 2020-02-29 DIAGNOSIS — Z1211 Encounter for screening for malignant neoplasm of colon: Secondary | ICD-10-CM | POA: Diagnosis not present

## 2020-02-29 DIAGNOSIS — D120 Benign neoplasm of cecum: Secondary | ICD-10-CM

## 2020-02-29 DIAGNOSIS — Z86010 Personal history of colonic polyps: Secondary | ICD-10-CM

## 2020-02-29 MED ORDER — SODIUM CHLORIDE 0.9 % IV SOLN: 500.0000 mL | Freq: Once | INTRAVENOUS | Status: DC

## 2020-02-29 NOTE — Progress Notes (Signed)
Pt's states no medical or surgical changes since previsit or office visit. 

## 2020-02-29 NOTE — Progress Notes (Signed)
No problems noted in the recovery room. maw 

## 2020-02-29 NOTE — Patient Instructions (Addendum)
Handouts were give you on polyps, diverticulosis, hemorrhoids,  and a high fiber diet with liberal fluid intake. You may resume your current medications today. Await biopsy results. Please call if any questions or concerns.     YOU HAD AN ENDOSCOPIC PROCEDURE TODAY AT [facility]:   Refer to the procedure report that was given to you for any specific questions about what was found during the examination.  If the procedure report does not answer your questions, please call your gastroenterologist to clarify.  If you requested that your care partner not be given the details of your procedure findings, then the procedure report has been included in a sealed envelope for you to review at your convenience later.  YOU SHOULD EXPECT: Some feelings of bloating in the abdomen. Passage of more gas than usual.  Walking can help get rid of the air that was put into your GI tract during the procedure and reduce the bloating. If you had a lower endoscopy (such as a colonoscopy or flexible sigmoidoscopy) you may notice spotting of blood in your stool or on the toilet paper. If you underwent a bowel prep for your procedure, you may not have a normal bowel movement for a few days.  Please Note:  You might notice some irritation and congestion in your nose or some drainage.  This is from the oxygen used during your procedure.  There is no need for concern and it should clear up in a day or so.  SYMPTOMS TO REPORT IMMEDIATELY:   Following lower endoscopy (colonoscopy or flexible sigmoidoscopy):  Excessive amounts of blood in the stool  Significant tenderness or worsening of abdominal pains  Swelling of the abdomen that is new, acute  Fever of 100F or higher   For urgent or emergent issues, a gastroenterologist can be reached at any hour by calling [phone]. Do not use MyChart messaging for urgent concerns.    DIET:  We do recommend a small meal at first, but then you may proceed to  your regular diet.  Drink plenty of fluids but you should avoid alcoholic beverages for 24 hours.  ACTIVITY:  You should plan to take it easy for the rest of today and you should NOT DRIVE or use heavy machinery until tomorrow (because of the sedation medicines used during the test).    FOLLOW UP: Our staff will call the number listed on your records 48-72 hours following your procedure to check on you and address any questions or concerns that you may have regarding the information given to you following your procedure. If we do not reach you, we will leave a message.  We will attempt to reach you two times.  During this call, we will ask if you have developed any symptoms of COVID 19. If you develop any symptoms (ie: fever, flu-like symptoms, shortness of breath, cough etc.) before then, please call [phone].  If you test positive for Covid 19 in the 2 weeks post procedure, please call and report this information to [place].    If any biopsies were taken you will be contacted by phone or by letter within the next 1-3 weeks.  Please call us at [phone] if you have not heard about the biopsies in 3 weeks.    SIGNATURES/CONFIDENTIALITY: You and/or your care partner have signed paperwork which will be entered into your electronic medical record.  These signatures attest to the fact that that the information above on your After Visit Summary has  been reviewed and is understood.  Full responsibility of the confidentiality of this discharge information lies with you and/or your care-partner.

## 2020-02-29 NOTE — Progress Notes (Signed)
To PACU, VSS. Report to [email or web address] 

## 2020-02-29 NOTE — Op Note (Signed)
[facility] Patient Name: Glenda [name] Procedure Date: 02/29/2020 8:34 AM MRN: [id number] Endoscopist: Ladene Artist , MD Age: 69 Referring MD:  Date of Birth: [birth year] Gender: Female Account #: [id number] Procedure:                Colonoscopy Indications:              Surveillance: Personal history of adenomatous                            polyps on last colonoscopy > 5 years ago Medicines:                Monitored Anesthesia Care Procedure:                Pre-Anesthesia Assessment:                           - Prior to the procedure, a History and Physical                            was performed, and patient medications and                            allergies were reviewed. The patient's tolerance of                            previous anesthesia was also reviewed. The risks                            and benefits of the procedure and the sedation                            options and risks were discussed with the patient.                            All questions were answered, and informed consent                            was obtained. Prior Anticoagulants: The patient has                            taken no previous anticoagulant or antiplatelet                            agents. ASA Grade Assessment: II - A patient with                            mild systemic disease. After reviewing the risks                            and benefits, the patient was deemed in                            satisfactory condition to undergo the procedure.  After obtaining informed consent, the colonoscope                            was passed under direct vision. Throughout the                            procedure, the patient's blood pressure, pulse, and                            oxygen saturations were monitored continuously. The                            Colonoscope was introduced through the anus and                            advanced to the the cecum,  identified by                            appendiceal orifice and ileocecal valve. The                            ileocecal valve, appendiceal orifice, and rectum                            were photographed. The quality of the bowel                            preparation was good. The colonoscopy was performed                            without difficulty. The patient tolerated the                            procedure well. Scope In: 8:44:41 AM Scope Out: 9:02:37 AM Scope Withdrawal Time: 0 hours 15 minutes 20 seconds  Total Procedure Duration: 0 hours 17 minutes 56 seconds  Findings:                 The perianal and digital rectal examinations were                            normal.                           Three sessile polyps were found in the transverse                            colon (2) and cecum (1). The polyps were 7 to 8 mm                            in size. These polyps were removed with a cold                            snare. Resection and retrieval were complete.  A few small-mouthed diverticula were found in the                            left colon. There was no evidence of diverticular                            bleeding.                           Internal hemorrhoids were found during                            retroflexion. The hemorrhoids were small and Grade                            I (internal hemorrhoids that do not prolapse).                           The exam was otherwise without abnormality on                            direct and retroflexion views. Complications:            No immediate complications. Estimated blood loss:                            None. Estimated Blood Loss:     Estimated blood loss: none. Impression:               - Three 7 to 8 mm polyps in the transverse colon                            and in the cecum, removed with a cold snare.                            Resected and retrieved.                            - Mild diverticulosis in the left colon.                           - Internal hemorrhoids.                           - The examination was otherwise normal on direct                            and retroflexion views. Recommendation:           - Repeat colonoscopy after studies are complete for                            surveillance based on pathology results.                           - Patient has a contact number available for  emergencies. The signs and symptoms of potential                            delayed complications were discussed with the                            patient. Return to normal activities tomorrow.                            Written discharge instructions were provided to the                            patient.                           - High fiber diet.                           - Continue present medications.                           - Await pathology results. Ladene Artist, MD 02/29/2020 9:07:28 AM This report has been signed electronically.

## 2020-03-04 ENCOUNTER — Telehealth: Payer: Self-pay

## 2020-03-04 ENCOUNTER — Telehealth: Payer: Self-pay | Admitting: *Deleted

## 2020-03-04 NOTE — Telephone Encounter (Signed)
  Follow up Call-  Call back number 02/29/2020  Post procedure Call Back phone  # [phone]  Permission to leave phone message Yes  Some recent data might be hidden     1st follow up call made.  NALM

## 2020-03-04 NOTE — Telephone Encounter (Signed)
  Follow up Call-  Call back number 02/29/2020  Post procedure Call Back phone  # [phone]  Permission to leave phone message Yes  Some recent data might be hidden     Patient questions:  Do you have a fever, pain , or abdominal swelling? No. Pain Score  0 *  Have you tolerated food without any problems? Yes.    Have you been able to return to your normal activities? Yes.    Do you have any questions about your discharge instructions: Diet   No. Medications  No. Follow up visit  No.  Do you have questions or concerns about your Care? No.  Actions: * If pain score is 4 or above: No action needed, pain <4.  1. Have you developed a fever since your procedure? no  2.   Have you had an respiratory symptoms (SOB or cough) since your procedure? no  3.   Have you tested positive for COVID 19 since your procedure no  4.   Have you had any family members/close contacts diagnosed with the COVID 19 since your procedure?  No    If yes to any of these questions please route to Joylene John, RN and Joella Prince, RN

## 2020-03-14 ENCOUNTER — Encounter: Payer: Self-pay | Admitting: Gastroenterology

## 2020-05-18 ENCOUNTER — Other Ambulatory Visit: Payer: Self-pay | Admitting: Family Medicine

## 2020-06-16 ENCOUNTER — Emergency Department (HOSPITAL_COMMUNITY)
Admission: EM | Admit: 2020-06-16 | Discharge: 2020-06-16 | Disposition: A | Discharge status: Home / Self Care | Payer: Medicare PPO | Attending: Emergency Medicine | Admitting: Emergency Medicine

## 2020-06-16 ENCOUNTER — Emergency Department (HOSPITAL_COMMUNITY): Payer: Medicare PPO

## 2020-06-16 ENCOUNTER — Other Ambulatory Visit: Payer: Self-pay

## 2020-06-16 DIAGNOSIS — Z79899 Other long term (current) drug therapy: Secondary | ICD-10-CM | POA: Diagnosis not present

## 2020-06-16 DIAGNOSIS — F17210 Nicotine dependence, cigarettes, uncomplicated: Secondary | ICD-10-CM | POA: Diagnosis not present

## 2020-06-16 DIAGNOSIS — I10 Essential (primary) hypertension: Secondary | ICD-10-CM | POA: Insufficient documentation

## 2020-06-16 DIAGNOSIS — Z7982 Long term (current) use of aspirin: Secondary | ICD-10-CM | POA: Diagnosis not present

## 2020-06-16 DIAGNOSIS — I491 Atrial premature depolarization: Secondary | ICD-10-CM | POA: Diagnosis not present

## 2020-06-16 DIAGNOSIS — R55 Syncope and collapse: Secondary | ICD-10-CM

## 2020-06-16 DIAGNOSIS — W19XXXA Unspecified fall, initial encounter: Secondary | ICD-10-CM | POA: Diagnosis not present

## 2020-06-16 DIAGNOSIS — R000 Tachycardia, unspecified: Secondary | ICD-10-CM | POA: Diagnosis not present

## 2020-06-16 LAB — COMPREHENSIVE METABOLIC PANEL
ALT: 30 U/L (ref 0–44)
AST: 23 U/L (ref 15–41)
Albumin: 4 g/dL (ref 3.5–5.0)
Alkaline Phosphatase: 75 U/L (ref 38–126)
Anion gap: 11 (ref 5–15)
BUN: 17 mg/dL (ref 8–23)
CO2: 27 mmol/L (ref 22–32)
Calcium: 9.4 mg/dL (ref 8.9–10.3)
Chloride: 101 mmol/L (ref 98–111)
Creatinine, Ser: 0.72 mg/dL (ref 0.44–1.00)
GFR, Estimated: >60 mL/min (ref >60)
Glucose, Bld: 99 mg/dL (ref 70–99)
Potassium: 3.9 mmol/L (ref 3.5–5.1)
Sodium: 139 mmol/L (ref 135–145)
Total Bilirubin: 0.7 mg/dL (ref 0.3–1.2)
Total Protein: 7.6 g/dL (ref 6.5–8.1)

## 2020-06-16 LAB — CBC WITH DIFFERENTIAL/PLATELET
Abs Immature Granulocytes: 0.02 10*3/uL (ref 0.00–0.07)
Basophils Absolute: 0.1 10*3/uL (ref 0.0–0.1)
Basophils Relative: 1 %
Eosinophils Absolute: 0.2 10*3/uL (ref 0.0–0.5)
Eosinophils Relative: 2 %
HCT: 45.4 % (ref 36.0–46.0)
Hemoglobin: 15 g/dL (ref 12.0–15.0)
Immature Granulocytes: 0 %
Lymphocytes Relative: 18 %
Lymphs Abs: 1.6 10*3/uL (ref 0.7–4.0)
MCH: 31.8 pg (ref 26.0–34.0)
MCHC: 33 g/dL (ref 30.0–36.0)
MCV: 96.4 fL (ref 80.0–100.0)
Monocytes Absolute: 0.6 10*3/uL (ref 0.1–1.0)
Monocytes Relative: 7 %
Neutro Abs: 6.5 10*3/uL (ref 1.7–7.7)
Neutrophils Relative %: 72 %
Platelets: 272 10*3/uL (ref 150–400)
RBC: 4.71 MIL/uL (ref 3.87–5.11)
RDW: 12.7 % (ref 11.5–15.5)
WBC: 8.9 10*3/uL (ref 4.0–10.5)
nRBC: 0 % (ref 0.0–0.2)

## 2020-06-16 LAB — CBG MONITORING, ED: Glucose-Capillary: 80 mg/dL (ref 70–99)

## 2020-06-16 MED ORDER — SODIUM CHLORIDE 0.9 % IV BOLUS
1000.0000 mL | Freq: Once | INTRAVENOUS | Status: AC
  Administered 2020-06-16: 1000 mL via INTRAVENOUS

## 2020-06-16 NOTE — ED Triage Notes (Signed)
[facility] transferred pt from home and reports the following:  Pt was in bathroom using dryer and that is the last thing she remember when she woke up on the floor. No hx of syncope. No dizziness, nausea. NSR with PACs.

## 2020-06-16 NOTE — ED Provider Notes (Signed)
[facility] DEPT Provider Note   CSN: [id number] Arrival date & time: 06/16/20  1716     History No chief complaint on file.   Glenda [name] is a 70 y.o. female.  70 year old female with prior medical history as detailed below presents for evaluation of reported near syncopal event.  Patient reports that she had just completed taking a shower.  Patient reports that she stepped into her bathroom from the shower she felt lightheaded.  She lowered herself to the floor.  She did not pass out.  She reports prior episodes in the past of near fainting or near syncope.  She denies associated chest pain or palpitations.  She denies shortness of breath.  She reports that she did not injure herself when she went to the floor.  She reports feeling improved now.  She felt lightheaded.  Symptoms on the whole lasted approximately 20 minutes.  She did not have associated nausea or vomiting.  She denies recent illness or fevers  The history is provided by the patient and medical records.  Near Syncope This is a recurrent problem. The current episode started 1 to 2 hours ago. The problem occurs rarely. The problem has been resolved. Pertinent negatives include no chest pain, no abdominal pain, no headaches and no shortness of breath. Nothing aggravates the symptoms. Nothing relieves the symptoms.       Past Medical History:  Diagnosis Date  . Allergic rhinitis   . Allergy   . Anxiety   . Arthritis   . Cataract   . Heart murmur   . History of colonic polyps   . History of PSVT (paroxysmal supraventricular tachycardia)   . Hypertension   . Impaired glucose tolerance   . Obesity   . PAT (paroxysmal atrial tachycardia) ([place])   . Pre-diabetes    hx, not currently being monitor for diabetes, diet controlled  . Tobacco user   . Vertigo    started 3 weeks ago- after fluid trapped in ear    Patient Active Problem List   Diagnosis Date Noted  . Hyperlipidemia  11/24/2019  . Impaired glucose tolerance 03/13/2011  . Anxiety disorder, unspecified 11/25/2007  . ALLERGIC RHINITIS 11/25/2007  . History of colonic polyps 11/25/2007  . ONYCHOMYCOSIS, TOENAILS 12/15/2006  . Class 1 obesity with body mass index (BMI) of 32.0 to 32.9 in adult 11/24/2006  . PANIC DISORDER 11/24/2006  . Essential hypertension 11/24/2006  . PAROXYSMAL SUPRAVENTRICULAR TACHYCARDIA 11/24/2006    Past Surgical History:  Procedure Laterality Date  . ankle sx-left fx    . CATARACT EXTRACTION     both eyes  . CHOLECYSTECTOMY N/A 01/11/2018   Procedure: LAPAROSCOPIC CHOLECYSTECTOMY WITH INTRAOPERATIVE CHOLANGIOGRAM ERAS PATHWAY;  Surgeon: Alphonsa Overall, MD;  Location: [facility];  Service: General;  Laterality: N/A;  . COLONOSCOPY  04/23/2005  . foot sx     with the ankle surgery  . POLYPECTOMY       OB History    Gravida  3   Para  2   Term      Preterm      AB  1   Living        SAB      IAB      Ectopic      Multiple      Live Births              Family History  Problem Relation Age of Onset  . Healthy Mother   .  Dementia Father   . Healthy Sister   . Healthy Sister   . Alzheimer's disease Other   . Colon cancer Neg Hx   . Esophageal cancer Neg Hx   . Rectal cancer Neg Hx   . Stomach cancer Neg Hx   . Colon polyps Neg Hx     Social History   Tobacco Use  . Smoking status: Current Every Day Smoker    Packs/day: 0.50    Types: Cigarettes  . Smokeless tobacco: Never Used  Vaping Use  . Vaping Use: Never used  Substance Use Topics  . Alcohol use: Yes    Comment: weekends  . Drug use: No    Home Medications Prior to Admission medications   Medication Sig Start Date End Date Taking? Authorizing Provider  ALPRAZolam (XANAX) 0.25 MG tablet Take 1 tablet (0.25 mg total) by mouth daily as needed for anxiety. 06/09/19   Martinique, Betty G, MD  aspirin EC 81 MG tablet Take 81 mg by mouth daily.    [provider]  diclofenac Sodium  (VOLTAREN) 1 % GEL Apply 2 g topically daily as needed.    [provider]  diphenhydrAMINE (BENADRYL) 25 mg capsule Take 25 mg by mouth every 6 (six) hours as needed (for allergies.). Patient not taking: Reported on 02/29/2020    [provider]  ibuprofen (ADVIL,MOTRIN) 200 MG tablet Take 600 mg by mouth every 8 (eight) hours as needed (for pain.).     [provider]  metoprolol succinate (TOPROL-XL) 25 MG 24 hr tablet TAKE 1 TABLET BY MOUTH EVERY DAY 05/23/20   [place], Betty G, MD  Multiple Vitamin (MULTIVITAMIN) tablet Take 1 tablet by mouth daily.    [provider]  PARoxetine (PAXIL) 40 MG tablet TAKE 1 TABLET BY MOUTH EVERY DAY IN THE MORNING 02/19/20   [place], Betty G, MD    Allergies    Amoxicillin and Erythromycin  Review of Systems   Review of Systems  Respiratory: Negative for shortness of breath.   Cardiovascular: Positive for near-syncope. Negative for chest pain.  Gastrointestinal: Negative for abdominal pain.  Neurological: Negative for headaches.  All other systems reviewed and are negative.   Physical Exam Updated Vital Signs BP (!) 147/82   Pulse 84   Temp 98.5 F (36.9 C) (Oral)   Resp 13   SpO2 100%   Physical Exam Vitals and nursing note reviewed.  Constitutional:      General: She is not in acute distress.    Appearance: Normal appearance. She is well-developed and well-nourished.  HENT:     Head: Normocephalic and atraumatic.     Mouth/Throat:     Mouth: Oropharynx is clear and moist.  Eyes:     Extraocular Movements: EOM normal.     Conjunctiva/sclera: Conjunctivae normal.     Pupils: Pupils are equal, round, and reactive to light.  Cardiovascular:     Rate and Rhythm: Normal rate and regular rhythm.     Heart sounds: Normal heart sounds.  Pulmonary:     Effort: Pulmonary effort is normal. No respiratory distress.     Breath sounds: Normal breath sounds.  Abdominal:     General: There is no distension.      Palpations: Abdomen is soft.     Tenderness: There is no abdominal tenderness.  Musculoskeletal:        General: No deformity or edema. Normal range of motion.     Cervical back: Normal range of motion and neck  supple.  Skin:    General: Skin is warm and dry.  Neurological:     General: No focal deficit present.     Mental Status: She is alert and oriented to person, place, and time. Mental status is at baseline.     Cranial Nerves: No cranial nerve deficit.     Sensory: No sensory deficit.     Motor: No weakness.     Coordination: Coordination normal.  Psychiatric:        Mood and Affect: Mood and affect normal.     ED Results / Procedures / Treatments   Labs (all labs ordered are listed, but only abnormal results are displayed) Labs Reviewed  COMPREHENSIVE METABOLIC PANEL  CBC WITH DIFFERENTIAL/PLATELET  CBG MONITORING, ED    EKG EKG Interpretation  Date/Time:  Sunday June 16 2020 17:41:34 EST Ventricular Rate:  95 PR Interval:    QRS Duration: 90 QT Interval:  355 QTC Calculation: 447 R Axis:   82 Text Interpretation: Sinus rhythm Borderline right axis deviation Confirmed by Dene Gentry [phone]) on 06/16/2020 5:44:21 PM   Radiology CT Head Wo Contrast  Result Date: 06/16/2020 CLINICAL DATA:  Recent syncopal episode EXAM: CT HEAD WITHOUT CONTRAST TECHNIQUE: Contiguous axial images were obtained from the base of the skull through the vertex without intravenous contrast. COMPARISON:  None. FINDINGS: Brain: No evidence of acute infarction, hemorrhage, hydrocephalus, extra-axial collection or mass lesion/mass effect. Vascular: No hyperdense vessel or unexpected calcification. Skull: Normal. Negative for fracture or focal lesion. Sinuses/Orbits: No acute finding. Other: None. IMPRESSION: No acute intracranial abnormality noted. Electronically Signed   By: Inez Catalina M.D.   On: 06/16/2020 19:40   DG Chest Port 1 View  Result Date: 06/16/2020 CLINICAL DATA:  Syncopal  episode resulting in a fall. EXAM: PORTABLE CHEST 1 VIEW COMPARISON:  None. FINDINGS: Normal sized heart. Clear lungs with normal vascularity. Thoracic spine degenerative changes. No fracture or pneumothorax. IMPRESSION: No acute abnormality. Electronically Signed   By: Claudie Revering M.D.   On: 06/16/2020 18:15    Procedures Procedures   Medications Ordered in ED Medications  sodium chloride 0.9 % bolus 1,000 mL (1,000 mLs Intravenous New Bag/Given 06/16/20 1823)    ED Course  I have reviewed the triage vital signs and the nursing notes.  Pertinent labs & imaging results that were available during my care of the patient were reviewed by me and considered in my medical decision making (see chart for details).    MDM Rules/Calculators/A&P                          MDM  Screen complete  AIMAR SIEMINSKI was evaluated in Emergency Department on 06/16/2020 for the symptoms described in the history of present illness. She was evaluated in the context of the global COVID-19 pandemic, which necessitated consideration that the patient might be at risk for infection with the SARS-CoV-2 virus that causes COVID-19. Institutional protocols and algorithms that pertain to the evaluation of patients at risk for COVID-19 are in a state of rapid change based on information released by regulatory bodies including the CDC and federal and state organizations. These policies and algorithms were followed during the patient's care in the ED.  Patient is presenting following a reported near syncopal event.  Patient's event occurred after she stepped out of a hot shower.  She denies associated chest pain, palpitations, shortness of breath, or other significant complaint.  Patient's work-up was without significant  findings.  After her ED evaluation she now feels improved.  She has been able to ambulate to the restroom in the ED without difficulty.  Patient desires discharge home.  She does understand need for  close follow-up.  Strict return precautions given and understood.   Final Clinical Impression(s) / ED Diagnoses Final diagnoses:  Near syncope    Rx / DC Orders ED Discharge Orders    None       Valarie Merino, MD 06/16/20 2050

## 2020-06-16 NOTE — Discharge Instructions (Signed)
Please return for any problem.  Follow-up with regular care provider as instructed. °

## 2020-06-18 ENCOUNTER — Encounter: Payer: Self-pay | Admitting: Family Medicine

## 2020-06-19 ENCOUNTER — Encounter: Payer: Self-pay | Admitting: Family Medicine

## 2020-06-19 ENCOUNTER — Other Ambulatory Visit: Payer: Self-pay

## 2020-06-19 ENCOUNTER — Ambulatory Visit: Payer: Medicare PPO | Admitting: Family Medicine

## 2020-06-19 VITALS — BP 138/80 | HR 110 | Ht 67.0 in | Wt 211.0 lb

## 2020-06-19 DIAGNOSIS — R55 Syncope and collapse: Secondary | ICD-10-CM

## 2020-06-19 DIAGNOSIS — F419 Anxiety disorder, unspecified: Secondary | ICD-10-CM | POA: Diagnosis not present

## 2020-06-19 DIAGNOSIS — R253 Fasciculation: Secondary | ICD-10-CM

## 2020-06-19 DIAGNOSIS — I10 Essential (primary) hypertension: Secondary | ICD-10-CM | POA: Diagnosis not present

## 2020-06-19 NOTE — Progress Notes (Signed)
HPI: Ms.Ronnika C Cuen is a 70 y.o. female, who is here today with her husband to follow on recent ER visit.  She was evaluated in the ER because episode of near syncope. She presented to the ER via EMS on 06/16/20. At home about 30 min after showering,she just started drying her hair with the hair dryer when she had a "weird feeling",right arm feeling weak. Sinking feeling that lasted for about 2-3 seconds, legs felt weak and collapsed. She called her husband, who was in the house and he called 911. Her husband noted that she was pale. Feeling "jittery" until now. Negative for LOC but she was not able to talk during EMS evaluation, although she understood what is being said.It took about 2 hours for her to be back to her normal self.  No focal neurologic deficit.  Fire department arrived first to the house an found her BP low, 80's/?, it seemed to normalized when EMS checked it. She had already ate before episode. No associated seizure like activity,urine/bowel incontinence.  She has had milder episodes of weakness in the past.  Lab Results  Component Value Date   CREATININE 0.72 06/16/2020   BUN 17 06/16/2020   NA 139 06/16/2020   K 3.9 06/16/2020   CL 101 06/16/2020   CO2 27 06/16/2020   Lab Results  Component Value Date   WBC 8.9 06/16/2020   HGB 15.0 06/16/2020   HCT 45.4 06/16/2020   MCV 96.4 06/16/2020   PLT 272 06/16/2020   Lab Results  Component Value Date   CREATININE 0.72 06/16/2020   BUN 17 06/16/2020   NA 139 06/16/2020   K 3.9 06/16/2020   CL 101 06/16/2020   CO2 27 06/16/2020   Lab Results  Component Value Date   ALT 30 06/16/2020   AST 23 06/16/2020   ALKPHOS 75 06/16/2020   BILITOT 0.7 06/16/2020   Head YQ:[id number].  No hx of recent illness. Negative for headache,visual changes,CP,palpitations,SOB,or numbness. HTN: She is on Metoprolol succinate 25 mg daily. She is not checking BP frequently.  Neck pain radiated to shoulders. Hx of  DDD.  Anxiety, there has been some stress but nothing new. She takes Paroxetine 40 mg daily and Alprazolam 0.25 mg daily prn.  For the past 2 months she has had intermittent episodes of muscles "twitching."Problem is affecting upper and lower extremities. She has not identified exacerbating or alleviating factors.  Review of Systems  Constitutional: Negative for activity change, appetite change and fatigue.  HENT: Negative for mouth sores, nosebleeds and sore throat.   Respiratory: Negative for cough and wheezing.   Cardiovascular: Negative for leg swelling.  Gastrointestinal: Negative for abdominal pain, nausea and vomiting.       Negative for changes in bowel habits.  Genitourinary: Negative for decreased urine volume, dysuria and hematuria.  Neurological: Negative for syncope and facial asymmetry.  Rest see pertinent positives and negatives per HPI.  Current Outpatient Medications on File Prior to Visit  Medication Sig Dispense Refill  . ALPRAZolam (XANAX) 0.25 MG tablet Take 1 tablet (0.25 mg total) by mouth daily as needed for anxiety. 30 tablet 0  . aspirin EC 81 MG tablet Take 81 mg by mouth daily.    . diclofenac Sodium (VOLTAREN) 1 % GEL Apply 2 g topically daily as needed.    Marland Kitchen ibuprofen (ADVIL,MOTRIN) 200 MG tablet Take 600 mg by mouth every 8 (eight) hours as needed (for pain.).     Marland Kitchen metoprolol succinate (  TOPROL-XL) 25 MG 24 hr tablet TAKE 1 TABLET BY MOUTH EVERY DAY 90 tablet 1  . Multiple Vitamin (MULTIVITAMIN) tablet Take 1 tablet by mouth daily.    Marland Kitchen PARoxetine (PAXIL) 40 MG tablet TAKE 1 TABLET BY MOUTH EVERY DAY IN THE MORNING 90 tablet 3   No current facility-administered medications on file prior to visit.   Past Medical History:  Diagnosis Date  . Allergic rhinitis   . Allergy   . Anxiety   . Arthritis   . Cataract   . Heart murmur   . History of colonic polyps   . History of PSVT (paroxysmal supraventricular tachycardia)   . Hypertension   . Impaired  glucose tolerance   . Obesity   . PAT (paroxysmal atrial tachycardia) ([facility])   . Pre-diabetes    hx, not currently being monitor for diabetes, diet controlled  . Tobacco user   . Vertigo    started 3 weeks ago- after fluid trapped in ear   Allergies  Allergen Reactions  . Amoxicillin Rash and Other (See Comments)    Has patient had a PCN reaction causing immediate rash, facial/tongue/throat swelling, SOB or lightheadedness with hypotension: No HAS PT DEVELOPED SEVERE RASH INVOLVING MUCUS MEMBRANES or SKIN NECROSIS: #  #  YES  #  #  Has patient had a PCN reaction that required hospitalization: No Has patient had a PCN reaction occurring within the last 10 years: No   . Erythromycin Other (See Comments)    Upset stomach    Social History   Socioeconomic History  . Marital status: Married    Spouse name: Not on file  . Number of children: Not on file  . Years of education: Not on file  . Highest education level: Not on file  Occupational History  . Not on file  Tobacco Use  . Smoking status: Current Every Day Smoker    Packs/day: 0.50    Types: Cigarettes  . Smokeless tobacco: Never Used  Vaping Use  . Vaping Use: Never used  Substance and Sexual Activity  . Alcohol use: Yes    Comment: weekends  . Drug use: No  . Sexual activity: Not on file  Other Topics Concern  . Not on file  Social History Narrative  . Not on file   Social Determinants of Health   Financial Resource Strain: Not on file  Food Insecurity: Not on file  Transportation Needs: Not on file  Physical Activity: Not on file  Stress: Not on file  Social Connections: Not on file    Vitals:   06/19/20 1120  BP: 138/80  Pulse: (!) 110  SpO2: 97%   Body mass index is 33.05 kg/m.      Physical Exam Vitals and nursing note reviewed.  Constitutional:      General: She is not in acute distress.    Appearance: She is well-developed.  HENT:     Head: Normocephalic and atraumatic.      Mouth/Throat:     Mouth: Oropharynx is clear and moist and mucous membranes are normal. Mucous membranes are moist.     Pharynx: Oropharynx is clear.  Eyes:     Conjunctiva/sclera: Conjunctivae normal.     Pupils: Pupils are equal, round, and reactive to light.  Cardiovascular:     Rate and Rhythm: Normal rate and regular rhythm.     Pulses:          Dorsalis pedis pulses are 2+ on the right side and 2+  on the left side.     Heart sounds: No murmur heard.   Pulmonary:     Effort: Pulmonary effort is normal. No respiratory distress.     Breath sounds: Normal breath sounds.  Abdominal:     Palpations: Abdomen is soft. There is no hepatomegaly or mass.     Tenderness: There is no abdominal tenderness.  Musculoskeletal:        General: No edema.     Cervical back: Tenderness (Mild) present. No bony tenderness. Normal range of motion.       Back:  Lymphadenopathy:     Cervical: No cervical adenopathy.  Skin:    General: Skin is warm.     Findings: No erythema or rash.  Neurological:     General: No focal deficit present.     Mental Status: She is alert and oriented to person, place, and time.     Cranial Nerves: No cranial nerve deficit.     Motor: Motor function is intact.     Gait: Gait normal.     Deep Tendon Reflexes: Strength normal.     Reflex Scores:      Bicep reflexes are 2+ on the right side and 2+ on the left side.      Patellar reflexes are 2+ on the right side and 2+ on the left side. Psychiatric:        Mood and Affect: Mood and affect normal.     Comments: Well groomed, good eye contact.    ASSESSMENT AND PLAN:  Ms.Lafreda was seen today for follow-up.  Diagnoses and all orders for this visit: Orders Placed This Encounter  Procedures  . Ambulatory referral to Neurology    Muscular fasciculation New problem and generalized. No new medications. I think neuro evaluation is appropriate at this time. Monitor for new symptoms.  Pre-syncope Possible  causes discussed. Today neurologic exam is normal. Adequate hydration. Seizure disorder to be considered. She is not driving. Instructed about warning signs.  Essential hypertension BP adequately controlled today. Monitor BP at home. Continue low salt diet.  Anxiety disorder, unspecified type For now no changes in Paroxetine or Alprazolam dose.  Return in about 2 months (around 08/17/2020).  Ryenne Lynam G. [place], MD  [facility]. [place] office.   A few things to remember from today's visit:   Pre-syncope - Plan: Ambulatory referral to Neurology  Anxiety disorder, unspecified type  Essential hypertension  Muscular fasciculation - Plan: Ambulatory referral to Neurology  If you need refills please call your pharmacy. Do not use My Chart to request refills or for acute issues that need immediate attention.   Monitor blood pressure and pulse at home. Adequate hydration. No changes in Paroxetine or Metoprolol.  Please be sure medication list is accurate. If a new problem present, please set up appointment sooner than planned today.

## 2020-06-19 NOTE — Patient Instructions (Signed)
A few things to remember from today's visit:   Pre-syncope - Plan: Ambulatory referral to Neurology  Anxiety disorder, unspecified type  Essential hypertension  Muscular fasciculation - Plan: Ambulatory referral to Neurology  If you need refills please call your pharmacy. Do not use My Chart to request refills or for acute issues that need immediate attention.   Monitor blood pressure and pulse at home. Adequate hydration. No changes in Paroxetine or Metoprolol.  Please be sure medication list is accurate. If a new problem present, please set up appointment sooner than planned today.

## 2020-08-06 ENCOUNTER — Other Ambulatory Visit: Payer: Self-pay | Admitting: Family Medicine

## 2020-08-06 DIAGNOSIS — Z09 Encounter for follow-up examination after completed treatment for conditions other than malignant neoplasm: Secondary | ICD-10-CM

## 2020-08-16 DIAGNOSIS — I639 Cerebral infarction, unspecified: Secondary | ICD-10-CM

## 2020-08-16 HISTORY — DX: Cerebral infarction, unspecified: I63.9

## 2020-08-29 ENCOUNTER — Other Ambulatory Visit: Payer: Self-pay

## 2020-08-29 ENCOUNTER — Telehealth: Payer: Self-pay | Admitting: Neurology

## 2020-08-29 ENCOUNTER — Ambulatory Visit: Payer: Medicare PPO | Admitting: Neurology

## 2020-08-29 ENCOUNTER — Encounter: Payer: Self-pay | Admitting: Neurology

## 2020-08-29 VITALS — BP 132/78 | HR 80 | Ht 67.0 in | Wt 217.5 lb

## 2020-08-29 DIAGNOSIS — R531 Weakness: Secondary | ICD-10-CM | POA: Insufficient documentation

## 2020-08-29 DIAGNOSIS — M5412 Radiculopathy, cervical region: Secondary | ICD-10-CM | POA: Insufficient documentation

## 2020-08-29 NOTE — Progress Notes (Addendum)
Chief Complaint  Patient presents with  . New Patient (Initial Visit)    From PCP - 06/16/20 near syncope event about 30 min after showering - drying her hair when she had a "weird feeling",right arm feeling weak, sinking feeling that lasted for about 2-3 seconds, legs felt weak and collapsed, 911 called-pale, "jittery". Negative for LOC but not able to talk during EMS evaluation, although she understood what is being said.It took about 2 hours for her to feel normal. Similar events occurred.  Also, reporting intermittent muscle twitching/spasms throughout body w/ weakness.      ASSESSMENT AND PLAN  Glenda [name] is a 70 y.o. female   Suuden onset weakness   Known history of cervical spondylosis, right arm proximal muscle weakness, hyperreflexia of bilateral upper extremity and patella, bilateral Babinski signs  Differentiation diagnosis include cervical myelopathy, also need to rule out bilateral watershed/TIA  MRI of cervical spine, brain,  DIAGNOSTIC DATA (LABS, IMAGING, TESTING) - I reviewed patient records, labs, notes, testing and imaging myself where available.  Labs in Jun 16 2020, normal CBC, CMP, Glucose 80 on Jan 30th 2022,    HISTORICAL  Glenda [name] is a 70 year old female, seen in request by her primary care physician [place], Betty for evaluation of weakness, initial evaluation was on August 29, 2020  I reviewed and summarized the referring note.PMHX. Anxiety. HTN Paroxysmal supraventricular tachycardia  Patient reported a history of cervical spondylosis, about 20 years ago, she presented with severe neck pain, radiating pain to right arm, was deemed to be a surgical candidate, but her symptoms gradually improved, never went through further evaluation, she denies gait abnormality, denies bowel and bladder incontinence, but over the years, she had intermittent neck pain, radiating pain to bilateral shoulder, more to the right side, occasionally radiating pain down to  right arm, needle prick sensation at right lateral arm but denies persistent weakness  She also had a history of intermittent tachycardia, she can feel flareup of each tachycardia episode, heart racing fast, lightheadedness sensation, lasting for few minutes, she was evaluated by cardiologist in the past, was diagnosed with paroxysmal supraventricular tachycardia, often relieved by putting pressure on cardiac bulb,  She reported few episode of sudden onset weakness,  The first episode was on June 16, 2020, she was drying her hair using right arm, mild hyperextension of her neck, she suddenly felt right arm numbness, weakness, followed by lower extremity weakness, she has to sit down, no loss of consciousness, no confusion, just overwhelming weakness, she was able to walk to her sofa, symptoms gradually within couple hours  She did go to the emergency room, CT head showed no significant abnormalities, vital signs was within normal limit  She also reported 1 episode on August 28, 2020, she was at a family gathering around evening time, when she turned her head to talk with her family, she suddenly felt generalized weakness, legs become so weak, she could not bear weight, gradually recovered in few minutes  REVIEW OF SYSTEMS:  Full 14 system review of systems performed and notable only for as above All other review of systems were negative.  PHYSICAL EXAM:   Vitals:   08/29/20 1019  BP: 132/78  Pulse: 80  Weight: 217 lb 8 oz (98.7 kg)  Height: 5\' 7"  (1.702 m)   Not recorded     Body mass index is 34.07 kg/m.  PHYSICAL EXAMNIATION:  Gen: NAD, conversant, well nourised, well groomed  Cardiovascular: Regular rate rhythm, no peripheral edema, warm, nontender. Eyes: Conjunctivae clear without exudates or hemorrhage Neck: Supple, no carotid bruits. Pulmonary: Clear to auscultation bilaterally   NEUROLOGICAL EXAM:  MENTAL STATUS: Speech:    Speech is normal;  fluent and spontaneous with normal comprehension.  Cognition:     Orientation to time, place and person     Normal recent and remote memory     Normal Attention span and concentration     Normal Language, naming, repeating,spontaneous speech     Fund of knowledge   CRANIAL NERVES: CN II: Visual fields are full to confrontation. Pupils are round equal and briskly reactive to light. CN III, IV, VI: extraocular movement are normal. No ptosis. CN V: Facial sensation is intact to light touch CN VII: Face is symmetric with normal eye closure  CN VIII: Hearing is normal to causal conversation. CN IX, X: Phonation is normal. CN XI: Head turning and shoulder shrug are intact  MOTOR: Mild right arm extension, external rotation weakness,  REFLEXES: Reflexes are 3 and symmetric at the biceps, triceps, knees, and 1/1 ankles. Plantar responses are Babinski signs  SENSORY: Intact to light touch, pinprick and vibratory sensation are intact in fingers and toes.  COORDINATION: There is no trunk or limb dysmetria noted.  GAIT/STANCE: Able to get up from seated position with arms crossed, gait is steady with normal steps, base, arm swing, and turning. Heel and toe walking are normal. Tandem gait is normal.  Romberg is mildly positive  ALLERGIES: Allergies  Allergen Reactions  . Amoxicillin Rash and Other (See Comments)    Has patient had a PCN reaction causing immediate rash, facial/tongue/throat swelling, SOB or lightheadedness with hypotension: No HAS PT DEVELOPED SEVERE RASH INVOLVING MUCUS MEMBRANES or SKIN NECROSIS: #  #  YES  #  #  Has patient had a PCN reaction that required hospitalization: No Has patient had a PCN reaction occurring within the last 10 years: No   . Erythromycin Other (See Comments)    Upset stomach    HOME MEDICATIONS: Current Outpatient Medications  Medication Sig Dispense Refill  . ALPRAZolam (XANAX) 0.25 MG tablet Take 1 tablet (0.25 mg total) by mouth daily  as needed for anxiety. 30 tablet 0  . aspirin EC 81 MG tablet Take 81 mg by mouth daily.    . diclofenac Sodium (VOLTAREN) 1 % GEL Apply 2 g topically daily as needed.    Marland Kitchen ibuprofen (ADVIL,MOTRIN) 200 MG tablet Take 600 mg by mouth every 8 (eight) hours as needed (for pain.).     Marland Kitchen metoprolol succinate (TOPROL-XL) 25 MG 24 hr tablet TAKE 1 TABLET BY MOUTH EVERY DAY 90 tablet 1  . Multiple Vitamin (MULTIVITAMIN) tablet Take 1 tablet by mouth daily.    Marland Kitchen PARoxetine (PAXIL) 40 MG tablet TAKE 1 TABLET BY MOUTH EVERY DAY IN THE MORNING 90 tablet 3   No current facility-administered medications for this visit.    PAST MEDICAL HISTORY: Past Medical History:  Diagnosis Date  . Allergic rhinitis   . Allergy   . Anxiety   . Arthritis   . Cataract   . Heart murmur   . History of colonic polyps   . History of PSVT (paroxysmal supraventricular tachycardia)   . Hypertension   . Impaired glucose tolerance   . Obesity   . PAT (paroxysmal atrial tachycardia) ([place])   . Pre-diabetes    hx, not currently being monitor for diabetes, diet controlled  . Tobacco user   .  Vertigo    started 3 weeks ago- after fluid trapped in ear    PAST SURGICAL HISTORY: Past Surgical History:  Procedure Laterality Date  . ankle sx-left fx    . CATARACT EXTRACTION     both eyes  . CHOLECYSTECTOMY N/A 01/11/2018   Procedure: LAPAROSCOPIC CHOLECYSTECTOMY WITH INTRAOPERATIVE CHOLANGIOGRAM ERAS PATHWAY;  Surgeon: Alphonsa Overall, MD;  Location: [facility];  Service: General;  Laterality: N/A;  . COLONOSCOPY  04/23/2005  . foot sx     with the ankle surgery  . POLYPECTOMY      FAMILY HISTORY: Family History  Problem Relation Age of Onset  . Healthy Mother   . Dementia Father   . Healthy Sister   . Healthy Sister   . Alzheimer's disease Other   . Colon cancer Neg Hx   . Esophageal cancer Neg Hx   . Rectal cancer Neg Hx   . Stomach cancer Neg Hx   . Colon polyps Neg Hx     SOCIAL HISTORY: Social History    Socioeconomic History  . Marital status: Married    Spouse name: Not on file  . Number of children: 2  . Years of education: college  . Highest education level: Not on file  Occupational History  . Occupation: Retired  Tobacco Use  . Smoking status: Current Every Day Smoker    Packs/day: 0.50    Types: Cigarettes  . Smokeless tobacco: Never Used  Vaping Use  . Vaping Use: Never used  Substance and Sexual Activity  . Alcohol use: Yes    Comment: weekends  . Drug use: No  . Sexual activity: Not on file  Other Topics Concern  . Not on file  Social History Narrative   Lives at home with her husband.   Right-handed.   One cup caffeine per day.   Social Determinants of Health   Financial Resource Strain: Not on file  Food Insecurity: Not on file  Transportation Needs: Not on file  Physical Activity: Not on file  Stress: Not on file  Social Connections: Not on file  Intimate Partner Violence: Not on file      Marcial Pacas, M.D. Ph.D.  [facility] [address], [place], [place] [address] Ph: [phone] Fax: [phone]  CC:  Martin[place], Betty G, MD [place],  [place] [address]  [place], Betty G, MD

## 2020-08-29 NOTE — Telephone Encounter (Signed)
I called to check the status it is still in nurse review.

## 2020-08-29 NOTE — Telephone Encounter (Signed)
Patient is scheduled at [facility] for Sunday 09/01/20 to arrive at 11:30 am at the main entrance. Patient is aware of time & day.

## 2020-08-29 NOTE — Telephone Encounter (Signed)
[employer] ath: [id number] (exp. 08/29/20 to 09/28/20) can you put the orders in as urgent. I can get them scheduled at the hospital.

## 2020-08-29 NOTE — Telephone Encounter (Signed)
[employer] pending uploaded notes on the portal i did mark it urgent hopefully will hear back soon

## 2020-09-01 ENCOUNTER — Other Ambulatory Visit: Payer: Self-pay

## 2020-09-01 ENCOUNTER — Ambulatory Visit (HOSPITAL_COMMUNITY)
Admission: RE | Admit: 2020-09-01 | Discharge: 2020-09-01 | Disposition: A | Discharge status: Home / Self Care | Payer: Medicare PPO | Source: Ambulatory Visit | Attending: Neurology | Admitting: Neurology

## 2020-09-01 DIAGNOSIS — R531 Weakness: Secondary | ICD-10-CM

## 2020-09-01 DIAGNOSIS — M47812 Spondylosis without myelopathy or radiculopathy, cervical region: Secondary | ICD-10-CM | POA: Diagnosis not present

## 2020-09-01 DIAGNOSIS — M5412 Radiculopathy, cervical region: Secondary | ICD-10-CM | POA: Insufficient documentation

## 2020-09-01 DIAGNOSIS — I639 Cerebral infarction, unspecified: Secondary | ICD-10-CM | POA: Diagnosis not present

## 2020-09-01 DIAGNOSIS — M4802 Spinal stenosis, cervical region: Secondary | ICD-10-CM | POA: Diagnosis not present

## 2020-09-01 DIAGNOSIS — M2578 Osteophyte, vertebrae: Secondary | ICD-10-CM | POA: Diagnosis not present

## 2020-09-01 DIAGNOSIS — M4302 Spondylolysis, cervical region: Secondary | ICD-10-CM | POA: Diagnosis not present

## 2020-09-02 ENCOUNTER — Telehealth: Payer: Self-pay | Admitting: Neurology

## 2020-09-02 DIAGNOSIS — G959 Disease of spinal cord, unspecified: Secondary | ICD-10-CM | POA: Insufficient documentation

## 2020-09-02 DIAGNOSIS — I639 Cerebral infarction, unspecified: Secondary | ICD-10-CM | POA: Insufficient documentation

## 2020-09-02 NOTE — Telephone Encounter (Signed)
  IMPRESSION: 1. No acute traumatic injury within the cervical spine. 2. Advanced degenerative spondylolysis at C5-6 with resultant severe spinal stenosis, with severe bilateral C6 foraminal narrowing. 3. Left eccentric disc osteophyte complex at C6-7 with resultant moderate canal and bilateral C7 foraminal stenosis. 4. Right eccentric disc osteophyte complex at C4-5 with resultant mild canal and moderate right C5 foraminal stenosis.  1. Punctate focus of DWI hyperintensity in the high left parasagittal frontal cortex, which may represent a small acute/subacute infarct versus artifact. No associated edema or mass effect. 2. Scattered mild to moderate for age T2/FLAIR hyperintensities in the white matter, nonspecific but most likely related to chronic microvascular ischemic disease. 3. Remote right cerebellar lacunar infarct.  Failed to reach patient by her cell phone, and the home number listed, left a message for her to call back  MRI of cervical spine showed advanced degenerative spondylosis, most severe at C5-6, with severe spinal stenosis, bilateral foraminal narrowing, moderate canal stenosis at C6-7,  MRI of the brain showed evidence of right cerebellar lacunar infarction, mild supratentorium small vessel disease  I put in referral for neurosurgeon, for potential cervical decompression  Will also complete stroke evaluation with echocardiogram, ultrasound of carotid artery, also for presurgical risk factor stratification, she should start aspirin 81 mg daily

## 2020-09-02 NOTE — Telephone Encounter (Signed)
Noted . Referral sent to Neuro Surgery My chart  Sent to PT .

## 2020-09-02 NOTE — Telephone Encounter (Signed)
I spoke to the patient and provided her with the results below. She verbalized understanding. Reports already taking aspirin 81mg  daily and will continue it. She is agreeable to compete the ordered test and see neurosurgery.

## 2020-09-13 ENCOUNTER — Ambulatory Visit (HOSPITAL_COMMUNITY)
Admission: RE | Admit: 2020-09-13 | Discharge: 2020-09-13 | Disposition: A | Discharge status: Home / Self Care | Payer: Medicare PPO | Source: Ambulatory Visit | Attending: Neurology | Admitting: Neurology

## 2020-09-13 ENCOUNTER — Other Ambulatory Visit: Payer: Self-pay

## 2020-09-13 ENCOUNTER — Ambulatory Visit (HOSPITAL_BASED_OUTPATIENT_CLINIC_OR_DEPARTMENT_OTHER)
Admission: RE | Admit: 2020-09-13 | Discharge: 2020-09-13 | Disposition: A | Discharge status: Home / Self Care | Payer: Medicare PPO | Source: Ambulatory Visit | Attending: Neurology | Admitting: Neurology

## 2020-09-13 DIAGNOSIS — I10 Essential (primary) hypertension: Secondary | ICD-10-CM | POA: Insufficient documentation

## 2020-09-13 DIAGNOSIS — G959 Disease of spinal cord, unspecified: Secondary | ICD-10-CM | POA: Diagnosis not present

## 2020-09-13 DIAGNOSIS — I639 Cerebral infarction, unspecified: Secondary | ICD-10-CM | POA: Diagnosis not present

## 2020-09-13 DIAGNOSIS — I6389 Other cerebral infarction: Secondary | ICD-10-CM

## 2020-09-13 LAB — ECHOCARDIOGRAM COMPLETE
Area-P 1/2: 3.27 cm2
Calc EF: 62.6 %
S' Lateral: 2.7 cm
Single Plane A2C EF: 64.8 %
Single Plane A4C EF: 64.5 %

## 2020-09-13 NOTE — Progress Notes (Signed)
  Echocardiogram 2D Echocardiogram has been performed.  Jennette Dubin 09/13/2020, 9:58 AM

## 2020-09-13 NOTE — Progress Notes (Signed)
Carotid duplex has been completed.   Preliminary results in CV Proc.   Abram Sander 09/13/2020 9:46 AM  .

## 2020-09-17 ENCOUNTER — Telehealth: Payer: Self-pay | Admitting: Neurology

## 2020-09-17 ENCOUNTER — Ambulatory Visit
Admission: RE | Admit: 2020-09-17 | Discharge: 2020-09-17 | Disposition: A | Discharge status: Home / Self Care | Payer: Medicare PPO | Source: Ambulatory Visit | Attending: Family Medicine | Admitting: Family Medicine

## 2020-09-17 ENCOUNTER — Other Ambulatory Visit: Payer: Self-pay

## 2020-09-17 ENCOUNTER — Other Ambulatory Visit: Payer: Self-pay | Admitting: Family Medicine

## 2020-09-17 DIAGNOSIS — R921 Mammographic calcification found on diagnostic imaging of breast: Secondary | ICD-10-CM

## 2020-09-17 DIAGNOSIS — R922 Inconclusive mammogram: Secondary | ICD-10-CM | POA: Diagnosis not present

## 2020-09-17 DIAGNOSIS — Z09 Encounter for follow-up examination after completed treatment for conditions other than malignant neoplasm: Secondary | ICD-10-CM

## 2020-09-17 NOTE — Telephone Encounter (Signed)
Could you explain the results of the carotid artery test, in layman terms? I'm a bit overwhelmed by all the info, especially related to the TIA. Could all problems be related to the cervical stenosis or am I dealing with separate issues? Like I said, I'm a bit overwhelmed and my google searches are not helping!! Thank you.     MRI of the brain 1. Punctate focus of DWI hyperintensity in the high left parasagittal frontal cortex, which may represent a small acute/subacute infarct versus artifact. No associated edema or mass effect. 2. Scattered mild to moderate for age T2/FLAIR hyperintensities in the white matter, nonspecific but most likely related to chronic microvascular ischemic disease. 3. Remote right cerebellar lacunar infarct.  Ultrasound of carotid artery Right Carotid: Velocities in the right ICA are consistent with a 1-39% stenosis. Left Carotid: Velocities in the left ICA are consistent with a 1-39% stenosis. Vertebrals: Bilateral vertebral arteries demonstrate antegrade flow.  Please call patient, to check on her neurosurgical evaluation,  There are reported small vessel disease  on MRI of the brain, ultrasound of carotid artery to serve as a presurgical stratification, there was no evidence of large vessel disease, no reported less than 39% stenosis, consistent with mild atherosclerotic changes, That is related to her age,

## 2020-09-18 NOTE — Telephone Encounter (Signed)
Faxed referral to [facility] for cervical myelopathy again today. Included results from MRI, Carotid, and echocardiogram. Fax: [phone]. 

## 2020-09-18 NOTE — Telephone Encounter (Signed)
Faxed referral to [facility] for cervical myelopathy again today. Included results from MRI, Carotid, and echocardiogram. Fax: [phone].

## 2020-09-18 NOTE — Telephone Encounter (Signed)
I called the patient and reviewed the results. She verbalized understanding. She has a pending appt on 09/26/20 to further discuss with Dr. Krista Blue.   She has still not heard from [facility]. She is aware we will reach out to our referral team to check on this for her. She would like a call back with any update.

## 2020-09-18 NOTE — Telephone Encounter (Signed)
I called [facility] and was told they have not received a referral from [place] for this patient. I have let the patient know that I will send the referral to them today with the additional test results she has received.

## 2020-09-26 ENCOUNTER — Encounter: Payer: Self-pay | Admitting: Neurology

## 2020-09-26 ENCOUNTER — Telehealth: Payer: Self-pay | Admitting: Neurology

## 2020-09-26 ENCOUNTER — Ambulatory Visit: Payer: Medicare PPO | Admitting: Neurology

## 2020-09-26 VITALS — BP 167/81 | HR 83 | Ht 67.0 in | Wt 216.0 lb

## 2020-09-26 DIAGNOSIS — R202 Paresthesia of skin: Secondary | ICD-10-CM | POA: Diagnosis not present

## 2020-09-26 DIAGNOSIS — G959 Disease of spinal cord, unspecified: Secondary | ICD-10-CM | POA: Diagnosis not present

## 2020-09-26 NOTE — Telephone Encounter (Signed)
Glenda [name] from [facility] called me back and stated that they are about 7-8 days behind. They did receive her referral and she left her a voicemail on her home & cell phone number this morning.

## 2020-09-26 NOTE — Progress Notes (Signed)
Chief Complaint  Patient presents with  . Follow-up    She is here to review her test results (carotid ultrasound, MRI brain, MRI cervical).       ASSESSMENT AND PLAN  Glenda [name] is a 70 y.o. female   Cervical spondylitic myelopathy,  Significant stenosis at C5-6, C6-7 level,   Hyperreflexia, her  transient body position related weakness likely related to further compromise of already very narrow cervical spinal stenosis  Refer her to neurosurgeon for decompression surgery  DIAGNOSTIC DATA (LABS, IMAGING, TESTING) - I reviewed patient records, labs, notes, testing and imaging myself where available.  Labs in Jun 16 2020, normal CBC, CMP, Glucose 80 on Jan 30th 2022,    HISTORICAL  Glenda [name] is a 70 year old female, seen in request by her primary care physician [place], Betty for evaluation of weakness, initial evaluation was on August 29, 2020  I reviewed and summarized the referring note.PMHX. Anxiety. HTN Paroxysmal supraventricular tachycardia  Patient reported a history of cervical spondylosis, about 20 years ago, she presented with severe neck pain, radiating pain to right arm, was told to be a surgical candidate, but was improve of her symptoms, she did not pursue further, she denies gait abnormality, denies bowel and bladder incontinence, but over the years, she had intermittent neck pain, radiating pain to bilateral shoulder, more to the right side, occasionally radiating pain down to right arm, needle prick sensation at right lateral arm but denies persistent weakness  She also had a history of intermittent tachycardia, she can feel flareup of each tachycardia episode, heart racing fast, lightheadedness sensation, lasting for few minutes, she was evaluated by cardiologist in the past, was diagnosed with paroxysmal supraventricular tachycardia, often relieved by putting pressure on cardiac bulb,  She reported few episode of sudden onset weakness,  The first  episode was on June 16, 2020, she was drying her hair using right arm, mild hyperextension of her neck, she suddenly felt right arm numbness, weakness, followed by lower extremity weakness, she has to sit down, no loss of consciousness, no confusion, just overwhelming weakness, she was able to walk to her sofa, symptoms gradually within couple hours  She did go to the emergency room, CT head showed no significant abnormalities, vital signs was within normal limit  She also reported 1 episode on August 28, 2020, she was at a family gathering around evening time, when she turned her head to talk with her family, she suddenly felt generalized weakness, legs become so weak, she could not bear weight, gradually recovered in few minutes  UPDATE Sep 26 2020: She return to review MRI findings, MRI of cervical spine on September 01, 2020 showing advanced degenerative spondylosis, with severe spinal stenosis, severe bilateral C6 foraminal narrowing, left eccentric disc osteophyte complex at C6-7, there is moderate canal, bilateral C7 foraminal stenosis, right eccentric disc osteophyte complex at C4-5 with resultant mild canal, moderate right C5 foraminal narrowing MRI of the brain without contrast, punctuated DWI hyperintensity in the high left parasagittal frontal cortex, likely represent a small acute/subacute infarction, moderate supratentorium small vessel disease  Echocardiogram showed no significant abnormality,  Ultrasound of carotid artery showed less than 59% stenosis bilaterally  REVIEW OF SYSTEMS:  Full 14 system review of systems performed and notable only for as above All other review of systems were negative.  PHYSICAL EXAM:   Vitals:   09/26/20 0855  BP: (!) 167/81  Pulse: 83  Weight: 216 lb (98 kg)  Height: 5\' 7"  (  1.702 m)   Not recorded     Body mass index is 33.83 kg/m.  PHYSICAL EXAMNIATION:  Gen: NAD, conversant, well nourised, well groomed      NEUROLOGICAL  EXAM:  MENTAL STATUS: Speech/cognition: Awake alert oriented to history taking and casual conversation   CRANIAL NERVES: CN II: Visual fields are full to confrontation. Pupils are round equal and briskly reactive to light. CN III, IV, VI: extraocular movement are normal. No ptosis. CN V: Facial sensation is intact to light touch CN VII: Face is symmetric with normal eye closure  CN VIII: Hearing is normal to causal conversation. CN IX, X: Phonation is normal. CN XI: Head turning and shoulder shrug are intact  MOTOR: Mild right arm extension, external rotation weakness,  REFLEXES: Reflexes are 2/2 and symmetric at the biceps, triceps, 3/3 knees, and 1/1 ankles. Plantar responses are Babinski signs  SENSORY: Intact to light touch, pinprick and vibratory sensation are intact in fingers and toes.  COORDINATION: There is no trunk or limb dysmetria noted.  GAIT/STANCE: Able to get up from seated position with arms crossed, gait is steady with normal steps, base, arm swing, and turning. Heel and toe walking are normal. Tandem gait is normal.  Romberg is mildly positive  ALLERGIES: Allergies  Allergen Reactions  . Amoxicillin Rash and Other (See Comments)    Has patient had a PCN reaction causing immediate rash, facial/tongue/throat swelling, SOB or lightheadedness with hypotension: No HAS PT DEVELOPED SEVERE RASH INVOLVING MUCUS MEMBRANES or SKIN NECROSIS: #  #  YES  #  #  Has patient had a PCN reaction that required hospitalization: No Has patient had a PCN reaction occurring within the last 10 years: No   . Erythromycin Other (See Comments)    Upset stomach    HOME MEDICATIONS: Current Outpatient Medications  Medication Sig Dispense Refill  . ALPRAZolam (XANAX) 0.25 MG tablet Take 1 tablet (0.25 mg total) by mouth daily as needed for anxiety. 30 tablet 0  . aspirin EC 81 MG tablet Take 81 mg by mouth daily.    . diclofenac Sodium (VOLTAREN) 1 % GEL Apply 2 g topically  daily as needed.    Marland Kitchen ibuprofen (ADVIL,MOTRIN) 200 MG tablet Take 600 mg by mouth every 8 (eight) hours as needed (for pain.).     Marland Kitchen metoprolol succinate (TOPROL-XL) 25 MG 24 hr tablet TAKE 1 TABLET BY MOUTH EVERY DAY 90 tablet 1  . Multiple Vitamin (MULTIVITAMIN) tablet Take 1 tablet by mouth daily.    Marland Kitchen PARoxetine (PAXIL) 40 MG tablet TAKE 1 TABLET BY MOUTH EVERY DAY IN THE MORNING 90 tablet 3   No current facility-administered medications for this visit.    PAST MEDICAL HISTORY: Past Medical History:  Diagnosis Date  . Allergic rhinitis   . Allergy   . Anxiety   . Arthritis   . Cataract   . Heart murmur   . History of colonic polyps   . History of PSVT (paroxysmal supraventricular tachycardia)   . Hypertension   . Impaired glucose tolerance   . Obesity   . PAT (paroxysmal atrial tachycardia) ([facility])   . Pre-diabetes    hx, not currently being monitor for diabetes, diet controlled  . Tobacco user   . Vertigo    started 3 weeks ago- after fluid trapped in ear    PAST SURGICAL HISTORY: Past Surgical History:  Procedure Laterality Date  . ankle sx-left fx    . CATARACT EXTRACTION     both  eyes  . CHOLECYSTECTOMY N/A 01/11/2018   Procedure: LAPAROSCOPIC CHOLECYSTECTOMY WITH INTRAOPERATIVE CHOLANGIOGRAM ERAS PATHWAY;  Surgeon: Alphonsa Overall, MD;  Location: [facility];  Service: General;  Laterality: N/A;  . COLONOSCOPY  04/23/2005  . foot sx     with the ankle surgery  . POLYPECTOMY      FAMILY HISTORY: Family History  Problem Relation Age of Onset  . Healthy Mother   . Dementia Father   . Healthy Sister   . Healthy Sister   . Alzheimer's disease Other   . Colon cancer Neg Hx   . Esophageal cancer Neg Hx   . Rectal cancer Neg Hx   . Stomach cancer Neg Hx   . Colon polyps Neg Hx     SOCIAL HISTORY: Social History   Socioeconomic History  . Marital status: Married    Spouse name: Not on file  . Number of children: 2  . Years of education: college  . Highest  education level: Not on file  Occupational History  . Occupation: Retired  Tobacco Use  . Smoking status: Current Every Day Smoker    Packs/day: 0.50    Types: Cigarettes  . Smokeless tobacco: Never Used  Vaping Use  . Vaping Use: Never used  Substance and Sexual Activity  . Alcohol use: Yes    Comment: weekends  . Drug use: No  . Sexual activity: Not on file  Other Topics Concern  . Not on file  Social History Narrative   Lives at home with her husband.   Right-handed.   One cup caffeine per day.   Social Determinants of Health   Financial Resource Strain: Not on file  Food Insecurity: Not on file  Transportation Needs: Not on file  Physical Activity: Not on file  Stress: Not on file  Social Connections: Not on file  Intimate Partner Violence: Not on file      Marcial Pacas, M.D. Ph.D.  [facility] [address], [place], [place] [address] Ph: [phone] Fax: [phone]  CC:  Martin[place], Betty G, MD [place],  [place] [address]  [place], Betty G, MD

## 2020-09-26 NOTE — Telephone Encounter (Signed)
I have put in refer to neurosurgeon on September 02 2020, patient was not contacted, please make sure that she is on schedule with them

## 2020-10-01 DIAGNOSIS — G959 Disease of spinal cord, unspecified: Secondary | ICD-10-CM | POA: Diagnosis not present

## 2020-10-02 ENCOUNTER — Other Ambulatory Visit: Payer: Self-pay | Admitting: Family Medicine

## 2020-10-04 NOTE — Telephone Encounter (Signed)
Last OV: 06/19/2020 Last Refill: 06/09/2019 Disp: 30    R: 0 Future OV: 11/25/2020 

## 2020-10-05 ENCOUNTER — Other Ambulatory Visit: Payer: Self-pay | Admitting: Family Medicine

## 2020-10-07 ENCOUNTER — Other Ambulatory Visit: Payer: Self-pay | Admitting: Neurosurgery

## 2020-10-07 MED ORDER — ALPRAZOLAM 0.25 MG PO TABS: 0.2500 mg | ORAL_TABLET | Freq: Every day | ORAL | 0 refills | Status: DC | PRN

## 2020-10-07 NOTE — Telephone Encounter (Signed)
Last OV: 06/19/2020 Last Refill: 06/09/2019 Disp: 30    R: 0 Future OV: 11/25/2020

## 2020-10-18 ENCOUNTER — Other Ambulatory Visit: Payer: Self-pay | Admitting: Neurosurgery

## 2020-10-23 NOTE — Progress Notes (Signed)
Surgical Instructions    Your procedure is scheduled on Monday 10/28/2020.  Report to [facility] Main Entrance "A" at 11:45 A.M., then check in with the Admitting office.  Call [phone] if you have problems or questions between now and the morning of surgery:    Remember:  Do not eat or drink after midnight the night before your surgery     Take these medicines the morning of surgery with A SIP OF WATER: Alprazolam (Xanax) - if needed Diphenhydramine (Benadryl) - if needed Metoprolol succinate (Toprol-XL) Paroxetine (Paxil)   As of today, STOP taking any Aspirin-containing products, Nsaids - such as Aleve, Naproxen, Ibuprofen, Motrin, Advil, Goody's, BC's, all herbal medications, fish oil, and all vitamins.  Follow your surgeon's instructions on when to stop Aspirin.  If no instructions were given by your surgeon then you will need to call the office to get those instructions.             Do not wear jewelry or makeup Do not wear lotions, powders, perfumes, or deodorant. Do not shave 48 hours prior to surgery. Do not wear nail polish, gel polish, artificial nails, or any other type of covering on  natural nails including finger and toenails.  If patients have artificial nails, gel coating, etc. that need to be removed by a nail salon please have this removed prior to surgery or surgery may need to be canceled/delayed if the surgeon/ anesthesia feels like the patient is unable to be adequately monitored.  Do not bring valuables to the hospital.   [facility] is not responsible for any belongings or valuables.   Do NOT Smoke (Tobacco/Vaping) or drink Alcohol 24 hours prior to your procedure  If you use a CPAP at night, you may bring all equipment for your overnight stay.   Contacts, glasses, hearing aids, dentures or partials may not be worn into surgery, please bring cases for these belongings   For patients admitted to the hospital, discharge time will be determined by your  treatment team.   Patients discharged the day of surgery will not be allowed to drive home, and someone needs to stay with them for 24 hours.   Special instructions:    Oral Hygiene is also important to reduce your risk of infection.  Remember - BRUSH YOUR TEETH THE MORNING OF SURGERY WITH YOUR REGULAR TOOTHPASTE   [facility]- Preparing For Surgery  Before surgery, you can play an important role. Because skin is not sterile, your skin needs to be as free of germs as possible. You can reduce the number of germs on your skin by washing with CHG (chlorahexidine gluconate) Soap before surgery.  CHG is an antiseptic cleaner which kills germs and bonds with the skin to continue killing germs even after washing.     Please do not use if you have an allergy to CHG or antibacterial soaps. If your skin becomes reddened/irritated stop using the CHG.  Do not shave (including legs and underarms) for at least 48 hours prior to first CHG shower. It is OK to shave your face.  Please follow these instructions carefully.    1.  Shower the NIGHT BEFORE SURGERY and the MORNING OF SURGERY with CHG Soap.   If you chose to wash your hair, wash your hair first as usual with your normal shampoo. After you shampoo, rinse your hair and body thoroughly to remove the shampoo.  Then [employer] and genitals (private parts) with your normal soap and rinse thoroughly  to remove soap.  2. After that Use CHG Soap as you would any other liquid soap. You can apply CHG directly to the skin and wash gently with a scrungie or a clean washcloth.   3. Apply the CHG Soap to your body ONLY FROM THE NECK DOWN.  Do not use on open wounds or open sores. Avoid contact with your eyes, ears, mouth and genitals (private parts). Wash Face and genitals (private parts)  with your normal soap.   4. Wash thoroughly, paying special attention to the area where your surgery will be performed.  5. Thoroughly rinse your body with warm water from  the neck down.  6. DO NOT shower/wash with your normal soap after using and rinsing off the CHG Soap.  7. Pat yourself dry with a CLEAN TOWEL.  8. Wear CLEAN PAJAMAS to bed the night before surgery  9. Place CLEAN SHEETS on your bed the night before your surgery  10. DO NOT SLEEP WITH PETS.   Day of Surgery:  Take a shower with CHG soap. Wear Clean/Comfortable clothing the morning of surgery Do not apply any deodorants/lotions.   Remember to brush your teeth WITH YOUR REGULAR TOOTHPASTE.   Please read over the following fact sheets that you were given.

## 2020-10-24 ENCOUNTER — Other Ambulatory Visit: Payer: Self-pay

## 2020-10-24 ENCOUNTER — Encounter (HOSPITAL_COMMUNITY): Payer: Self-pay

## 2020-10-24 ENCOUNTER — Encounter (HOSPITAL_COMMUNITY)
Admission: RE | Admit: 2020-10-24 | Discharge: 2020-10-24 | Disposition: A | Discharge status: Home / Self Care | Payer: Medicare PPO | Source: Ambulatory Visit | Attending: Neurosurgery | Admitting: Neurosurgery

## 2020-10-24 DIAGNOSIS — Z20822 Contact with and (suspected) exposure to covid-19: Secondary | ICD-10-CM | POA: Diagnosis not present

## 2020-10-24 DIAGNOSIS — Z01812 Encounter for preprocedural laboratory examination: Secondary | ICD-10-CM | POA: Insufficient documentation

## 2020-10-24 LAB — CBC
HCT: 45.6 % (ref 36.0–46.0)
Hemoglobin: 14.7 g/dL (ref 12.0–15.0)
MCH: 31.4 pg (ref 26.0–34.0)
MCHC: 32.2 g/dL (ref 30.0–36.0)
MCV: 97.4 fL (ref 80.0–100.0)
Platelets: 293 10*3/uL (ref 150–400)
RBC: 4.68 MIL/uL (ref 3.87–5.11)
RDW: 12.9 % (ref 11.5–15.5)
WBC: 6.6 10*3/uL (ref 4.0–10.5)
nRBC: 0 % (ref 0.0–0.2)

## 2020-10-24 LAB — BASIC METABOLIC PANEL
Anion gap: 7 (ref 5–15)
BUN: 14 mg/dL (ref 8–23)
CO2: 28 mmol/L (ref 22–32)
Calcium: 9.2 mg/dL (ref 8.9–10.3)
Chloride: 106 mmol/L (ref 98–111)
Creatinine, Ser: 0.79 mg/dL (ref 0.44–1.00)
GFR, Estimated: >60 mL/min (ref >60)
Glucose, Bld: 123 mg/dL — ABNORMAL HIGH (ref 70–99)
Potassium: 3.9 mmol/L (ref 3.5–5.1)
Sodium: 141 mmol/L (ref 135–145)

## 2020-10-24 LAB — SARS CORONAVIRUS 2 (TAT 6-24 HRS): SARS Coronavirus 2: NEGATIVE

## 2020-10-24 LAB — SURGICAL PCR SCREEN
MRSA, PCR: NEGATIVE
Staphylococcus aureus: NEGATIVE

## 2020-10-24 LAB — TYPE AND SCREEN
ABO/RH(D): AB POS
Antibody Screen: NEGATIVE

## 2020-10-24 NOTE — Progress Notes (Addendum)
PCP - Dr. Betty Martinique  Chest x-ray - 06/16/20 EKG - 06/16/20 ECHO - 09/13/20  Sleep Study - Denies  DM - Denies  Aspirin Instructions:Stopped Aspirin on June 5th  COVID TEST- 10/24/20  Anesthesia review: No  Patient denies shortness of breath, fever, cough and chest pain at PAT appointment   All instructions explained to the patient, with a verbal understanding of the material. Patient agrees to go over the instructions while at home for a better understanding. Patient also instructed to wear a mask in public after being tested for COVID-19. The opportunity to ask questions was provided.  Patient's surgery time changed to 10/28/20 at 0945. Instructed to arrive 0745 on 10/28/20.

## 2020-10-27 NOTE — Anesthesia Preprocedure Evaluation (Addendum)
Anesthesia Evaluation  Patient identified by MRN, date of birth, ID band Patient awake    Reviewed: Allergy & Precautions, NPO status , Patient's Chart, lab work & pertinent test results, reviewed documented beta blocker date and time   History of Anesthesia Complications Negative for: history of anesthetic complications  Airway Mallampati: II  TM Distance: >3 FB Neck ROM: Full    Dental  (+) Dental Advisory Given, Chipped   Pulmonary Patient abstained from smoking., former smoker,  10/24/2020 SARS coronavirus NEG   breath sounds clear to auscultation       Cardiovascular hypertension, Pt. on medications and Pt. on home beta blockers (-) angina+ dysrhythmias Supra Ventricular Tachycardia  Rhythm:Regular Rate:Normal  09/06/2020 ECHO: EF 60-65%, normal LVF, no significant valvular abnormalities   Neuro/Psych Anxiety vertigo CVA, No Residual Symptoms    GI/Hepatic negative GI ROS, Neg liver ROS,   Endo/Other  Morbid obesity  Renal/GU negative Renal ROS     Musculoskeletal  (+) Arthritis ,   Abdominal (+) + obese,   Peds  Hematology obese   Anesthesia Other Findings   Reproductive/Obstetrics                            Anesthesia Physical Anesthesia Plan  ASA: 2  Anesthesia Plan: General   Post-op Pain Management:    Induction: Intravenous  PONV Risk Score and Plan: Dexamethasone and Scopolamine patch - Pre-op  Airway Management Planned:   Additional Equipment: None  Intra-op Plan:   Post-operative Plan: Extubation in OR  Informed Consent: I have reviewed the patients History and Physical, chart, labs and discussed the procedure including the risks, benefits and alternatives for the proposed anesthesia with the patient or authorized representative who has indicated his/her understanding and acceptance.     Dental advisory given  Plan Discussed with: CRNA and Surgeon  Anesthesia  Plan Comments:        Anesthesia Quick Evaluation

## 2020-10-28 ENCOUNTER — Encounter (HOSPITAL_COMMUNITY)
Admission: RE | Disposition: A | Discharge status: Home Health | Payer: Self-pay | Source: Home / Self Care | Attending: Neurosurgery

## 2020-10-28 ENCOUNTER — Inpatient Hospital Stay (HOSPITAL_COMMUNITY)
Admission: RE | Admit: 2020-10-28 | Discharge: 2020-10-29 | DRG: 472 | Disposition: A | Discharge status: Home Health | Payer: Medicare PPO | Attending: Neurosurgery | Admitting: Neurosurgery

## 2020-10-28 ENCOUNTER — Inpatient Hospital Stay (HOSPITAL_COMMUNITY): Payer: Medicare PPO | Admitting: Physician Assistant

## 2020-10-28 ENCOUNTER — Other Ambulatory Visit: Payer: Self-pay

## 2020-10-28 ENCOUNTER — Inpatient Hospital Stay (HOSPITAL_COMMUNITY): Payer: Medicare PPO

## 2020-10-28 ENCOUNTER — Inpatient Hospital Stay (HOSPITAL_COMMUNITY): Payer: Medicare PPO | Admitting: Anesthesiology

## 2020-10-28 ENCOUNTER — Encounter (HOSPITAL_COMMUNITY): Payer: Self-pay | Admitting: Neurosurgery

## 2020-10-28 DIAGNOSIS — Z880 Allergy status to penicillin: Secondary | ICD-10-CM | POA: Diagnosis not present

## 2020-10-28 DIAGNOSIS — Z87891 Personal history of nicotine dependence: Secondary | ICD-10-CM

## 2020-10-28 DIAGNOSIS — Z7189 Other specified counseling: Secondary | ICD-10-CM | POA: Diagnosis not present

## 2020-10-28 DIAGNOSIS — Z419 Encounter for procedure for purposes other than remedying health state, unspecified: Secondary | ICD-10-CM

## 2020-10-28 DIAGNOSIS — Z7901 Long term (current) use of anticoagulants: Secondary | ICD-10-CM | POA: Diagnosis not present

## 2020-10-28 DIAGNOSIS — M4712 Other spondylosis with myelopathy, cervical region: Principal | ICD-10-CM | POA: Diagnosis present

## 2020-10-28 DIAGNOSIS — M4322 Fusion of spine, cervical region: Secondary | ICD-10-CM | POA: Diagnosis not present

## 2020-10-28 DIAGNOSIS — I4891 Unspecified atrial fibrillation: Secondary | ICD-10-CM | POA: Diagnosis not present

## 2020-10-28 DIAGNOSIS — Z981 Arthrodesis status: Secondary | ICD-10-CM | POA: Diagnosis not present

## 2020-10-28 DIAGNOSIS — Z820 Family history of epilepsy and other diseases of the nervous system: Secondary | ICD-10-CM | POA: Diagnosis not present

## 2020-10-28 DIAGNOSIS — E785 Hyperlipidemia, unspecified: Secondary | ICD-10-CM | POA: Diagnosis not present

## 2020-10-28 DIAGNOSIS — M5000 Cervical disc disorder with myelopathy, unspecified cervical region: Secondary | ICD-10-CM | POA: Diagnosis present

## 2020-10-28 DIAGNOSIS — Z8673 Personal history of transient ischemic attack (TIA), and cerebral infarction without residual deficits: Secondary | ICD-10-CM | POA: Diagnosis not present

## 2020-10-28 DIAGNOSIS — M50021 Cervical disc disorder at C4-C5 level with myelopathy: Secondary | ICD-10-CM | POA: Diagnosis not present

## 2020-10-28 DIAGNOSIS — I480 Paroxysmal atrial fibrillation: Secondary | ICD-10-CM | POA: Diagnosis present

## 2020-10-28 DIAGNOSIS — I10 Essential (primary) hypertension: Secondary | ICD-10-CM | POA: Diagnosis present

## 2020-10-28 DIAGNOSIS — M4802 Spinal stenosis, cervical region: Secondary | ICD-10-CM | POA: Diagnosis present

## 2020-10-28 HISTORY — PX: ANTERIOR CERVICAL DECOMP/DISCECTOMY FUSION: SHX1161

## 2020-10-28 LAB — GLUCOSE, CAPILLARY: Glucose-Capillary: 124 mg/dL — ABNORMAL HIGH (ref 70–99)

## 2020-10-28 LAB — MAGNESIUM: Magnesium: 1.9 mg/dL (ref 1.7–2.4)

## 2020-10-28 LAB — BASIC METABOLIC PANEL
Anion gap: 5 (ref 5–15)
BUN: 14 mg/dL (ref 8–23)
CO2: 32 mmol/L (ref 22–32)
Calcium: 8.9 mg/dL (ref 8.9–10.3)
Chloride: 102 mmol/L (ref 98–111)
Creatinine, Ser: 0.8 mg/dL (ref 0.44–1.00)
GFR, Estimated: >60 mL/min (ref >60)
Glucose, Bld: 151 mg/dL — ABNORMAL HIGH (ref 70–99)
Potassium: 5.1 mmol/L (ref 3.5–5.1)
Sodium: 139 mmol/L (ref 135–145)

## 2020-10-28 LAB — TSH: TSH: 0.607 u[IU]/mL (ref 0.350–4.500)

## 2020-10-28 LAB — ABO/RH: ABO/RH(D): AB POS

## 2020-10-28 SURGERY — ANTERIOR CERVICAL DECOMPRESSION/DISCECTOMY FUSION 3 LEVELS
Anesthesia: General

## 2020-10-28 MED ORDER — PHENYLEPHRINE HCL-NACL 10-0.9 MG/250ML-% IV SOLN
INTRAVENOUS | Status: DC | PRN
  Administered 2020-10-28: 20 ug/min via INTRAVENOUS

## 2020-10-28 MED ORDER — METOPROLOL TARTRATE 25 MG PO TABS
25.0000 mg | ORAL_TABLET | Freq: Three times a day (TID) | ORAL | Status: DC
  Administered 2020-10-28 – 2020-10-29 (×2): 25 mg via ORAL
  Filled 2020-10-28 (×2): qty 1

## 2020-10-28 MED ORDER — SODIUM CHLORIDE 0.9 % IV SOLN: 250.0000 mL | INTRAVENOUS | Status: DC

## 2020-10-28 MED ORDER — THROMBIN 5000 UNITS EX SOLR
CUTANEOUS | Status: AC
  Filled 2020-10-28: qty 5000

## 2020-10-28 MED ORDER — ASPIRIN EC 81 MG PO TBEC
81.0000 mg | DELAYED_RELEASE_TABLET | Freq: Every day | ORAL | Status: DC
  Administered 2020-10-28 – 2020-10-29 (×2): 81 mg via ORAL
  Filled 2020-10-28 (×2): qty 1

## 2020-10-28 MED ORDER — PROPOFOL 10 MG/ML IV BOLUS
INTRAVENOUS | Status: AC
  Filled 2020-10-28: qty 20

## 2020-10-28 MED ORDER — CYCLOBENZAPRINE HCL 10 MG PO TABS
ORAL_TABLET | ORAL | Status: AC
  Filled 2020-10-28: qty 1

## 2020-10-28 MED ORDER — ALBUTEROL SULFATE HFA 108 (90 BASE) MCG/ACT IN AERS
INHALATION_SPRAY | RESPIRATORY_TRACT | Status: DC | PRN
  Administered 2020-10-28: 6 via RESPIRATORY_TRACT

## 2020-10-28 MED ORDER — HYDROMORPHONE HCL 1 MG/ML IJ SOLN
INTRAMUSCULAR | Status: AC
  Filled 2020-10-28: qty 1

## 2020-10-28 MED ORDER — ROCURONIUM BROMIDE 10 MG/ML (PF) SYRINGE
PREFILLED_SYRINGE | INTRAVENOUS | Status: DC | PRN
  Administered 2020-10-28: 20 mg via INTRAVENOUS
  Administered 2020-10-28: 10 mg via INTRAVENOUS
  Administered 2020-10-28: 70 mg via INTRAVENOUS

## 2020-10-28 MED ORDER — HYDROMORPHONE HCL 1 MG/ML IJ SOLN
0.2500 mg | INTRAMUSCULAR | Status: DC | PRN
  Administered 2020-10-28: 0.5 mg via INTRAVENOUS

## 2020-10-28 MED ORDER — PROMETHAZINE HCL 25 MG/ML IJ SOLN: 6.2500 mg | INTRAMUSCULAR | Status: DC | PRN

## 2020-10-28 MED ORDER — CEFAZOLIN SODIUM-DEXTROSE 2-4 GM/100ML-% IV SOLN: 2.0000 g | Freq: Three times a day (TID) | INTRAVENOUS | Status: DC

## 2020-10-28 MED ORDER — METOPROLOL TARTRATE 5 MG/5ML IV SOLN
INTRAVENOUS | Status: DC | PRN
  Administered 2020-10-28 (×3): 1 mg via INTRAVENOUS

## 2020-10-28 MED ORDER — CHLORHEXIDINE GLUCONATE CLOTH 2 % EX PADS: 6.0000 | MEDICATED_PAD | Freq: Once | CUTANEOUS | Status: DC

## 2020-10-28 MED ORDER — SUGAMMADEX SODIUM 200 MG/2ML IV SOLN
INTRAVENOUS | Status: DC | PRN
  Administered 2020-10-28: 200 mg via INTRAVENOUS

## 2020-10-28 MED ORDER — ONDANSETRON HCL 4 MG/2ML IJ SOLN
INTRAMUSCULAR | Status: DC | PRN
  Administered 2020-10-28: 4 mg via INTRAVENOUS

## 2020-10-28 MED ORDER — ALPRAZOLAM 0.25 MG PO TABS: 0.1250 mg | ORAL_TABLET | Freq: Every day | ORAL | Status: DC | PRN

## 2020-10-28 MED ORDER — PROPOFOL 10 MG/ML IV BOLUS
INTRAVENOUS | Status: DC | PRN
  Administered 2020-10-28: 120 mg via INTRAVENOUS

## 2020-10-28 MED ORDER — PHENOL 1.4 % MT LIQD: 1.0000 | OROMUCOSAL | Status: DC | PRN

## 2020-10-28 MED ORDER — DEXAMETHASONE SODIUM PHOSPHATE 10 MG/ML IJ SOLN
INTRAMUSCULAR | Status: AC
  Filled 2020-10-28: qty 1

## 2020-10-28 MED ORDER — ONDANSETRON HCL 4 MG PO TABS: 4.0000 mg | ORAL_TABLET | Freq: Four times a day (QID) | ORAL | Status: DC | PRN

## 2020-10-28 MED ORDER — MEPERIDINE HCL 25 MG/ML IJ SOLN: 6.2500 mg | INTRAMUSCULAR | Status: DC | PRN

## 2020-10-28 MED ORDER — OXYCODONE HCL 5 MG PO TABS
ORAL_TABLET | ORAL | Status: AC
  Filled 2020-10-28: qty 1

## 2020-10-28 MED ORDER — OXYCODONE HCL 5 MG/5ML PO SOLN: 5.0000 mg | Freq: Once | ORAL | Status: AC | PRN

## 2020-10-28 MED ORDER — LACTATED RINGERS IV SOLN: INTRAVENOUS | Status: DC

## 2020-10-28 MED ORDER — MIDAZOLAM HCL 2 MG/2ML IJ SOLN
INTRAMUSCULAR | Status: AC
  Filled 2020-10-28: qty 2

## 2020-10-28 MED ORDER — PANTOPRAZOLE SODIUM 40 MG IV SOLR
40.0000 mg | Freq: Every day | INTRAVENOUS | Status: DC
  Administered 2020-10-28: 40 mg via INTRAVENOUS
  Filled 2020-10-28: qty 40

## 2020-10-28 MED ORDER — DEXMEDETOMIDINE (PRECEDEX) IN NS 20 MCG/5ML (4 MCG/ML) IV SYRINGE
PREFILLED_SYRINGE | INTRAVENOUS | Status: DC | PRN
  Administered 2020-10-28: 8 ug via INTRAVENOUS

## 2020-10-28 MED ORDER — DEXAMETHASONE SODIUM PHOSPHATE 10 MG/ML IJ SOLN
10.0000 mg | Freq: Once | INTRAMUSCULAR | Status: AC
  Administered 2020-10-28: 10 mg via INTRAVENOUS

## 2020-10-28 MED ORDER — LIDOCAINE HCL (PF) 2 % IJ SOLN
INTRAMUSCULAR | Status: AC
  Filled 2020-10-28: qty 5

## 2020-10-28 MED ORDER — EPHEDRINE 5 MG/ML INJ
INTRAVENOUS | Status: AC
  Filled 2020-10-28: qty 10

## 2020-10-28 MED ORDER — THROMBIN 20000 UNITS EX SOLR
CUTANEOUS | Status: DC | PRN
  Administered 2020-10-28: 20 mL via TOPICAL

## 2020-10-28 MED ORDER — ACETAMINOPHEN 650 MG RE SUPP
650.0000 mg | RECTAL | Status: DC | PRN
Start: 2020-10-28 — End: 2020-10-29

## 2020-10-28 MED ORDER — OXYCODONE HCL 5 MG PO TABS
5.0000 mg | ORAL_TABLET | Freq: Once | ORAL | Status: AC | PRN
  Administered 2020-10-28: 5 mg via ORAL

## 2020-10-28 MED ORDER — ONDANSETRON HCL 4 MG/2ML IJ SOLN
INTRAMUSCULAR | Status: AC
  Filled 2020-10-28: qty 2

## 2020-10-28 MED ORDER — FENTANYL CITRATE (PF) 250 MCG/5ML IJ SOLN
INTRAMUSCULAR | Status: DC | PRN
  Administered 2020-10-28 (×2): 50 ug via INTRAVENOUS
  Administered 2020-10-28: 150 ug via INTRAVENOUS

## 2020-10-28 MED ORDER — MENTHOL 3 MG MT LOZG: 1.0000 | LOZENGE | OROMUCOSAL | Status: DC | PRN

## 2020-10-28 MED ORDER — ONDANSETRON HCL 4 MG/2ML IJ SOLN: 4.0000 mg | Freq: Four times a day (QID) | INTRAMUSCULAR | Status: DC | PRN

## 2020-10-28 MED ORDER — CHLORHEXIDINE GLUCONATE 0.12 % MT SOLN
15.0000 mL | Freq: Once | OROMUCOSAL | Status: AC
  Administered 2020-10-28: 15 mL via OROMUCOSAL
  Filled 2020-10-28: qty 15

## 2020-10-28 MED ORDER — 0.9 % SODIUM CHLORIDE (POUR BTL) OPTIME
TOPICAL | Status: DC | PRN
  Administered 2020-10-28: 1000 mL

## 2020-10-28 MED ORDER — ORAL CARE MOUTH RINSE: 15.0000 mL | Freq: Once | OROMUCOSAL | Status: AC

## 2020-10-28 MED ORDER — DIPHENHYDRAMINE HCL 25 MG PO CAPS
25.0000 mg | ORAL_CAPSULE | Freq: Four times a day (QID) | ORAL | Status: DC | PRN
  Filled 2020-10-28 (×2): qty 1

## 2020-10-28 MED ORDER — VANCOMYCIN HCL 1000 MG/200ML IV SOLN
1000.0000 mg | Freq: Two times a day (BID) | INTRAVENOUS | Status: AC
  Administered 2020-10-28 – 2020-10-29 (×2): 1000 mg via INTRAVENOUS
  Filled 2020-10-28 (×2): qty 200

## 2020-10-28 MED ORDER — VANCOMYCIN HCL IN DEXTROSE 1-5 GM/200ML-% IV SOLN
INTRAVENOUS | Status: AC
  Administered 2020-10-28: 1000 mg via INTRAVENOUS
  Filled 2020-10-28: qty 200

## 2020-10-28 MED ORDER — ACETAMINOPHEN 500 MG PO TABS: 1000.0000 mg | ORAL_TABLET | Freq: Once | ORAL | Status: AC

## 2020-10-28 MED ORDER — ALUM & MAG HYDROXIDE-SIMETH 200-200-20 MG/5ML PO SUSP: 30.0000 mL | Freq: Four times a day (QID) | ORAL | Status: DC | PRN

## 2020-10-28 MED ORDER — PAROXETINE HCL 20 MG PO TABS
40.0000 mg | ORAL_TABLET | Freq: Every day | ORAL | Status: DC
  Administered 2020-10-29: 40 mg via ORAL
  Filled 2020-10-28: qty 2

## 2020-10-28 MED ORDER — ACETAMINOPHEN 500 MG PO TABS
ORAL_TABLET | ORAL | Status: AC
  Administered 2020-10-28: 1000 mg via ORAL
  Filled 2020-10-28: qty 2

## 2020-10-28 MED ORDER — SCOPOLAMINE 1 MG/3DAYS TD PT72
MEDICATED_PATCH | TRANSDERMAL | Status: AC
  Administered 2020-10-28: 1.5 mg via TRANSDERMAL
  Filled 2020-10-28: qty 1

## 2020-10-28 MED ORDER — LIDOCAINE HCL (PF) 2 % IJ SOLN
INTRAMUSCULAR | Status: DC | PRN
  Administered 2020-10-28: 20 mg via INTRADERMAL

## 2020-10-28 MED ORDER — ACETAMINOPHEN 325 MG PO TABS
650.0000 mg | ORAL_TABLET | ORAL | Status: DC | PRN
Start: 2020-10-28 — End: 2020-10-29

## 2020-10-28 MED ORDER — SODIUM CHLORIDE 0.9% FLUSH
3.0000 mL | Freq: Two times a day (BID) | INTRAVENOUS | Status: DC
  Administered 2020-10-28 – 2020-10-29 (×2): 3 mL via INTRAVENOUS

## 2020-10-28 MED ORDER — THROMBIN 5000 UNITS EX SOLR
OROMUCOSAL | Status: DC | PRN
  Administered 2020-10-28: 5 mL via TOPICAL

## 2020-10-28 MED ORDER — METOPROLOL TARTRATE 25 MG PO TABS
25.0000 mg | ORAL_TABLET | Freq: Two times a day (BID) | ORAL | Status: DC
  Administered 2020-10-28: 25 mg via ORAL
  Filled 2020-10-28: qty 1

## 2020-10-28 MED ORDER — ROCURONIUM BROMIDE 10 MG/ML (PF) SYRINGE
PREFILLED_SYRINGE | INTRAVENOUS | Status: AC
  Filled 2020-10-28: qty 10

## 2020-10-28 MED ORDER — METOPROLOL SUCCINATE ER 25 MG PO TB24: 25.0000 mg | ORAL_TABLET | Freq: Every day | ORAL | Status: DC

## 2020-10-28 MED ORDER — EPHEDRINE SULFATE-NACL 50-0.9 MG/10ML-% IV SOSY
PREFILLED_SYRINGE | INTRAVENOUS | Status: DC | PRN
  Administered 2020-10-28: 10 mg via INTRAVENOUS

## 2020-10-28 MED ORDER — MIDAZOLAM HCL 2 MG/2ML IJ SOLN
0.5000 mg | Freq: Once | INTRAMUSCULAR | Status: DC | PRN
Start: 2020-10-28 — End: 2020-10-28

## 2020-10-28 MED ORDER — FENTANYL CITRATE (PF) 250 MCG/5ML IJ SOLN
INTRAMUSCULAR | Status: AC
  Filled 2020-10-28: qty 5

## 2020-10-28 MED ORDER — MIDAZOLAM HCL 2 MG/2ML IJ SOLN
INTRAMUSCULAR | Status: DC | PRN
  Administered 2020-10-28: 2 mg via INTRAVENOUS

## 2020-10-28 MED ORDER — THROMBIN 20000 UNITS EX SOLR
CUTANEOUS | Status: AC
  Filled 2020-10-28: qty 20000

## 2020-10-28 MED ORDER — SCOPOLAMINE 1 MG/3DAYS TD PT72: 1.0000 | MEDICATED_PATCH | TRANSDERMAL | Status: DC

## 2020-10-28 MED ORDER — CYCLOBENZAPRINE HCL 10 MG PO TABS
10.0000 mg | ORAL_TABLET | Freq: Three times a day (TID) | ORAL | Status: DC | PRN
  Administered 2020-10-28 – 2020-10-29 (×3): 10 mg via ORAL
  Filled 2020-10-28 (×2): qty 1

## 2020-10-28 MED ORDER — SODIUM CHLORIDE 0.9% FLUSH: 3.0000 mL | INTRAVENOUS | Status: DC | PRN

## 2020-10-28 MED ORDER — VANCOMYCIN HCL IN DEXTROSE 1-5 GM/200ML-% IV SOLN: 1000.0000 mg | INTRAVENOUS | Status: AC

## 2020-10-28 MED ORDER — HYDROMORPHONE HCL 1 MG/ML IJ SOLN: 0.5000 mg | INTRAMUSCULAR | Status: DC | PRN

## 2020-10-28 MED ORDER — OXYCODONE HCL 5 MG PO TABS
10.0000 mg | ORAL_TABLET | ORAL | Status: DC | PRN
  Administered 2020-10-28 – 2020-10-29 (×3): 10 mg via ORAL
  Filled 2020-10-28 (×3): qty 2

## 2020-10-28 SURGICAL SUPPLY — 61 items
BAND RUBBER #18 3X1/16 STRL (MISCELLANEOUS) ×4 IMPLANT
BASKET BONE COLLECTION (BASKET) ×2 IMPLANT
BENZOIN TINCTURE PRP APPL 2/3 (GAUZE/BANDAGES/DRESSINGS) ×2 IMPLANT
BIT DRILL NEURO 2X3.1 SFT TUCH (MISCELLANEOUS) ×1 IMPLANT
BONE VIVIGEN FORMABLE 1.3CC (Bone Implant) ×4 IMPLANT
BUR MATCHSTICK NEURO 3.0 LAGG (BURR) ×2 IMPLANT
CANISTER SUCT 3000ML PPV (MISCELLANEOUS) ×2 IMPLANT
CARTRIDGE OIL MAESTRO DRILL (MISCELLANEOUS) ×1 IMPLANT
COVER WAND RF STERILE (DRAPES) IMPLANT
DECANTER SPIKE VIAL GLASS SM (MISCELLANEOUS) ×2 IMPLANT
DERMABOND ADVANCED (GAUZE/BANDAGES/DRESSINGS) ×1
DERMABOND ADVANCED .7 DNX12 (GAUZE/BANDAGES/DRESSINGS) ×1 IMPLANT
DIFFUSER DRILL AIR PNEUMATIC (MISCELLANEOUS) ×2 IMPLANT
DRAIN JACKSON PRT FLT 7MM (DRAIN) ×2 IMPLANT
DRAPE C-ARM 42X72 X-RAY (DRAPES) ×4 IMPLANT
DRAPE LAPAROTOMY 100X72 PEDS (DRAPES) ×2 IMPLANT
DRAPE MICROSCOPE LEICA (MISCELLANEOUS) ×2 IMPLANT
DRILL NEURO 2X3.1 SOFT TOUCH (MISCELLANEOUS) ×2
DRSG OPSITE POSTOP 4X6 (GAUZE/BANDAGES/DRESSINGS) ×2 IMPLANT
DURAPREP 6ML APPLICATOR 50/CS (WOUND CARE) ×4 IMPLANT
ELECT COATED BLADE 2.86 ST (ELECTRODE) ×2 IMPLANT
ELECT REM PT RETURN 9FT ADLT (ELECTROSURGICAL) ×2
ELECTRODE REM PT RTRN 9FT ADLT (ELECTROSURGICAL) ×1 IMPLANT
EVACUATOR SILICONE 100CC (DRAIN) ×2 IMPLANT
GAUZE 4X4 16PLY RFD (DISPOSABLE) IMPLANT
GAUZE SPONGE 4X4 12PLY STRL (GAUZE/BANDAGES/DRESSINGS) ×2 IMPLANT
GLOVE BIO SURGEON STRL SZ7 (GLOVE) IMPLANT
GLOVE BIO SURGEON STRL SZ8 (GLOVE) ×2 IMPLANT
GLOVE EXAM NITRILE XL STR (GLOVE) IMPLANT
GLOVE INDICATOR 8.5 STRL (GLOVE) ×2 IMPLANT
GLOVE SURG UNDER POLY LF SZ7 (GLOVE) IMPLANT
GOWN STRL REUS W/ TWL LRG LVL3 (GOWN DISPOSABLE) IMPLANT
GOWN STRL REUS W/ TWL XL LVL3 (GOWN DISPOSABLE) ×1 IMPLANT
GOWN STRL REUS W/TWL 2XL LVL3 (GOWN DISPOSABLE) IMPLANT
GOWN STRL REUS W/TWL LRG LVL3 (GOWN DISPOSABLE)
GOWN STRL REUS W/TWL XL LVL3 (GOWN DISPOSABLE) ×2
HALTER HD/CHIN CERV TRACTION D (MISCELLANEOUS) ×2 IMPLANT
HEMOSTAT POWDER KIT SURGIFOAM (HEMOSTASIS) ×2 IMPLANT
KIT BASIN OR (CUSTOM PROCEDURE TRAY) ×2 IMPLANT
KIT TURNOVER KIT B (KITS) ×2 IMPLANT
NEEDLE SPNL 20GX3.5 QUINCKE YW (NEEDLE) ×2 IMPLANT
NS IRRIG 1000ML POUR BTL (IV SOLUTION) ×4 IMPLANT
OIL CARTRIDGE MAESTRO DRILL (MISCELLANEOUS) ×2
PACK LAMINECTOMY NEURO (CUSTOM PROCEDURE TRAY) ×2 IMPLANT
PIN DISTRACTION 14MM (PIN) ×4 IMPLANT
PLATE 3 57.5XLCK NS SPNE CVD (Plate) ×1 IMPLANT
PLATE 3 ATLANTIS TRANS (Plate) ×2 IMPLANT
SCREW ST FIX 4 ATL 3120213 (Screw) ×16 IMPLANT
SPACER HEDRON C 12X14X6 0D (Spacer) ×4 IMPLANT
SPACER HEDRON C 12X14X7 0D (Spacer) ×2 IMPLANT
SPONGE INTESTINAL PEANUT (DISPOSABLE) ×4 IMPLANT
SPONGE SURGIFOAM ABS GEL 100 (HEMOSTASIS) ×2 IMPLANT
STRIP CLOSURE SKIN 1/2X4 (GAUZE/BANDAGES/DRESSINGS) ×2 IMPLANT
SUT VIC AB 3-0 SH 8-18 (SUTURE) ×4 IMPLANT
SUT VIC AB 4-0 PS2 27 (SUTURE) ×2 IMPLANT
SYR CONTROL 10ML LL (SYRINGE) ×2 IMPLANT
TAPE CLOTH 4X10 WHT NS (GAUZE/BANDAGES/DRESSINGS) ×2 IMPLANT
TOWEL GREEN STERILE (TOWEL DISPOSABLE) ×2 IMPLANT
TOWEL GREEN STERILE FF (TOWEL DISPOSABLE) ×2 IMPLANT
TRAY FOLEY MTR SLVR 16FR STAT (SET/KITS/TRAYS/PACK) ×2 IMPLANT
WATER STERILE IRR 1000ML POUR (IV SOLUTION) ×2 IMPLANT

## 2020-10-28 NOTE — Progress Notes (Signed)
Paged by RN ~3:30 pm that Afib is now not well rate-controlled in the 130-150s. Pt asymptomatic. Torpol was not yet given. I switched BB to lopressor 25 mg BID for ease of titration.   RN notified that rate was primarily in the 110s, but still popping up in to the 140-160s. Pt asymptomatic.  BB increased to TID with hold parameters.   Increased rates in the post-operative period can be expected. I would like to leave pressure room for pain management and avoid hypotension. Echo with normal EF. May use cardizem drip if needed for rate control and if she has adequate pressure, but prefer BB for now. Will avoid antiarrhythmics as she is not anticoagulated.   Please page back if she becomes symptomatic or HR sustaining above 150s.   Tami Lin Benito Lemmerman, PA-C 10/28/2020, 4:57 PM

## 2020-10-28 NOTE — Anesthesia Procedure Notes (Signed)
Procedure Name: Intubation Date/Time: 10/28/2020 7:47 AM Performed by: Donnelly Angelica, RN Pre-anesthesia Checklist: Patient identified, Emergency Drugs available, Suction available and Patient being monitored Patient Re-evaluated:Patient Re-evaluated prior to induction Oxygen Delivery Method: Circle system utilized Preoxygenation: Pre-oxygenation with 100% oxygen Induction Type: IV induction Ventilation: Mask ventilation without difficulty Laryngoscope Size: Glidescope and 4 Grade View: Grade I Tube type: Oral Tube size: 7.0 mm Number of attempts: 1 Airway Equipment and Method: Oral airway, Rigid stylet and Video-laryngoscopy Placement Confirmation: ETT inserted through vocal cords under direct vision, positive ETCO2 and breath sounds checked- equal and bilateral Secured at: 22 cm Tube secured with: Tape Dental Injury: Teeth and Oropharynx as per pre-operative assessment

## 2020-10-28 NOTE — H&P (Signed)
Glenda [name] is an 70 y.o. adult.   Chief Complaint: Balance difficulty walking numbness tingling weakness in her hands HPI: 70 year old female progressive worsening numbness ting weakness in her hands difficulty walking and balance difficulty.  Work-up overall revealed severe spinal cord compression in her neck at C5-6 and C6-7 as well as at C4-5 with foraminal stenosis at all levels.  Due to the patient's progression of clinical syndrome imaging findings and failed conservative treatment I recommended anterior cervical discectomies and fusion at those 3 levels.  I have extensively gone over the risks and benefits of the operation with her as well as perioperative course expectations of outcome and alternatives to surgery and she understands and agrees to proceed forward.  Past Medical History:  Diagnosis Date   Allergic rhinitis    Allergy    Anxiety    Arthritis    Cataract    Heart murmur    History of colonic polyps    History of PSVT (paroxysmal supraventricular tachycardia)    Hypertension    Impaired glucose tolerance    Obesity    PAT (paroxysmal atrial tachycardia) ([facility])    Pre-diabetes    hx, not currently being monitor for diabetes, diet controlled   Stroke ([place]) 08/2020   Scan revealed stroke in history   Tobacco user    Vertigo    started 3 weeks ago- after fluid trapped in ear    Past Surgical History:  Procedure Laterality Date   ankle sx-left fx     CATARACT EXTRACTION     both eyes   CHOLECYSTECTOMY N/A 01/11/2018   Procedure: LAPAROSCOPIC CHOLECYSTECTOMY WITH INTRAOPERATIVE CHOLANGIOGRAM ERAS PATHWAY;  Surgeon: Alphonsa Overall, MD;  Location: [facility];  Service: General;  Laterality: N/A;   COLONOSCOPY  04/23/2005   DIAGNOSTIC LAPAROSCOPY     EYE SURGERY     foot sx     with the ankle surgery   POLYPECTOMY      Family History  Problem Relation Age of Onset   Healthy Mother    Dementia Father    Healthy Sister    Healthy Sister    Alzheimer's disease  Other    Colon cancer Neg Hx    Esophageal cancer Neg Hx    Rectal cancer Neg Hx    Stomach cancer Neg Hx    Colon polyps Neg Hx    Social History:  reports that she quit smoking 11 days ago. Her smoking use included cigarettes. She smoked an average of 0.50 packs per day. She has never used smokeless tobacco. She reports current alcohol use. She reports that she does not use drugs.  Allergies:  Allergies  Allergen Reactions   Amoxicillin Rash and Other (See Comments)    Has patient had a PCN reaction causing immediate rash, facial/tongue/throat swelling, SOB or lightheadedness with hypotension: No HAS PT DEVELOPED SEVERE RASH INVOLVING MUCUS MEMBRANES or SKIN NECROSIS: #  #  YES  #  #  Has patient had a PCN reaction that required hospitalization: No Has patient had a PCN reaction occurring within the last 10 years: No    Erythromycin Other (See Comments)    Upset stomach    Medications Prior to Admission  Medication Sig Dispense Refill   ALPRAZolam (XANAX) 0.25 MG tablet Take 1 tablet (0.25 mg total) by mouth daily as needed for anxiety. (Patient taking differently: Take 0.125-0.25 mg by mouth daily as needed for anxiety.) 30 tablet 0   aspirin EC 81 MG tablet Take 81  mg by mouth daily.     ibuprofen (ADVIL,MOTRIN) 200 MG tablet Take 600 mg by mouth 2 (two) times daily as needed (for pain.).     metoprolol succinate (TOPROL-XL) 25 MG 24 hr tablet TAKE 1 TABLET BY MOUTH EVERY DAY (Patient taking differently: Take 25 mg by mouth daily.) 90 tablet 1   PARoxetine (PAXIL) 40 MG tablet TAKE 1 TABLET BY MOUTH EVERY DAY IN THE MORNING (Patient taking differently: Take 40 mg by mouth daily.) 90 tablet 3   diphenhydrAMINE (BENADRYL) 25 MG tablet Take 25 mg by mouth every 6 (six) hours as needed for allergies.      Results for orders placed or performed during the hospital encounter of 10/28/20 (from the past 48 hour(s))  ABO/Rh     Status: None (Preliminary result)   Collection Time:  10/28/20  6:16 AM  Result Value Ref Range   ABO/RH(D) PENDING    No results found.  Review of Systems  Musculoskeletal:  Positive for gait problem and neck pain.  Neurological:  Positive for weakness and numbness.   Blood pressure 136/65, pulse 74, temperature 98.2 F (36.8 C), temperature source Oral, resp. rate 18, height 5\' 7"  (1.702 m), weight 97.2 kg, SpO2 96 %. Physical Exam HENT:     Head: Normocephalic.     Right Ear: Tympanic membrane normal.     Nose: Nose normal.  Eyes:     Pupils: Pupils are equal, round, and reactive to light.  Cardiovascular:     Rate and Rhythm: Normal rate.  Pulmonary:     Effort: Pulmonary effort is normal.  Abdominal:     General: Abdomen is flat.  Musculoskeletal:        General: Normal range of motion.  Neurological:     Mental Status: She is alert.     Comments: Patient is awake alert strength is 5/5 deltoid, bicep, tricep, wrist flexion, wrist extension, hand intrinsics.  Lower extremity strength 5 out of 5     Assessment/Plan 70 year old presents for ACDF C4-5, C5-6, C6-7.  Glenda Hoops, MD 10/28/2020, 7:08 AM

## 2020-10-28 NOTE — Op Note (Signed)
Preoperative diagnosis: Cervical spondylitic myelopathy from severe cervical stenosis C4-5, C5-6, C6-7  Postoperative diagnosis: Same  Procedure: #1 anterior cervical discectomies and fusion at C4-5, C5-6, C6-7 utilizing globus hedron titanium peek cages packed with locally harvested autograft mixed with vivigen  2.  Cervical plating utilizing Atlantis translational plating system from C4-C7  Surgeon: Glenda [name]  Assistant: Nash Shearer  Anesthesia: General  EBL: Minimal  HPI: 70 year old female progressive worsening neck and bilateral shoulder and arm pain with numbness and tingling in her hands and difficulty walking.  Work-up revealed severe cervical stenosis with cord compression primarily at C5-6 and C6-7 but also right-sided spurring at C4-5.  Due to patient's progression of clinical syndrome imaging findings and failed conservative treatment I recommended anterior cervical discectomies and fusion at those 3 levels.  I extensively went over the risks and benefits of the operation with her as well as perioperative course expectations of outcome and alternatives of surgery and she understood and agreed to proceed forward.  Operative procedure: Patient was brought into the OR was [facility] general anesthesia positioned supine the neck in slight extension 5 pounds halter traction the right 7 neck was prepped and draped in routine sterile fashion.  Preoperative x-ray localized the appropriate level so curvilinear incision was made from the midline to the anteverted of sternocleidomastoid and the superficial layer of the platysma was dissected out divided longitudinally.  The avascular plane between the sternomastoid and strap muscle was developed down to the prevertebral fascia improved vascular dissect away with Kitners.  Intraoperative x-ray confirmed defecation appropriate level.  So anterior osteophytes were bitten off with a Leksell rongeur and a 3 Miller Kerrison punch longus was reflected  laterally and self-retaining retractors were placed.  All 3 disc bases were drilled down with a high-speed drill capturing the bone shavings and mucus trap.  Under microscope illumination first working at C6-7 further drilling down the posterior annulus and osteophytic complex identified large disc radiation left to midline and extensive mount of spurring coming off the uncinate as well as both endplates.  All this was aggressively under Bitton decompressing central canal and both C7 neuroforamina.  Both C7 nerves are decompressed and skeletonized flush with pedicle.  This was then packed with Gelfoam tension taken at C5-6.  In a similar fashion pathology here was bilateral marked uncinate vertically severe subligamentous disc herniation this was all aggressively removed decompressing both C6 nerve roots and thecal sac with removal of the posterior longitudinal ligament.  Then this was packed with Gelfoam and C45 the onset of her to be on the right but this was all removed decompressing both C5 nerve roots removal of the posterior longitudinal ligament piecemeal fashion decompressing central canal with aggressive under biting both endplates.  After adequate discectomy achieved all 3 levels sized up the cages packed with locally harvested autograft mix with 6 mm cage at C4-5 and C5-6 and 7 mm C6-7.  Then I selected a 57-1/2 Atlantis translational plate all screws and excellent purchase locking mechanisms were gauged and postop fluoroscopy confirmed everything to be in good position.  I packed some additional bone graft laterally to each cage and underneath the plate.  Then placed a JP drain and meticulous hemostasis was maintained the wound was copiously irrigated and closed with interrupted Vicryl and a running 4 subcuticular Dermabond benzoin Steri-Strips and a sterile dressing was applied patient recovery in stable condition.  At the end the case all needle count sponge counts were correct.

## 2020-10-28 NOTE — Consult Note (Signed)
Cardiology Consultation:   Patient ID: Glenda [name] MRN: [id number]; DOB: 1951/04/03  Admit date: 10/28/2020 Date of Consult: 10/28/2020  PCP:  Martinique, Betty G, MD   [facility] Prov[facility] Cardiologist:  Buford Dresser, MD new   Patient Profile:   Glenda [name] is a 70 y.o. adult with a hx of HTN, PSVT, PAT, anxiety, obesity, pre-diabetes, and tobacco use who is being seen 10/28/2020 for the evaluation of Afib at the request of Dr. Saintclair Halsted.  History of Present Illness:   Ms. Kott was evaluated in the ER 06/16/20 for an episode of near syncope. She was hypotensive in the 80s, but normalized when EMS arrived. Carotid US showed mild nonobstructive disease. Echo showed EF 60-65%, no RWMA, and LVH. Echo tech noted irregular rhythm with no clear A-wave concerning for Afib. Does  not appear heart monitor was ordered or that she was started on DOAC at that time. She is on toprol for history of PSVT.   She presented today to [facility] for scheduled back surgery. Workup for numbness and tingling in hands showed severe spinal cord compression in her neck. She failed conservative treatment and underwent anterior cervical discectomies and fusion at C4-5, C5-6 and C6-7. In the PACU, she was noted to have rapid heart rate thought to be Afib. 12-lead pending. Cardiology was consulted.    During my interview, she is still sedated from anesthesia. Husband is at bedside and helps with history. She recalls having "some" Afib on EKG in Jan 2020 - I do not see this. She does not recall being told she needed to be anticoagulated. She is completely unaware of her Afib. Head imaging as part of her neuro workup does show an area of prior stroke, no residual deficits. She denies chest pain.   She is retired from [employer] in [employer]. She is a smoker, quit last week.    Past Medical History:  Diagnosis Date   Allergic rhinitis    Allergy    Anxiety    Arthritis    Cataract    Heart murmur    History of  colonic polyps    History of PSVT (paroxysmal supraventricular tachycardia)    Hypertension    Impaired glucose tolerance    Obesity    PAT (paroxysmal atrial tachycardia) (HCC)    Pre-diabetes    hx, not currently being monitor for diabetes, diet controlled   Stroke ([place]) 08/2020   Scan revealed stroke in history   Tobacco user    Vertigo    started 3 weeks ago- after fluid trapped in ear    Past Surgical History:  Procedure Laterality Date   ankle sx-left fx     CATARACT EXTRACTION     both eyes   CHOLECYSTECTOMY N/A 01/11/2018   Procedure: LAPAROSCOPIC CHOLECYSTECTOMY WITH INTRAOPERATIVE CHOLANGIOGRAM ERAS PATHWAY;  Surgeon: Alphonsa Overall, MD;  Location: [facility];  Service: General;  Laterality: N/A;   COLONOSCOPY  04/23/2005   DIAGNOSTIC LAPAROSCOPY     EYE SURGERY     foot sx     with the ankle surgery   POLYPECTOMY       Home Medications:  Prior to Admission medications   Medication Sig Start Date End Date Taking? Authorizing Provider  ALPRAZolam (XANAX) 0.25 MG tablet Take 1 tablet (0.25 mg total) by mouth daily as needed for anxiety. Patient taking differently: Take 0.125-0.25 mg by mouth daily as needed for anxiety. 10/07/20  Yes [place]ique, Betty G, MD  aspirin EC 81 MG tablet  Take 81 mg by mouth daily.   Yes [provider]  ibuprofen (ADVIL,MOTRIN) 200 MG tablet Take 600 mg by mouth 2 (two) times daily as needed (for pain.).   Yes [provider]  metoprolol succinate (TOPROL-XL) 25 MG 24 hr tablet TAKE 1 TABLET BY MOUTH EVERY DAY Patient taking differently: Take 25 mg by mouth daily. 05/23/20  Yes Martinique, Betty G, MD  PARoxetine (PAXIL) 40 MG tablet TAKE 1 TABLET BY MOUTH EVERY DAY IN THE MORNING Patient taking differently: Take 40 mg by mouth daily. 02/19/20  Yes [place]ique, Betty G, MD  diphenhydrAMINE (BENADRYL) 25 MG tablet Take 25 mg by mouth every 6 (six) hours as needed for allergies.    [provider]    Inpatient Medications: Scheduled  Meds:  aspirin EC  81 mg Oral Daily   cyclobenzaprine       HYDROmorphone       [START ON 10/29/2020] metoprolol succinate  25 mg Oral Daily   oxyCODONE       pantoprazole (PROTONIX) IV  40 mg Intravenous QHS   [START ON 10/29/2020] PARoxetine  40 mg Oral Daily   sodium chloride flush  3 mL Intravenous Q12H   Continuous Infusions:  sodium chloride     vancomycin     PRN Meds: acetaminophen **OR** acetaminophen, ALPRAZolam, alum & mag hydroxide-simeth, cyclobenzaprine, diphenhydrAMINE, HYDROmorphone (DILAUDID) injection, menthol-cetylpyridinium **OR** phenol, ondansetron **OR** ondansetron (ZOFRAN) IV, oxyCODONE, sodium chloride flush  Allergies:    Allergies  Allergen Reactions   Amoxicillin Rash and Other (See Comments)    Has patient had a PCN reaction causing immediate rash, facial/tongue/throat swelling, SOB or lightheadedness with hypotension: No HAS PT DEVELOPED SEVERE RASH INVOLVING MUCUS MEMBRANES or SKIN NECROSIS: #  #  YES  #  #  Has patient had a PCN reaction that required hospitalization: No Has patient had a PCN reaction occurring within the last 10 years: No    Erythromycin Other (See Comments)    Upset stomach    Social History:   Social History   Socioeconomic History   Marital status: Married    Spouse name: Not on file   Number of children: 2   Years of education: college   Highest education level: Not on file  Occupational History   Occupation: Retired  Tobacco Use   Smoking status: Former    Packs/day: 0.50    Pack years: 0.00    Types: Cigarettes    Quit date: 10/17/2020    Years since quitting: 0.0   Smokeless tobacco: Never  Vaping Use   Vaping Use: Never used  Substance and Sexual Activity   Alcohol use: Yes    Comment: weekends   Drug use: No   Sexual activity: Yes  Other Topics Concern   Not on file  Social History Narrative   Lives at home with her husband.   Right-handed.   One cup caffeine per day.   Social Determinants of Health    Financial Resource Strain: Not on file  Food Insecurity: Not on file  Transportation Needs: Not on file  Physical Activity: Not on file  Stress: Not on file  Social Connections: Not on file  Intimate Partner Violence: Not on file    Family History:    Family History  Problem Relation Age of Onset   Healthy Mother    Dementia Father    Healthy Sister    Healthy Sister    Alzheimer's disease Other    Colon cancer Neg  Hx    Esophageal cancer Neg Hx    Rectal cancer Neg Hx    Stomach cancer Neg Hx    Colon polyps Neg Hx      ROS:  Please see the history of present illness.   All other ROS reviewed and negative.     Physical Exam/Data:   Vitals:   10/28/20 1152 10/28/20 1207 10/28/20 1220 10/28/20 1246  BP: 113/71 118/69 118/69   Pulse: 78 79 81   Resp: 16 (!) 25 16   Temp:   98.2 F (36.8 C) 98.5 F (36.9 C)  TempSrc:    Oral  SpO2: 92% 90% 94%   Weight:      Height:        Intake/Output Summary (Last 24 hours) at 10/28/2020 1325 Last data filed at 10/28/2020 1230 Gross per 24 hour  Intake 1170 ml  Output 360 ml  Net 810 ml   Last 3 Weights 10/28/2020 10/24/2020 09/26/2020  Weight (lbs) 214 lb 3.2 oz 214 lb 3.2 oz 216 lb  Weight (kg) 97.16 kg 97.16 kg 97.977 kg     Body mass index is 33.55 kg/m.  General:  resting flat, C collar in place, mildly sedated Neck: no JVD Vascular: No carotid bruits  Cardiac:  irregular rhythm, regular rate, no murmur Lungs:  clear to auscultation bilaterally, no wheezing, rhonchi or rales  Abd: soft, nontender, no hepatomegaly  Ext: no edema Musculoskeletal:  No deformities, BUE and BLE strength normal and equal Skin: warm and dry  Neuro:  CNs 2-12 intact, no focal abnormalities noted Psych:  Normal affect   EKG:  The EKG was personally reviewed and demonstrates:  atrial fibrillation with ventricular rate 73 Telemetry:  Telemetry was personally reviewed and demonstrates:  Afib 80-90s  Relevant CV Studies:  Echo  09/13/20:  1. Left ventricular ejection fraction, by estimation, is 60 to 65%. The  left ventricle has normal function. The left ventricle has no regional  wall motion abnormalities. There is mild left ventricular hypertrophy.  Left ventricular diastolic parameters  are indeterminate.   2. Right ventricular systolic function is normal. The right ventricular  size is normal. Tricuspid regurgitation signal is inadequate for assessing  PA pressure.   3. The mitral valve is normal in structure. No evidence of mitral valve  regurgitation.   4. The aortic valve was not well visualized. Aortic valve regurgitation  is not visualized. No aortic stenosis is present.   5. The inferior vena cava is normal in size with greater than 50%  respiratory variability, suggesting right atrial pressure of 3 mmHg.   6. Rhythm is irregular and no clear A-wave seen on mitral inflow Doppler,  concerning for atrial fibrillation. Recommend evaluating for Afib with  cardiac monitor   Laboratory Data:  High Sensitivity Troponin:  No results for input(s): TROPONINIHS in the last 720 hours.   Chemistry Recent Labs  Lab 10/24/20 1000  NA 141  K 3.9  CL 106  CO2 28  GLUCOSE 123*  BUN 14  CREATININE 0.79  CALCIUM 9.2  GFRNONAA >60  ANIONGAP 7    No results for input(s): PROT, ALBUMIN, AST, ALT, ALKPHOS, BILITOT in the last 168 hours. Hematology Recent Labs  Lab 10/24/20 1000  WBC 6.6  RBC 4.68  HGB 14.7  HCT 45.6  MCV 97.4  MCH 31.4  MCHC 32.2  RDW 12.9  PLT 293   BNPNo results for input(s): BNP, PROBNP in the last 168 hours.  DDimer No results  for input(s): DDIMER in the last 168 hours.   Radiology/Studies:  DG Cervical Spine 1 View  Result Date: 10/28/2020 CLINICAL DATA:  C4-C7 ACDF. EXAM: DG C-ARM 1-60 MIN; DG CERVICAL SPINE - 1 VIEW FLUOROSCOPY TIME:  Fluoroscopy Time:  10.9 seconds. Radiation Exposure Index (if provided by the fluoroscopic device): 3.58 mGy. Number of Acquired Spot  Images: 2 COMPARISON:  MRI September 01, 2020. FINDINGS: Two C-arm fluoroscopic images were obtained intraoperatively and submitted for post operative interpretation. These images demonstrate anterior plate and screws spanning C4 through C7. Intervening spacers at C4-C5, C5-C6, and C6-C7. Gauze projects anteriorly. Please see the performing provider's procedural report for further detail. IMPRESSION: C4-C7 ACDF. Electronically Signed   By: Margaretha Sheffield MD   On: 10/28/2020 10:49   DG C-Arm 1-60 Min  Result Date: 10/28/2020 CLINICAL DATA:  C4-C7 ACDF. EXAM: DG C-ARM 1-60 MIN; DG CERVICAL SPINE - 1 VIEW FLUOROSCOPY TIME:  Fluoroscopy Time:  10.9 seconds. Radiation Exposure Index (if provided by the fluoroscopic device): 3.58 mGy. Number of Acquired Spot Images: 2 COMPARISON:  MRI September 01, 2020. FINDINGS: Two C-arm fluoroscopic images were obtained intraoperatively and submitted for post operative interpretation. These images demonstrate anterior plate and screws spanning C4 through C7. Intervening spacers at C4-C5, C5-C6, and C6-C7. Gauze projects anteriorly. Please see the performing provider's procedural report for further detail. IMPRESSION: C4-C7 ACDF. Electronically Signed   By: Margaretha Sheffield MD   On: 10/28/2020 10:49     Assessment and Plan:   Paroxysmal atrial fibrillation - EKG with rate controlled Afib, ventricular rate 73 - echo in April with normal sized atria, in Afib - rate controlled - continue home BB - if rates increase, consider transitioning to lopressor and titrate for rate control - I do not think this is a new diagnosis, just newly recognized -will check Mg and TSH - may consider OP DCCV after at least three weeks of anticoagulation   Need for chronic anticoagulation This patients CHA2DS2-VASc Score and unadjusted Ischemic Stroke Rate (% per year) is equal to 9.7 % stroke rate/year from a score of 75 (age, female, carotid artery disease, CVA, HTN) - will need to review  with surgeon when she can be anticoagulated - when OK per neurosurgery, will need to start anticoagulation with 5 mg eliquis BID - will await their recommendations   PSVT - prior diagnosis - continue toprol   Disordered sleeping Snoring - will need an outpatient sleep study   Hypertension - controlled with BB   I will arrange cardiology follow up once anticoagulation recommendations are clear.   Risk Assessment/Risk Scores:  { CHA2DS2-VASc Score = 6 This indicates a 9.7% annual risk of stroke. The patient's score is based upon: CHF History: No HTN History: Yes Diabetes History: No Stroke History: Yes Vascular Disease History: Yes Age Score: 1 Gender Score: 1   For questions or updates, please contact [facility] Please consult [email or web address] for contact info under    Signed, Ledora Bottcher, PA  10/28/2020 1:25 PM

## 2020-10-28 NOTE — Transfer of Care (Signed)
Immediate Anesthesia Transfer of Care Note  Patient: Glenda [name]  Procedure(s) Performed: Anterior Cervical Decompression Fusion - Cervical Four-Cervical Five - Cervical Five-Cervical Six - Cervical Six- Cervical Seven  Patient Location: PACU  Anesthesia Type:General  Level of Consciousness: drowsy  Airway & Oxygen Therapy: Patient Spontanous Breathing and Patient connected to face mask oxygen  Post-op Assessment: Report given to RN and Post -op Vital signs reviewed and stable  Post vital signs: Reviewed and stable  Last Vitals:  Vitals Value Taken Time  BP 143/66 10/28/20 1037  Temp    Pulse 70 10/28/20 1041  Resp 24 10/28/20 1041  SpO2 100 % 10/28/20 1041  Vitals shown include unvalidated device data.  Last Pain:  Vitals:   10/28/20 0614  TempSrc: Oral  PainSc:          Complications: No notable events documented.

## 2020-10-28 NOTE — Anesthesia Postprocedure Evaluation (Signed)
Anesthesia Post Note  Patient: Glenda [name]  Procedure(s) Performed: Anterior Cervical Decompression Fusion - Cervical Four-Cervical Five - Cervical Five-Cervical Six - Cervical Six- Cervical Seven     Patient location during evaluation: PACU Anesthesia Type: General Level of consciousness: awake and alert, patient cooperative and oriented Pain management: pain level controlled Vital Signs Assessment: post-procedure vital signs reviewed and stable Respiratory status: spontaneous breathing, nonlabored ventilation and respiratory function stable Cardiovascular status: blood pressure returned to baseline and stable Postop Assessment: no apparent nausea or vomiting Anesthetic complications: no Comments: Pt with recurrent Afib periop, HR and BP stable. Cardiology to eval while pt hospitalized    No notable events documented.  Last Vitals:  Vitals:   10/28/20 1246 10/28/20 1247  BP:  118/64  Pulse:  78  Resp:    Temp: 36.9 C   SpO2:  95%    Last Pain:  Vitals:   10/28/20 1601  TempSrc:   PainSc: Asleep                 Ilay Capshaw,E. Aurilla Coulibaly

## 2020-10-29 ENCOUNTER — Encounter (HOSPITAL_COMMUNITY): Payer: Self-pay | Admitting: Neurosurgery

## 2020-10-29 DIAGNOSIS — Z7189 Other specified counseling: Secondary | ICD-10-CM

## 2020-10-29 MED ORDER — METOPROLOL SUCCINATE ER 25 MG PO TB24: 1.0000 | ORAL_TABLET | Freq: Two times a day (BID) | ORAL | 3 refills | Status: DC

## 2020-10-29 MED ORDER — OXYCODONE HCL 10 MG PO TABS: 10.0000 mg | ORAL_TABLET | ORAL | 0 refills | Status: DC | PRN

## 2020-10-29 MED ORDER — PAROXETINE HCL 40 MG PO TABS: 40.0000 mg | ORAL_TABLET | Freq: Every day | ORAL | 0 refills | Status: DC

## 2020-10-29 MED ORDER — ALPRAZOLAM 0.25 MG PO TABS: 0.1250 mg | ORAL_TABLET | Freq: Every day | ORAL | 0 refills | Status: DC | PRN

## 2020-10-29 MED ORDER — APIXABAN 5 MG PO TABS: 5.0000 mg | ORAL_TABLET | Freq: Two times a day (BID) | ORAL | 3 refills | Status: DC

## 2020-10-29 MED ORDER — METOPROLOL TARTRATE 25 MG PO TABS: 25.0000 mg | ORAL_TABLET | Freq: Three times a day (TID) | ORAL | 0 refills | Status: DC

## 2020-10-29 NOTE — Evaluation (Signed)
Physical Therapy Evaluation Patient Details Name: Glenda [name] MRN: [id number] DOB: [birth year] Today's Date: 10/29/2020   History of Present Illness  Pt is 70yo female admitted for elective ACDF at C4-5, C5-6, and C6-7 due to numbness and weakness down bilat UEs and LEs due to spinal corad compression. PMH" afib, vertigo, anxiety, cataract, HTN, PSH: chole, eye surgery   Clinical Impression  Patient is s/p above surgery resulting in the deficits listed below (see PT Problem List). Pt tolerating mobility well for first time up but did have noted instability and and episode of LOB requiring modA to prevent fall. Patient will benefit from skilled PT to increase their independence and safety with mobility (while adhering to their precautions) to allow discharge to the venue listed below.     Follow Up Recommendations Home health PT;Supervision/Assistance - 24 hour (to progress balance, strengthening and progress towards ambulation without AD)    Equipment Recommendations  Rolling walker with 5" wheels;3in1 (PT)    Recommendations for Other Services       Precautions / Restrictions Precautions Precautions: Cervical Precaution Booklet Issued: Yes (comment) Precaution Comments: pt and spouse present, pt with good verbal understanding but requiring reminders to adhere functionally Required Braces or Orthoses: Cervical Brace Cervical Brace: Soft collar Restrictions Weight Bearing Restrictions: No      Mobility  Bed Mobility Overal bed mobility: Needs Assistance Bed Mobility: Rolling;Sidelying to Sit;Sit to Sidelying Rolling: Min guard Sidelying to sit: Min guard     Sit to sidelying: Min guard General bed mobility comments: HOB elevated, pt reports "I do have a recliner I might sleep in" verbal cues for log roll technique    Transfers Overall transfer level: Needs assistance Equipment used: Rolling walker (2 wheeled) Transfers: Sit to/from Stand Sit to Stand: Min guard          General transfer comment: verbal cues for hand placement, increased time, increased trunk flexion despite max verbal cues  Ambulation/Gait Ambulation/Gait assistance: Min guard Gait Distance (Feet): 200 Feet (100x2) Assistive device: Rolling walker (2 wheeled) Gait Pattern/deviations: Step-through pattern;Decreased stride length Gait velocity: dec Gait velocity interpretation: 1.31 - 2.62 ft/sec, indicative of limited community ambulator General Gait Details: began without AD however pt reaching with bilat UEs to hold onto furniture of hallways rail. Pt with LOB when attempting to turn and look over L shld requiring modA to prevent fall  Stairs Stairs: Yes Stairs assistance: Min guard Stair Management: One rail Left;Step to pattern;Forwards Number of Stairs: 2 General stair comments: educated pt and spouse on how spouse is to support pt on 2 STE house without hand rail, spouse with good return demo, pt completed 2 steps with L handrail without physical assist  Wheelchair Mobility    Modified Rankin (Stroke Patients Only)       Balance Overall balance assessment: Mild deficits observed, not formally tested (pt does required RW for safe ambulation)                                           Pertinent Vitals/Pain Pain Assessment: 0-10 Pain Score: 1  Pain Location: surgical site Pain Descriptors / Indicators: Guarding Pain Intervention(s): Monitored during session    Home Living Family/patient expects to be discharged to:: Private residence Living Arrangements: Spouse/significant other Available Help at Discharge: Family;Available 24 hours/day Type of Home: House Home Access: Stairs to enter Entrance Stairs-Rails: None Entrance [employer]  of Steps: 2 Home Layout: Two level Home Equipment: Shower seat      Prior Function Level of Independence: Independent         Comments: pt didn't use AD for ambulation but reports frequent falls over the  last few months due to bilat LEs giving out     Hand Dominance   Dominant Hand: Right    Extremity/Trunk Assessment   Upper Extremity Assessment Upper Extremity Assessment: LUE deficits/detail LUE Deficits / Details: grossly 4/5 compared to R UE LUE Sensation: decreased light touch    Lower Extremity Assessment Lower Extremity Assessment: LLE deficits/detail LLE Deficits / Details: grossly 4/5 compared to R LE LLE Sensation: decreased light touch    Cervical / Trunk Assessment Cervical / Trunk Assessment: Other exceptions Cervical / Trunk Exceptions: recent neck surgery  Communication   Communication: No difficulties  Cognition Arousal/Alertness: Awake/alert Behavior During Therapy: WFL for tasks assessed/performed Overall Cognitive Status: Impaired/Different from baseline Area of Impairment: Memory                     Memory: Decreased recall of precautions         General Comments: pt with difficulty adhering to cervical precautions      General Comments General comments (skin integrity, edema, etc.): pt with some drainage on dressing at anterior neck    Exercises Other Exercises Other Exercises: instructed on isometric abdominal contractions to support self during turns, ambulation, and stair negotiation   Assessment/Plan    PT Assessment Patient needs continued PT services  PT Problem List Decreased strength;Decreased range of motion;Decreased activity tolerance;Decreased balance;Decreased mobility;Decreased coordination;Decreased cognition;Decreased knowledge of use of DME       PT Treatment Interventions DME instruction;Stair training;Gait training;Balance training;Neuromuscular re-education;Therapeutic exercise;Therapeutic activities;Functional mobility training    PT Goals (Current goals can be found in the Care Plan section)  Acute Rehab PT Goals Patient Stated Goal: home PT Goal Formulation: With patient Time For Goal Achievement:  11/12/20 Potential to Achieve Goals: Good    Frequency Min 5X/week   Barriers to discharge        Co-evaluation               AM-PAC PT "6 Clicks" Mobility  Outcome Measure Help needed turning from your back to your side while in a flat bed without using bedrails?: A Little Help needed moving from lying on your back to sitting on the side of a flat bed without using bedrails?: A Little Help needed moving to and from a bed to a chair (including a wheelchair)?: A Little Help needed standing up from a chair using your arms (e.g., wheelchair or bedside chair)?: A Little Help needed to walk in hospital room?: A Little Help needed climbing 3-5 steps with a railing? : A Little 6 Click Score: 18    End of Session Equipment Utilized During Treatment: Gait belt;Cervical collar Activity Tolerance: Patient tolerated treatment well Patient left: in chair;with call bell/phone within reach;with family/visitor present Nurse Communication: Mobility status (equip needs) PT Visit Diagnosis: Unsteadiness on feet (R26.81);Repeated falls (R29.6);Difficulty in walking, not elsewhere classified (R26.2);Muscle weakness (generalized) (M62.81)    Time: 1751-0258 PT Time Calculation (min) (ACUTE ONLY): 27 min   Charges:   PT Evaluation $PT Eval Moderate Complexity: 1 Mod PT Treatments $Gait Training: 8-22 mins        Kittie Plater, PT, DPT Acute Rehabilitation Services Pager #: [phone] Office #: [phone]   Berline Lopes 10/29/2020, 10:17 AM

## 2020-10-29 NOTE — Discharge Summary (Signed)
Physician Discharge Summary  Patient ID: Glenda [name] MRN: [id number] DOB/AGE: [birth year] 70 y.o. Estimated body mass index is 33.55 kg/m as calculated from the following:   Height as of this encounter: 5\' 7"  (1.702 m).   Weight as of this encounter: 97.2 kg.   Admit date: 10/28/2020 Discharge date: 10/29/2020  Admission Diagnoses: Cervical spondylitic myelopathy  Discharge Diagnoses: Same Active Problems:   HNP (herniated nucleus pulposus) with myelopathy, cervical   Discharged Condition: good  Hospital Course: Patient was admitted to the hospital underwent anterior cervical discectomies and fusion at C4-5 C5-6 and C6-7.  During induction of anesthesia she went atrial fibrillation but was rate controlled and blood pressure stable anesthesia felt comfortable proceeding forward surgery so we performed the surgery she did very well postoperatively went to recovery in the floor on the floor was ambulating and voiding spontaneously tolerating regular diet and stable for discharge home on postop day 1 patient was seen by cardiology was changed over to a different beta-blocker felt that this would be adequate and patient should be discharged today once cleared by cardiology.  Consults: Significant Diagnostic Studies: Treatments: ACDF C4-5 C5-6 C6-7 Discharge Exam: Blood pressure 98/71, pulse 79, temperature 97.6 F (36.4 C), temperature source Oral, resp. rate 15, height 5\' 7"  (1.702 m), weight 97.2 kg, SpO2 95 %. Strength 5 out of 5 wound clean dry and intact  Disposition: Home   Allergies as of 10/29/2020       Reactions   Amoxicillin Rash, Other (See Comments)   Has patient had a PCN reaction causing immediate rash, facial/tongue/throat swelling, SOB or lightheadedness with hypotension: No HAS PT DEVELOPED SEVERE RASH INVOLVING MUCUS MEMBRANES or SKIN NECROSIS: #  #  YES  #  #  Has patient had a PCN reaction that required hospitalization: No Has patient had a PCN reaction occurring  within the last 10 years: No   Erythromycin Other (See Comments)   Upset stomach        Medication List     STOP taking these medications    ibuprofen 200 MG tablet Commonly known as: ADVIL   metoprolol succinate 25 MG 24 hr tablet Commonly known as: TOPROL-XL       TAKE these medications    ALPRAZolam 0.25 MG tablet Commonly known as: XANAX Take 0.5-1 tablets (0.125-0.25 mg total) by mouth daily as needed for anxiety.   aspirin EC 81 MG tablet Take 81 mg by mouth daily.   diphenhydrAMINE 25 MG tablet Commonly known as: BENADRYL Take 25 mg by mouth every 6 (six) hours as needed for allergies.   metoprolol tartrate 25 MG tablet Commonly known as: LOPRESSOR Take 1 tablet (25 mg total) by mouth 3 (three) times daily.   Oxycodone HCl 10 MG Tabs Take 1 tablet (10 mg total) by mouth every 3 (three) hours as needed for severe pain ((score 7 to 10)).   PARoxetine 40 MG tablet Commonly known as: PAXIL Take 1 tablet (40 mg total) by mouth daily. What changed: See the new instructions.         Signed: Elaina Hoops 10/29/2020, 7:36 AM

## 2020-10-29 NOTE — Progress Notes (Signed)
IV removed, D/C instructions reviewed with patient.  Pt D/C'd home with husband, transported via wheelchair.

## 2020-10-29 NOTE — TOC Transition Note (Signed)
11Transition of Care (TOC) - CM/SW Discharge Note Marvetta Gibbons RN,BSN Transitions of Care Unit 4NP (non trauma) - RN Case Manager See Treatment Team for direct Phone #   Patient Details  Name: Glenda [name] MRN: [id number] Date of Birth: 03/14/1951  Transition of Care [facility] CM/SW Contact:  Dawayne Patricia, RN Phone Number: 10/29/2020, 12:54 PM   Clinical Narrative:    Pt stable for transition home today, per PT recs for HHPT and DME-RW and 3n1, bedside RN has sent msg for [facility] orders.  Cm spoke with pt and spouse at the bedside. List provided for [facility] choice Per CMS guidelines from medicare.gov website with star ratings (copy placed in shadow chart), pt has selected [facility] as first choice, with [facility] as backup choice.  Pt confirmed DME needs and agreeable to using in house provider to supply DME and deliver to room prior to discharge.   Address, phone #s and PCP all confirmed with pt in epic.  Spouse to transport home once DME has been delivered.   Call made to [employer] for DME needs- RW and 3n1 to be delivered to room once processed.   Call made to [facility] with [facility] for [place] referral- referral has been accepted- orders pending.     Final next level of care: [place] Barriers to Discharge: No Barriers Identified   Patient Goals and CMS Choice Patient states their goals for this hospitalization and ongoing recovery are:: return home CMS Medicare.gov Compare Post Acute Care list provided to:: Patient Choice offered to / list presented to : Patient, Spouse  Discharge Placement               Home with [facility]        Discharge Plan and Services   Discharge Planning Services: CM Consult Post Acute Care Choice: Durable Medical Equipment, Home Health          DME Arranged: 3-N-1, Walker rolling DME Agency: [employer] Date DME Agency Contacted: 10/29/20 Time DME Agency Contacted: 50 Representative spoke with at DME Agency: Freda Munro HH Arranged: PT [place]: [place] Date [place]: 10/29/20 Time [place]: 1115 Representative spoke with at [place]: [place] ([place]) Interventions     Readmission Risk Interventions Readmission Risk Prevention Plan 10/29/2020  Post Dischage Appt Complete  Medication Screening Complete  Transportation Screening Complete  Some recent data might be hidden

## 2020-10-29 NOTE — Progress Notes (Addendum)
Progress Note  Patient Name: Glenda [name] Date of Encounter: 10/29/2020  [facility] Cardiologist: Buford Dresser, MD   Subjective   Pt seen sitting up in chair. Unaware of her Afib.   Inpatient Medications    Scheduled Meds:  aspirin EC  81 mg Oral Daily   metoprolol tartrate  25 mg Oral TID   pantoprazole (PROTONIX) IV  40 mg Intravenous QHS   PARoxetine  40 mg Oral Daily   sodium chloride flush  3 mL Intravenous Q12H   Continuous Infusions:  sodium chloride     PRN Meds: acetaminophen **OR** acetaminophen, ALPRAZolam, alum & mag hydroxide-simeth, cyclobenzaprine, diphenhydrAMINE, HYDROmorphone (DILAUDID) injection, menthol-cetylpyridinium **OR** phenol, ondansetron **OR** ondansetron (ZOFRAN) IV, oxyCODONE, sodium chloride flush   Vital Signs    Vitals:   10/28/20 2327 10/29/20 0324 10/29/20 0343 10/29/20 0734  BP: 93/64 127/80  98/71  Pulse: 79 73  79  Resp: 17 16  15   Temp: 98.4 F (36.9 C) 97.9 F (36.6 C)  97.6 F (36.4 C)  TempSrc: Oral Oral  Oral  SpO2: 90% 92% 96% 95%  Weight:      Height:        Intake/Output Summary (Last 24 hours) at 10/29/2020 0956 Last data filed at 10/29/2020 0345 Gross per 24 hour  Intake 1790 ml  Output 960 ml  Net 830 ml   Last 3 Weights 10/28/2020 10/24/2020 09/26/2020  Weight (lbs) 214 lb 3.2 oz 214 lb 3.2 oz 216 lb  Weight (kg) 97.16 kg 97.16 kg 97.977 kg      Telemetry    Atrial fibrillation with rates now in the 70-80s - Personally Reviewed  ECG    No new tracings - Personally Reviewed  Physical Exam   GEN: No acute distress, in C collar Cardiac: irregular rhythm, regular rate, no murmur Respiratory: Clear to auscultation bilaterally. GI: Soft, nontender, non-distended  MS: No edema; No deformity. Neuro:  Nonfocal  Psych: Normal affect   Labs    High Sensitivity Troponin:  No results for input(s): TROPONINIHS in the last 720 hours.    Chemistry Recent Labs  Lab 10/24/20 1000 10/28/20 1550   NA 141 139  K 3.9 5.1  CL 106 102  CO2 28 32  GLUCOSE 123* 151*  BUN 14 14  CREATININE 0.79 0.80  CALCIUM 9.2 8.9  GFRNONAA >60 >60  ANIONGAP 7 5     Hematology Recent Labs  Lab 10/24/20 1000  WBC 6.6  RBC 4.68  HGB 14.7  HCT 45.6  MCV 97.4  MCH 31.4  MCHC 32.2  RDW 12.9  PLT 293    BNPNo results for input(s): BNP, PROBNP in the last 168 hours.   DDimer No results for input(s): DDIMER in the last 168 hours.   Radiology    DG Cervical Spine 1 View  Result Date: 10/28/2020 CLINICAL DATA:  C4-C7 ACDF. EXAM: DG C-ARM 1-60 MIN; DG CERVICAL SPINE - 1 VIEW FLUOROSCOPY TIME:  Fluoroscopy Time:  10.9 seconds. Radiation Exposure Index (if provided by the fluoroscopic device): 3.58 mGy. Number of Acquired Spot Images: 2 COMPARISON:  MRI September 01, 2020. FINDINGS: Two C-arm fluoroscopic images were obtained intraoperatively and submitted for post operative interpretation. These images demonstrate anterior plate and screws spanning C4 through C7. Intervening spacers at C4-C5, C5-C6, and C6-C7. Gauze projects anteriorly. Please see the performing provider's procedural report for further detail. IMPRESSION: C4-C7 ACDF. Electronically Signed   By: Margaretha Sheffield MD   On: 10/28/2020 10:49  DG C-Arm 1-60 Min  Result Date: 10/28/2020 CLINICAL DATA:  C4-C7 ACDF. EXAM: DG C-ARM 1-60 MIN; DG CERVICAL SPINE - 1 VIEW FLUOROSCOPY TIME:  Fluoroscopy Time:  10.9 seconds. Radiation Exposure Index (if provided by the fluoroscopic device): 3.58 mGy. Number of Acquired Spot Images: 2 COMPARISON:  MRI September 01, 2020. FINDINGS: Two C-arm fluoroscopic images were obtained intraoperatively and submitted for post operative interpretation. These images demonstrate anterior plate and screws spanning C4 through C7. Intervening spacers at C4-C5, C5-C6, and C6-C7. Gauze projects anteriorly. Please see the performing provider's procedural report for further detail. IMPRESSION: C4-C7 ACDF. Electronically Signed    By: Margaretha Sheffield MD   On: 10/28/2020 10:49    Cardiac Studies   Echo 09/13/20: 1. Left ventricular ejection fraction, by estimation, is 60 to 65%. The  left ventricle has normal function. The left ventricle has no regional  wall motion abnormalities. There is mild left ventricular hypertrophy.  Left ventricular diastolic parameters  are indeterminate.   2. Right ventricular systolic function is normal. The right ventricular  size is normal. Tricuspid regurgitation signal is inadequate for assessing  PA pressure.   3. The mitral valve is normal in structure. No evidence of mitral valve  regurgitation.   4. The aortic valve was not well visualized. Aortic valve regurgitation  is not visualized. No aortic stenosis is present.   5. The inferior vena cava is normal in size with greater than 50%  respiratory variability, suggesting right atrial pressure of 3 mmHg.   6. Rhythm is irregular and no clear A-wave seen on mitral inflow Doppler,  concerning for atrial fibrillation. Recommend evaluating for Afib with  cardiac monitor   Patient Profile     70 y.o. adult with a hx of HTN, PAT, anxiety, obesity, pre-diabetes, and tobacco use who is being seen 10/28/2020 for the evaluation of Afib.   Assessment & Plan    Paroxysmal atrial fibrillation with RVR - titrated lopressor to 25 mg q8 hr - rates now in the 70-80s - discussed toprol dosing  - she will increase to 25 mg toprol BID given hx of hypotension - she will monitor BP and symptoms - TSH, Mg WNL - consider 3 weeks of OAC then DCCV   Chronic anticoagulation This patients CHA2DS2-VASc Score and unadjusted Ischemic Stroke Rate (% per year) is equal to 9.7 % stroke rate/year from a score of 6 (female, age, HTN, CVA2, carotid artery disease) - per  neurosurgery - wait a minimum of 72 hr, prefer 1 week before starting anticoagulation - given her high stroke risk (hx of CVA), will start eliquis on Friday morning.  - stop  ASA   Disordered sleeping Snoring - needs an OP sleep study   HTN - BB as above -  pressure marginal after surgery   I have arranged follow up in our Afib clinic.      For questions or updates, please contact [place] Please consult [email or web address] for contact info under        Signed, Ledora Bottcher, PA  10/29/2020, 9:56 AM

## 2020-10-31 DIAGNOSIS — Z79891 Long term (current) use of opiate analgesic: Secondary | ICD-10-CM | POA: Diagnosis not present

## 2020-10-31 DIAGNOSIS — Z7901 Long term (current) use of anticoagulants: Secondary | ICD-10-CM | POA: Diagnosis not present

## 2020-10-31 DIAGNOSIS — M5000 Cervical disc disorder with myelopathy, unspecified cervical region: Secondary | ICD-10-CM | POA: Diagnosis not present

## 2020-10-31 DIAGNOSIS — M4712 Other spondylosis with myelopathy, cervical region: Secondary | ICD-10-CM | POA: Diagnosis not present

## 2020-10-31 DIAGNOSIS — Z4789 Encounter for other orthopedic aftercare: Secondary | ICD-10-CM | POA: Diagnosis not present

## 2020-10-31 DIAGNOSIS — M170 Bilateral primary osteoarthritis of knee: Secondary | ICD-10-CM | POA: Diagnosis not present

## 2020-10-31 DIAGNOSIS — I4891 Unspecified atrial fibrillation: Secondary | ICD-10-CM | POA: Diagnosis not present

## 2020-10-31 DIAGNOSIS — Z981 Arthrodesis status: Secondary | ICD-10-CM | POA: Diagnosis not present

## 2020-10-31 DIAGNOSIS — I10 Essential (primary) hypertension: Secondary | ICD-10-CM | POA: Diagnosis not present

## 2020-11-05 ENCOUNTER — Other Ambulatory Visit: Payer: Self-pay

## 2020-11-05 ENCOUNTER — Encounter (HOSPITAL_COMMUNITY): Payer: Self-pay | Admitting: Nurse Practitioner

## 2020-11-05 ENCOUNTER — Ambulatory Visit (HOSPITAL_COMMUNITY)
Admit: 2020-11-05 | Discharge: 2020-11-05 | Disposition: A | Discharge status: Home / Self Care | Payer: Medicare PPO | Source: Ambulatory Visit | Attending: Nurse Practitioner | Admitting: Nurse Practitioner

## 2020-11-05 VITALS — BP 150/88 | HR 87 | Ht 67.0 in | Wt 210.4 lb

## 2020-11-05 DIAGNOSIS — R0683 Snoring: Secondary | ICD-10-CM | POA: Insufficient documentation

## 2020-11-05 DIAGNOSIS — I4891 Unspecified atrial fibrillation: Secondary | ICD-10-CM | POA: Insufficient documentation

## 2020-11-05 DIAGNOSIS — Z09 Encounter for follow-up examination after completed treatment for conditions other than malignant neoplasm: Secondary | ICD-10-CM | POA: Diagnosis not present

## 2020-11-05 DIAGNOSIS — Z7901 Long term (current) use of anticoagulants: Secondary | ICD-10-CM | POA: Insufficient documentation

## 2020-11-05 DIAGNOSIS — Z87891 Personal history of nicotine dependence: Secondary | ICD-10-CM | POA: Insufficient documentation

## 2020-11-05 DIAGNOSIS — Z79899 Other long term (current) drug therapy: Secondary | ICD-10-CM | POA: Diagnosis not present

## 2020-11-05 DIAGNOSIS — D6869 Other thrombophilia: Secondary | ICD-10-CM | POA: Diagnosis not present

## 2020-11-05 NOTE — Progress Notes (Addendum)
Primary Care Physician: Martinique, Betty G, MD Referring Physician: [facility] f/u    Glenda [name] is a 70 y.o. adult with a h/o new onset afib that is in the afib clinic for f/u. Afib was noted on pre op EKG and also at time of echo in April that was done for pre op clearance,  although pt was not notified. While  in the hospital for neck surgery, afib was present and she was consulted by cardiology. She was started on BB for rate control and with CHA2DS2VASc  score of 6, she was started on eliquis 72 hours after surgery. She was not aware of her afib.   In the office today, she remains in afib rate controlled. She reports that she has quit smoking. She drinks 2 alcoholic drinks a week. Husband reports that she has had snoring and apnea in the past but he does not note apnea now although she still has some snoring. She does not exercise.   Today, she denies symptoms of palpitations, chest pain, shortness of breath, orthopnea, PND, lower extremity edema, dizziness, presyncope, syncope, or neurologic sequela. The patient is tolerating medications without difficulties and is otherwise without complaint today.   Past Medical History:  Diagnosis Date   Allergic rhinitis    Allergy    Anxiety    Arthritis    Cataract    Heart murmur    History of colonic polyps    History of PSVT (paroxysmal supraventricular tachycardia)    Hypertension    Impaired glucose tolerance    Obesity    PAT (paroxysmal atrial tachycardia) (HCC)    Pre-diabetes    hx, not currently being monitor for diabetes, diet controlled   Stroke ([place]) 08/2020   Scan revealed stroke in history   Tobacco user    Vertigo    started 3 weeks ago- after fluid trapped in ear   Past Surgical History:  Procedure Laterality Date   ankle sx-left fx     ANTERIOR CERVICAL DECOMP/DISCECTOMY FUSION N/A 10/28/2020   Procedure: Anterior Cervical Decompression Fusion - Cervical Four-Cervical Five - Cervical Five-Cervical Six - Cervical Six-  Cervical Seven;  Surgeon: Kary Kos, MD;  Location: [place];  Service: Neurosurgery;  Laterality: N/A;  Anterior Cervical Decompression Fusion - Cervical Four-Cervical Five - Cervical Five-Cervical Six - Cervical Six- Cervical Seven   CATARACT EXTRACTION     both eyes   CHOLECYSTECTOMY N/A 01/11/2018   Procedure: LAPAROSCOPIC CHOLECYSTECTOMY WITH INTRAOPERATIVE CHOLANGIOGRAM ERAS PATHWAY;  Surgeon: Alphonsa Overall, MD;  Location: [place];  Service: General;  Laterality: N/A;   COLONOSCOPY  04/23/2005   DIAGNOSTIC LAPAROSCOPY     EYE SURGERY     foot sx     with the ankle surgery   POLYPECTOMY      Current Outpatient Medications  Medication Sig Dispense Refill   acetaminophen (TYLENOL) 325 MG tablet Take 650 mg by mouth every 4 (four) hours as needed.     ALPRAZolam (XANAX) 0.25 MG tablet Take 0.5-1 tablets (0.125-0.25 mg total) by mouth daily as needed for anxiety. 30 tablet 0   apixaban (ELIQUIS) 5 MG TABS tablet Take 1 tablet (5 mg total) by mouth 2 (two) times daily. First dose 11/01/20. 180 tablet 3   diphenhydrAMINE (BENADRYL) 25 MG tablet Take 25 mg by mouth every 6 (six) hours as needed for allergies.     metoprolol succinate (TOPROL-XL) 25 MG 24 hr tablet Take 1 tablet (25 mg total) by mouth 2 (two) times daily.  180 tablet 3   PARoxetine (PAXIL) 40 MG tablet Take 1 tablet (40 mg total) by mouth daily. 30 tablet 0   No current facility-administered medications for this encounter.    Allergies  Allergen Reactions   Amoxicillin Rash and Other (See Comments)    Has patient had a PCN reaction causing immediate rash, facial/tongue/throat swelling, SOB or lightheadedness with hypotension: No HAS PT DEVELOPED SEVERE RASH INVOLVING MUCUS MEMBRANES or SKIN NECROSIS: #  #  YES  #  #  Has patient had a PCN reaction that required hospitalization: No Has patient had a PCN reaction occurring within the last 10 years: No    Codeine Other (See Comments)    Hallucinations   Erythromycin Other (See  Comments)    Upset stomach    Social History   Socioeconomic History   Marital status: Married    Spouse name: Not on file   Number of children: 2   Years of education: college   Highest education level: Not on file  Occupational History   Occupation: Retired  Tobacco Use   Smoking status: Former    Packs/day: 0.50    Pack years: 0.00    Types: Cigarettes    Quit date: 10/17/2020    Years since quitting: 0.0   Smokeless tobacco: Never  Vaping Use   Vaping Use: Never used  Substance and Sexual Activity   Alcohol use: Yes    Comment: weekends   Drug use: No   Sexual activity: Yes  Other Topics Concern   Not on file  Social History Narrative   Lives at home with her husband.   Right-handed.   One cup caffeine per day.   Social Determinants of Health   Financial Resource Strain: Not on file  Food Insecurity: Not on file  Transportation Needs: Not on file  Physical Activity: Not on file  Stress: Not on file  Social Connections: Not on file  Intimate Partner Violence: Not on file    Family History  Problem Relation Age of Onset   Healthy Mother    Dementia Father    Healthy Sister    Healthy Sister    Alzheimer's disease Other    Colon cancer Neg Hx    Esophageal cancer Neg Hx    Rectal cancer Neg Hx    Stomach cancer Neg Hx    Colon polyps Neg Hx     ROS- All systems are reviewed and negative except as per the HPI above  Physical Exam: Vitals:   11/05/20 1431  BP: (!) 150/88  Pulse: 87  Weight: 95.4 kg  Height: 5\' 7"  (1.702 m)   Wt Readings from Last 3 Encounters:  11/05/20 95.4 kg  10/28/20 97.2 kg  10/24/20 97.2 kg    Labs: Lab Results  Component Value Date   NA 139 10/28/2020   K 5.1 10/28/2020   CL 102 10/28/2020   CO2 32 10/28/2020   GLUCOSE 151 (H) 10/28/2020   BUN 14 10/28/2020   CREATININE 0.80 10/28/2020   CALCIUM 8.9 10/28/2020   MG 1.9 10/28/2020   No results found for: INR Lab Results  Component Value Date   CHOL 204  (H) 11/24/2019   HDL 37 (L) 11/24/2019   LDLCALC 142 (H) 11/24/2019   TRIG 123 11/24/2019     GEN- The patient is well appearing, alert and oriented x 3 today.   Head- normocephalic, atraumatic Eyes-  Sclera clear, conjunctiva pink Ears- hearing intact Oropharynx- clear Neck- supple, no  JVP Lymph- no cervical lymphadenopathy Lungs- Clear to ausculation bilaterally, normal work of breathing Heart- irregular rate and rhythm, no murmurs, rubs or gallops, PMI not laterally displaced GI- soft, NT, ND, + BS Extremities- no clubbing, cyanosis, or edema MS- no significant deformity or atrophy Skin- no rash or lesion Psych- euthymic mood, full affect Neuro- strength and sensation are intact  EKG-afib at 67 bpm, qrs int 76 ms,  qtc 411 ms     Assessment and Plan:  1. New onset afib Rate controlled and asymptomatic Continue metoprolol 25 mg bid  General education and triggers discussed    2. CHA2DS2VASc  score of 6 ( female, age,  CVA(2), carotid disease)  Continue eliquis 5 mg bid  Bleeding precautions reviewed First full day of anticoagulation was 6/17 Will be eligible for CV after 7/8  Reminded not to miss any doses for the 21 days prior to cardioversion   3. BMI of 32.95  Weight loss and regular exercise encouraged Has stopped smoking  Avoid alcohol  Snoring but no apnea per husband   Will see back in 10 days to schedule for CV   Butch Penny C. Verl Whitmore, [facility] [address] [place], [place] [address] [phone]

## 2020-11-07 DIAGNOSIS — I4891 Unspecified atrial fibrillation: Secondary | ICD-10-CM | POA: Diagnosis not present

## 2020-11-07 DIAGNOSIS — Z981 Arthrodesis status: Secondary | ICD-10-CM | POA: Diagnosis not present

## 2020-11-07 DIAGNOSIS — Z4789 Encounter for other orthopedic aftercare: Secondary | ICD-10-CM | POA: Diagnosis not present

## 2020-11-07 DIAGNOSIS — M170 Bilateral primary osteoarthritis of knee: Secondary | ICD-10-CM | POA: Diagnosis not present

## 2020-11-07 DIAGNOSIS — I10 Essential (primary) hypertension: Secondary | ICD-10-CM | POA: Diagnosis not present

## 2020-11-07 DIAGNOSIS — Z7901 Long term (current) use of anticoagulants: Secondary | ICD-10-CM | POA: Diagnosis not present

## 2020-11-07 DIAGNOSIS — M5000 Cervical disc disorder with myelopathy, unspecified cervical region: Secondary | ICD-10-CM | POA: Diagnosis not present

## 2020-11-07 DIAGNOSIS — M4712 Other spondylosis with myelopathy, cervical region: Secondary | ICD-10-CM | POA: Diagnosis not present

## 2020-11-07 DIAGNOSIS — Z79891 Long term (current) use of opiate analgesic: Secondary | ICD-10-CM | POA: Diagnosis not present

## 2020-11-14 ENCOUNTER — Ambulatory Visit (HOSPITAL_COMMUNITY)
Admission: RE | Admit: 2020-11-14 | Discharge: 2020-11-14 | Disposition: A | Discharge status: Home / Self Care | Payer: Medicare PPO | Source: Ambulatory Visit | Attending: Nurse Practitioner | Admitting: Nurse Practitioner

## 2020-11-14 ENCOUNTER — Other Ambulatory Visit: Payer: Self-pay

## 2020-11-14 ENCOUNTER — Encounter (HOSPITAL_COMMUNITY): Payer: Self-pay | Admitting: Nurse Practitioner

## 2020-11-14 VITALS — BP 150/76 | HR 86 | Ht 67.0 in | Wt 210.2 lb

## 2020-11-14 DIAGNOSIS — Z87891 Personal history of nicotine dependence: Secondary | ICD-10-CM | POA: Insufficient documentation

## 2020-11-14 DIAGNOSIS — Z885 Allergy status to narcotic agent status: Secondary | ICD-10-CM | POA: Insufficient documentation

## 2020-11-14 DIAGNOSIS — Z981 Arthrodesis status: Secondary | ICD-10-CM | POA: Diagnosis not present

## 2020-11-14 DIAGNOSIS — Z4789 Encounter for other orthopedic aftercare: Secondary | ICD-10-CM | POA: Diagnosis not present

## 2020-11-14 DIAGNOSIS — M4712 Other spondylosis with myelopathy, cervical region: Secondary | ICD-10-CM | POA: Diagnosis not present

## 2020-11-14 DIAGNOSIS — Z881 Allergy status to other antibiotic agents status: Secondary | ICD-10-CM | POA: Insufficient documentation

## 2020-11-14 DIAGNOSIS — Z79899 Other long term (current) drug therapy: Secondary | ICD-10-CM | POA: Insufficient documentation

## 2020-11-14 DIAGNOSIS — M5000 Cervical disc disorder with myelopathy, unspecified cervical region: Secondary | ICD-10-CM | POA: Diagnosis not present

## 2020-11-14 DIAGNOSIS — Z6832 Body mass index (BMI) 32.0-32.9, adult: Secondary | ICD-10-CM | POA: Diagnosis not present

## 2020-11-14 DIAGNOSIS — I779 Disorder of arteries and arterioles, unspecified: Secondary | ICD-10-CM | POA: Diagnosis not present

## 2020-11-14 DIAGNOSIS — Z713 Dietary counseling and surveillance: Secondary | ICD-10-CM | POA: Insufficient documentation

## 2020-11-14 DIAGNOSIS — D6869 Other thrombophilia: Secondary | ICD-10-CM | POA: Diagnosis not present

## 2020-11-14 DIAGNOSIS — Z8673 Personal history of transient ischemic attack (TIA), and cerebral infarction without residual deficits: Secondary | ICD-10-CM | POA: Diagnosis not present

## 2020-11-14 DIAGNOSIS — E669 Obesity, unspecified: Secondary | ICD-10-CM | POA: Insufficient documentation

## 2020-11-14 DIAGNOSIS — Z79891 Long term (current) use of opiate analgesic: Secondary | ICD-10-CM | POA: Diagnosis not present

## 2020-11-14 DIAGNOSIS — I4891 Unspecified atrial fibrillation: Secondary | ICD-10-CM | POA: Diagnosis not present

## 2020-11-14 DIAGNOSIS — M170 Bilateral primary osteoarthritis of knee: Secondary | ICD-10-CM | POA: Diagnosis not present

## 2020-11-14 DIAGNOSIS — R0683 Snoring: Secondary | ICD-10-CM | POA: Insufficient documentation

## 2020-11-14 DIAGNOSIS — Z7901 Long term (current) use of anticoagulants: Secondary | ICD-10-CM | POA: Diagnosis not present

## 2020-11-14 DIAGNOSIS — Z7289 Other problems related to lifestyle: Secondary | ICD-10-CM | POA: Diagnosis not present

## 2020-11-14 DIAGNOSIS — I10 Essential (primary) hypertension: Secondary | ICD-10-CM | POA: Diagnosis not present

## 2020-11-14 LAB — CBC
HCT: 45.6 % (ref 36.0–46.0)
Hemoglobin: 14.7 g/dL (ref 12.0–15.0)
MCH: 31.1 pg (ref 26.0–34.0)
MCHC: 32.2 g/dL (ref 30.0–36.0)
MCV: 96.6 fL (ref 80.0–100.0)
Platelets: 373 10*3/uL (ref 150–400)
RBC: 4.72 MIL/uL (ref 3.87–5.11)
RDW: 12.4 % (ref 11.5–15.5)
WBC: 8.6 10*3/uL (ref 4.0–10.5)
nRBC: 0 % (ref 0.0–0.2)

## 2020-11-14 LAB — BASIC METABOLIC PANEL
Anion gap: 8 (ref 5–15)
BUN: 13 mg/dL (ref 8–23)
CO2: 28 mmol/L (ref 22–32)
Calcium: 9.3 mg/dL (ref 8.9–10.3)
Chloride: 103 mmol/L (ref 98–111)
Creatinine, Ser: 0.73 mg/dL (ref 0.44–1.00)
GFR, Estimated: >60 mL/min (ref >60)
Glucose, Bld: 111 mg/dL — ABNORMAL HIGH (ref 70–99)
Potassium: 5.2 mmol/L — ABNORMAL HIGH (ref 3.5–5.1)
Sodium: 139 mmol/L (ref 135–145)

## 2020-11-14 NOTE — Progress Notes (Addendum)
Primary Care Physician: Martinique, Betty G, MD Referring Physician: [facility] f/u    Glenda [name] is a 70 y.o. adult with a h/o new onset afib that is in the afib clinic for f/u. Afib was noted on pre op EKG and also at time of echo in April that was done for pre op clearance, although pt was not notified. While  in the hospital for neck surgery, afib was present and she was consulted by cardiology. She was started on BB for rate control and with CHA2DS2VASc score of 6, she was started on eliquis 72 hours after surgery. She was not aware of her afib.   In the office today, she remains in afib rate controlled. She reports that she has quit smoking. She drinks 2 alcoholic drinks a week. Husband reports that she has had snoring and apnea in the past but he does not note apnea now although she still has some snoring. She does not exercise.   F/u afib clinic, 6/30, remains in a fib at 86 bpm. She will be able to have cardioversion after 7/8 to allow for adequate anticoagulation. She has not missed any anticoagulation since start of drug, 6/17. She si tolerating well.   Today, she denies symptoms of palpitations, chest pain, shortness of breath, orthopnea, PND, lower extremity edema, dizziness, presyncope, syncope, or neurologic sequela. The patient is tolerating medications without difficulties and is otherwise without complaint today.   Past Medical History:  Diagnosis Date   Allergic rhinitis    Allergy    Anxiety    Arthritis    Cataract    Heart murmur    History of colonic polyps    History of PSVT (paroxysmal supraventricular tachycardia)    Hypertension    Impaired glucose tolerance    Obesity    PAT (paroxysmal atrial tachycardia) (HCC)    Pre-diabetes    hx, not currently being monitor for diabetes, diet controlled   Stroke ([place]) 08/2020   Scan revealed stroke in history   Tobacco user    Vertigo    started 3 weeks ago- after fluid trapped in ear   Past Surgical History:  Procedure  Laterality Date   ankle sx-left fx     ANTERIOR CERVICAL DECOMP/DISCECTOMY FUSION N/A 10/28/2020   Procedure: Anterior Cervical Decompression Fusion - Cervical Four-Cervical Five - Cervical Five-Cervical Six - Cervical Six- Cervical Seven;  Surgeon: Kary Kos, MD;  Location: [place];  Service: Neurosurgery;  Laterality: N/A;  Anterior Cervical Decompression Fusion - Cervical Four-Cervical Five - Cervical Five-Cervical Six - Cervical Six- Cervical Seven   CATARACT EXTRACTION     both eyes   CHOLECYSTECTOMY N/A 01/11/2018   Procedure: LAPAROSCOPIC CHOLECYSTECTOMY WITH INTRAOPERATIVE CHOLANGIOGRAM ERAS PATHWAY;  Surgeon: Alphonsa Overall, MD;  Location: [place];  Service: General;  Laterality: N/A;   COLONOSCOPY  04/23/2005   DIAGNOSTIC LAPAROSCOPY     EYE SURGERY     foot sx     with the ankle surgery   POLYPECTOMY      Current Outpatient Medications  Medication Sig Dispense Refill   acetaminophen (TYLENOL) 325 MG tablet Take 650 mg by mouth every 4 (four) hours as needed.     ALPRAZolam (XANAX) 0.25 MG tablet Take 0.5-1 tablets (0.125-0.25 mg total) by mouth daily as needed for anxiety. 30 tablet 0   apixaban (ELIQUIS) 5 MG TABS tablet Take 1 tablet (5 mg total) by mouth 2 (two) times daily. First dose 11/01/20. 180 tablet 3   diphenhydrAMINE (BENADRYL)  25 MG tablet Take 25 mg by mouth every 6 (six) hours as needed for allergies.     metoprolol succinate (TOPROL-XL) 25 MG 24 hr tablet Take 1 tablet (25 mg total) by mouth 2 (two) times daily. 180 tablet 3   PARoxetine (PAXIL) 40 MG tablet Take 1 tablet (40 mg total) by mouth daily. 30 tablet 0   No current facility-administered medications for this encounter.    Allergies  Allergen Reactions   Amoxicillin Rash and Other (See Comments)    Has patient had a PCN reaction causing immediate rash, facial/tongue/throat swelling, SOB or lightheadedness with hypotension: No HAS PT DEVELOPED SEVERE RASH INVOLVING MUCUS MEMBRANES or SKIN NECROSIS: #  #   YES  #  #  Has patient had a PCN reaction that required hospitalization: No Has patient had a PCN reaction occurring within the last 10 years: No    Codeine Other (See Comments)    Hallucinations   Erythromycin Other (See Comments)    Upset stomach    Social History   Socioeconomic History   Marital status: Married    Spouse name: Not on file   Number of children: 2   Years of education: college   Highest education level: Not on file  Occupational History   Occupation: Retired  Tobacco Use   Smoking status: Former    Packs/day: 0.50    Pack years: 0.00    Types: Cigarettes    Quit date: 10/17/2020    Years since quitting: 0.0   Smokeless tobacco: Never  Vaping Use   Vaping Use: Never used  Substance and Sexual Activity   Alcohol use: Yes    Comment: weekends   Drug use: No   Sexual activity: Yes  Other Topics Concern   Not on file  Social History Narrative   Lives at home with her husband.   Right-handed.   One cup caffeine per day.   Social Determinants of Health   Financial Resource Strain: Not on file  Food Insecurity: Not on file  Transportation Needs: Not on file  Physical Activity: Not on file  Stress: Not on file  Social Connections: Not on file  Intimate Partner Violence: Not on file    Family History  Problem Relation Age of Onset   Healthy Mother    Dementia Father    Healthy Sister    Healthy Sister    Alzheimer's disease Other    Colon cancer Neg Hx    Esophageal cancer Neg Hx    Rectal cancer Neg Hx    Stomach cancer Neg Hx    Colon polyps Neg Hx     ROS- All systems are reviewed and negative except as per the HPI above  Physical Exam: Vitals:   11/14/20 1053  BP: (!) 150/76  Pulse: 86  Weight: 95.3 kg  Height: 5\' 7"  (1.702 m)   Wt Readings from Last 3 Encounters:  11/14/20 95.3 kg  11/05/20 95.4 kg  10/24/20 97.2 kg    Labs: Lab Results  Component Value Date   NA 139 10/28/2020   K 5.1 10/28/2020   CL 102 10/28/2020    CO2 32 10/28/2020   GLUCOSE 151 (H) 10/28/2020   BUN 14 10/28/2020   CREATININE 0.80 10/28/2020   CALCIUM 8.9 10/28/2020   MG 1.9 10/28/2020   No results found for: INR Lab Results  Component Value Date   CHOL 204 (H) 11/24/2019   HDL 37 (L) 11/24/2019   LDLCALC 142 (H) 11/24/2019  TRIG 123 11/24/2019     GEN- The patient is well appearing, alert and oriented x 3 today.   Head- normocephalic, atraumatic Eyes-  Sclera clear, conjunctiva pink Ears- hearing intact Oropharynx- clear Neck- supple, no JVP Lymph- no cervical lymphadenopathy Lungs- Clear to ausculation bilaterally, normal work of breathing Heart- irregular rate and rhythm, no murmurs, rubs or gallops, PMI not laterally displaced GI- soft, NT, ND, + BS Extremities- no clubbing, cyanosis, or edema MS- no significant deformity or atrophy Skin- no rash or lesion Psych- euthymic mood, full affect Neuro- strength and sensation are intact  EKG-afib at 86 bpm, qrs int 82 ms,  qtc 406 ms     Assessment and Plan:  1. New onset afib Rate controlled and asymptomatic Continue metoprolol 25 mg bid  General education and triggers discussed    2. CHA2DS2VASc  score of 6 ( female, age,  CVA(2), carotid disease)  Continue eliquis 5 mg bid  Bleeding precautions reviewed First full day of anticoagulation was 6/17 Will be eligible for CV after 7/8  Reminded not to miss any doses for the 21 days prior to cardioversion  CV scheduled for 7/14, risk vrs benefit discussed  Cbc/bmet  3. BMI of 32.95  Weight loss and regular exercise encouraged Has stopped smoking  Avoid alcohol  Snoring but no apnea per husband   Will see back 1 week after cardioversion  Butch Penny C. Amias Hutchinson, [facility] [address] [place], [place] [address] [phone]

## 2020-11-14 NOTE — Patient Instructions (Signed)
Cardioversion scheduled for Thursday, July 14th  - Arrive at the [facility] and go to admitting at 730AM  - Do not eat or drink anything after midnight the night prior to your procedure.  - Take all your morning medication (except diabetic medications) with a sip of water prior to arrival.  - You will not be able to drive home after your procedure.  - Do NOT miss any doses of your blood thinner - if you should miss a dose please notify our office immediately.  - If you feel as if you go back into normal rhythm prior to scheduled cardioversion, please notify our office immediately. If your procedure is canceled in the cardioversion suite you will be charged a cancellation fee. Patients will be asked to: to mask in public and hand hygiene (no longer quarantine) in the 3 days prior to surgery, to report if any COVID-19-like illness or household contacts to COVID-19 to determine need for testing

## 2020-11-14 NOTE — H&P (View-Only) (Signed)
Primary Care Physician: Martinique, Betty G, MD Referring Physician: [facility] f/u    Glenda [name] is a 70 y.o. adult with a h/o new onset afib that is in the hospital for f/u. Afib was noted on pre op EKG and also at time of echo in April that was done for pre op clearance, although pt was not notified. While  in the hospital for neck surgery, afib was present and she was consulted by cardiology. She was started on BB for rate control and with CHA2DS2VASc score of 6, she was started on eliquis 72 hours after surgery. She was not aware of her afib.   In the office today, she remains in afib rate controlled. She reports that she has quit smoking. She drinks 2 alcoholic drinks a week. Husband reports that she has had snoring and apnea in the past but he does not note apnea now although she still has some snoring. She does not exercise.   F/u afib clinic, 6/30, remains in a fib at 86 bpm. She will be able to have cardioversion after 7/8 to allow for adequate anticoagulation. She has not missed any anticoagulation since start of drug, 6/17. She si tolerating well.   Today, she denies symptoms of palpitations, chest pain, shortness of breath, orthopnea, PND, lower extremity edema, dizziness, presyncope, syncope, or neurologic sequela. The patient is tolerating medications without difficulties and is otherwise without complaint today.   Past Medical History:  Diagnosis Date   Allergic rhinitis    Allergy    Anxiety    Arthritis    Cataract    Heart murmur    History of colonic polyps    History of PSVT (paroxysmal supraventricular tachycardia)    Hypertension    Impaired glucose tolerance    Obesity    PAT (paroxysmal atrial tachycardia) (HCC)    Pre-diabetes    hx, not currently being monitor for diabetes, diet controlled   Stroke ([place]) 08/2020   Scan revealed stroke in history   Tobacco user    Vertigo    started 3 weeks ago- after fluid trapped in ear   Past Surgical History:  Procedure  Laterality Date   ankle sx-left fx     ANTERIOR CERVICAL DECOMP/DISCECTOMY FUSION N/A 10/28/2020   Procedure: Anterior Cervical Decompression Fusion - Cervical Four-Cervical Five - Cervical Five-Cervical Six - Cervical Six- Cervical Seven;  Surgeon: Kary Kos, MD;  Location: [place];  Service: Neurosurgery;  Laterality: N/A;  Anterior Cervical Decompression Fusion - Cervical Four-Cervical Five - Cervical Five-Cervical Six - Cervical Six- Cervical Seven   CATARACT EXTRACTION     both eyes   CHOLECYSTECTOMY N/A 01/11/2018   Procedure: LAPAROSCOPIC CHOLECYSTECTOMY WITH INTRAOPERATIVE CHOLANGIOGRAM ERAS PATHWAY;  Surgeon: Alphonsa Overall, MD;  Location: [place];  Service: General;  Laterality: N/A;   COLONOSCOPY  04/23/2005   DIAGNOSTIC LAPAROSCOPY     EYE SURGERY     foot sx     with the ankle surgery   POLYPECTOMY      Current Outpatient Medications  Medication Sig Dispense Refill   acetaminophen (TYLENOL) 325 MG tablet Take 650 mg by mouth every 4 (four) hours as needed.     ALPRAZolam (XANAX) 0.25 MG tablet Take 0.5-1 tablets (0.125-0.25 mg total) by mouth daily as needed for anxiety. 30 tablet 0   apixaban (ELIQUIS) 5 MG TABS tablet Take 1 tablet (5 mg total) by mouth 2 (two) times daily. First dose 11/01/20. 180 tablet 3   diphenhydrAMINE (BENADRYL) 25  MG tablet Take 25 mg by mouth every 6 (six) hours as needed for allergies.     metoprolol succinate (TOPROL-XL) 25 MG 24 hr tablet Take 1 tablet (25 mg total) by mouth 2 (two) times daily. 180 tablet 3   PARoxetine (PAXIL) 40 MG tablet Take 1 tablet (40 mg total) by mouth daily. 30 tablet 0   No current facility-administered medications for this encounter.    Allergies  Allergen Reactions   Amoxicillin Rash and Other (See Comments)    Has patient had a PCN reaction causing immediate rash, facial/tongue/throat swelling, SOB or lightheadedness with hypotension: No HAS PT DEVELOPED SEVERE RASH INVOLVING MUCUS MEMBRANES or SKIN NECROSIS: #  #   YES  #  #  Has patient had a PCN reaction that required hospitalization: No Has patient had a PCN reaction occurring within the last 10 years: No    Codeine Other (See Comments)    Hallucinations   Erythromycin Other (See Comments)    Upset stomach    Social History   Socioeconomic History   Marital status: Married    Spouse name: Not on file   Number of children: 2   Years of education: college   Highest education level: Not on file  Occupational History   Occupation: Retired  Tobacco Use   Smoking status: Former    Packs/day: 0.50    Pack years: 0.00    Types: Cigarettes    Quit date: 10/17/2020    Years since quitting: 0.0   Smokeless tobacco: Never  Vaping Use   Vaping Use: Never used  Substance and Sexual Activity   Alcohol use: Yes    Comment: weekends   Drug use: No   Sexual activity: Yes  Other Topics Concern   Not on file  Social History Narrative   Lives at home with her husband.   Right-handed.   One cup caffeine per day.   Social Determinants of Health   Financial Resource Strain: Not on file  Food Insecurity: Not on file  Transportation Needs: Not on file  Physical Activity: Not on file  Stress: Not on file  Social Connections: Not on file  Intimate Partner Violence: Not on file    Family History  Problem Relation Age of Onset   Healthy Mother    Dementia Father    Healthy Sister    Healthy Sister    Alzheimer's disease Other    Colon cancer Neg Hx    Esophageal cancer Neg Hx    Rectal cancer Neg Hx    Stomach cancer Neg Hx    Colon polyps Neg Hx     ROS- All systems are reviewed and negative except as per the HPI above  Physical Exam: Vitals:   11/14/20 1053  BP: (!) 150/76  Pulse: 86  Weight: 95.3 kg  Height: 5\' 7"  (1.702 m)   Wt Readings from Last 3 Encounters:  11/14/20 95.3 kg  11/05/20 95.4 kg  10/24/20 97.2 kg    Labs: Lab Results  Component Value Date   NA 139 10/28/2020   K 5.1 10/28/2020   CL 102 10/28/2020    CO2 32 10/28/2020   GLUCOSE 151 (H) 10/28/2020   BUN 14 10/28/2020   CREATININE 0.80 10/28/2020   CALCIUM 8.9 10/28/2020   MG 1.9 10/28/2020   No results found for: INR Lab Results  Component Value Date   CHOL 204 (H) 11/24/2019   HDL 37 (L) 11/24/2019   LDLCALC 142 (H) 11/24/2019  TRIG 123 11/24/2019     GEN- The patient is well appearing, alert and oriented x 3 today.   Head- normocephalic, atraumatic Eyes-  Sclera clear, conjunctiva pink Ears- hearing intact Oropharynx- clear Neck- supple, no JVP Lymph- no cervical lymphadenopathy Lungs- Clear to ausculation bilaterally, normal work of breathing Heart- irregular rate and rhythm, no murmurs, rubs or gallops, PMI not laterally displaced GI- soft, NT, ND, + BS Extremities- no clubbing, cyanosis, or edema MS- no significant deformity or atrophy Skin- no rash or lesion Psych- euthymic mood, full affect Neuro- strength and sensation are intact  EKG-afib at 86 bpm, qrs int 82 ms,  qtc 406 ms     Assessment and Plan:  1. New onset afib Rate controlled and asymptomatic Continue metoprolol 25 mg bid  General education and triggers discussed    2. CHA2DS2VASc  score of 6 ( female, age,  CVA(2), carotid disease)  Continue eliquis 5 mg bid  Bleeding precautions reviewed First full day of anticoagulation was 6/17 Will be eligible for CV after 7/8  Reminded not to miss any doses for the 21 days prior to cardioversion  CV scheduled for 7/14, risk vrs benefit discussed  Cbc/bmet  3. BMI of 32.95  Weight loss and regular exercise encouraged Has stopped smoking  Avoid alcohol  Snoring but no apnea per husband   Will see back 1 week after cardioversion  Butch Penny C. Peta Peachey, [facility] [address] [place], [place] [address] [phone]

## 2020-11-20 ENCOUNTER — Other Ambulatory Visit (HOSPITAL_COMMUNITY): Payer: Self-pay | Admitting: *Deleted

## 2020-11-25 ENCOUNTER — Ambulatory Visit: Payer: Medicare PPO | Admitting: Family Medicine

## 2020-11-28 ENCOUNTER — Ambulatory Visit (HOSPITAL_COMMUNITY)
Admission: RE | Admit: 2020-11-28 | Discharge: 2020-11-28 | Disposition: A | Discharge status: Home / Self Care | Payer: Medicare PPO | Attending: Cardiovascular Disease | Admitting: Cardiovascular Disease

## 2020-11-28 ENCOUNTER — Encounter (HOSPITAL_COMMUNITY): Payer: Self-pay | Admitting: Cardiovascular Disease

## 2020-11-28 ENCOUNTER — Other Ambulatory Visit: Payer: Self-pay

## 2020-11-28 ENCOUNTER — Encounter (HOSPITAL_COMMUNITY)
Admission: RE | Disposition: A | Discharge status: Home / Self Care | Payer: Self-pay | Source: Home / Self Care | Attending: Cardiovascular Disease

## 2020-11-28 ENCOUNTER — Ambulatory Visit (HOSPITAL_COMMUNITY): Payer: Medicare PPO | Admitting: Certified Registered Nurse Anesthetist

## 2020-11-28 DIAGNOSIS — Z7901 Long term (current) use of anticoagulants: Secondary | ICD-10-CM | POA: Insufficient documentation

## 2020-11-28 DIAGNOSIS — Z885 Allergy status to narcotic agent status: Secondary | ICD-10-CM | POA: Insufficient documentation

## 2020-11-28 DIAGNOSIS — I10 Essential (primary) hypertension: Secondary | ICD-10-CM | POA: Diagnosis not present

## 2020-11-28 DIAGNOSIS — I4891 Unspecified atrial fibrillation: Secondary | ICD-10-CM

## 2020-11-28 DIAGNOSIS — J309 Allergic rhinitis, unspecified: Secondary | ICD-10-CM | POA: Diagnosis not present

## 2020-11-28 DIAGNOSIS — E669 Obesity, unspecified: Secondary | ICD-10-CM | POA: Diagnosis not present

## 2020-11-28 DIAGNOSIS — E785 Hyperlipidemia, unspecified: Secondary | ICD-10-CM | POA: Diagnosis not present

## 2020-11-28 DIAGNOSIS — Z881 Allergy status to other antibiotic agents status: Secondary | ICD-10-CM | POA: Diagnosis not present

## 2020-11-28 DIAGNOSIS — Z87891 Personal history of nicotine dependence: Secondary | ICD-10-CM | POA: Diagnosis not present

## 2020-11-28 DIAGNOSIS — R0683 Snoring: Secondary | ICD-10-CM | POA: Insufficient documentation

## 2020-11-28 DIAGNOSIS — Z79899 Other long term (current) drug therapy: Secondary | ICD-10-CM | POA: Diagnosis not present

## 2020-11-28 DIAGNOSIS — Z6832 Body mass index (BMI) 32.0-32.9, adult: Secondary | ICD-10-CM | POA: Insufficient documentation

## 2020-11-28 HISTORY — PX: CARDIOVERSION: SHX1299

## 2020-11-28 LAB — POCT I-STAT, CHEM 8
BUN: 15 mg/dL (ref 8–23)
Calcium, Ion: 1.16 mmol/L (ref 1.15–1.40)
Chloride: 105 mmol/L (ref 98–111)
Creatinine, Ser: 0.6 mg/dL (ref 0.44–1.00)
Glucose, Bld: 134 mg/dL — ABNORMAL HIGH (ref 70–99)
HCT: 47 % — ABNORMAL HIGH (ref 36.0–46.0)
Hemoglobin: 16 g/dL — ABNORMAL HIGH (ref 12.0–15.0)
Potassium: 4.4 mmol/L (ref 3.5–5.1)
Sodium: 140 mmol/L (ref 135–145)
TCO2: 26 mmol/L (ref 22–32)

## 2020-11-28 SURGERY — CARDIOVERSION
Anesthesia: General

## 2020-11-28 MED ORDER — LIDOCAINE 2% (20 MG/ML) 5 ML SYRINGE
INTRAMUSCULAR | Status: DC | PRN
  Administered 2020-11-28: 60 mg via INTRAVENOUS

## 2020-11-28 MED ORDER — SODIUM CHLORIDE 0.9 % IV SOLN: INTRAVENOUS | Status: DC | PRN

## 2020-11-28 MED ORDER — PROPOFOL 10 MG/ML IV BOLUS
INTRAVENOUS | Status: DC | PRN
  Administered 2020-11-28: 40 mg via INTRAVENOUS
  Administered 2020-11-28: 60 mg via INTRAVENOUS

## 2020-11-28 NOTE — Anesthesia Preprocedure Evaluation (Signed)
Anesthesia Evaluation  Patient identified by MRN, date of birth, ID band Patient awake    Reviewed: Allergy & Precautions, H&P , NPO status , Patient's Chart, lab work & pertinent test results, reviewed documented beta blocker date and time   Airway Mallampati: II  TM Distance: >3 FB Neck ROM: Full    Dental no notable dental hx. (+) Teeth Intact, Dental Advisory Given   Pulmonary neg pulmonary ROS, former smoker,    Pulmonary exam normal breath sounds clear to auscultation       Cardiovascular hypertension, Pt. on medications and Pt. on home beta blockers negative cardio ROS  + dysrhythmias Atrial Fibrillation  Rhythm:Regular Rate:Normal     Neuro/Psych Anxiety CVA negative neurological ROS  negative psych ROS   GI/Hepatic negative GI ROS, Neg liver ROS,   Endo/Other  negative endocrine ROS  Renal/GU negative Renal ROS  negative genitourinary   Musculoskeletal  (+) Arthritis , Osteoarthritis,    Abdominal   Peds  Hematology negative hematology ROS (+)   Anesthesia Other Findings   Reproductive/Obstetrics negative OB ROS                             Anesthesia Physical Anesthesia Plan  ASA: 3  Anesthesia Plan: General   Post-op Pain Management:    Induction: Intravenous  PONV Risk Score and Plan: 3 and Propofol infusion and Treatment may vary due to age or medical condition  Airway Management Planned: Mask  Additional Equipment:   Intra-op Plan:   Post-operative Plan:   Informed Consent: I have reviewed the patients History and Physical, chart, labs and discussed the procedure including the risks, benefits and alternatives for the proposed anesthesia with the patient or authorized representative who has indicated his/her understanding and acceptance.     Dental advisory given  Plan Discussed with: CRNA  Anesthesia Plan Comments:         Anesthesia Quick  Evaluation

## 2020-11-28 NOTE — Discharge Instructions (Signed)

## 2020-11-28 NOTE — CV Procedure (Signed)
[place]: On Rx Eliquis Anesthesia: Propofol  DCC x 4 150 J x 1 then 200 J x 3 last two with AP manual compression  Failed to convert to NSR  F/U with [facility] clinic Will need AAT  Jenkins Rouge MD [facility]

## 2020-11-28 NOTE — Transfer of Care (Signed)
Immediate Anesthesia Transfer of Care Note  Patient: Glenda [name]  Procedure(s) Performed: CARDIOVERSION  Patient Location: Endoscopy Unit  Anesthesia Type:General  Level of Consciousness: awake and drowsy  Airway & Oxygen Therapy: Patient Spontanous Breathing  Post-op Assessment: Report given to RN and Post -op Vital signs reviewed and stable  Post vital signs: Reviewed and stable  Last Vitals:  Vitals Value Taken Time  BP    Temp    Pulse    Resp    SpO2      Last Pain:  Vitals:   11/28/20 0755  TempSrc: Temporal  PainSc: 0-No pain         Complications: No notable events documented.

## 2020-11-28 NOTE — Interval H&P Note (Signed)
History and Physical Interval Note:  11/28/2020 7:34 AM  Glenda [name]  has presented today for surgery, with the diagnosis of A-FIB.  The various methods of treatment have been discussed with the patient and family. After consideration of risks, benefits and other options for treatment, the patient has consented to  Procedure(s): CARDIOVERSION (N/A) as a surgical intervention.  The patient's history has been reviewed, patient examined, no change in status, stable for surgery.  I have reviewed the patient's chart and labs.  Questions were answered to the patient's satisfaction.     Jenkins Rouge

## 2020-11-28 NOTE — Anesthesia Postprocedure Evaluation (Signed)
Anesthesia Post Note  Patient: Glenda [name]  Procedure(s) Performed: CARDIOVERSION     Patient location during evaluation: Endoscopy Anesthesia Type: General Level of consciousness: awake and alert Pain management: pain level controlled Vital Signs Assessment: post-procedure vital signs reviewed and stable Respiratory status: spontaneous breathing, nonlabored ventilation and respiratory function stable Cardiovascular status: blood pressure returned to baseline and stable Postop Assessment: no apparent nausea or vomiting Anesthetic complications: no   No notable events documented.  Last Vitals:  Vitals:   11/28/20 0900 11/28/20 0910  BP: 121/62 136/77  Pulse: 68 70  Resp: 18 13  Temp:    SpO2: 98% 99%    Last Pain:  Vitals:   11/28/20 0910  TempSrc:   PainSc: 0-No pain                 Syniah Berne,W. EDMOND

## 2020-12-01 ENCOUNTER — Encounter (HOSPITAL_COMMUNITY): Payer: Self-pay

## 2020-12-04 ENCOUNTER — Ambulatory Visit (HOSPITAL_COMMUNITY)
Admission: RE | Admit: 2020-12-04 | Discharge: 2020-12-04 | Disposition: A | Discharge status: Home / Self Care | Payer: Medicare PPO | Source: Ambulatory Visit | Attending: Nurse Practitioner | Admitting: Nurse Practitioner

## 2020-12-04 ENCOUNTER — Other Ambulatory Visit: Payer: Self-pay

## 2020-12-04 ENCOUNTER — Encounter (HOSPITAL_COMMUNITY): Payer: Self-pay | Admitting: Nurse Practitioner

## 2020-12-04 VITALS — BP 134/70 | HR 84 | Ht 67.0 in | Wt 210.0 lb

## 2020-12-04 DIAGNOSIS — R0683 Snoring: Secondary | ICD-10-CM | POA: Insufficient documentation

## 2020-12-04 DIAGNOSIS — I4819 Other persistent atrial fibrillation: Secondary | ICD-10-CM | POA: Diagnosis not present

## 2020-12-04 DIAGNOSIS — Z6832 Body mass index (BMI) 32.0-32.9, adult: Secondary | ICD-10-CM | POA: Insufficient documentation

## 2020-12-04 DIAGNOSIS — Z713 Dietary counseling and surveillance: Secondary | ICD-10-CM | POA: Diagnosis not present

## 2020-12-04 DIAGNOSIS — Z885 Allergy status to narcotic agent status: Secondary | ICD-10-CM | POA: Insufficient documentation

## 2020-12-04 DIAGNOSIS — E669 Obesity, unspecified: Secondary | ICD-10-CM | POA: Insufficient documentation

## 2020-12-04 DIAGNOSIS — Z7901 Long term (current) use of anticoagulants: Secondary | ICD-10-CM | POA: Diagnosis not present

## 2020-12-04 DIAGNOSIS — Z87891 Personal history of nicotine dependence: Secondary | ICD-10-CM | POA: Insufficient documentation

## 2020-12-04 DIAGNOSIS — R21 Rash and other nonspecific skin eruption: Secondary | ICD-10-CM | POA: Insufficient documentation

## 2020-12-04 DIAGNOSIS — D6869 Other thrombophilia: Secondary | ICD-10-CM | POA: Diagnosis not present

## 2020-12-04 DIAGNOSIS — I4891 Unspecified atrial fibrillation: Secondary | ICD-10-CM

## 2020-12-04 NOTE — Progress Notes (Addendum)
Primary Care Physician: Martinique, Betty G, MD Referring Physician: [facility] f/u    Glenda [name] is a 70 y.o. adult with a h/o new onset afib that is in the afib clinic for f/u hosptial stay 6/13. She had neck surgery at that time. Afib was noted on pre op EKG and also at time of echo in April that was done for pre op clearance, although pt was not notified. While  in the hospital for neck surgery, afib was present and she was consulted by cardiology. She was started on BB for rate control and with CHA2DS2VASc score of 6, she was started on eliquis 72 hours after surgery. She was not aware of her afib.   In the office today, she remains in afib rate controlled. She reports that she has quit smoking. She drinks 2 alcoholic drinks a week. Husband reports that she has had snoring and apnea in the past but he does not note apnea now although she still has some snoring. She does not exercise.   F/u afib clinic, 6/30, remains in a fib at 86 bpm. She will be able to have cardioversion after 7/8 to allow for adequate anticoagulation. She has not missed any anticoagulation since start of drug, 6/17. She is tolerating well.   F/u in afib clinic 12/04/20. Unfortunately, cardioversion did not convert pt despite multiple shocks prior with manual compression. She is here to discuss options . She has developed a rash of her neck, arms torso that was there to a small degree prior to CV but has gotten worse since. Benadryl has not helped with the itching. Received propofol with CV and  in the past for which she has not shown to be allergic.   Today, she denies symptoms of palpitations, chest pain, shortness of breath, orthopnea, PND, lower extremity edema, dizziness, presyncope, syncope, or neurologic sequela. The patient is tolerating medications without difficulties and is otherwise without complaint today.   Past Medical History:  Diagnosis Date   Allergic rhinitis    Allergy    Anxiety    Arthritis    Cataract     Heart murmur    History of colonic polyps    History of PSVT (paroxysmal supraventricular tachycardia)    Hypertension    Impaired glucose tolerance    Obesity    PAT (paroxysmal atrial tachycardia) (HCC)    Pre-diabetes    hx, not currently being monitor for diabetes, diet controlled   Stroke ([place]) 08/2020   Scan revealed stroke in history   Tobacco user    Vertigo    started 3 weeks ago- after fluid trapped in ear   Past Surgical History:  Procedure Laterality Date   ankle sx-left fx     ANTERIOR CERVICAL DECOMP/DISCECTOMY FUSION N/A 10/28/2020   Procedure: Anterior Cervical Decompression Fusion - Cervical Four-Cervical Five - Cervical Five-Cervical Six - Cervical Six- Cervical Seven;  Surgeon: Kary Kos, MD;  Location: [facility];  Service: Neurosurgery;  Laterality: N/A;  Anterior Cervical Decompression Fusion - Cervical Four-Cervical Five - Cervical Five-Cervical Six - Cervical Six- Cervical Seven   CARDIOVERSION N/A 11/28/2020   Procedure: CARDIOVERSION;  Surgeon: Josue Hector, MD;  Location[facility] ENDOSCOPY;  Service: Cardiovascular;  Laterality: N/A;   CATARACT EXTRACTION     both eyes   CHOLECYSTECTOMY N/A 01/11/2018   Procedure: LAPAROSCOPIC CHOLECYSTECTOMY WITH INTRAOPERATIVE CHOLANGIOGRAM ERAS PATHWAY;  Surgeon: Alphonsa Overall, MD;  Location: [facility];  Service: General;  Laterality: N/A;   COLONOSCOPY  04/23/2005  DIAGNOSTIC LAPAROSCOPY     EYE SURGERY     foot sx     with the ankle surgery   POLYPECTOMY      Current Outpatient Medications  Medication Sig Dispense Refill   acetaminophen (TYLENOL) 500 MG tablet Take 1,000 mg by mouth 2 (two) times daily as needed for moderate pain.     ALPRAZolam (XANAX) 0.25 MG tablet Take 0.5-1 tablets (0.125-0.25 mg total) by mouth daily as needed for anxiety. 30 tablet 0   apixaban (ELIQUIS) 5 MG TABS tablet Take 1 tablet (5 mg total) by mouth 2 (two) times daily. First dose 11/01/20. 180 tablet 3   calamine lotion Apply 1 application  topically as needed for itching.     diphenhydrAMINE (BENADRYL) 25 MG tablet Take 25 mg by mouth daily as needed for allergies.     hydrocortisone cream 0.5 % Apply 1 application topically 2 (two) times daily.     metoprolol succinate (TOPROL-XL) 25 MG 24 hr tablet Take 1 tablet (25 mg total) by mouth 2 (two) times daily. 180 tablet 3   PARoxetine (PAXIL) 40 MG tablet Take 1 tablet (40 mg total) by mouth daily. 30 tablet 0   No current facility-administered medications for this encounter.    Allergies  Allergen Reactions   Amoxicillin Rash and Other (See Comments)    Has patient had a PCN reaction causing immediate rash, facial/tongue/throat swelling, SOB or lightheadedness with hypotension: No HAS PT DEVELOPED SEVERE RASH INVOLVING MUCUS MEMBRANES or SKIN NECROSIS: #  #  YES  #  #  Has patient had a PCN reaction that required hospitalization: No Has patient had a PCN reaction occurring within the last 10 years: No    Codeine Other (See Comments)    Hallucinations   Erythromycin Other (See Comments)    Upset stomach    Social History   Socioeconomic History   Marital status: Married    Spouse name: Not on file   Number of children: 2   Years of education: college   Highest education level: Not on file  Occupational History   Occupation: Retired  Tobacco Use   Smoking status: Former    Packs/day: 0.50    Types: Cigarettes    Quit date: 10/17/2020    Years since quitting: 0.1   Smokeless tobacco: Never  Vaping Use   Vaping Use: Never used  Substance and Sexual Activity   Alcohol use: Yes    Alcohol/week: 2.0 standard drinks    Types: 2 Standard drinks or equivalent per week    Comment: weekends   Drug use: No   Sexual activity: Yes  Other Topics Concern   Not on file  Social History Narrative   Lives at home with her husband.   Right-handed.   One cup caffeine per day.   Social Determinants of Health   Financial Resource Strain: Not on file  Food Insecurity: Not  on file  Transportation Needs: Not on file  Physical Activity: Not on file  Stress: Not on file  Social Connections: Not on file  Intimate Partner Violence: Not on file    Family History  Problem Relation Age of Onset   Healthy Mother    Dementia Father    Healthy Sister    Healthy Sister    Alzheimer's disease Other    Colon cancer Neg Hx    Esophageal cancer Neg Hx    Rectal cancer Neg Hx    Stomach cancer Neg Hx  Colon polyps Neg Hx     ROS- All systems are reviewed and negative except as per the HPI above  Physical Exam: Vitals:   12/04/20 1130  BP: 134/70  Pulse: 84  Weight: 95.3 kg  Height: 5\' 7"  (1.702 m)   Wt Readings from Last 3 Encounters:  12/04/20 95.3 kg  11/28/20 95.3 kg  11/14/20 95.3 kg    Labs: Lab Results  Component Value Date   NA 140 11/28/2020   K 4.4 11/28/2020   CL 105 11/28/2020   CO2 28 11/14/2020   GLUCOSE 134 (H) 11/28/2020   BUN 15 11/28/2020   CREATININE 0.60 11/28/2020   CALCIUM 9.3 11/14/2020   MG 1.9 10/28/2020   No results found for: INR Lab Results  Component Value Date   CHOL 204 (H) 11/24/2019   HDL 37 (L) 11/24/2019   LDLCALC 142 (H) 11/24/2019   TRIG 123 11/24/2019     GEN- The patient is well appearing, alert and oriented x 3 today.   Head- normocephalic, atraumatic Eyes-  Sclera clear, conjunctiva pink Ears- hearing intact Oropharynx- clear Neck- supple, no JVP Lymph- no cervical lymphadenopathy Lungs- Clear to ausculation bilaterally, normal work of breathing Heart- irregular rate and rhythm, no murmurs, rubs or gallops, PMI not laterally displaced GI- soft, NT, ND, + BS Extremities- no clubbing, cyanosis, or edema MS- no significant deformity or atrophy Skin- no rash or lesion Psych- euthymic mood, full affect Neuro- strength and sensation are intact  EKG-afib at 86 bpm, qrs int 82 ms,  qtc 406 ms     Assessment and Plan:  1. New onset afib Rate controlled and asymptomatic Continue  metoprolol 25 mg bid   We discussed options going forward. I feel flecainide, or tiksoyn may be best options for her to restore SR.  If flecainide is used, then will need a stress test. Echo showed normal EF.    The option of living in afib was also discussed as pt is not sure if she wants to do anything else at this time.   We discussed getting an echo early November and f/u in the office afterward to further discuss.  2. CHA2DS2VASc  score of 6 ( female, age,  CVA(2), carotid disease)  Continue eliquis 5 mg bid   3. BMI of 32.95  Weight loss and regular exercise encouraged Has stopped smoking  Avoid alcohol  Snoring but no apnea per husband   4. Rash  See PCP for treatment of rash   Will see  in early fall, sooner if needed   Butch Penny C. Raine Blodgett, [facility] [address] [place], [place] [address] [phone]

## 2020-12-05 DIAGNOSIS — M542 Cervicalgia: Secondary | ICD-10-CM | POA: Diagnosis not present

## 2020-12-05 DIAGNOSIS — I10 Essential (primary) hypertension: Secondary | ICD-10-CM | POA: Diagnosis not present

## 2020-12-05 DIAGNOSIS — Z6832 Body mass index (BMI) 32.0-32.9, adult: Secondary | ICD-10-CM | POA: Diagnosis not present

## 2020-12-11 ENCOUNTER — Encounter: Payer: Self-pay | Admitting: Family Medicine

## 2020-12-13 ENCOUNTER — Other Ambulatory Visit: Payer: Self-pay | Admitting: Family Medicine

## 2020-12-13 MED ORDER — PAROXETINE HCL 40 MG PO TABS: 40.0000 mg | ORAL_TABLET | Freq: Every day | ORAL | 2 refills | Status: DC

## 2021-01-03 ENCOUNTER — Encounter (HOSPITAL_COMMUNITY): Payer: Self-pay

## 2021-01-16 ENCOUNTER — Other Ambulatory Visit (HOSPITAL_COMMUNITY): Payer: Self-pay | Admitting: Nurse Practitioner

## 2021-01-16 ENCOUNTER — Encounter (HOSPITAL_COMMUNITY): Payer: Self-pay | Admitting: Nurse Practitioner

## 2021-01-16 ENCOUNTER — Ambulatory Visit (HOSPITAL_COMMUNITY)
Admission: RE | Admit: 2021-01-16 | Discharge: 2021-01-16 | Disposition: A | Discharge status: Home / Self Care | Payer: Medicare PPO | Source: Ambulatory Visit | Attending: Nurse Practitioner | Admitting: Nurse Practitioner

## 2021-01-16 ENCOUNTER — Other Ambulatory Visit: Payer: Self-pay

## 2021-01-16 VITALS — BP 160/76 | HR 77 | Ht 67.0 in | Wt 214.4 lb

## 2021-01-16 DIAGNOSIS — Z79899 Other long term (current) drug therapy: Secondary | ICD-10-CM | POA: Diagnosis not present

## 2021-01-16 DIAGNOSIS — I4891 Unspecified atrial fibrillation: Secondary | ICD-10-CM | POA: Diagnosis not present

## 2021-01-16 DIAGNOSIS — Z87891 Personal history of nicotine dependence: Secondary | ICD-10-CM | POA: Insufficient documentation

## 2021-01-16 DIAGNOSIS — Z6832 Body mass index (BMI) 32.0-32.9, adult: Secondary | ICD-10-CM | POA: Insufficient documentation

## 2021-01-16 DIAGNOSIS — I4819 Other persistent atrial fibrillation: Secondary | ICD-10-CM | POA: Diagnosis not present

## 2021-01-16 DIAGNOSIS — R0683 Snoring: Secondary | ICD-10-CM | POA: Diagnosis not present

## 2021-01-16 DIAGNOSIS — E669 Obesity, unspecified: Secondary | ICD-10-CM | POA: Insufficient documentation

## 2021-01-16 DIAGNOSIS — D6869 Other thrombophilia: Secondary | ICD-10-CM

## 2021-01-16 DIAGNOSIS — Z09 Encounter for follow-up examination after completed treatment for conditions other than malignant neoplasm: Secondary | ICD-10-CM | POA: Diagnosis not present

## 2021-01-16 DIAGNOSIS — Z7901 Long term (current) use of anticoagulants: Secondary | ICD-10-CM | POA: Diagnosis not present

## 2021-01-16 MED ORDER — DILTIAZEM HCL ER COATED BEADS 120 MG PO CP24: 120.0000 mg | ORAL_CAPSULE | Freq: Every day | ORAL | 11 refills | Status: DC

## 2021-01-16 MED ORDER — APIXABAN 5 MG PO TABS: 5.0000 mg | ORAL_TABLET | Freq: Two times a day (BID) | ORAL | 3 refills | Status: DC

## 2021-01-16 NOTE — Progress Notes (Addendum)
Primary Care Physician: Martinique, Betty G, MD Referring Physician: [facility] f/u    Glenda [name] is a 70 y.o. adult with a h/o new onset afib that is in the afib clinic for f/u hosptial stay 6/13. She had neck surgery at that time. Afib was noted on pre op EKG and also at time of echo in April that was done for pre op clearance, although pt was not notified. While  in the hospital for neck surgery, afib was present and she was consulted by cardiology. She was started on BB for rate control and with CHA2DS2VASc score of 6, she was started on eliquis 72 hours after surgery. She was not aware of her afib.   In the office today, she remains in afib rate controlled. She reports that she has quit smoking. She drinks 2 alcoholic drinks a week. Husband reports that she has had snoring and apnea in the past but he does not note apnea now although she still has some snoring. She does not exercise.   F/u afib clinic, 6/30, remains in a fib at 86 bpm. She will be able to have cardioversion after 7/8 to allow for adequate anticoagulation. She has not missed any anticoagulation since start of drug, 6/17. She is tolerating well.   F/u in afib clinic 12/04/20. Unfortunately, cardioversion did not convert pt despite multiple shocks prior with manual compression. She is here to discuss options. She has developed a rash of her neck, arms torso that was there to a small degree prior to CV but has gotten worse since. Benadryl has not helped with the itching. Received propofol with CV and  in the past  has not shown to be allergic.   F/u in afib clinic, 01/16/21. She is in rate controlled afib today. She feels she may be going in and out. She  feels the BB is making her fatigued and teary at times. Discussed switching to cardizem daily. She brings in BP readings for the last several weeks. Overall BP looks well controlled. She would like to see what the monitor shows before she firmly makes her mind to pursue antiarrythmic's.    Today, she denies symptoms of palpitations, chest pain, shortness of breath, orthopnea, PND, lower extremity edema, dizziness, presyncope, syncope, or neurologic sequela. The patient is tolerating medications without difficulties and is otherwise without complaint today.   Past Medical History:  Diagnosis Date   Allergic rhinitis    Allergy    Anxiety    Arthritis    Cataract    Heart murmur    History of colonic polyps    History of PSVT (paroxysmal supraventricular tachycardia)    Hypertension    Impaired glucose tolerance    Obesity    PAT (paroxysmal atrial tachycardia) (HCC)    Pre-diabetes    hx, not currently being monitor for diabetes, diet controlled   Stroke ([place]) 08/2020   Scan revealed stroke in history   Tobacco user    Vertigo    started 3 weeks ago- after fluid trapped in ear   Past Surgical History:  Procedure Laterality Date   ankle sx-left fx     ANTERIOR CERVICAL DECOMP/DISCECTOMY FUSION N/A 10/28/2020   Procedure: Anterior Cervical Decompression Fusion - Cervical Four-Cervical Five - Cervical Five-Cervical Six - Cervical Six- Cervical Seven;  Surgeon: Kary Kos, MD;  Location: [facility] [facility];  Service: Neurosurgery;  Laterality: N/A;  Anterior Cervical Decompression Fusion - Cervical Four-Cervical Five - Cervical Five-Cervical Six - Cervical Six- Cervical Seven  CARDIOVERSION N/A 11/28/2020   Procedure: CARDIOVERSION;  Surgeon: Josue Hector, MD;  Location: [facility] ENDOSCOPY;  Service: Cardiovascular;  Laterality: N/A;   CATARACT EXTRACTION     both eyes   CHOLECYSTECTOMY N/A 01/11/2018   Procedure: LAPAROSCOPIC CHOLECYSTECTOMY WITH INTRAOPERATIVE CHOLANGIOGRAM ERAS PATHWAY;  Surgeon: Alphonsa Overall, MD;  Location: [facility];  Service: General;  Laterality: N/A;   COLONOSCOPY  04/23/2005   DIAGNOSTIC LAPAROSCOPY     EYE SURGERY     foot sx     with the ankle surgery   POLYPECTOMY      Current Outpatient Medications  Medication Sig Dispense Refill   acetaminophen  (TYLENOL) 500 MG tablet Take 1,000 mg by mouth 2 (two) times daily as needed for moderate pain.     ALPRAZolam (XANAX) 0.25 MG tablet Take 0.5-1 tablets (0.125-0.25 mg total) by mouth daily as needed for anxiety. 30 tablet 0   calamine lotion Apply 1 application topically as needed for itching.     diltiazem (CARDIZEM CD) 120 MG 24 hr capsule Take 1 capsule (120 mg total) by mouth daily. 30 capsule 11   diphenhydrAMINE (BENADRYL) 25 MG tablet Take 25 mg by mouth daily as needed for allergies.     hydrocortisone cream 0.5 % Apply 1 application topically 2 (two) times daily.     PARoxetine (PAXIL) 40 MG tablet Take 1 tablet (40 mg total) by mouth daily. 30 tablet 2   apixaban (ELIQUIS) 5 MG TABS tablet Take 1 tablet (5 mg total) by mouth 2 (two) times daily. First dose 11/01/20. 180 tablet 3   No current facility-administered medications for this encounter.    Allergies  Allergen Reactions   Amoxicillin Rash and Other (See Comments)    Has patient had a PCN reaction causing immediate rash, facial/tongue/throat swelling, SOB or lightheadedness with hypotension: No HAS PT DEVELOPED SEVERE RASH INVOLVING MUCUS MEMBRANES or SKIN NECROSIS: #  #  YES  #  #  Has patient had a PCN reaction that required hospitalization: No Has patient had a PCN reaction occurring within the last 10 years: No    Codeine Other (See Comments)    Hallucinations   Erythromycin Other (See Comments)    Upset stomach    Social History   Socioeconomic History   Marital status: Married    Spouse name: Not on file   Number of children: 2   Years of education: college   Highest education level: Not on file  Occupational History   Occupation: Retired  Tobacco Use   Smoking status: Former    Packs/day: 0.50    Types: Cigarettes    Quit date: 10/17/2020    Years since quitting: 0.2   Smokeless tobacco: Never  Vaping Use   Vaping Use: Never used  Substance and Sexual Activity   Alcohol use: Yes    Alcohol/week: 2.0  standard drinks    Types: 2 Standard drinks or equivalent per week    Comment: weekends   Drug use: No   Sexual activity: Yes  Other Topics Concern   Not on file  Social History Narrative   Lives at home with her husband.   Right-handed.   One cup caffeine per day.   Social Determinants of Health   Financial Resource Strain: Not on file  Food Insecurity: Not on file  Transportation Needs: Not on file  Physical Activity: Not on file  Stress: Not on file  Social Connections: Not on file  Intimate Partner Violence: Not on file  Family History  Problem Relation Age of Onset   Healthy Mother    Dementia Father    Healthy Sister    Healthy Sister    Alzheimer's disease Other    Colon cancer Neg Hx    Esophageal cancer Neg Hx    Rectal cancer Neg Hx    Stomach cancer Neg Hx    Colon polyps Neg Hx     ROS- All systems are reviewed and negative except as per the HPI above  Physical Exam: Vitals:   01/16/21 1022  BP: (!) 160/76  Pulse: 77  Weight: 97.3 kg  Height: '5\' 7"'$  (1.702 m)   Wt Readings from Last 3 Encounters:  01/16/21 97.3 kg  12/04/20 95.3 kg  11/28/20 95.3 kg    Labs: Lab Results  Component Value Date   NA 140 11/28/2020   K 4.4 11/28/2020   CL 105 11/28/2020   CO2 28 11/14/2020   GLUCOSE 134 (H) 11/28/2020   BUN 15 11/28/2020   CREATININE 0.60 11/28/2020   CALCIUM 9.3 11/14/2020   MG 1.9 10/28/2020   No results found for: INR Lab Results  Component Value Date   CHOL 204 (H) 11/24/2019   HDL 37 (L) 11/24/2019   LDLCALC 142 (H) 11/24/2019   TRIG 123 11/24/2019     GEN- The patient is well appearing, alert and oriented x 3 today.   Head- normocephalic, atraumatic Eyes-  Sclera clear, conjunctiva pink Ears- hearing intact Oropharynx- clear Neck- supple, no JVP Lymph- no cervical lymphadenopathy Lungs- Clear to ausculation bilaterally, normal work of breathing Heart- irregular rate and rhythm, no murmurs, rubs or gallops, PMI not  laterally displaced GI- soft, NT, ND, + BS Extremities- no clubbing, cyanosis, or edema MS- no significant deformity or atrophy Skin- no rash or lesion Psych- euthymic mood, full affect Neuro- strength and sensation are intact  EKG-afib at 77 bpm, qrs int 82 ms, qtc 407 ms   Echo- 08/24/20-1. Left ventricular ejection fraction, by estimation, is 60 to 65%. The  left ventricle has normal function. The left ventricle has no regional  wall motion abnormalities. There is mild left ventricular hypertrophy.  Left ventricular diastolic parameters  are indeterminate.   2. Right ventricular systolic function is normal. The right ventricular  size is normal. Tricuspid regurgitation signal is inadequate for assessing  PA pressure.   3. The mitral valve is normal in structure. No evidence of mitral valve  regurgitation.   4. The aortic valve was not well visualized. Aortic valve regurgitation  is not visualized. No aortic stenosis is present.   5. The inferior vena cava is normal in size with greater than 50%  respiratory variability, suggesting right atrial pressure of 3 mmHg.   6. Rhythm is irregular and no clear A-wave seen on mitral inflow Doppler,  concerning for atrial fibrillation. Recommend evaluating for Afib with  cardiac monitor    Assessment and Plan:  1. New onset afib, first dx  spring of 2022 Rate controlled but feels that she is not tolerating BB with fatigue and being weepy at times  Stop  metoprolol 25 mg bid and start Cardizem 120 mg daily   We discussed options going forward.she feels that she is in SR at times. Will place a zio patch x 2 weeks and see pt back at a later day and further discuss if she prefers trying to restore  SR   2. CHA2DS2VASc  score of 6 ( female, age,  CVA(2), carotid disease)  Continue eliquis 5 mg bid   3. BMI of 32.95  Weight loss and regular exercise encouraged Has stopped smoking  Avoid alcohol  Snoring but no apnea per husband   4. Rash    Resolved that was present after last CV  Will see  back in one month  Butch Penny C. Heyden Jaber, [facility] [address] [place], [place] [address] [phone]

## 2021-01-16 NOTE — Addendum Note (Signed)
Encounter addended by: Sherran Needs, NP on: 01/16/2021 11:59 AM  Actions taken: Clinical Note Signed

## 2021-01-16 NOTE — Addendum Note (Signed)
Encounter addended by: Sherran Needs, NP on: 01/16/2021 12:00 PM  Actions taken: Clinical Note Signed

## 2021-01-21 ENCOUNTER — Other Ambulatory Visit: Payer: Self-pay | Admitting: Neurosurgery

## 2021-01-21 DIAGNOSIS — I739 Peripheral vascular disease, unspecified: Secondary | ICD-10-CM | POA: Diagnosis not present

## 2021-01-21 DIAGNOSIS — G629 Polyneuropathy, unspecified: Secondary | ICD-10-CM | POA: Diagnosis not present

## 2021-01-21 DIAGNOSIS — M542 Cervicalgia: Secondary | ICD-10-CM | POA: Diagnosis not present

## 2021-01-21 DIAGNOSIS — Z6833 Body mass index (BMI) 33.0-33.9, adult: Secondary | ICD-10-CM | POA: Diagnosis not present

## 2021-01-27 ENCOUNTER — Other Ambulatory Visit (HOSPITAL_COMMUNITY): Payer: Self-pay | Admitting: *Deleted

## 2021-01-27 MED ORDER — METOPROLOL SUCCINATE ER 25 MG PO TB24: 25.0000 mg | ORAL_TABLET | Freq: Every day | ORAL | Status: DC

## 2021-01-28 ENCOUNTER — Ambulatory Visit
Admission: RE | Admit: 2021-01-28 | Discharge: 2021-01-28 | Disposition: A | Discharge status: Home / Self Care | Payer: Medicare PPO | Source: Ambulatory Visit | Attending: Neurosurgery | Admitting: Neurosurgery

## 2021-01-28 DIAGNOSIS — I10 Essential (primary) hypertension: Secondary | ICD-10-CM | POA: Diagnosis not present

## 2021-01-28 DIAGNOSIS — I739 Peripheral vascular disease, unspecified: Secondary | ICD-10-CM

## 2021-02-06 DIAGNOSIS — I4819 Other persistent atrial fibrillation: Secondary | ICD-10-CM | POA: Diagnosis not present

## 2021-02-06 NOTE — Addendum Note (Signed)
Encounter addended by: Juluis Mire, RN on: 02/06/2021 11:34 AM  Actions taken: Imaging Exam ended

## 2021-02-17 NOTE — Progress Notes (Signed)
HPI: Glenda [name] is a pleasant 70 y.o. female, who is here today for her AWV and follow up.  Last AWV 11/24/19  Regular exercise 3 or more time per week: Not regularly. Following a healthy diet: She watch carbs but has not been consistent with following low fat diet.  Chronic medical problems: Allergies,anxiety, atrial fib,HLD,HTN,and cervicalgia among some.  She lives with her husband..  Functional Status Survey: Is the patient deaf or have difficulty hearing?: No Does the patient have difficulty seeing, even when wearing glasses/contacts?: No Does the patient have difficulty concentrating, remembering, or making decisions?: No Does the patient have difficulty walking or climbing stairs?: No Does the patient have difficulty dressing or bathing?: No Does the patient have difficulty doing errands alone such as visiting a doctor's office or shopping?: No  Fall Risk  02/18/2021 11/24/2019 05/01/2016 03/31/2013 03/31/2013  Falls in the past year? 1 0 No No No  Number falls in past yr: 0 0 - - -  Injury with Fall? 0 0 - - -  Follow up Education provided Education provided - - -   Providers she sees regularly: Neuro: Dr Krista Blue, last seen on 09/26/20. Neurosurgeon: Dr Saintclair Halsted. Cardiologist: Dr. Johnsie Cancel and Doristine Devoid. She has an appt today to follow on atrial fibrillation. Eye care provider: Dr. Gershon Crane, last seen the eye doctor a few years ago.  She is planning on arranging appointment.  Depression screen PHQ 2/9 02/18/2021  Decreased Interest 0  Down, Depressed, Hopeless 0  PHQ - 2 Score 0   Vision Screening   Right eye Left eye Both eyes  Without correction 20/30 20/30 20/25   With correction       Mini-Cog - 02/18/21 0939     Normal clock drawing test? yes    How many words correct? 3            Immunization History  Administered Date(s) Administered   Fluad Quad(high Dose 65+) 01/29/2020, 02/18/2021   Influenza Split 03/13/2011, 03/18/2012   Influenza Whole  02/15/2006, 04/19/2007   Influenza, High Dose Seasonal PF 04/02/2016, 03/17/2017, 03/11/2018   Influenza,inj,Quad PF,6+ Mos 03/31/2013, 04/06/2014, 03/22/2015, 02/11/2019   PFIZER(Purple Top)SARS-COV-2 Vaccination 06/23/2019, 07/19/2019, 02/15/2020   Pneumococcal Conjugate-13 05/01/2016   Pneumococcal Polysaccharide-23 06/07/2017   Tdap 03/13/2011   Mammogram: 02/13/20 Colonoscopy:02/29/20. DEXA: 01/30/20 osteopenia.  Hep C screening: 06/07/17 NR.   Since her last visit she has followed with cardiologist, dx'ed with atrial fib. She underwent cardioversion, unsuccessful. She is on Eliquis 5 mg bid,Metoprolol succinate 25 mg daily,and Diltiazem 120 mg daily.  Anxiety: She is on Paxil 40 mg daily and Alprazolam 0.25 mg daily as needed.  Alprazolam was prescribed by her neurosurgeon, Dr Saintclair Halsted. She takes it seldom. Her mother died in 2020/12/05. Sleeping about 7 hours.  HLD: She is on non pharmacologic treatment.  Lab Results  Component Value Date   CHOL 204 (H) 11/24/2019   HDL 37 (L) 11/24/2019   LDLCALC 142 (H) 11/24/2019   LDLDIRECT 170.5 06/06/2010   TRIG 123 11/24/2019   CHOLHDL 5.5 (H) 11/24/2019   Headache: 2 weeks ago she started with right retro-ocular pain, achy like intense pain, 6-7/10. + Photophobia. It last a couple hours even if she does not take medication. It happens in the afternoon. Almost daily but has not had any in 2 days.  No associated nausea or vomiting. Brain MRI done on 09/02/2020: 1. Punctate focus of DWI hyperintensity in the high left parasagittal frontal cortex, which may represent  a small acute/subacute infarct versus artifact. No associated edema or mass effect. 2. Scattered mild to moderate for age T2/FLAIR hyperintensities in the white matter, nonspecific but most likely related to chronic microvascular ischemic disease. 3. Remote right cerebellar lacunar infarct.  No visual changes but eye is "bloodshot." She is on chronic anticoagulation with  Eliquis. She has not noted nosebleed, gum bleed, blood in the stool, more bruising than usual, or gross hematuria.  She has taken pain medication prescribed after her neck surgery as needed for headache, not frequent.  She is planning on arranging appt with her eye care provider.  Sometimes headache is associated with right earache and nasal congestion. She takes Benadryl for allergies.  Toes intermittent numbness that started 2 days after surgery, cervical myelopathy. ABI was "fine" , 01/28/21. Pending EMG-NCS.  Feeling like LE's are weak for the past few months, feeling like "shaking." She noted problem before cervical spine surgery.  Glucose has been elevated , 134. Negative for polydipsia,polyuria, or polyphagia. HgA1C has been 6.5 a few years ago.  Lab Results  Component Value Date   HGBA1C 5.3 07/18/2018   Review of Systems  Constitutional:  Negative for activity change, appetite change and fever.  HENT:  Negative for mouth sores and sore throat.   Eyes:  Negative for discharge and itching.  Respiratory:  Negative for cough, shortness of breath and wheezing.   Cardiovascular:  Negative for chest pain, palpitations and leg swelling.  Gastrointestinal:  Negative for abdominal pain, nausea and vomiting.       Negative for changes in bowel habits.  Endocrine: Negative for cold intolerance and heat intolerance.  Genitourinary:  Negative for decreased urine volume, dysuria and hematuria.  Skin:  Negative for pallor and rash.  Allergic/Immunologic: Positive for environmental allergies.  Neurological:  Negative for syncope and facial asymmetry.  Psychiatric/Behavioral:  Negative for confusion.    Current Outpatient Medications on File Prior to Visit  Medication Sig Dispense Refill   acetaminophen (TYLENOL) 500 MG tablet Take 1,000 mg by mouth 2 (two) times daily as needed for moderate pain.     ALPRAZolam (XANAX) 0.25 MG tablet Take 0.5-1 tablets (0.125-0.25 mg total) by mouth  daily as needed for anxiety. 30 tablet 0   apixaban (ELIQUIS) 5 MG TABS tablet Take 1 tablet (5 mg total) by mouth 2 (two) times daily. First dose 11/01/20. 180 tablet 3   calamine lotion Apply 1 application topically as needed for itching.     diltiazem (CARDIZEM CD) 120 MG 24 hr capsule Take 1 capsule (120 mg total) by mouth daily. 30 capsule 11   diphenhydrAMINE (BENADRYL) 25 MG tablet Take 25 mg by mouth daily as needed for allergies.     hydrocortisone cream 0.5 % Apply 1 application topically 2 (two) times daily.     metoprolol succinate (TOPROL XL) 25 MG 24 hr tablet Take 1 tablet (25 mg total) by mouth at bedtime.     [DISCONTINUED] metoprolol tartrate (LOPRESSOR) 25 MG tablet Take 1 tablet (25 mg total) by mouth 3 (three) times daily. 90 tablet 0   No current facility-administered medications on file prior to visit.   Past Medical History:  Diagnosis Date   Allergic rhinitis    Allergy    Anxiety    Arthritis    Cataract    Heart murmur    History of colonic polyps    History of PSVT (paroxysmal supraventricular tachycardia)    Hypertension    Impaired glucose tolerance  Obesity    PAT (paroxysmal atrial tachycardia) (HCC)    Pre-diabetes    hx, not currently being monitor for diabetes, diet controlled   Stroke [facility]) 08/2020   Scan revealed stroke in history   Tobacco user    Vertigo    started 3 weeks ago- after fluid trapped in ear    Past Surgical History:  Procedure Laterality Date   ankle sx-left fx     ANTERIOR CERVICAL DECOMP/DISCECTOMY FUSION N/A 10/28/2020   Procedure: Anterior Cervical Decompression Fusion - Cervical Four-Cervical Five - Cervical Five-Cervical Six - Cervical Six- Cervical Seven;  Surgeon: Kary Kos, MD;  Location: [place];  Service: Neurosurgery;  Laterality: N/A;  Anterior Cervical Decompression Fusion - Cervical Four-Cervical Five - Cervical Five-Cervical Six - Cervical Six- Cervical Seven   CARDIOVERSION N/A 11/28/2020   Procedure:  CARDIOVERSION;  Surgeon: Josue Hector, MD;  Location: [facility] ENDOSCOPY;  Service: Cardiovascular;  Laterality: N/A;   CATARACT EXTRACTION     both eyes   CHOLECYSTECTOMY N/A 01/11/2018   Procedure: LAPAROSCOPIC CHOLECYSTECTOMY WITH INTRAOPERATIVE CHOLANGIOGRAM ERAS PATHWAY;  Surgeon: Alphonsa Overall, MD;  Location: [place];  Service: General;  Laterality: N/A;   COLONOSCOPY  04/23/2005   DIAGNOSTIC LAPAROSCOPY     EYE SURGERY     foot sx     with the ankle surgery   POLYPECTOMY      Allergies  Allergen Reactions   Amoxicillin Rash and Other (See Comments)    Has patient had a PCN reaction causing immediate rash, facial/tongue/throat swelling, SOB or lightheadedness with hypotension: No HAS PT DEVELOPED SEVERE RASH INVOLVING MUCUS MEMBRANES or SKIN NECROSIS: #  #  YES  #  #  Has patient had a PCN reaction that required hospitalization: No Has patient had a PCN reaction occurring within the last 10 years: No    Erythromycin Other (See Comments)    Upset stomach    Family History  Problem Relation Age of Onset   Healthy Mother    Dementia Father    Healthy Sister    Healthy Sister    Alzheimer's disease Other    Colon cancer Neg Hx    Esophageal cancer Neg Hx    Rectal cancer Neg Hx    Stomach cancer Neg Hx    Colon polyps Neg Hx     Social History   Socioeconomic History   Marital status: Married    Spouse name: Not on file   Number of children: 2   Years of education: college   Highest education level: Not on file  Occupational History   Occupation: Retired  Tobacco Use   Smoking status: Former    Packs/day: 0.50    Types: Cigarettes    Quit date: 10/17/2020    Years since quitting: 0.3   Smokeless tobacco: Never  Vaping Use   Vaping Use: Never used  Substance and Sexual Activity   Alcohol use: Yes    Alcohol/week: 2.0 standard drinks    Types: 2 Standard drinks or equivalent per week    Comment: weekends   Drug use: No   Sexual activity: Yes  Other Topics  Concern   Not on file  Social History Narrative   Lives at home with her husband.   Right-handed.   One cup caffeine per day.   Social Determinants of Health   Financial Resource Strain: Not on file  Food Insecurity: Not on file  Transportation Needs: Not on file  Physical Activity: Not on file  Stress: Not on file  Social Connections: Not on file   Vitals:   02/18/21 0902  BP: 128/70  Pulse: 79  Resp: 16  SpO2: 97%   Body mass index is 33.67 kg/m.  Wt Readings from Last 3 Encounters:  02/18/21 215 lb 3.2 oz (97.6 kg)  02/18/21 215 lb (97.5 kg)  01/16/21 214 lb 6.4 oz (97.3 kg)   Physical Exam Vitals and nursing note reviewed.  Constitutional:      General: She is not in acute distress.    Appearance: She is well-developed.  HENT:     Head: Normocephalic and atraumatic.     Mouth/Throat:     Mouth: Mucous membranes are moist.     Pharynx: Oropharynx is clear.  Eyes:     Conjunctiva/sclera:     Right eye: Right conjunctiva is injected (Mild).  Cardiovascular:     Rate and Rhythm: Normal rate. Rhythm irregular.     Pulses:          Dorsalis pedis pulses are 2+ on the right side and 2+ on the left side.     Heart sounds: No murmur heard.    Comments: Trace pitting LE edema, bilateral. Pulmonary:     Effort: Pulmonary effort is normal. No respiratory distress.     Breath sounds: Normal breath sounds.  Abdominal:     Palpations: Abdomen is soft. There is no hepatomegaly or mass.     Tenderness: There is no abdominal tenderness.  Lymphadenopathy:     Cervical: No cervical adenopathy.  Skin:    General: Skin is warm.     Findings: No erythema or rash.  Neurological:     General: No focal deficit present.     Mental Status: She is alert and oriented to person, place, and time.     Cranial Nerves: No cranial nerve deficit.     Gait: Gait normal.  Psychiatric:     Comments: Well groomed, good eye contact.   ASSESSMENT AND PLAN:  Ms. Glenda [name] was here  today AWV and follow up.  Orders Placed This Encounter  Procedures   Flu Vaccine QUAD High Dose(Fluad)   Basic metabolic panel   Lipid panel   Hemoglobin A1c   Lab Results  Component Value Date   HGBA1C 6.4 02/18/2021   Lab Results  Component Value Date   CHOL 170 02/18/2021   HDL 39.20 02/18/2021   LDLCALC 112 (H) 02/18/2021   LDLDIRECT 170.5 06/06/2010   TRIG 96.0 02/18/2021   CHOLHDL 4 02/18/2021   Lab Results  Component Value Date   CREATININE 0.72 02/18/2021   BUN 19 02/18/2021   NA 138 02/18/2021   K 5.1 02/18/2021   CL 101 02/18/2021   CO2 32 02/18/2021   Medicare annual wellness visit, subsequent We discussed the importance of staying active, physically and mentally, as well as the benefits of a healthy/balance diet. Low impact exercise that involve stretching and strengthing are ideal. Vaccines up-to-date. We discussed preventive screening for the next 5-10 years, summery of recommendations given in AVS.   Goals      DIET - REDUCE FAT INTAKE     Low fat diet, avoid frying. Try to walk 15 min daily.         This is a list of the screening recommended for you and due dates:  Health Maintenance  Topic Date Due   COVID-19 Vaccine (4 - Booster for Pfizer series) 03/06/2021*   Zoster (Shingles) Vaccine (1 of  2) 05/23/2021*   Tetanus Vaccine  03/12/2021   Mammogram  02/12/2022   Colon Cancer Screening  03/01/2023   Flu Shot  Completed   DEXA scan (bone density measurement)  Completed   Hepatitis C Screening: USPSTF Recommendation to screen - Ages 54-79 yo.  Completed   HPV Vaccine  Aged Out  *Topic was postponed. The date shown is not the original due date.   Fall prevention. Advance directives and end of life discussed. She has POA and living will forms but has not notarized it.   Hyperglycemia A healthy lifestyle recommended for diabetes prevention. Further recommendation will be given according to A1c result.  Hyperlipidemia, unspecified  hyperlipidemia type Hx of CVA. For now recommend continuing nonpharmacologic treatment. I will make recommendations in regard to starting medication, dose according to lipid panel results.  New onset of headaches after age 73 ?  Migraine headache. Brain MRI recommended, for now she prefers to hold on imaging.  She is going to see her ophthalmologist first and will let me know. She was clearly instructed about warning signs.  Need for influenza vaccination -     Flu Vaccine QUAD High Dose(Fluad)  Weakness of both lower extremities Could be caused by cervical myelopathy. Pending EMG NCS. Fall precautions discussed.  Anxiety disorder, unspecified type Otherwise stable. Continue Paroxetine 40 mg daily and Alprazolam 0.25 mg daily prm.  Return in about 3 months (around 05/21/2021) for cpe and headache.  Jarad Barth G. [place], MD  [facility]. [place] office.

## 2021-02-18 ENCOUNTER — Ambulatory Visit (HOSPITAL_COMMUNITY)
Admission: RE | Admit: 2021-02-18 | Discharge: 2021-02-18 | Disposition: A | Discharge status: Home / Self Care | Payer: Medicare PPO | Source: Ambulatory Visit | Attending: Nurse Practitioner | Admitting: Nurse Practitioner

## 2021-02-18 ENCOUNTER — Ambulatory Visit (INDEPENDENT_AMBULATORY_CARE_PROVIDER_SITE_OTHER): Payer: Medicare PPO | Admitting: Family Medicine

## 2021-02-18 ENCOUNTER — Encounter (HOSPITAL_COMMUNITY): Payer: Self-pay | Admitting: Nurse Practitioner

## 2021-02-18 ENCOUNTER — Other Ambulatory Visit: Payer: Self-pay

## 2021-02-18 ENCOUNTER — Encounter: Payer: Self-pay | Admitting: Family Medicine

## 2021-02-18 VITALS — BP 164/76 | HR 57 | Ht 67.0 in | Wt 215.2 lb

## 2021-02-18 VITALS — BP 128/70 | HR 79 | Resp 16 | Ht 67.0 in | Wt 215.0 lb

## 2021-02-18 DIAGNOSIS — R21 Rash and other nonspecific skin eruption: Secondary | ICD-10-CM | POA: Diagnosis not present

## 2021-02-18 DIAGNOSIS — Z6832 Body mass index (BMI) 32.0-32.9, adult: Secondary | ICD-10-CM | POA: Diagnosis not present

## 2021-02-18 DIAGNOSIS — R519 Headache, unspecified: Secondary | ICD-10-CM

## 2021-02-18 DIAGNOSIS — Z880 Allergy status to penicillin: Secondary | ICD-10-CM | POA: Diagnosis not present

## 2021-02-18 DIAGNOSIS — Z79899 Other long term (current) drug therapy: Secondary | ICD-10-CM | POA: Insufficient documentation

## 2021-02-18 DIAGNOSIS — I4819 Other persistent atrial fibrillation: Secondary | ICD-10-CM

## 2021-02-18 DIAGNOSIS — Z0000 Encounter for general adult medical examination without abnormal findings: Secondary | ICD-10-CM | POA: Diagnosis not present

## 2021-02-18 DIAGNOSIS — F419 Anxiety disorder, unspecified: Secondary | ICD-10-CM

## 2021-02-18 DIAGNOSIS — Z87891 Personal history of nicotine dependence: Secondary | ICD-10-CM | POA: Insufficient documentation

## 2021-02-18 DIAGNOSIS — I4891 Unspecified atrial fibrillation: Secondary | ICD-10-CM | POA: Insufficient documentation

## 2021-02-18 DIAGNOSIS — R739 Hyperglycemia, unspecified: Secondary | ICD-10-CM

## 2021-02-18 DIAGNOSIS — Z23 Encounter for immunization: Secondary | ICD-10-CM | POA: Diagnosis not present

## 2021-02-18 DIAGNOSIS — D6869 Other thrombophilia: Secondary | ICD-10-CM | POA: Diagnosis not present

## 2021-02-18 DIAGNOSIS — E785 Hyperlipidemia, unspecified: Secondary | ICD-10-CM | POA: Diagnosis not present

## 2021-02-18 DIAGNOSIS — R29898 Other symptoms and signs involving the musculoskeletal system: Secondary | ICD-10-CM

## 2021-02-18 DIAGNOSIS — Z713 Dietary counseling and surveillance: Secondary | ICD-10-CM | POA: Insufficient documentation

## 2021-02-18 DIAGNOSIS — E669 Obesity, unspecified: Secondary | ICD-10-CM | POA: Insufficient documentation

## 2021-02-18 LAB — BASIC METABOLIC PANEL
BUN: 19 mg/dL (ref 6–23)
CO2: 32 mEq/L (ref 19–32)
Calcium: 9.9 mg/dL (ref 8.4–10.5)
Chloride: 101 mEq/L (ref 96–112)
Creatinine, Ser: 0.72 mg/dL (ref 0.40–1.20)
GFR: 84.87 mL/min (ref >60.00)
Glucose, Bld: 121 mg/dL — ABNORMAL HIGH (ref 70–99)
Potassium: 5.1 mEq/L (ref 3.5–5.1)
Sodium: 138 mEq/L (ref 135–145)

## 2021-02-18 LAB — HEMOGLOBIN A1C: Hgb A1c MFr Bld: 6.4 % (ref 4.6–6.5)

## 2021-02-18 LAB — LIPID PANEL
Cholesterol: 170 mg/dL (ref 0–200)
HDL: 39.2 mg/dL (ref >39.00)
LDL Cholesterol: 112 mg/dL — ABNORMAL HIGH (ref 0–99)
NonHDL: 130.87
Total CHOL/HDL Ratio: 4
Triglycerides: 96 mg/dL (ref 0.0–149.0)
VLDL: 19.2 mg/dL (ref 0.0–40.0)

## 2021-02-18 MED ORDER — PAROXETINE HCL 40 MG PO TABS: 40.0000 mg | ORAL_TABLET | Freq: Every day | ORAL | 2 refills | Status: DC

## 2021-02-18 NOTE — Patient Instructions (Addendum)
  Ms. Bealer , Thank you for taking time to come for your Medicare Wellness Visit. I appreciate your ongoing commitment to your health goals. Please review the following plan we discussed and let me know if I can assist you in the future.   These are the goals we discussed:  Goals      DIET - REDUCE FAT INTAKE     Low fat diet, avoid frying. Try to walk 15 min daily.         This is a list of the screening recommended for you and due dates:  Health Maintenance  Topic Date Due   Flu Shot  12/16/2020   COVID-19 Vaccine (4 - Booster for Pfizer series) 03/06/2021*   Zoster (Shingles) Vaccine (1 of 2) 05/23/2021*   Tetanus Vaccine  03/12/2021   Mammogram  02/12/2022   Colon Cancer Screening  03/01/2023   DEXA scan (bone density measurement)  Completed   Hepatitis C Screening: USPSTF Recommendation to screen - Ages 58-79 yo.  Completed   HPV Vaccine  Aged Out  *Topic was postponed. The date shown is not the original due date.   A few tips:  -As we age balance is not as good as it was, so there is a higher risks for falls. Please remove small rugs and furniture that is "in your way" and could increase the risk of falls. Stretching exercises may help with fall prevention: Yoga and Tai Chi are some examples. Low impact exercise is better, so you are not very achy the next day.  -Sun screen and avoidance of direct sun light recommended. Caution with dehydration, if working outdoors be sure to drink enough fluids.  - Some medications are not safe as we age, increases the risk of side effects and can potentially interact with other medication you are also taken;  including some of over the counter medications. Be sure to let me know when you start a new medication even if it is a dietary/vitamin supplement.   -Healthy diet low in red meet/animal fat and sugar + regular physical activity is recommended.     A few things to remember from today's visit:   Medicare annual wellness visit,  subsequent  Hyperglycemia - Plan: Basic metabolic panel, Hemoglobin A1c  Hyperlipidemia, unspecified hyperlipidemia type - Plan: Lipid panel  New onset of headaches after age 68 - Plan: CANCELED: MR Brain W Wo Contrast  If you need refills please call your pharmacy. Do not use My Chart to request refills or for acute issues that need immediate attention.   Please be sure medication list is accurate. If a new problem present, please set up appointment sooner than planned today.

## 2021-02-18 NOTE — Progress Notes (Signed)
Primary Care Physician: Martinique, Betty G, MD Referring Physician: [facility] f/u    Glenda [name] is a 70 y.o. female with a h/o new onset afib that is in the afib clinic for f/u hosptial stay 6/13. She had neck surgery at that time. Afib was noted on pre op EKG and also at time of echo in April that was done for pre op clearance, although pt was not notified. While  in the hospital for neck surgery, afib was present and she was consulted by cardiology. She was started on BB for rate control and with CHA2DS2VASc score of 6, she was started on eliquis 72 hours after surgery. She was not aware of her afib.   In the office today, she remains in afib rate controlled. She reports that she has quit smoking. She drinks 2 alcoholic drinks a week. Husband reports that she has had snoring and apnea in the past but he does not note apnea now although she still has some snoring. She does not exercise.   F/u afib clinic, 6/30, remains in a fib at 86 bpm. She will be able to have cardioversion after 7/8 to allow for adequate anticoagulation. She has not missed any anticoagulation since start of drug, 6/17. She is tolerating well.   F/u in afib clinic 12/04/20. Unfortunately, cardioversion did not convert pt despite multiple shocks prior with manual compression. She is here to discuss options. She has developed a rash of her neck, arms torso that was there to a small degree prior to CV but has gotten worse since. Benadryl has not helped with the itching. Received propofol with CV and  in the past  has not shown to be allergic.   F/u in afib clinic, 01/16/21. She is in rate controlled afib today. She feels she may be going in and out. She  feels the BB is making her fatigued and teary at times. Discussed switching to cardizem daily. She brings in BP readings for the last several weeks. Overall BP looks well controlled. She would like to see what the monitor shows before she firmly makes her mind to pursue antiarrythmic's.    F/u in afib clinic, 02/18/21. She remains in rate controlled afib. She is not symptomatic. Recent monitor did show 100% afib burden as pt thought she may be going in and out. She is pending another echo 10/20.   Today, she denies symptoms of palpitations, chest pain, shortness of breath, orthopnea, PND, lower extremity edema, dizziness, presyncope, syncope, or neurologic sequela. The patient is tolerating medications without difficulties and is otherwise without complaint today.   Past Medical History:  Diagnosis Date   Allergic rhinitis    Allergy    Anxiety    Arthritis    Cataract    Heart murmur    History of colonic polyps    History of PSVT (paroxysmal supraventricular tachycardia)    Hypertension    Impaired glucose tolerance    Obesity    PAT (paroxysmal atrial tachycardia) (HCC)    Pre-diabetes    hx, not currently being monitor for diabetes, diet controlled   Stroke ([place]) 08/2020   Scan revealed stroke in history   Tobacco user    Vertigo    started 3 weeks ago- after fluid trapped in ear   Past Surgical History:  Procedure Laterality Date   ankle sx-left fx     ANTERIOR CERVICAL DECOMP/DISCECTOMY FUSION N/A 10/28/2020   Procedure: Anterior Cervical Decompression Fusion - Cervical Four-Cervical Five - Cervical  Five-Cervical Six - Cervical Six- Cervical Seven;  Surgeon: Kary Kos, MD;  Location: [place];  Service: Neurosurgery;  Laterality: N/A;  Anterior Cervical Decompression Fusion - Cervical Four-Cervical Five - Cervical Five-Cervical Six - Cervical Six- Cervical Seven   CARDIOVERSION N/A 11/28/2020   Procedure: CARDIOVERSION;  Surgeon: Josue Hector, MD;  Location: [facility] ENDOSCOPY;  Service: Cardiovascular;  Laterality: N/A;   CATARACT EXTRACTION     both eyes   CHOLECYSTECTOMY N/A 01/11/2018   Procedure: LAPAROSCOPIC CHOLECYSTECTOMY WITH INTRAOPERATIVE CHOLANGIOGRAM ERAS PATHWAY;  Surgeon: Alphonsa Overall, MD;  Location: [place];  Service: General;  Laterality: N/A;    COLONOSCOPY  04/23/2005   DIAGNOSTIC LAPAROSCOPY     EYE SURGERY     foot sx     with the ankle surgery   POLYPECTOMY      Current Outpatient Medications  Medication Sig Dispense Refill   acetaminophen (TYLENOL) 500 MG tablet Take 1,000 mg by mouth 2 (two) times daily as needed for moderate pain.     ALPRAZolam (XANAX) 0.25 MG tablet Take 0.5-1 tablets (0.125-0.25 mg total) by mouth daily as needed for anxiety. 30 tablet 0   apixaban (ELIQUIS) 5 MG TABS tablet Take 1 tablet (5 mg total) by mouth 2 (two) times daily. First dose 11/01/20. 180 tablet 3   calamine lotion Apply 1 application topically as needed for itching.     diltiazem (CARDIZEM CD) 120 MG 24 hr capsule Take 1 capsule (120 mg total) by mouth daily. 30 capsule 11   diphenhydrAMINE (BENADRYL) 25 MG tablet Take 25 mg by mouth daily as needed for allergies.     hydrocortisone cream 0.5 % Apply 1 application topically 2 (two) times daily.     metoprolol succinate (TOPROL XL) 25 MG 24 hr tablet Take 1 tablet (25 mg total) by mouth at bedtime.     PARoxetine (PAXIL) 40 MG tablet Take 1 tablet (40 mg total) by mouth daily. 30 tablet 2   No current facility-administered medications for this encounter.    Allergies  Allergen Reactions   Amoxicillin Rash and Other (See Comments)    Has patient had a PCN reaction causing immediate rash, facial/tongue/throat swelling, SOB or lightheadedness with hypotension: No HAS PT DEVELOPED SEVERE RASH INVOLVING MUCUS MEMBRANES or SKIN NECROSIS: #  #  YES  #  #  Has patient had a PCN reaction that required hospitalization: No Has patient had a PCN reaction occurring within the last 10 years: No    Erythromycin Other (See Comments)    Upset stomach    Social History   Socioeconomic History   Marital status: Married    Spouse name: Not on file   Number of children: 2   Years of education: college   Highest education level: Not on file  Occupational History   Occupation: Retired  Tobacco  Use   Smoking status: Former    Packs/day: 0.50    Types: Cigarettes    Quit date: 10/17/2020    Years since quitting: 0.3   Smokeless tobacco: Never  Vaping Use   Vaping Use: Never used  Substance and Sexual Activity   Alcohol use: Yes    Alcohol/week: 2.0 standard drinks    Types: 2 Standard drinks or equivalent per week    Comment: weekends   Drug use: No   Sexual activity: Yes  Other Topics Concern   Not on file  Social History Narrative   Lives at home with her husband.   Right-handed.  One cup caffeine per day.   Social Determinants of Health   Financial Resource Strain: Not on file  Food Insecurity: Not on file  Transportation Needs: Not on file  Physical Activity: Not on file  Stress: Not on file  Social Connections: Not on file  Intimate Partner Violence: Not on file    Family History  Problem Relation Age of Onset   Healthy Mother    Dementia Father    Healthy Sister    Healthy Sister    Alzheimer's disease Other    Colon cancer Neg Hx    Esophageal cancer Neg Hx    Rectal cancer Neg Hx    Stomach cancer Neg Hx    Colon polyps Neg Hx     ROS- All systems are reviewed and negative except as per the HPI above  Physical Exam: Vitals:   02/18/21 1128  BP: (!) 164/76  Pulse: (!) 57  Weight: 97.6 kg  Height: 5\' 7"  (1.702 m)   Wt Readings from Last 3 Encounters:  02/18/21 97.6 kg  02/18/21 97.5 kg  01/16/21 97.3 kg    Labs: Lab Results  Component Value Date   NA 140 11/28/2020   K 4.4 11/28/2020   CL 105 11/28/2020   CO2 28 11/14/2020   GLUCOSE 134 (H) 11/28/2020   BUN 15 11/28/2020   CREATININE 0.60 11/28/2020   CALCIUM 9.3 11/14/2020   MG 1.9 10/28/2020   No results found for: INR Lab Results  Component Value Date   CHOL 204 (H) 11/24/2019   HDL 37 (L) 11/24/2019   LDLCALC 142 (H) 11/24/2019   TRIG 123 11/24/2019     GEN- The patient is well appearing, alert and oriented x 3 today.   Head- normocephalic, atraumatic Eyes-   Sclera clear, conjunctiva pink Ears- hearing intact Oropharynx- clear Neck- supple, no JVP Lymph- no cervical lymphadenopathy Lungs- Clear to ausculation bilaterally, normal work of breathing Heart- irregular rate and rhythm, no murmurs, rubs or gallops, PMI not laterally displaced GI- soft, NT, ND, + BS Extremities- no clubbing, cyanosis, or edema MS- no significant deformity or atrophy Skin- no rash or lesion Psych- euthymic mood, full affect Neuro- strength and sensation are intact  EKG-afib at 77 bpm, qrs int 82 ms, qtc 407 ms   Echo- 08/24/20-1. Left ventricular ejection fraction, by estimation, is 60 to 65%. The  left ventricle has normal function. The left ventricle has no regional  wall motion abnormalities. There is mild left ventricular hypertrophy.  Left ventricular diastolic parameters  are indeterminate.   2. Right ventricular systolic function is normal. The right ventricular  size is normal. Tricuspid regurgitation signal is inadequate for assessing  PA pressure.   3. The mitral valve is normal in structure. No evidence of mitral valve  regurgitation.   4. The aortic valve was not well visualized. Aortic valve regurgitation  is not visualized. No aortic stenosis is present.   5. The inferior vena cava is normal in size with greater than 50%  respiratory variability, suggesting right atrial pressure of 3 mmHg.   6. Rhythm is irregular and no clear A-wave seen on mitral inflow Doppler,  concerning for atrial fibrillation. Recommend evaluating for Afib with  cardiac monitor    Assessment and Plan:  1. New onset afib, first dx  spring of 2022 Rate controlled but felt that she is not tolerating BB with fatigue and being weepy at times  Stopped  metoprolol 25 mg bid and started Cardizem 120 mg  daily  We discussed options going forward. Living in rate controlled afib vrs AAD drug.  I discussed flecainide or tikosyn. I feel Multaq may not be effective as  she would not  even convert with several shocks. I will await the results of the pending echo 10/20 and if EF is still normal, will schedule for lexi myoview and if low risk, will start flecainide.   2. CHA2DS2VASc  score of 6 ( female, age,  CVA(2), carotid disease)  Continue eliquis 5 mg bid   3. BMI of 32.95  Weight loss and regular exercise encouraged Has stopped smoking  Avoid alcohol  Snoring but no apnea per husband   4. Rash   Resolved that was present after last CV  Will see  back in one month  Butch Penny C. Stasha Naraine, [facility] [address] [place], [place] [address] [phone]

## 2021-02-20 ENCOUNTER — Encounter: Payer: Self-pay | Admitting: Family Medicine

## 2021-02-21 ENCOUNTER — Other Ambulatory Visit: Payer: Self-pay | Admitting: Family Medicine

## 2021-02-21 MED ORDER — ROSUVASTATIN CALCIUM 10 MG PO TABS: 10.0000 mg | ORAL_TABLET | Freq: Every day | ORAL | 3 refills | Status: DC

## 2021-02-24 DIAGNOSIS — R202 Paresthesia of skin: Secondary | ICD-10-CM | POA: Diagnosis not present

## 2021-02-24 DIAGNOSIS — R200 Anesthesia of skin: Secondary | ICD-10-CM | POA: Diagnosis not present

## 2021-03-05 ENCOUNTER — Encounter (HOSPITAL_COMMUNITY): Payer: Self-pay

## 2021-03-06 ENCOUNTER — Other Ambulatory Visit: Payer: Self-pay

## 2021-03-06 ENCOUNTER — Ambulatory Visit (HOSPITAL_COMMUNITY)
Admission: RE | Admit: 2021-03-06 | Discharge: 2021-03-06 | Disposition: A | Discharge status: Home / Self Care | Payer: Medicare PPO | Source: Ambulatory Visit | Attending: Nurse Practitioner | Admitting: Nurse Practitioner

## 2021-03-06 DIAGNOSIS — E785 Hyperlipidemia, unspecified: Secondary | ICD-10-CM | POA: Insufficient documentation

## 2021-03-06 DIAGNOSIS — I10 Essential (primary) hypertension: Secondary | ICD-10-CM | POA: Diagnosis not present

## 2021-03-06 DIAGNOSIS — I4819 Other persistent atrial fibrillation: Secondary | ICD-10-CM | POA: Diagnosis not present

## 2021-03-06 LAB — ECHOCARDIOGRAM COMPLETE: S' Lateral: 2.5 cm

## 2021-03-06 NOTE — Progress Notes (Signed)
  Echocardiogram 2D Echocardiogram has been performed.  Johny Chess 03/06/2021, 12:01 PM

## 2021-03-10 ENCOUNTER — Other Ambulatory Visit (HOSPITAL_COMMUNITY): Payer: Self-pay | Admitting: *Deleted

## 2021-03-10 DIAGNOSIS — I4819 Other persistent atrial fibrillation: Secondary | ICD-10-CM

## 2021-03-11 ENCOUNTER — Other Ambulatory Visit (HOSPITAL_COMMUNITY): Payer: Self-pay | Admitting: Nurse Practitioner

## 2021-03-11 DIAGNOSIS — I4819 Other persistent atrial fibrillation: Secondary | ICD-10-CM

## 2021-03-11 DIAGNOSIS — G629 Polyneuropathy, unspecified: Secondary | ICD-10-CM | POA: Diagnosis not present

## 2021-03-11 DIAGNOSIS — I739 Peripheral vascular disease, unspecified: Secondary | ICD-10-CM | POA: Diagnosis not present

## 2021-03-17 ENCOUNTER — Telehealth (HOSPITAL_COMMUNITY): Payer: Self-pay

## 2021-03-17 NOTE — Telephone Encounter (Signed)
Spoke with the patient, detailed instructions given. She stated that she would be here for here test. Asked to call back with any questions. S.Savalas Monje Emtp

## 2021-03-18 ENCOUNTER — Ambulatory Visit (HOSPITAL_COMMUNITY): Payer: Medicare PPO | Attending: Internal Medicine

## 2021-03-18 ENCOUNTER — Other Ambulatory Visit: Payer: Self-pay

## 2021-03-18 ENCOUNTER — Ambulatory Visit (HOSPITAL_COMMUNITY): Payer: Medicare PPO | Admitting: Nurse Practitioner

## 2021-03-18 DIAGNOSIS — I4819 Other persistent atrial fibrillation: Secondary | ICD-10-CM

## 2021-03-18 LAB — MYOCARDIAL PERFUSION IMAGING
LV dias vol: 47 mL (ref 46–106)
LV sys vol: 13 mL
Nuc Stress EF: 72 %
Peak HR: 77 {beats}/min
Rest HR: 65 {beats}/min
Rest Nuclear Isotope Dose: 10.5 mCi
SDS: 0
SRS: 0
SSS: 0
ST Depression (mm): 0 mm
Stress Nuclear Isotope Dose: 30.3 mCi
TID: 0.84

## 2021-03-18 MED ORDER — TECHNETIUM TC 99M TETROFOSMIN IV KIT
30.3000 | PACK | Freq: Once | INTRAVENOUS | Status: AC | PRN
  Administered 2021-03-18: 30.3 via INTRAVENOUS
  Filled 2021-03-18: qty 31

## 2021-03-18 MED ORDER — REGADENOSON 0.4 MG/5ML IV SOLN
0.4000 mg | Freq: Once | INTRAVENOUS | Status: AC
  Administered 2021-03-18: 0.4 mg via INTRAVENOUS

## 2021-03-18 MED ORDER — TECHNETIUM TC 99M TETROFOSMIN IV KIT
10.5000 | PACK | Freq: Once | INTRAVENOUS | Status: AC | PRN
  Administered 2021-03-18: 10.5 via INTRAVENOUS
  Filled 2021-03-18: qty 11

## 2021-03-19 ENCOUNTER — Other Ambulatory Visit (HOSPITAL_COMMUNITY): Payer: Self-pay | Admitting: *Deleted

## 2021-03-19 MED ORDER — FLECAINIDE ACETATE 50 MG PO TABS: 50.0000 mg | ORAL_TABLET | Freq: Two times a day (BID) | ORAL | 3 refills | Status: DC

## 2021-03-25 ENCOUNTER — Ambulatory Visit (HOSPITAL_COMMUNITY)
Admission: RE | Admit: 2021-03-25 | Discharge: 2021-03-25 | Disposition: A | Discharge status: Home / Self Care | Payer: Medicare PPO | Source: Ambulatory Visit | Attending: Nurse Practitioner | Admitting: Nurse Practitioner

## 2021-03-25 ENCOUNTER — Other Ambulatory Visit: Payer: Self-pay

## 2021-03-25 VITALS — BP 160/76 | HR 70 | Ht 67.0 in | Wt 220.6 lb

## 2021-03-25 DIAGNOSIS — I4819 Other persistent atrial fibrillation: Secondary | ICD-10-CM | POA: Diagnosis not present

## 2021-03-25 DIAGNOSIS — Z7901 Long term (current) use of anticoagulants: Secondary | ICD-10-CM | POA: Insufficient documentation

## 2021-03-25 DIAGNOSIS — Z713 Dietary counseling and surveillance: Secondary | ICD-10-CM | POA: Insufficient documentation

## 2021-03-25 DIAGNOSIS — D6869 Other thrombophilia: Secondary | ICD-10-CM | POA: Diagnosis not present

## 2021-03-25 DIAGNOSIS — Z79899 Other long term (current) drug therapy: Secondary | ICD-10-CM | POA: Insufficient documentation

## 2021-03-25 DIAGNOSIS — R0683 Snoring: Secondary | ICD-10-CM | POA: Insufficient documentation

## 2021-03-25 DIAGNOSIS — Z6832 Body mass index (BMI) 32.0-32.9, adult: Secondary | ICD-10-CM | POA: Insufficient documentation

## 2021-03-25 DIAGNOSIS — E669 Obesity, unspecified: Secondary | ICD-10-CM | POA: Insufficient documentation

## 2021-03-25 DIAGNOSIS — Z87891 Personal history of nicotine dependence: Secondary | ICD-10-CM | POA: Diagnosis not present

## 2021-03-25 DIAGNOSIS — Z7182 Exercise counseling: Secondary | ICD-10-CM | POA: Insufficient documentation

## 2021-03-25 DIAGNOSIS — I4891 Unspecified atrial fibrillation: Secondary | ICD-10-CM | POA: Insufficient documentation

## 2021-03-25 MED ORDER — FLECAINIDE ACETATE 50 MG PO TABS: 100.0000 mg | ORAL_TABLET | Freq: Two times a day (BID) | ORAL | 3 refills | Status: DC

## 2021-03-25 NOTE — H&P (View-Only) (Signed)
Primary Care Physician: Martinique, Betty G, MD Referring Physician: [facility] f/u    Glenda [name] is a 70 y.o. female with a h/o new onset afib that is in the afib clinic for f/u hosptial stay 6/13. She had neck surgery at that time. Afib was noted on pre op EKG and also at time of echo in April that was done for pre op clearance, although pt was not notified. While  in the hospital for neck surgery, afib was present and she was consulted by cardiology. She was started on BB for rate control and with CHA2DS2VASc score of 6, she was started on eliquis 72 hours after surgery. She was not aware of her afib.   In the office today, she remains in afib rate controlled. She reports that she has quit smoking. She drinks 2 alcoholic drinks a week. Husband reports that she has had snoring and apnea in the past but he does not note apnea now although she still has some snoring. She does not exercise.   F/u afib clinic, 6/30, remains in a fib at 86 bpm. She will be able to have cardioversion after 7/8 to allow for adequate anticoagulation. She has not missed any anticoagulation since start of drug, 6/17. She is tolerating well.   F/u in afib clinic 12/04/20. Unfortunately, cardioversion did not convert pt despite multiple shocks prior with manual compression. She is here to discuss options. She has developed a rash of her neck, arms torso that was there to a small degree prior to CV but has gotten worse since. Benadryl has not helped with the itching. Received propofol with CV and  in the past  has not shown to be allergic.   F/u in afib clinic, 01/16/21. She is in rate controlled afib today. She feels she may be going in and out. She  feels the BB is making her fatigued and teary at times. Discussed switching to cardizem daily. She brings in BP readings for the last several weeks. Overall BP looks well controlled. She would like to see what the monitor shows before she firmly makes her mind to pursue antiarrythmic's.    F/u in afib clinic, 02/18/21. She remains in rate controlled afib. She is not symptomatic. Recent monitor did show 100% afib burden as pt thought she may be going in and out. She is pending another echo 10/20.    F/u in afib clinic, 03/25/21, after starting on flecainide with echo showing normal EF and with a low risk stress test. She is on 50 mg bid and will increase to 100 mg bid and I will see back in one week to discuss cardioversion. BP elevated here but BP at home controlled.   Today, she denies symptoms of palpitations, chest pain, shortness of breath, orthopnea, PND, lower extremity edema, dizziness, presyncope, syncope, or neurologic sequela. The patient is tolerating medications without difficulties and is otherwise without complaint today.   Past Medical History:  Diagnosis Date   Allergic rhinitis    Allergy    Anxiety    Arthritis    Cataract    Heart murmur    History of colonic polyps    History of PSVT (paroxysmal supraventricular tachycardia)    Hypertension    Impaired glucose tolerance    Obesity    PAT (paroxysmal atrial tachycardia) (HCC)    Pre-diabetes    hx, not currently being monitor for diabetes, diet controlled   Stroke [facility]) 08/2020   Scan revealed stroke in history  Tobacco user    Vertigo    started 3 weeks ago- after fluid trapped in ear   Past Surgical History:  Procedure Laterality Date   ankle sx-left fx     ANTERIOR CERVICAL DECOMP/DISCECTOMY FUSION N/A 10/28/2020   Procedure: Anterior Cervical Decompression Fusion - Cervical Four-Cervical Five - Cervical Five-Cervical Six - Cervical Six- Cervical Seven;  Surgeon: Kary Kos, MD;  Location: [place];  Service: Neurosurgery;  Laterality: N/A;  Anterior Cervical Decompression Fusion - Cervical Four-Cervical Five - Cervical Five-Cervical Six - Cervical Six- Cervical Seven   CARDIOVERSION N/A 11/28/2020   Procedure: CARDIOVERSION;  Surgeon: Josue Hector, MD;  Location: [facility] ENDOSCOPY;  Service:  Cardiovascular;  Laterality: N/A;   CATARACT EXTRACTION     both eyes   CHOLECYSTECTOMY N/A 01/11/2018   Procedure: LAPAROSCOPIC CHOLECYSTECTOMY WITH INTRAOPERATIVE CHOLANGIOGRAM ERAS PATHWAY;  Surgeon: Alphonsa Overall, MD;  Location: [place];  Service: General;  Laterality: N/A;   COLONOSCOPY  04/23/2005   DIAGNOSTIC LAPAROSCOPY     EYE SURGERY     foot sx     with the ankle surgery   POLYPECTOMY      Current Outpatient Medications  Medication Sig Dispense Refill   acetaminophen (TYLENOL) 500 MG tablet Take 1,000 mg by mouth 2 (two) times daily as needed for moderate pain.     ALPRAZolam (XANAX) 0.25 MG tablet TAKE 1 TABLET BY MOUTH DAILY AS NEEDED FOR ANXIETY 30 tablet 1   apixaban (ELIQUIS) 5 MG TABS tablet Take 1 tablet (5 mg total) by mouth 2 (two) times daily. First dose 11/01/20. 180 tablet 3   diltiazem (CARDIZEM CD) 120 MG 24 hr capsule Take 1 capsule (120 mg total) by mouth daily. 30 capsule 11   metoprolol succinate (TOPROL XL) 25 MG 24 hr tablet Take 1 tablet (25 mg total) by mouth at bedtime.     NON FORMULARY Multivitamin- Taking one tablet by mouth daily     PARoxetine (PAXIL) 40 MG tablet Take 1 tablet (40 mg total) by mouth daily. 90 tablet 2   rosuvastatin (CRESTOR) 10 MG tablet Take 1 tablet (10 mg total) by mouth daily. 30 tablet 3   flecainide (TAMBOCOR) 50 MG tablet Take 2 tablets (100 mg total) by mouth 2 (two) times daily. 60 tablet 3   No current facility-administered medications for this encounter.    Allergies  Allergen Reactions   Amoxicillin Rash and Other (See Comments)    Has patient had a PCN reaction causing immediate rash, facial/tongue/throat swelling, SOB or lightheadedness with hypotension: No HAS PT DEVELOPED SEVERE RASH INVOLVING MUCUS MEMBRANES or SKIN NECROSIS: #  #  YES  #  #  Has patient had a PCN reaction that required hospitalization: No Has patient had a PCN reaction occurring within the last 10 years: No    Erythromycin Other (See  Comments)    Upset stomach    Social History   Socioeconomic History   Marital status: Married    Spouse name: Not on file   Number of children: 2   Years of education: college   Highest education level: Not on file  Occupational History   Occupation: Retired  Tobacco Use   Smoking status: Former    Packs/day: 0.50    Types: Cigarettes    Quit date: 10/17/2020    Years since quitting: 0.4   Smokeless tobacco: Never  Vaping Use   Vaping Use: Never used  Substance and Sexual Activity   Alcohol use: Yes  Alcohol/week: 2.0 standard drinks    Types: 2 Standard drinks or equivalent per week    Comment: weekends   Drug use: No   Sexual activity: Yes  Other Topics Concern   Not on file  Social History Narrative   Lives at home with her husband.   Right-handed.   One cup caffeine per day.   Social Determinants of Health   Financial Resource Strain: Not on file  Food Insecurity: Not on file  Transportation Needs: Not on file  Physical Activity: Not on file  Stress: Not on file  Social Connections: Not on file  Intimate Partner Violence: Not on file    Family History  Problem Relation Age of Onset   Healthy Mother    Dementia Father    Healthy Sister    Healthy Sister    Alzheimer's disease Other    Colon cancer Neg Hx    Esophageal cancer Neg Hx    Rectal cancer Neg Hx    Stomach cancer Neg Hx    Colon polyps Neg Hx     ROS- All systems are reviewed and negative except as per the HPI above  Physical Exam: Vitals:   03/25/21 1355  BP: (!) 160/76  Pulse: 70  Weight: 100.1 kg  Height: 5\' 7"  (1.702 m)   Wt Readings from Last 3 Encounters:  03/25/21 100.1 kg  03/18/21 97.5 kg  02/18/21 97.6 kg    Labs: Lab Results  Component Value Date   NA 138 02/18/2021   K 5.1 02/18/2021   CL 101 02/18/2021   CO2 32 02/18/2021   GLUCOSE 121 (H) 02/18/2021   BUN 19 02/18/2021   CREATININE 0.72 02/18/2021   CALCIUM 9.9 02/18/2021   MG 1.9 10/28/2020   No  results found for: INR Lab Results  Component Value Date   CHOL 170 02/18/2021   HDL 39.20 02/18/2021   LDLCALC 112 (H) 02/18/2021   TRIG 96.0 02/18/2021     GEN- The patient is well appearing, alert and oriented x 3 today.   Head- normocephalic, atraumatic Eyes-  Sclera clear, conjunctiva pink Ears- hearing intact Oropharynx- clear Neck- supple, no JVP Lymph- no cervical lymphadenopathy Lungs- Clear to ausculation bilaterally, normal work of breathing Heart- irregular rate and rhythm, no murmurs, rubs or gallops, PMI not laterally displaced GI- soft, NT, ND, + BS Extremities- no clubbing, cyanosis, or edema MS- no significant deformity or atrophy Skin- no rash or lesion Psych- euthymic mood, full affect Neuro- strength and sensation are intact  EKG-afib at 70 bpm, qrs int 92 ms, qtc 419 ms   Echo- 03/06/21- 1. Left ventricular ejection fraction, by estimation, is 60 to 65%. The  left ventricle has normal function. The left ventricle has no regional  wall motion abnormalities. Left ventricular diastolic parameters are  indeterminate.   2. Right ventricular systolic function is normal. The right ventricular  size is normal. There is normal pulmonary artery systolic pressure.   3. The mitral valve is normal in structure. No evidence of mitral valve  regurgitation. No evidence of mitral stenosis.   4. The aortic valve was not well visualized. Aortic valve regurgitation  is not visualized.   5. The inferior vena cava is normal in size with greater than 50%  respiratory variability, suggesting right atrial pressure of 3 mmHg.   Lexi Myoview- 03/18/21-   The study is normal. The study is low risk.   No ST deviation was noted.   Left ventricular function is normal.  Nuclear stress EF: 72 %. The left ventricular ejection fraction is hyperdynamic (>65%). End diastolic cavity size is normal.   Prior study not available for comparison.   Findings: Negative for stress induced  arrhythmias. No evidence of ischemia or infarction.   Conclusions: Stress test is negative for ischemia or infarct  Assessment and Plan:  1. New onset afib, first dx  spring of 2022 Rate controlled  On flecainide 50 mg bid x one week  Now will increase to 100 mg bid and bring back in one week to be scheduled for cardioversion Continue Cardizem 120 mg daily   2. CHA2DS2VASc  score of 6 ( female, age,  CVA(2), carotid disease)  Continue eliquis 5 mg bid   3. BMI of 32.95  Weight loss and regular exercise encouraged Has stopped smoking  Avoid alcohol  Snoring but no apnea per husband    Will see  back in one week   Butch Penny C. Riki Gehring, [facility] [address] [place], [place] [address] [phone]

## 2021-03-25 NOTE — Patient Instructions (Signed)
Increase flecainide 100mg  twice a day

## 2021-03-25 NOTE — Progress Notes (Signed)
Primary Care Physician: Martinique, Betty G, MD Referring Physician: [facility] f/u    Glenda [name] is a 70 y.o. female with a h/o new onset afib that is in the afib clinic for f/u hosptial stay 6/13. She had neck surgery at that time. Afib was noted on pre op EKG and also at time of echo in April that was done for pre op clearance, although pt was not notified. While  in the hospital for neck surgery, afib was present and she was consulted by cardiology. She was started on BB for rate control and with CHA2DS2VASc score of 6, she was started on eliquis 72 hours after surgery. She was not aware of her afib.   In the office today, she remains in afib rate controlled. She reports that she has quit smoking. She drinks 2 alcoholic drinks a week. Husband reports that she has had snoring and apnea in the past but he does not note apnea now although she still has some snoring. She does not exercise.   F/u afib clinic, 6/30, remains in a fib at 86 bpm. She will be able to have cardioversion after 7/8 to allow for adequate anticoagulation. She has not missed any anticoagulation since start of drug, 6/17. She is tolerating well.   F/u in afib clinic 12/04/20. Unfortunately, cardioversion did not convert pt despite multiple shocks prior with manual compression. She is here to discuss options. She has developed a rash of her neck, arms torso that was there to a small degree prior to CV but has gotten worse since. Benadryl has not helped with the itching. Received propofol with CV and  in the past  has not shown to be allergic.   F/u in afib clinic, 01/16/21. She is in rate controlled afib today. She feels she may be going in and out. She  feels the BB is making her fatigued and teary at times. Discussed switching to cardizem daily. She brings in BP readings for the last several weeks. Overall BP looks well controlled. She would like to see what the monitor shows before she firmly makes her mind to pursue antiarrythmic's.    F/u in afib clinic, 02/18/21. She remains in rate controlled afib. She is not symptomatic. Recent monitor did show 100% afib burden as pt thought she may be going in and out. She is pending another echo 10/20.    F/u in afib clinic, 03/25/21, after starting on flecainide with echo showing normal EF and with a low risk stress test. She is on 50 mg bid and will increase to 100 mg bid and I will see back in one week to discuss cardioversion. BP elevated here but BP at home controlled.   Today, she denies symptoms of palpitations, chest pain, shortness of breath, orthopnea, PND, lower extremity edema, dizziness, presyncope, syncope, or neurologic sequela. The patient is tolerating medications without difficulties and is otherwise without complaint today.   Past Medical History:  Diagnosis Date   Allergic rhinitis    Allergy    Anxiety    Arthritis    Cataract    Heart murmur    History of colonic polyps    History of PSVT (paroxysmal supraventricular tachycardia)    Hypertension    Impaired glucose tolerance    Obesity    PAT (paroxysmal atrial tachycardia) (HCC)    Pre-diabetes    hx, not currently being monitor for diabetes, diet controlled   Stroke [facility]) 08/2020   Scan revealed stroke in history  Tobacco user    Vertigo    started 3 weeks ago- after fluid trapped in ear   Past Surgical History:  Procedure Laterality Date   ankle sx-left fx     ANTERIOR CERVICAL DECOMP/DISCECTOMY FUSION N/A 10/28/2020   Procedure: Anterior Cervical Decompression Fusion - Cervical Four-Cervical Five - Cervical Five-Cervical Six - Cervical Six- Cervical Seven;  Surgeon: Kary Kos, MD;  Location: [place];  Service: Neurosurgery;  Laterality: N/A;  Anterior Cervical Decompression Fusion - Cervical Four-Cervical Five - Cervical Five-Cervical Six - Cervical Six- Cervical Seven   CARDIOVERSION N/A 11/28/2020   Procedure: CARDIOVERSION;  Surgeon: Josue Hector, MD;  Location: [facility] ENDOSCOPY;  Service:  Cardiovascular;  Laterality: N/A;   CATARACT EXTRACTION     both eyes   CHOLECYSTECTOMY N/A 01/11/2018   Procedure: LAPAROSCOPIC CHOLECYSTECTOMY WITH INTRAOPERATIVE CHOLANGIOGRAM ERAS PATHWAY;  Surgeon: Alphonsa Overall, MD;  Location: [place];  Service: General;  Laterality: N/A;   COLONOSCOPY  04/23/2005   DIAGNOSTIC LAPAROSCOPY     EYE SURGERY     foot sx     with the ankle surgery   POLYPECTOMY      Current Outpatient Medications  Medication Sig Dispense Refill   acetaminophen (TYLENOL) 500 MG tablet Take 1,000 mg by mouth 2 (two) times daily as needed for moderate pain.     ALPRAZolam (XANAX) 0.25 MG tablet TAKE 1 TABLET BY MOUTH DAILY AS NEEDED FOR ANXIETY 30 tablet 1   apixaban (ELIQUIS) 5 MG TABS tablet Take 1 tablet (5 mg total) by mouth 2 (two) times daily. First dose 11/01/20. 180 tablet 3   diltiazem (CARDIZEM CD) 120 MG 24 hr capsule Take 1 capsule (120 mg total) by mouth daily. 30 capsule 11   metoprolol succinate (TOPROL XL) 25 MG 24 hr tablet Take 1 tablet (25 mg total) by mouth at bedtime.     NON FORMULARY Multivitamin- Taking one tablet by mouth daily     PARoxetine (PAXIL) 40 MG tablet Take 1 tablet (40 mg total) by mouth daily. 90 tablet 2   rosuvastatin (CRESTOR) 10 MG tablet Take 1 tablet (10 mg total) by mouth daily. 30 tablet 3   flecainide (TAMBOCOR) 50 MG tablet Take 2 tablets (100 mg total) by mouth 2 (two) times daily. 60 tablet 3   No current facility-administered medications for this encounter.    Allergies  Allergen Reactions   Amoxicillin Rash and Other (See Comments)    Has patient had a PCN reaction causing immediate rash, facial/tongue/throat swelling, SOB or lightheadedness with hypotension: No HAS PT DEVELOPED SEVERE RASH INVOLVING MUCUS MEMBRANES or SKIN NECROSIS: #  #  YES  #  #  Has patient had a PCN reaction that required hospitalization: No Has patient had a PCN reaction occurring within the last 10 years: No    Erythromycin Other (See  Comments)    Upset stomach    Social History   Socioeconomic History   Marital status: Married    Spouse name: Not on file   Number of children: 2   Years of education: college   Highest education level: Not on file  Occupational History   Occupation: Retired  Tobacco Use   Smoking status: Former    Packs/day: 0.50    Types: Cigarettes    Quit date: 10/17/2020    Years since quitting: 0.4   Smokeless tobacco: Never  Vaping Use   Vaping Use: Never used  Substance and Sexual Activity   Alcohol use: Yes  Alcohol/week: 2.0 standard drinks    Types: 2 Standard drinks or equivalent per week    Comment: weekends   Drug use: No   Sexual activity: Yes  Other Topics Concern   Not on file  Social History Narrative   Lives at home with her husband.   Right-handed.   One cup caffeine per day.   Social Determinants of Health   Financial Resource Strain: Not on file  Food Insecurity: Not on file  Transportation Needs: Not on file  Physical Activity: Not on file  Stress: Not on file  Social Connections: Not on file  Intimate Partner Violence: Not on file    Family History  Problem Relation Age of Onset   Healthy Mother    Dementia Father    Healthy Sister    Healthy Sister    Alzheimer's disease Other    Colon cancer Neg Hx    Esophageal cancer Neg Hx    Rectal cancer Neg Hx    Stomach cancer Neg Hx    Colon polyps Neg Hx     ROS- All systems are reviewed and negative except as per the HPI above  Physical Exam: Vitals:   03/25/21 1355  BP: (!) 160/76  Pulse: 70  Weight: 100.1 kg  Height: 5\' 7"  (1.702 m)   Wt Readings from Last 3 Encounters:  03/25/21 100.1 kg  03/18/21 97.5 kg  02/18/21 97.6 kg    Labs: Lab Results  Component Value Date   NA 138 02/18/2021   K 5.1 02/18/2021   CL 101 02/18/2021   CO2 32 02/18/2021   GLUCOSE 121 (H) 02/18/2021   BUN 19 02/18/2021   CREATININE 0.72 02/18/2021   CALCIUM 9.9 02/18/2021   MG 1.9 10/28/2020   No  results found for: INR Lab Results  Component Value Date   CHOL 170 02/18/2021   HDL 39.20 02/18/2021   LDLCALC 112 (H) 02/18/2021   TRIG 96.0 02/18/2021     GEN- The patient is well appearing, alert and oriented x 3 today.   Head- normocephalic, atraumatic Eyes-  Sclera clear, conjunctiva pink Ears- hearing intact Oropharynx- clear Neck- supple, no JVP Lymph- no cervical lymphadenopathy Lungs- Clear to ausculation bilaterally, normal work of breathing Heart- irregular rate and rhythm, no murmurs, rubs or gallops, PMI not laterally displaced GI- soft, NT, ND, + BS Extremities- no clubbing, cyanosis, or edema MS- no significant deformity or atrophy Skin- no rash or lesion Psych- euthymic mood, full affect Neuro- strength and sensation are intact  EKG-afib at 70 bpm, qrs int 92 ms, qtc 419 ms   Echo- 03/06/21- 1. Left ventricular ejection fraction, by estimation, is 60 to 65%. The  left ventricle has normal function. The left ventricle has no regional  wall motion abnormalities. Left ventricular diastolic parameters are  indeterminate.   2. Right ventricular systolic function is normal. The right ventricular  size is normal. There is normal pulmonary artery systolic pressure.   3. The mitral valve is normal in structure. No evidence of mitral valve  regurgitation. No evidence of mitral stenosis.   4. The aortic valve was not well visualized. Aortic valve regurgitation  is not visualized.   5. The inferior vena cava is normal in size with greater than 50%  respiratory variability, suggesting right atrial pressure of 3 mmHg.   Lexi Myoview- 03/18/21-   The study is normal. The study is low risk.   No ST deviation was noted.   Left ventricular function is normal.  Nuclear stress EF: 72 %. The left ventricular ejection fraction is hyperdynamic (>65%). End diastolic cavity size is normal.   Prior study not available for comparison.   Findings: Negative for stress induced  arrhythmias. No evidence of ischemia or infarction.   Conclusions: Stress test is negative for ischemia or infarct  Assessment and Plan:  1. New onset afib, first dx  spring of 2022 Rate controlled  On flecainide 50 mg bid x one week  Now will increase to 100 mg bid and bring back in one week to be scheduled for cardioversion Continue Cardizem 120 mg daily   2. CHA2DS2VASc  score of 6 ( female, age,  CVA(2), carotid disease)  Continue eliquis 5 mg bid   3. BMI of 32.95  Weight loss and regular exercise encouraged Has stopped smoking  Avoid alcohol  Snoring but no apnea per husband    Will see  back in one week   Butch Penny C. Jonuel Butterfield, [facility] [address] [place], [place] [address] [phone]

## 2021-04-01 ENCOUNTER — Encounter (HOSPITAL_COMMUNITY): Payer: Medicare PPO | Admitting: Nurse Practitioner

## 2021-04-01 ENCOUNTER — Ambulatory Visit (HOSPITAL_COMMUNITY)
Admission: RE | Admit: 2021-04-01 | Discharge: 2021-04-01 | Disposition: A | Discharge status: Home / Self Care | Payer: Medicare PPO | Source: Ambulatory Visit | Attending: Nurse Practitioner | Admitting: Nurse Practitioner

## 2021-04-01 ENCOUNTER — Other Ambulatory Visit: Payer: Self-pay

## 2021-04-01 DIAGNOSIS — I4819 Other persistent atrial fibrillation: Secondary | ICD-10-CM | POA: Diagnosis not present

## 2021-04-01 LAB — CBC
HCT: 41.5 % (ref 36.0–46.0)
Hemoglobin: 13.3 g/dL (ref 12.0–15.0)
MCH: 31.5 pg (ref 26.0–34.0)
MCHC: 32 g/dL (ref 30.0–36.0)
MCV: 98.3 fL (ref 80.0–100.0)
Platelets: 309 10*3/uL (ref 150–400)
RBC: 4.22 MIL/uL (ref 3.87–5.11)
RDW: 12.6 % (ref 11.5–15.5)
WBC: 9.8 10*3/uL (ref 4.0–10.5)
nRBC: 0 % (ref 0.0–0.2)

## 2021-04-01 LAB — BASIC METABOLIC PANEL
Anion gap: 9 (ref 5–15)
BUN: 13 mg/dL (ref 8–23)
CO2: 28 mmol/L (ref 22–32)
Calcium: 9.2 mg/dL (ref 8.9–10.3)
Chloride: 99 mmol/L (ref 98–111)
Creatinine, Ser: 0.74 mg/dL (ref 0.44–1.00)
GFR, Estimated: >60 mL/min (ref >60)
Glucose, Bld: 85 mg/dL (ref 70–99)
Potassium: 4.1 mmol/L (ref 3.5–5.1)
Sodium: 136 mmol/L (ref 135–145)

## 2021-04-01 MED ORDER — FLECAINIDE ACETATE 100 MG PO TABS
100.0000 mg | ORAL_TABLET | Freq: Two times a day (BID) | ORAL | 3 refills | Status: DC
Start: 2021-04-01 — End: 2021-06-30

## 2021-04-01 NOTE — H&P (View-Only) (Signed)
Patient returns for ECG after increasing flecainide. ECG shows afib HR 68, QRS 102, QTc 423. Will arrange for DCCV. Check bmet/cbc. F/u in the AF clinic 1-2 weeks post DCCV.

## 2021-04-01 NOTE — Progress Notes (Signed)
Patient returns for ECG after increasing flecainide. ECG shows afib HR 68, QRS 102, QTc 423. Will arrange for DCCV. Check bmet/cbc. F/u in the AF clinic 1-2 weeks post DCCV.

## 2021-04-01 NOTE — Patient Instructions (Signed)
Cardioversion scheduled for Tuesday, November 29th  - Arrive at the [facility] and go to admitting at 830AM  - Do not eat or drink anything after midnight the night prior to your procedure.  - Take all your morning medication (except diabetic medications) with a sip of water prior to arrival.  - You will not be able to drive home after your procedure.  - Do NOT miss any doses of your blood thinner - if you should miss a dose please notify our office immediately.  - If you feel as if you go back into normal rhythm prior to scheduled cardioversion, please notify our office immediately. If your procedure is canceled in the cardioversion suite you will be charged a cancellation fee. Patients will be asked to: to mask in public and hand hygiene (no longer quarantine) in the 3 days prior to surgery, to report if any COVID-19-like illness or household contacts to COVID-19 to determine need for testing

## 2021-04-07 ENCOUNTER — Other Ambulatory Visit: Payer: Self-pay | Admitting: Family Medicine

## 2021-04-07 DIAGNOSIS — Z1231 Encounter for screening mammogram for malignant neoplasm of breast: Secondary | ICD-10-CM

## 2021-04-15 ENCOUNTER — Other Ambulatory Visit: Payer: Self-pay

## 2021-04-15 ENCOUNTER — Encounter (HOSPITAL_COMMUNITY)
Admission: RE | Disposition: A | Discharge status: Home / Self Care | Payer: Self-pay | Source: Home / Self Care | Attending: Internal Medicine

## 2021-04-15 ENCOUNTER — Ambulatory Visit (HOSPITAL_COMMUNITY)
Admission: RE | Admit: 2021-04-15 | Discharge: 2021-04-15 | Disposition: A | Discharge status: Home / Self Care | Payer: Medicare PPO | Attending: Internal Medicine | Admitting: Internal Medicine

## 2021-04-15 ENCOUNTER — Ambulatory Visit (HOSPITAL_COMMUNITY): Payer: Medicare PPO | Admitting: Anesthesiology

## 2021-04-15 DIAGNOSIS — I10 Essential (primary) hypertension: Secondary | ICD-10-CM | POA: Diagnosis not present

## 2021-04-15 DIAGNOSIS — Z87891 Personal history of nicotine dependence: Secondary | ICD-10-CM | POA: Insufficient documentation

## 2021-04-15 DIAGNOSIS — Z8673 Personal history of transient ischemic attack (TIA), and cerebral infarction without residual deficits: Secondary | ICD-10-CM | POA: Insufficient documentation

## 2021-04-15 DIAGNOSIS — E785 Hyperlipidemia, unspecified: Secondary | ICD-10-CM | POA: Diagnosis not present

## 2021-04-15 DIAGNOSIS — I4819 Other persistent atrial fibrillation: Secondary | ICD-10-CM | POA: Diagnosis not present

## 2021-04-15 DIAGNOSIS — I4891 Unspecified atrial fibrillation: Secondary | ICD-10-CM | POA: Diagnosis not present

## 2021-04-15 DIAGNOSIS — F419 Anxiety disorder, unspecified: Secondary | ICD-10-CM | POA: Insufficient documentation

## 2021-04-15 DIAGNOSIS — M1990 Unspecified osteoarthritis, unspecified site: Secondary | ICD-10-CM | POA: Diagnosis not present

## 2021-04-15 DIAGNOSIS — J309 Allergic rhinitis, unspecified: Secondary | ICD-10-CM | POA: Diagnosis not present

## 2021-04-15 HISTORY — PX: CARDIOVERSION: SHX1299

## 2021-04-15 SURGERY — CARDIOVERSION
Anesthesia: General

## 2021-04-15 MED ORDER — SODIUM CHLORIDE 0.9 % IV SOLN: INTRAVENOUS | Status: DC

## 2021-04-15 MED ORDER — PROPOFOL 10 MG/ML IV BOLUS
INTRAVENOUS | Status: DC | PRN
  Administered 2021-04-15: 50 mg via INTRAVENOUS

## 2021-04-15 MED ORDER — LIDOCAINE 2% (20 MG/ML) 5 ML SYRINGE
INTRAMUSCULAR | Status: DC | PRN
  Administered 2021-04-15: 40 mg via INTRAVENOUS

## 2021-04-15 MED ORDER — METOPROLOL SUCCINATE ER 25 MG PO TB24: 12.5000 mg | ORAL_TABLET | Freq: Every day | ORAL | Status: DC

## 2021-04-15 NOTE — Discharge Instructions (Signed)

## 2021-04-15 NOTE — CV Procedure (Addendum)
   CARDIOVERSION NOTE  Procedure: Electrical Cardioversion Indications:  Atrial Fibrillation  Procedure Details:  Consent: Risks of procedure as well as the alternatives and risks of each were explained to the (patient/caregiver).  Consent for procedure obtained.  Time Out: Verified patient identification, verified procedure, site/side was marked, verified correct patient position, special equipment/implants available, medications/allergies/relevent history reviewed, required imaging and test results available.  Performed  Patient placed on cardiac monitor, pulse oximetry, supplemental oxygen as necessary.  Sedation given:  propofol per anesthesia Pacer pads placed anterior and posterior chest.  Cardioverted 1 time(s).  Cardioverted at 150J biphasic.  Impression: Findings: Post procedure EKG shows:  Sinus brady with PACs Complications: None Patient did tolerate procedure well.  Plan: Successful DCCV with a single 150J biphasic shock Sinus brady with PAC's and brief pauses noted - will decrease PM toprol XL to 12.5 mg daily.  Time Spent Directly with the Patient:  30 minutes   [name], MD, [facility], [place] Director of the [facility] Lipid [facility] &  Cardiovascular Risk Reduction Clinic Diplomate of the American Board of Clinical Lipidology Attending Cardiologist  Direct Dial: [phone]  Fax: [phone]  Website:  [email or web address] 04/15/2021, 9:39 AM

## 2021-04-15 NOTE — Anesthesia Preprocedure Evaluation (Signed)
Anesthesia Evaluation  Patient identified by MRN, date of birth, ID band Patient awake    Reviewed: Allergy & Precautions, H&P , NPO status , Patient's Chart, lab work & pertinent test results, reviewed documented beta blocker date and time   Airway Mallampati: II  TM Distance: >3 FB Neck ROM: Full    Dental no notable dental hx. (+) Teeth Intact, Dental Advisory Given   Pulmonary neg pulmonary ROS, former smoker,    Pulmonary exam normal breath sounds clear to auscultation       Cardiovascular hypertension, Pt. on medications and Pt. on home beta blockers negative cardio ROS  + dysrhythmias Atrial Fibrillation  Rhythm:Regular Rate:Normal     Neuro/Psych Anxiety CVA negative neurological ROS  negative psych ROS   GI/Hepatic negative GI ROS, Neg liver ROS,   Endo/Other  negative endocrine ROS  Renal/GU negative Renal ROS  negative genitourinary   Musculoskeletal  (+) Arthritis , Osteoarthritis,    Abdominal (+) + obese,   Peds  Hematology negative hematology ROS (+)   Anesthesia Other Findings   Reproductive/Obstetrics negative OB ROS                             Anesthesia Physical  Anesthesia Plan  ASA: 3  Anesthesia Plan: General   Post-op Pain Management:    Induction: Intravenous  PONV Risk Score and Plan: 3 and Propofol infusion and Treatment may vary due to age or medical condition  Airway Management Planned: Simple Face Mask and Natural Airway  Additional Equipment: None  Intra-op Plan:   Post-operative Plan:   Informed Consent: I have reviewed the patients History and Physical, chart, labs and discussed the procedure including the risks, benefits and alternatives for the proposed anesthesia with the patient or authorized representative who has indicated his/her understanding and acceptance.     Dental advisory given  Plan Discussed with: CRNA  Anesthesia Plan  Comments:         Anesthesia Quick Evaluation

## 2021-04-15 NOTE — Anesthesia Postprocedure Evaluation (Signed)
Anesthesia Post Note  Patient: Glenda [name]  Procedure(s) Performed: CARDIOVERSION     Patient location during evaluation: Endoscopy Anesthesia Type: General Level of consciousness: awake Pain management: pain level controlled Vital Signs Assessment: post-procedure vital signs reviewed and stable Respiratory status: spontaneous breathing Cardiovascular status: stable Postop Assessment: no apparent nausea or vomiting Anesthetic complications: no   No notable events documented.  Last Vitals:  Vitals:   04/15/21 0951 04/15/21 1001  BP: 111/63 (!) 111/39  Pulse: (!) 49 (!) 55  Resp: 17 17  Temp:    SpO2: 97% 97%    Last Pain:  Vitals:   04/15/21 0951  TempSrc:   PainSc: 0-No pain                 Huston Foley

## 2021-04-15 NOTE — Interval H&P Note (Signed)
History and Physical Interval Note:  04/15/2021 9:31 AM  Glenda [name]  has presented today for surgery, with the diagnosis of AFIB.  The various methods of treatment have been discussed with the patient and family. After consideration of risks, benefits and other options for treatment, the patient has consented to  Procedure(s): CARDIOVERSION (N/A) as a surgical intervention.  The patient's history has been reviewed, patient examined, no change in status, stable for surgery.  I have reviewed the patient's chart and labs.  Questions were answered to the patient's satisfaction.     [name]

## 2021-04-15 NOTE — Transfer of Care (Signed)
Immediate Anesthesia Transfer of Care Note  Patient: Glenda [name]  Procedure(s) Performed: CARDIOVERSION  Patient Location: PACU and Endoscopy Unit  Anesthesia Type:General  Level of Consciousness: drowsy  Airway & Oxygen Therapy: Patient Spontanous Breathing  Post-op Assessment: Report given to RN and Post -op Vital signs reviewed and stable  Post vital signs: Reviewed and stable  Last Vitals:  Vitals Value Taken Time  BP    Temp    Pulse    Resp    SpO2      Last Pain:  Vitals:   04/15/21 0854  TempSrc: Oral  PainSc: 0-No pain         Complications: No notable events documented.

## 2021-04-17 ENCOUNTER — Encounter (HOSPITAL_COMMUNITY): Payer: Self-pay | Admitting: Internal Medicine

## 2021-04-18 NOTE — Interval H&P Note (Signed)
History and Physical Interval Note:  Glenda [name]  has presented today for surgery, with the diagnosis of AFIB.  The various methods of treatment have been discussed with the patient and family. After consideration of risks, benefits and other options for treatment, the patient has consented to  Procedure(s): CARDIOVERSION (N/A) as a surgical intervention.  The patient's history has been reviewed, patient examined, no change in status, stable for surgery.  I have reviewed the patient's chart and labs.  Questions were answered to the patient's satisfaction.     [name]

## 2021-04-22 ENCOUNTER — Other Ambulatory Visit: Payer: Self-pay

## 2021-04-22 ENCOUNTER — Ambulatory Visit (HOSPITAL_COMMUNITY)
Admission: RE | Admit: 2021-04-22 | Discharge: 2021-04-22 | Disposition: A | Discharge status: Home / Self Care | Payer: Medicare PPO | Source: Ambulatory Visit | Attending: Nurse Practitioner | Admitting: Nurse Practitioner

## 2021-04-22 ENCOUNTER — Encounter (HOSPITAL_COMMUNITY): Payer: Self-pay | Admitting: Nurse Practitioner

## 2021-04-22 VITALS — BP 148/62 | HR 69 | Ht 67.0 in | Wt 223.4 lb

## 2021-04-22 DIAGNOSIS — I4891 Unspecified atrial fibrillation: Secondary | ICD-10-CM | POA: Diagnosis not present

## 2021-04-22 DIAGNOSIS — Z79899 Other long term (current) drug therapy: Secondary | ICD-10-CM | POA: Insufficient documentation

## 2021-04-22 DIAGNOSIS — R0683 Snoring: Secondary | ICD-10-CM | POA: Insufficient documentation

## 2021-04-22 DIAGNOSIS — D6869 Other thrombophilia: Secondary | ICD-10-CM | POA: Diagnosis not present

## 2021-04-22 DIAGNOSIS — Z87891 Personal history of nicotine dependence: Secondary | ICD-10-CM | POA: Diagnosis not present

## 2021-04-22 DIAGNOSIS — Z7901 Long term (current) use of anticoagulants: Secondary | ICD-10-CM | POA: Diagnosis not present

## 2021-04-22 DIAGNOSIS — I4819 Other persistent atrial fibrillation: Secondary | ICD-10-CM | POA: Diagnosis not present

## 2021-04-22 MED ORDER — METOPROLOL SUCCINATE ER 25 MG PO TB24: 25.0000 mg | ORAL_TABLET | Freq: Every day | ORAL | Status: DC

## 2021-04-22 NOTE — Progress Notes (Addendum)
Primary Care Physician: Martinique, Betty G, MD Referring Physician: [facility] f/u    Glenda [name] is a 70 y.o. female with a h/o new onset afib that is in the afib clinic for f/u hosptial stay 6/13. She had neck surgery at that time. Afib was noted on pre op EKG and also at time of echo in April that was done for pre op clearance, although pt was not notified. While  in the hospital for neck surgery, afib was present and she was consulted by cardiology. She was started on BB for rate control and with CHA2DS2VASc score of 6, she was started on eliquis 72 hours after surgery. She was not aware of her afib.   In the office today, she remains in afib rate controlled. She reports that she has quit smoking. She drinks 2 alcoholic drinks a week. Husband reports that she has had snoring and apnea in the past but he does not note apnea now although she still has some snoring. She does not exercise.   F/u afib clinic, 6/30, remains in a fib at 86 bpm. She will be able to have cardioversion after 7/8 to allow for adequate anticoagulation. She has not missed any anticoagulation since start of drug, 6/17. She is tolerating well.   F/u in afib clinic 12/04/20. Unfortunately, cardioversion did not convert pt despite multiple shocks prior with manual compression. She is here to discuss options. She has developed a rash of her neck, arms torso that was there to a small degree prior to CV but has gotten worse since. Benadryl has not helped with the itching. Received propofol with CV and  in the past  has not shown to be allergic.   F/u in afib clinic, 01/16/21. She is in rate controlled afib today. She feels she may be going in and out. She  feels the BB is making her fatigued and teary at times. Discussed switching to cardizem daily. She brings in BP readings for the last several weeks. Overall BP looks well controlled. She would like to see what the monitor shows before she firmly makes her mind to pursue antiarrythmic's.    F/u in afib clinic, 02/18/21. She remains in rate controlled afib. She is not symptomatic. Recent monitor did show 100% afib burden as pt thought she may be going in and out. She is pending another echo 10/20.    F/u in afib clinic, 03/25/21, after starting on flecainide with echo showing normal EF and with a low risk stress test. She is on 50 mg bid and will increase to 100 mg bid and I will see back in one week to discuss cardioversion. BP elevated here but BP at home controlled.   F/u in afib clinic 04/22/21. She had a successful cardioversion 11/29. Dr. Debara Pickett reduced PM metoprolol as she had bradycardia on return to SR. EKG today shows afib rate controlled. She does not feel she was in SR long enough to appreciate if she felt better. I discussed seeing EP to discuss ablation and she is in agreement. Last echo showed normal left atrial size.   Today, she denies symptoms of palpitations, chest pain, shortness of breath, orthopnea, PND, lower extremity edema, dizziness, presyncope, syncope, or neurologic sequela. The patient is tolerating medications without difficulties and is otherwise without complaint today.   Past Medical History:  Diagnosis Date   Allergic rhinitis    Allergy    Anxiety    Arthritis    Cataract    Heart murmur  History of colonic polyps    History of PSVT (paroxysmal supraventricular tachycardia)    Hypertension    Impaired glucose tolerance    Obesity    PAT (paroxysmal atrial tachycardia) (HCC)    Pre-diabetes    hx, not currently being monitor for diabetes, diet controlled   Stroke ([place]) 08/2020   Scan revealed stroke in history   Tobacco user    Vertigo    started 3 weeks ago- after fluid trapped in ear   Past Surgical History:  Procedure Laterality Date   ankle sx-left fx     ANTERIOR CERVICAL DECOMP/DISCECTOMY FUSION N/A 10/28/2020   Procedure: Anterior Cervical Decompression Fusion - Cervical Four-Cervical Five - Cervical Five-Cervical Six - Cervical  Six- Cervical Seven;  Surgeon: Kary Kos, MD;  Location: [facility];  Service: Neurosurgery;  Laterality: N/A;  Anterior Cervical Decompression Fusion - Cervical Four-Cervical Five - Cervical Five-Cervical Six - Cervical Six- Cervical Seven   CARDIOVERSION N/A 11/28/2020   Procedure: CARDIOVERSION;  Surgeon: Josue Hector, MD;  Location: [facility] ENDOSCOPY;  Service: Cardiovascular;  Laterality: N/A;   CARDIOVERSION N/A 04/15/2021   Procedure: CARDIOVERSION;  Surgeon: [name], MD;  Location: [place];  Service: Cardiovascular;  Laterality: N/A;   CATARACT EXTRACTION     both eyes   CHOLECYSTECTOMY N/A 01/11/2018   Procedure: LAPAROSCOPIC CHOLECYSTECTOMY WITH INTRAOPERATIVE CHOLANGIOGRAM ERAS PATHWAY;  Surgeon: Alphonsa Overall, MD;  Location: [place];  Service: General;  Laterality: N/A;   COLONOSCOPY  04/23/2005   DIAGNOSTIC LAPAROSCOPY     EYE SURGERY     foot sx     with the ankle surgery   POLYPECTOMY      Current Outpatient Medications  Medication Sig Dispense Refill   acetaminophen (TYLENOL) 500 MG tablet Take 1,000 mg by mouth 2 (two) times daily as needed for moderate pain.     ALPRAZolam (XANAX) 0.25 MG tablet TAKE 1 TABLET BY MOUTH DAILY AS NEEDED FOR ANXIETY 30 tablet 1   apixaban (ELIQUIS) 5 MG TABS tablet Take 1 tablet (5 mg total) by mouth 2 (two) times daily. First dose 11/01/20. 180 tablet 3   diltiazem (CARDIZEM CD) 120 MG 24 hr capsule Take 1 capsule (120 mg total) by mouth daily. 30 capsule 11   flecainide (TAMBOCOR) 100 MG tablet Take 1 tablet (100 mg total) by mouth 2 (two) times daily. 60 tablet 3   metoprolol succinate (TOPROL XL) 25 MG 24 hr tablet Take 0.5 tablets (12.5 mg total) by mouth at bedtime.     Multiple Vitamins-Minerals (MULTIVITAMIN WITH MINERALS) tablet Take 1 tablet by mouth daily.     PARoxetine (PAXIL) 40 MG tablet Take 1 tablet (40 mg total) by mouth daily. 90 tablet 2   Polyvinyl Alcohol-Povidone PF (REFRESH) 1.4-0.6 % SOLN Place 1 drop into the  right eye daily as needed (Dry eye).     rosuvastatin (CRESTOR) 10 MG tablet Take 1 tablet (10 mg total) by mouth daily. 30 tablet 3   No current facility-administered medications for this encounter.    Allergies  Allergen Reactions   Amoxicillin Rash and Other (See Comments)    Has patient had a PCN reaction causing immediate rash, facial/tongue/throat swelling, SOB or lightheadedness with hypotension: No HAS PT DEVELOPED SEVERE RASH INVOLVING MUCUS MEMBRANES or SKIN NECROSIS: #  #  YES  #  #  Has patient had a PCN reaction that required hospitalization: No Has patient had a PCN reaction occurring within the last 10 years: No  Erythromycin Other (See Comments)    Upset stomach    Social History   Socioeconomic History   Marital status: Married    Spouse name: Not on file   Number of children: 2   Years of education: college   Highest education level: Not on file  Occupational History   Occupation: Retired  Tobacco Use   Smoking status: Former    Packs/day: 0.50    Types: Cigarettes    Quit date: 10/17/2020    Years since quitting: 0.5   Smokeless tobacco: Never  Vaping Use   Vaping Use: Never used  Substance and Sexual Activity   Alcohol use: Yes    Alcohol/week: 2.0 standard drinks    Types: 2 Standard drinks or equivalent per week    Comment: weekends   Drug use: No   Sexual activity: Yes  Other Topics Concern   Not on file  Social History Narrative   Lives at home with her husband.   Right-handed.   One cup caffeine per day.   Social Determinants of Health   Financial Resource Strain: Not on file  Food Insecurity: Not on file  Transportation Needs: Not on file  Physical Activity: Not on file  Stress: Not on file  Social Connections: Not on file  Intimate Partner Violence: Not on file    Family History  Problem Relation Age of Onset   Healthy Mother    Dementia Father    Healthy Sister    Healthy Sister    Alzheimer's disease Other    Colon  cancer Neg Hx    Esophageal cancer Neg Hx    Rectal cancer Neg Hx    Stomach cancer Neg Hx    Colon polyps Neg Hx     ROS- All systems are reviewed and negative except as per the HPI above  Physical Exam: There were no vitals filed for this visit.  Wt Readings from Last 3 Encounters:  04/15/21 98.9 kg  03/25/21 100.1 kg  03/18/21 97.5 kg    Labs: Lab Results  Component Value Date   NA 136 04/01/2021   K 4.1 04/01/2021   CL 99 04/01/2021   CO2 28 04/01/2021   GLUCOSE 85 04/01/2021   BUN 13 04/01/2021   CREATININE 0.74 04/01/2021   CALCIUM 9.2 04/01/2021   MG 1.9 10/28/2020   No results found for: INR Lab Results  Component Value Date   CHOL 170 02/18/2021   HDL 39.20 02/18/2021   LDLCALC 112 (H) 02/18/2021   TRIG 96.0 02/18/2021     GEN- The patient is well appearing, alert and oriented x 3 today.   Head- normocephalic, atraumatic Eyes-  Sclera clear, conjunctiva pink Ears- hearing intact Oropharynx- clear Neck- supple, no JVP Lymph- no cervical lymphadenopathy Lungs- Clear to ausculation bilaterally, normal work of breathing Heart- irregular rate and rhythm, no murmurs, rubs or gallops, PMI not laterally displaced GI- soft, NT, ND, + BS Extremities- no clubbing, cyanosis, or edema MS- no significant deformity or atrophy Skin- no rash or lesion Psych- euthymic mood, full affect Neuro- strength and sensation are intact  EKG-afib at 70 bpm, qrs int 92 ms, qtc 419 ms   Echo- 03/06/21- 1. Left ventricular ejection fraction, by estimation, is 60 to 65%. The  left ventricle has normal function. The left ventricle has no regional  wall motion abnormalities. Left ventricular diastolic parameters are  indeterminate.   2. Right ventricular systolic function is normal. The right ventricular  size is normal. There  is normal pulmonary artery systolic pressure.   3. The mitral valve is normal in structure. No evidence of mitral valve  regurgitation. No evidence of  mitral stenosis.   4. The aortic valve was not well visualized. Aortic valve regurgitation  is not visualized.   5. The inferior vena cava is normal in size with greater than 50%  respiratory variability, suggesting right atrial pressure of 3 mmHg.   Lexi Myoview- 03/18/21-   The study is normal. The study is low risk.   No ST deviation was noted.   Left ventricular function is normal. Nuclear stress EF: 72 %. The left ventricular ejection fraction is hyperdynamic (>65%). End diastolic cavity size is normal.   Prior study not available for comparison.   Findings: Negative for stress induced arrhythmias. No evidence of ischemia or infarction.   Conclusions: Stress test is negative for ischemia or infarct  Assessment and Plan:  1. New onset afib, first dx  spring of 2022 Rate controlled  Started on flecainide 100 mg bid after low risk stress test.  Successful cardioversion but ERAF Will refer to EP to discuss ablation Will leave on flecainide so if she does go thru ablation, it may help post op to keep in SR  Continue Cardizem 120 mg daily and toprol 25 mg daily   2. CHA2DS2VASc  score of 6 ( female, age,  CVA(2), carotid disease)  Continue eliquis 5 mg bid   3. BMI of 32.95  Weight loss and regular exercise encouraged Has stopped smoking  Avoid alcohol  Snoring and daytime somnolence, will refer for sleep study    To EP   Glenda [name], [facility] [address] [place], [place] [address] [phone]

## 2021-04-23 ENCOUNTER — Telehealth: Payer: Self-pay | Admitting: *Deleted

## 2021-04-23 NOTE — Telephone Encounter (Signed)
Received a fax from [facility] approving sleep study. Auth # [id number]. Valid dates 05/05/21 to 06/04/21. Called and left message for the patient to check her mychart for appointment details.

## 2021-04-23 NOTE — Telephone Encounter (Signed)
-----   Message from Juluis Mire, RN sent at 04/22/2021  2:50 PM EST ----- Regarding: sleep study Pt for sleep study for snoring, afib, daytime somnolence per donna carroll Thanks [place]

## 2021-04-23 NOTE — Telephone Encounter (Signed)
Prior Authorization for split night sleep study sent to [facility]) via web portal. Tracking Number [id number].

## 2021-05-08 ENCOUNTER — Ambulatory Visit
Admission: RE | Admit: 2021-05-08 | Discharge: 2021-05-08 | Disposition: A | Discharge status: Home / Self Care | Payer: Medicare PPO | Source: Ambulatory Visit | Attending: Family Medicine | Admitting: Family Medicine

## 2021-05-08 DIAGNOSIS — Z1231 Encounter for screening mammogram for malignant neoplasm of breast: Secondary | ICD-10-CM

## 2021-05-19 ENCOUNTER — Other Ambulatory Visit: Payer: Self-pay | Admitting: Family Medicine

## 2021-05-27 ENCOUNTER — Ambulatory Visit (HOSPITAL_BASED_OUTPATIENT_CLINIC_OR_DEPARTMENT_OTHER): Payer: Medicare PPO | Attending: Nurse Practitioner | Admitting: Cardiovascular Disease

## 2021-05-27 ENCOUNTER — Other Ambulatory Visit: Payer: Self-pay

## 2021-05-27 DIAGNOSIS — R400 Somnolence: Secondary | ICD-10-CM | POA: Insufficient documentation

## 2021-05-27 DIAGNOSIS — R0683 Snoring: Secondary | ICD-10-CM | POA: Diagnosis not present

## 2021-05-27 DIAGNOSIS — G4733 Obstructive sleep apnea (adult) (pediatric): Secondary | ICD-10-CM

## 2021-05-27 DIAGNOSIS — I4819 Other persistent atrial fibrillation: Secondary | ICD-10-CM

## 2021-05-29 ENCOUNTER — Encounter: Payer: Self-pay | Admitting: Cardiology

## 2021-05-29 DIAGNOSIS — Z961 Presence of intraocular lens: Secondary | ICD-10-CM | POA: Diagnosis not present

## 2021-05-29 DIAGNOSIS — H5213 Myopia, bilateral: Secondary | ICD-10-CM | POA: Diagnosis not present

## 2021-05-29 DIAGNOSIS — H524 Presbyopia: Secondary | ICD-10-CM | POA: Diagnosis not present

## 2021-06-03 ENCOUNTER — Other Ambulatory Visit: Payer: Self-pay

## 2021-06-03 ENCOUNTER — Encounter: Payer: Self-pay | Admitting: Cardiology

## 2021-06-03 ENCOUNTER — Ambulatory Visit: Payer: Medicare PPO | Admitting: Cardiology

## 2021-06-03 VITALS — BP 132/78 | HR 72 | Ht 68.0 in | Wt 226.4 lb

## 2021-06-03 DIAGNOSIS — Z01812 Encounter for preprocedural laboratory examination: Secondary | ICD-10-CM

## 2021-06-03 DIAGNOSIS — I4819 Other persistent atrial fibrillation: Secondary | ICD-10-CM | POA: Diagnosis not present

## 2021-06-03 DIAGNOSIS — D6869 Other thrombophilia: Secondary | ICD-10-CM | POA: Diagnosis not present

## 2021-06-03 DIAGNOSIS — Z01818 Encounter for other preprocedural examination: Secondary | ICD-10-CM

## 2021-06-03 NOTE — Patient Instructions (Signed)
Medication Instructions:  Your physician recommends that you continue on your current medications as directed. Please refer to the Current Medication list given to you today.  *If you need a refill on your cardiac medications before your next appointment, please call your pharmacy*   Lab Work: Pre procedure labs 08/22/2021:  BMP & CBC  If you have labs (blood work) drawn today and your tests are completely normal, you will receive your results only by: [place] (if you have MyChart) OR A paper copy in the mail If you have any lab test that is abnormal or we need to change your treatment, we will call you to review the results.   Testing/Procedures: Your physician has requested that you have cardiac CT within 7 days PRIOR to your ablation. Cardiac computed tomography (CT) is a painless test that uses an x-ray machine to take clear, detailed pictures of your heart.  Please follow instruction below located under "other instructions". You will get a call from our office to schedule the date for this test.  Your physician has recommended that you have an ablation. Catheter ablation is a medical procedure used to treat some cardiac arrhythmias (irregular heartbeats). During catheter ablation, a long, thin, flexible tube is put into a blood vessel in your groin (upper thigh), or neck. This tube is called an ablation catheter. It is then guided to your heart through the blood vessel. Radio frequency waves destroy small areas of heart tissue where abnormal heartbeats may cause an arrhythmia to start. Please follow instruction below located under "other instructions".   Follow-Up: At [facility], you and your health needs are our priority.  As part of our continuing mission to provide you with exceptional heart care, we have created designated Provider Care Teams.  These Care Teams include your primary Cardiologist (physician) and Advanced Practice Providers (APPs -  Physician Assistants and  Nurse Practitioners) who all work together to provide you with the care you need, when you need it.  We recommend signing up for the patient portal called "MyChart".  Sign up information is provided on this After Visit Summary.  MyChart is used to connect with patients for Virtual Visits (Telemedicine).  Patients are able to view lab/test results, encounter notes, upcoming appointments, etc.  Non-urgent messages can be sent to your provider as well.   To learn more about what you can do with MyChart, go to [email or web address].    Your next appointment:   1 month(s) after your ablation  The format for your next appointment:   In Person  Provider:   AFib clinic   Thank you for choosing [facility]!!   Trinidad Curet, RN [phone]    Other Instructions   CT INSTRUCTIONS Your cardiac CT will be scheduled at:  [facility] [address] [place], [place] [address] [phone]  Please arrive at the [facility] main entrance of [facility] 30 minutes prior to test start time. Proceed to the [facility] Radiology Department (first floor) to check-in and test prep.   Please follow these instructions carefully (unless otherwise directed):  On the Night Before the Test: Be sure to Drink plenty of water. Do not consume any caffeinated/decaffeinated beverages or chocolate 12 hours prior to your test. Do not take any antihistamines 12 hours prior to your test.  On the Day of the Test: Drink plenty of water until 1 hour prior to the test. Do not eat any food 4 hours prior to the test.  You may take your regular medications prior to the test.  Take metoprolol (Lopressor) two hours prior to test. HOLD Furosemide/Hydrochlorothiazide morning of the test. FEMALES- please wear underwire-free bra if available       After the Test: Drink plenty of water. After receiving IV contrast, you may experience a mild flushed feeling. This is normal. On occasion,  you may experience a mild rash up to 24 hours after the test. This is not dangerous. If this occurs, you can take Benadryl 25 mg and increase your fluid intake. If you experience trouble breathing, this can be serious. If it is severe call 911 IMMEDIATELY. If it is mild, please call our office. If you take any of these medications: Glipizide/Metformin, Avandament, Glucavance, please do not take 48 hours after completing test unless otherwise instructed.   Once we have confirmed authorization from your insurance company, we will call you to set up a date and time for your test. Based on how quickly your insurance processes prior authorizations requests, please allow up to 4 weeks to be contacted for scheduling your Cardiac CT appointment. Be advised that routine Cardiac CT appointments could be scheduled as many as 8 weeks after your provider has ordered it.  For non-scheduling related questions, please contact the cardiac imaging nurse navigator should you have any questions/concerns: Marchia Bond, Cardiac Imaging Nurse Navigator Gordy Clement, Cardiac Imaging Nurse Navigator [facility] Direct Office Dial: [phone]   For scheduling needs, including cancellations and rescheduling, please call [place], [phone].      Electrophysiology/Ablation Procedure Instructions   You are scheduled for a(n) \ ablation on 09/05/2021 with Dr. Allegra Lai.   1.   Pre procedure testing-             A.  LAB WORK --- On 08/22/2021 at the [facility] office (see address at the top of this letter) for your pre procedure blood work.  You do NOT need to be fasting. You can stop by the [employer] office anytime between 7:30 am  - 4:30 pm   On the day of your procedure 09/05/2021 you will go to [facility] hospital ([address]) at 5:30 am.  Dennis Bast will go to the main entrance A [employer]) and enter where the [employer] are.  Your driver will drop you off and you will  head down the hallway to ADMITTING.  You may have one support person come in to the hospital with you.  They will be asked to wait in the waiting room. It is OK to have someone drop you off and come back when you are ready to be discharged.   3.   Do not eat or drink after midnight prior to your procedure.   4.   On the morning of your procedure do NOT take any medication. Do not miss any doses of your blood thinner prior to the morning of your procedure or your procedure will need to be rescheduled.   5.  Plan for an overnight stay but you may be discharged after your procedure, if you use your phone frequently bring your phone charger. If you are discharged after your procedure you will need someone to drive you home and be with you for 24 hours after your procedure.   6. You will follow up with the AFIB clinic 4 weeks after your procedure.  You will follow up with Dr. Curt Bears  3 months after your procedure.  These appointments will  be made for you.   7. FYI: For your safety, and to allow [place] to monitor your vital signs accurately during the surgery/procedure we request that if you have artificial nails, gel coating, SNS etc. Please have those removed prior to your surgery/procedure. Not having the nail coverings /polish removed may result in cancellation or delay of your surgery/procedure.  * If you have ANY questions please call the office [phone] and ask for Arleth Mccullar RN or send me a MyChart message   * Occasionally, EP Studies and ablations can become lengthy.  Please make your family aware of this before your procedure starts.  Average time ranges from 2-8 hours for EP studies/ablations.  Your physician will call your family after the procedure with the results.                                    Cardiac Ablation Cardiac ablation is a procedure to destroy (ablate) some heart tissue that is sending bad signals. These bad signals cause problems in heart rhythm. The heart has many areas  that make these signals. If there are problems in these areas, they can make the heart beat in a way that is not normal. Destroying some tissues can help make the heart rhythm normal. Tell your doctor about: Any allergies you have. All medicines you are taking. These include vitamins, herbs, eye drops, creams, and over-the-counter medicines. Any problems you or family members have had with medicines that make you fall asleep (anesthetics). Any blood disorders you have. Any surgeries you have had. Any medical conditions you have, such as kidney failure. Whether you are pregnant or may be pregnant. What are the risks? This is a safe procedure. But problems may occur, including: Infection. Bruising and bleeding. Bleeding into the chest. Stroke or blood clots. Damage to nearby areas of your body. Allergies to medicines or dyes. The need for a pacemaker if the normal system is damaged. Failure of the procedure to treat the problem. What happens before the procedure? Medicines Ask your doctor about: Changing or stopping your normal medicines. This is important. Taking aspirin and ibuprofen. Do not take these medicines unless your doctor tells you to take them. Taking other medicines, vitamins, herbs, and supplements. General instructions Follow instructions from your doctor about what you cannot eat or drink. Plan to have someone take you home from the hospital or clinic. If you will be going home right after the procedure, plan to have someone with you for 24 hours. Ask your doctor what steps will be taken to prevent infection. What happens during the procedure?  An IV tube will be put into one of your veins. You will be given a medicine to help you relax. The skin on your neck or groin will be numbed. A cut (incision) will be made in your neck or groin. A needle will be put through your cut and into a large vein. A tube (catheter) will be put into the needle. The tube will be moved to  your heart. Dye may be put through the tube. This helps your doctor see your heart. Small devices (electrodes) on the tube will send out signals. A type of energy will be used to destroy some heart tissue. The tube will be taken out. Pressure will be held on your cut. This helps stop bleeding. A bandage will be put over your cut. The exact procedure may vary among doctors  and hospitals. What happens after the procedure? You will be watched until you leave the hospital or clinic. This includes checking your heart rate, breathing rate, oxygen, and blood pressure. Your cut will be watched for bleeding. You will need to lie still for a few hours. Do not drive for 24 hours or as long as your doctor tells you. Summary Cardiac ablation is a procedure to destroy some heart tissue. This is done to treat heart rhythm problems. Tell your doctor about any medical conditions you may have. Tell him or her about all medicines you are taking to treat them. This is a safe procedure. But problems may occur. These include infection, bruising, bleeding, and damage to nearby areas of your body. Follow what your doctor tells you about food and drink. You may also be told to change or stop some of your medicines. After the procedure, do not drive for 24 hours or as long as your doctor tells you. This information is not intended to replace advice given to you by your health care provider. Make sure you discuss any questions you have with your health care provider. Document Revised: 04/06/2019 Document Reviewed: 04/06/2019 Elsevier Patient Education  2022 [employer].

## 2021-06-03 NOTE — Progress Notes (Signed)
Electrophysiology Office Note   Date:  06/03/2021   ID:  Glenda [name], DOB 08/05/1950, MRN [id number]  PCP:  [place], Betty G, MD  Cardiologist:   Primary Electrophysiologist:  Ersilia Brawley Meredith Leeds, MD    Chief Complaint: AF   History of Present Illness: Glenda [name] is a 71 y.o. female who is being seen today for the evaluation of AF at the request of Glenda Needs, NP. Presenting today for electrophysiology evaluation.  She has a history significant for hypertension, prediabetes, prior stroke, and persistent atrial fibrillation.  Atrial fibrillation was discovered during hospital visit in 2013.  She had neck surgery at the time.  She was noted to be in atrial fibrillation on her preoperative ECG.  She has had multiple cardioversions.  She was also loaded on flecainide and cardioverted with early return of atrial fibrillation.  Today, she denies symptoms of palpitations, chest pain, orthopnea, PND, lower extremity edema, claudication, dizziness, presyncope, syncope, bleeding, or neurologic sequela. The patient is tolerating medications without difficulties.  Atrial fibrillation symptoms are fatigue and shortness of breath.  She is able to do all of her daily activities, but she does feel quite tired most of the day.  She did have a cardioversion and was back in rhythm, though she had significant palpitations immediately after her cardioversion.  She does have a history of atrial tachycardia.   Past Medical History:  Diagnosis Date   Allergic rhinitis    Allergy    Anxiety    Arthritis    Cataract    Heart murmur    History of colonic polyps    History of PSVT (paroxysmal supraventricular tachycardia)    Hypertension    Impaired glucose tolerance    Obesity    PAT (paroxysmal atrial tachycardia) (HCC)    Pre-diabetes    hx, not currently being monitor for diabetes, diet controlled   Stroke ([place]) 08/2020   Scan revealed stroke in history   Tobacco user    Vertigo     started 3 weeks ago- after fluid trapped in ear   Past Surgical History:  Procedure Laterality Date   ankle sx-left fx     ANTERIOR CERVICAL DECOMP/DISCECTOMY FUSION N/A 10/28/2020   Procedure: Anterior Cervical Decompression Fusion - Cervical Four-Cervical Five - Cervical Five-Cervical Six - Cervical Six- Cervical Seven;  Surgeon: Kary Kos, MD;  Location: [place] [facility];  Service: Neurosurgery;  Laterality: N/A;  Anterior Cervical Decompression Fusion - Cervical Four-Cervical Five - Cervical Five-Cervical Six - Cervical Six- Cervical Seven   CARDIOVERSION N/A 11/28/2020   Procedure: CARDIOVERSION;  Surgeon: Josue Hector, MD;  Location: [facility];  Service: Cardiovascular;  Laterality: N/A;   CARDIOVERSION N/A 04/15/2021   Procedure: CARDIOVERSION;  Surgeon: [name], MD;  Location: [facility];  Service: Cardiovascular;  Laterality: N/A;   CATARACT EXTRACTION     both eyes   CHOLECYSTECTOMY N/A 01/11/2018   Procedure: LAPAROSCOPIC CHOLECYSTECTOMY WITH INTRAOPERATIVE CHOLANGIOGRAM ERAS PATHWAY;  Surgeon: Alphonsa Overall, MD;  Location: [place];  Service: General;  Laterality: N/A;   COLONOSCOPY  04/23/2005   DIAGNOSTIC LAPAROSCOPY     EYE SURGERY     foot sx     with the ankle surgery   POLYPECTOMY       Current Outpatient Medications  Medication Sig Dispense Refill   acetaminophen (TYLENOL) 500 MG tablet Take 1,000 mg by mouth 2 (two) times daily as needed for moderate pain.     ALPRAZolam (XANAX) 0.25 MG tablet  TAKE 1 TABLET BY MOUTH DAILY AS NEEDED FOR ANXIETY 30 tablet 1   apixaban (ELIQUIS) 5 MG TABS tablet Take 1 tablet (5 mg total) by mouth 2 (two) times daily. First dose 11/01/20. 180 tablet 3   diltiazem (CARDIZEM CD) 120 MG 24 hr capsule Take 1 capsule (120 mg total) by mouth daily. 30 capsule 11   flecainide (TAMBOCOR) 100 MG tablet Take 1 tablet (100 mg total) by mouth 2 (two) times daily. 60 tablet 3   metoprolol succinate (TOPROL XL) 25 MG 24 hr tablet Take 1 tablet (25  mg total) by mouth at bedtime.     Multiple Vitamins-Minerals (MULTIVITAMIN WITH MINERALS) tablet Take 1 tablet by mouth daily.     PARoxetine (PAXIL) 40 MG tablet Take 1 tablet (40 mg total) by mouth daily. 90 tablet 2   Polyvinyl Alcohol-Povidone PF (REFRESH) 1.4-0.6 % SOLN Place 1 drop into the right eye daily as needed (Dry eye).     rosuvastatin (CRESTOR) 10 MG tablet TAKE 1 TABLET BY MOUTH EVERY DAY 90 tablet 1   No current facility-administered medications for this visit.    Allergies:   Amoxicillin and Erythromycin   Social History:  The patient  reports that she quit smoking about 7 months ago. Her smoking use included cigarettes. She smoked an average of .5 packs per day. She has never used smokeless tobacco. She reports current alcohol use of about 2.0 standard drinks per week. She reports that she does not use drugs.   Family History:  The patient's family history includes Alzheimer's disease in an other family member; Dementia in her father; Healthy in her mother, sister, and sister.    ROS:  Please see the history of present illness.   Otherwise, review of systems is positive for none.   All other systems are reviewed and negative.    PHYSICAL EXAM: VS:  BP 132/78    Pulse 72    Ht 5\' 8"  (1.727 m)    Wt 226 lb 6.4 oz (102.7 kg)    SpO2 94%    BMI 34.42 kg/m  , BMI Body mass index is 34.42 kg/m. GEN: Well nourished, well developed, in no acute distress  HEENT: normal  Neck: no JVD, carotid bruits, or masses Cardiac: irregular; no murmurs, rubs, or gallops,no edema  Respiratory:  clear to auscultation bilaterally, normal work of breathing GI: soft, nontender, nondistended, + BS MS: no deformity or atrophy  Skin: warm and dry Neuro:  Strength and sensation are intact Psych: euthymic mood, full affect  EKG:  EKG is ordered today. Personal review of the ekg ordered shows atrial fibrillation, rate 72  Recent Labs: 06/16/2020: ALT 30 10/28/2020: Magnesium 1.9; TSH  0.607 04/01/2021: BUN 13; Creatinine, Ser 0.74; Hemoglobin 13.3; Platelets 309; Potassium 4.1; Sodium 136    Lipid Panel     Component Value Date/Time   CHOL 170 02/18/2021 1013   TRIG 96.0 02/18/2021 1013   HDL 39.20 02/18/2021 1013   CHOLHDL 4 02/18/2021 1013   VLDL 19.2 02/18/2021 1013   LDLCALC 112 (H) 02/18/2021 1013   LDLCALC 142 (H) 11/24/2019 1052   LDLDIRECT 170.5 06/06/2010 0811     Wt Readings from Last 3 Encounters:  06/03/21 226 lb 6.4 oz (102.7 kg)  05/27/21 220 lb (99.8 kg)  04/22/21 223 lb 6.4 oz (101.3 kg)      Other studies Reviewed: Additional studies/ records that were reviewed today include: TTE 03/06/21  Review of the above records today demonstrates:  1. Left ventricular ejection fraction, by estimation, is 60 to 65%. The  left ventricle has normal function. The left ventricle has no regional  wall motion abnormalities. Left ventricular diastolic parameters are  indeterminate.   2. Right ventricular systolic function is normal. The right ventricular  size is normal. There is normal pulmonary artery systolic pressure.   3. The mitral valve is normal in structure. No evidence of mitral valve  regurgitation. No evidence of mitral stenosis.   4. The aortic valve was not well visualized. Aortic valve regurgitation  is not visualized.   5. The inferior vena cava is normal in size with greater than 50%  respiratory variability, suggesting right atrial pressure of 3 mmHg.   Myoview 03/18/2021   The study is normal. The study is low risk.   No ST deviation was noted.   Left ventricular function is normal. Nuclear stress EF: 72 %. The left ventricular ejection fraction is hyperdynamic (>65%). End diastolic cavity size is normal.   Prior study not available for comparison.  Cardiac monitor 02/16/2021 personally reviewed Persistent atrial fibrillation (Burden 100%) Ventricular rates ranging from 34-173 bpm (avg of 72 bpm). No prolonged pauses  ASSESSMENT  AND PLAN:  1.  Persistent atrial fibrillation: Currently on flecainide 100 mg twice daily, Eliquis 5 mg twice daily.  CHA2DS2-VASc of 6.  She is unfortunately gone back into atrial fibrillation.  She would prefer a rhythm control strategy.  She would like to avoid further medication options.  Due to that, we Purva Vessell plan for ablation.  She does have a longstanding history of atrial tachycardia.  Antwione Picotte attempt to induce at the time of ablation.  Risk, benefits, and alternatives to EP study and radiofrequency ablation for afib were also discussed in detail today. These risks include but are not limited to stroke, bleeding, vascular damage, tamponade, perforation, damage to the esophagus, lungs, and other structures, pulmonary vein stenosis, worsening renal function, and death. The patient understands these risk and wishes to proceed.  We Kainan Patty therefore proceed with catheter ablation at the next available time.  Carto, ICE, anesthesia are requested for the procedure.  Richelle Glick also obtain CT PV protocol prior to the procedure to exclude LAA thrombus and further evaluate atrial anatomy.   2.  Acquired hypercoagulable state: Currently on Eliquis for atrial fibrillation as above.  3.  Obesity: Body mass index is 34.42 kg/m. Lifestyle modification encouraged  Current medicines are reviewed at length with the patient today.   The patient does not have concerns regarding her medicines.  The following changes were made today:  none  Labs/ tests ordered today include:  Orders Placed This Encounter  Procedures   CT CARDIAC MORPH/PULM VEIN W/CM&W/O CA SCORE   Basic metabolic panel   CBC   EKG 12-Lead     Disposition:   FU with Adenike Shidler 3 months  Signed, Kayleanna Lorman Meredith Leeds, MD  06/03/2021 9:52 AM     [place] [address] [phone] (office) [phone] (fax)

## 2021-06-10 DIAGNOSIS — Z6835 Body mass index (BMI) 35.0-35.9, adult: Secondary | ICD-10-CM | POA: Diagnosis not present

## 2021-06-10 DIAGNOSIS — I10 Essential (primary) hypertension: Secondary | ICD-10-CM | POA: Diagnosis not present

## 2021-06-10 DIAGNOSIS — I739 Peripheral vascular disease, unspecified: Secondary | ICD-10-CM | POA: Diagnosis not present

## 2021-06-10 DIAGNOSIS — M542 Cervicalgia: Secondary | ICD-10-CM | POA: Diagnosis not present

## 2021-06-16 ENCOUNTER — Encounter (HOSPITAL_BASED_OUTPATIENT_CLINIC_OR_DEPARTMENT_OTHER): Payer: Self-pay | Admitting: Cardiovascular Disease

## 2021-06-16 NOTE — Procedures (Signed)
Patient Name: Glenda [name], Glenda [name] Date: 05/27/2021 Gender: Female D.O.B: Feb 04, 1951 Age (years): 81 Referring Provider: Sherran Needs Height (inches): 36 Interpreting Physician: Shelva Majestic MD, ABSM Weight (lbs): 220 RPSGT: Carolin Coy BMI: 33 MRN: [id number] Neck Size: 16.00  CLINICAL INFORMATION Sleep Study Type: NPSG  Indication for sleep study: Fatigue, Hypertension, Obesity, Snoring, atrial fibrillation  Epworth Sleepiness Score: 10  SLEEP STUDY TECHNIQUE As per the AASM Manual for the Scoring of Sleep and Associated Events v2.3 (April 2016) with a hypopnea requiring 4% desaturations.  The channels recorded and monitored were frontal, central and occipital EEG, electrooculogram (EOG), submentalis EMG (chin), nasal and oral airflow, thoracic and abdominal wall motion, anterior tibialis EMG, snore microphone, electrocardiogram, and pulse oximetry.  MEDICATIONS acetaminophen (TYLENOL) 500 MG tablet ALPRAZolam (XANAX) 0.25 MG tablet apixaban (ELIQUIS) 5 MG TABS tablet diltiazem (CARDIZEM CD) 120 MG 24 hr capsule flecainide (TAMBOCOR) 100 MG tablet metoprolol succinate (TOPROL XL) 25 MG 24 hr tablet Multiple Vitamins-Minerals (MULTIVITAMIN WITH MINERALS) tablet PRoxetine (PAXIL) 40 MG tablet Polyvinyl Alcohol-Povidone PF (REFRESH) 1.4-0.6 % SOLN rosuvastatin (CRESTOR) 10 MG tablet Medications self-administered by patient taken the night of the study : rosuvastatin, METOPROLOL SUCCINATE, FLECAINIDE ACETATE, XANAX, PAROXETINE, DILTIAZEM HCL, apixaban  SLEEP ARCHITECTURE The study was initiated at 10:56:10 PM and ended at 4:58:10 AM.  Sleep onset time was 13.9 minutes and the sleep efficiency was 48.9%. The total sleep time was 177 minutes.  Stage REM latency was N/A minutes.  The patient spent 58.47% of the night in stage N1 sleep, 41.53% in stage N2 sleep, 0.00% in stage N3 and 0% in REM.  Alpha intrusion was absent.  Supine sleep was  4.80%.  RESPIRATORY PARAMETERS The overall apnea/hypopnea index (AHI) was 18.0 per hour. The respiratory disturbance index (RDI) was 27.8/h. There were 41 total apneas, including 41 obstructive, 0 central and 0 mixed apneas. There were 12 hypopneas and 29 RERAs.  The AHI during Stage REM sleep was N/A per hour.  AHI while supine was 0.0 per hour.  The mean oxygen saturation was 92.35%. The minimum SpO2 during sleep was 81.00%.  Moderate snoring was noted during this study.  CARDIAC DATA The 2 lead EKG demonstrated sinus rhythm. The mean heart rate was 58.15 beats per minute. Other EKG findings include: Atrial Fibrillation.  LEG MOVEMENT DATA The total PLMS were 0 with a resulting PLMS index of 0.00. Associated arousal with leg movement index was 0.0 .  IMPRESSIONS - Moderate obstructive sleep apnea occurred during this study (AHI 18.0/h; RDI 27.8/h). The overall severity may be underestimated since REM sleep was not achieved on this study.  - Moderate oxygen desaturation to a nadir of 81%. - The patient snored with moderate snoring volume. - EKG findings include Atrial Fibrillation. - Clinically significant periodic limb movements did not occur during sleep. No significant associated arousals.  DIAGNOSIS - Obstructive Sleep Apnea (G47.33) - Nocturnal Hypoxemia (G47.36)  RECOMMENDATIONS - In this patient with significant cardiovascular co-morbidities recommend a therapeutic in-lab CPAP titration to determine optimal pressure required to alleviate sleep disordered breathing. If unable to have an in-lab titration initiate Auto-PAP with EPR of 3 at 7 - 18 cm of water.  - Effort should be made to optimize nasal and oropharyngeal patency. - Avoid alcohol, sedatives and other CNS depressants that may worsen sleep apnea and disrupt normal sleep architecture. - Sleep hygiene should be reviewed to assess factors that may improve sleep quality. - Weight management (BMI 33) and regular  exercise  should be initiated or continued if appropriate.  [Electronically signed] 06/16/2021 10:43 AM  Shelva Majestic MD, [facility], [place], American Board of Sleep Medicine   NPI: [id number] [place] PH: [phone]   FX: [phone] [place]

## 2021-06-20 ENCOUNTER — Other Ambulatory Visit: Payer: Self-pay | Admitting: Cardiovascular Disease

## 2021-06-20 ENCOUNTER — Telehealth: Payer: Self-pay | Admitting: *Deleted

## 2021-06-20 DIAGNOSIS — I10 Essential (primary) hypertension: Secondary | ICD-10-CM

## 2021-06-20 DIAGNOSIS — G4736 Sleep related hypoventilation in conditions classified elsewhere: Secondary | ICD-10-CM

## 2021-06-20 DIAGNOSIS — G4733 Obstructive sleep apnea (adult) (pediatric): Secondary | ICD-10-CM

## 2021-06-20 NOTE — Telephone Encounter (Signed)
-----   Message from Troy Sine, MD sent at 06/16/2021 10:48 AM EST ----- Mariann Laster, please notify patient the results of her sleep study.  Try for approval of an in lab CPAP titration.  If unable then initiate AutoPap as prescribed.

## 2021-06-20 NOTE — Telephone Encounter (Signed)
Patient returned a call to me and was given sleep study results and recommendations. She agrees to proceed with CPAP titration.

## 2021-06-20 NOTE — Telephone Encounter (Signed)
Message left to return a call to discuss her sleep study results and recommendations.

## 2021-06-24 ENCOUNTER — Telehealth: Payer: Self-pay | Admitting: *Deleted

## 2021-06-24 NOTE — Telephone Encounter (Signed)
Patient notified of CPAP titration appointment details.

## 2021-06-29 ENCOUNTER — Other Ambulatory Visit (HOSPITAL_COMMUNITY): Payer: Self-pay | Admitting: Nurse Practitioner

## 2021-07-23 ENCOUNTER — Ambulatory Visit (HOSPITAL_BASED_OUTPATIENT_CLINIC_OR_DEPARTMENT_OTHER): Payer: Medicare PPO | Attending: Cardiovascular Disease | Admitting: Cardiovascular Disease

## 2021-07-23 ENCOUNTER — Other Ambulatory Visit: Payer: Self-pay

## 2021-07-23 DIAGNOSIS — G4736 Sleep related hypoventilation in conditions classified elsewhere: Secondary | ICD-10-CM | POA: Diagnosis not present

## 2021-07-23 DIAGNOSIS — G4733 Obstructive sleep apnea (adult) (pediatric): Secondary | ICD-10-CM | POA: Diagnosis not present

## 2021-07-23 DIAGNOSIS — I10 Essential (primary) hypertension: Secondary | ICD-10-CM

## 2021-08-07 ENCOUNTER — Encounter (HOSPITAL_BASED_OUTPATIENT_CLINIC_OR_DEPARTMENT_OTHER): Payer: Self-pay | Admitting: Cardiovascular Disease

## 2021-08-07 ENCOUNTER — Other Ambulatory Visit: Payer: Self-pay | Admitting: Cardiovascular Disease

## 2021-08-07 DIAGNOSIS — I4819 Other persistent atrial fibrillation: Secondary | ICD-10-CM

## 2021-08-07 DIAGNOSIS — I10 Essential (primary) hypertension: Secondary | ICD-10-CM

## 2021-08-07 NOTE — Procedures (Signed)
? ? ? ? ? ?Patient Name: Glenda [name], Glenda [name] ?Study Date: 07/23/2021 ?Gender: Female ?D.O.B: [birth year] ?Age (years): 64 ?Referring Provider: Shelva Majestic MD, ABSM ?Height (inches): 68 ?Interpreting Physician: Shelva Majestic MD, ABSM ?Weight (lbs): 220 ?RPSGT: Zadie Rhine ?BMI: 33 ?MRN: [id number] ?Neck Size: 16.00 ? ?CLINICAL INFORMATION ?The patient is referred for a CPAP titration to treat sleep apnea. ? ?Date of NPSG:  05/27/2021: AHI 18/h, RDI 27.8/h; with absent REM sleep, O2 nadir 81% ? ?SLEEP STUDY TECHNIQUE ?As per the AASM Manual for the Scoring of Sleep and Associated Events v2.3 (April 2016) with a hypopnea requiring 4% desaturations. ? ?The channels recorded and monitored were frontal, central and occipital EEG, electrooculogram (EOG), submentalis EMG (chin), nasal and oral airflow, thoracic and abdominal wall motion, anterior tibialis EMG, snore microphone, electrocardiogram, and pulse oximetry. Continuous positive airway pressure (CPAP) was initiated at the beginning of the study and titrated to treat sleep-disordered breathing. ? ?MEDICATIONS ?acetaminophen (TYLENOL) 500 MG tablet ?ALPRAZolam (XANAX) 0.25 MG tablet ?apixaban (ELIQUIS) 5 MG TABS tablet ?diltiazem (CARDIZEM CD) 120 MG 24 hr capsule ?flecainide (TAMBOCOR) 100 MG tablet ?metoprolol succinate (TOPROL XL) 25 MG 24 hr tablet ?Multiple Vitamins-Minerals (MULTIVITAMIN WITH MINERALS) tablet ?PARoxetine (PAXIL) 40 MG tablet ?Polyvinyl Alcohol-Povidone PF (REFRESH) 1.4-0.6 % SOLN ?rosuvastatin (CRESTOR) 10 MG tablet ?Medications self-administered by patient taken the night of the study : Eliquis, FLECAINIDE ACETATE, METOPROLOL SUCCINATE ? ?TECHNICIAN COMMENTS ?Comments added by technician: Pt went to restroom once. Patient was restless all through the night. ?Comments added by scorer: N/A ? ?RESPIRATORY PARAMETERS ?Optimal PAP Pressure (cm): 11 AHI at Optimal Pressure (/hr): 0 ?Overall Minimal O2 (%): 83.0 Supine % at Optimal Pressure  (%): 100 ?Minimal O2 at Optimal Pressure (%): 90.0  ? ?SLEEP ARCHITECTURE ?The study was initiated at 10:18:23 PM and ended at 4:51:48 AM. ? ?Sleep onset time was 3.8 minutes and the sleep efficiency was 77.3%%. The total sleep time was 304 minutes. ? ?The patient spent 7.6%% of the night in stage N1 sleep, 85.5%% in stage N2 sleep, 6.6%% in stage N3 and 0.3% in REM.Stage REM latency was 140.5 minutes ? ?Wake after sleep onset was 85.7. Alpha intrusion was absent. Supine sleep was 60.69%. ? ?CARDIAC DATA ?The 2 lead EKG demonstrated sinus rhythm. The mean heart rate was 56.0 beats per minute. Other EKG findings include: None. ? ?LEG MOVEMENT DATA ?The total Periodic Limb Movements of Sleep (PLMS) were 0. The PLMS index was 0.0. A PLMS index of <15 is considered normal in adults. ? ?IMPRESSIONS ?- CPAP was initiated at 6 cm and was titrated to optimal PAP pressure at 11 cm of water (AHI 0, RDI 0, O2 nadir 90%). REM sleep was absent throughout the study. ?- Moderate oxygen desaturations to a nadir of 83% at 6 and 10 cm of water. ?- No snoring was audible during this study. ?- No cardiac abnormalities were observed during this study. ?- Clinically significant periodic limb movements were not noted during this study. Arousals associated with PLMs were rare. ? ?DIAGNOSIS ?- Obstructive Sleep Apnea (G47.33) ? ?RECOMMENDATIONS ?- Recommend an initial trial of CPAP Auto therapy with EPR of 3 at 11- 16 cm H2O with heated humidification. A Small size Resmed Full Face AirFit F20 mask was used for the titration. ?- Effort should be made to optimize nasal and oropharyngeal patency. ?- Avoid alcohol, sedatives and other CNS depressants that may worsen sleep apnea and disrupt normal sleep architecture. ?- Sleep hygiene should be reviewed to assess factors that may  improve sleep quality. ?- Weight management (BMI 33) and regular exercise should be initiated or continued. ?- Recommend a download and sleep clinic evaluation after 4  weeks of therapy ? ? ?[Electronically signed] 08/07/2021 08:19 AM ? ?Shelva Majestic MD, [facility], ABSM ?[place], [place] Board of Sleep Medicine ? ? ?NPI: [id number] ? ?[place] ?PH: [phone]   FX: [phone] ?ACCREDITED BY THE AMERICAN ACADEMY OF SLEEP MEDICINE ? ?

## 2021-08-08 ENCOUNTER — Encounter (HOSPITAL_COMMUNITY): Payer: Self-pay

## 2021-08-12 ENCOUNTER — Telehealth: Payer: Self-pay | Admitting: *Deleted

## 2021-08-12 NOTE — Telephone Encounter (Signed)
Patient has been notified that her CPAP titration study has been completed and CPAP order will be sent to [facility] for processing. ?

## 2021-08-22 ENCOUNTER — Other Ambulatory Visit: Payer: Medicare PPO | Admitting: *Deleted

## 2021-08-22 DIAGNOSIS — Z01812 Encounter for preprocedural laboratory examination: Secondary | ICD-10-CM

## 2021-08-22 DIAGNOSIS — I10 Essential (primary) hypertension: Secondary | ICD-10-CM | POA: Diagnosis not present

## 2021-08-22 DIAGNOSIS — I4819 Other persistent atrial fibrillation: Secondary | ICD-10-CM | POA: Diagnosis not present

## 2021-08-22 DIAGNOSIS — I639 Cerebral infarction, unspecified: Secondary | ICD-10-CM | POA: Diagnosis not present

## 2021-08-22 LAB — BASIC METABOLIC PANEL
BUN/Creatinine Ratio: 22 (ref 12–28)
BUN: 17 mg/dL (ref 8–27)
CO2: 26 mmol/L (ref 20–29)
Calcium: 9.2 mg/dL (ref 8.7–10.3)
Chloride: 104 mmol/L (ref 96–106)
Creatinine, Ser: 0.79 mg/dL (ref 0.57–1.00)
Glucose: 136 mg/dL — ABNORMAL HIGH (ref 70–99)
Potassium: 4.7 mmol/L (ref 3.5–5.2)
Sodium: 142 mmol/L (ref 134–144)
eGFR: 80 mL/min/{1.73_m2} (ref >59)

## 2021-08-22 LAB — CBC
Hematocrit: 39.7 % (ref 34.0–46.6)
Hemoglobin: 13.2 g/dL (ref 11.1–15.9)
MCH: 31.1 pg (ref 26.6–33.0)
MCHC: 33.2 g/dL (ref 31.5–35.7)
MCV: 94 fL (ref 79–97)
Platelets: 280 10*3/uL (ref 150–450)
RBC: 4.24 x10E6/uL (ref 3.77–5.28)
RDW: 12.5 % (ref 11.7–15.4)
WBC: 7.6 10*3/uL (ref 3.4–10.8)

## 2021-08-28 ENCOUNTER — Telehealth (HOSPITAL_COMMUNITY): Payer: Self-pay | Admitting: *Deleted

## 2021-08-28 NOTE — Telephone Encounter (Signed)
Reaching out to patient to offer assistance regarding upcoming cardiac imaging study; pt verbalizes understanding of appt date/time, parking situation and where to check in, pre-test NPO status and verified current allergies; name and call back number provided for further questions should they arise ? ?Gordy Clement RN Navigator Cardiac Imaging ?[facility] ?[phone] office ?[phone] cell ? ?Patient to take her morning HR control medications two hours prior to her cardiac CT. She is aware to arrive at 7:30am. ?

## 2021-08-29 ENCOUNTER — Ambulatory Visit (HOSPITAL_COMMUNITY)
Admission: RE | Admit: 2021-08-29 | Discharge: 2021-08-29 | Disposition: A | Discharge status: Home / Self Care | Payer: Medicare PPO | Source: Ambulatory Visit | Attending: Cardiology | Admitting: Cardiology

## 2021-08-29 DIAGNOSIS — I4819 Other persistent atrial fibrillation: Secondary | ICD-10-CM

## 2021-08-29 MED ORDER — IOHEXOL 350 MG/ML SOLN
100.0000 mL | Freq: Once | INTRAVENOUS | Status: AC | PRN
  Administered 2021-08-29: 100 mL via INTRAVENOUS

## 2021-08-30 ENCOUNTER — Encounter: Payer: Self-pay | Admitting: Cardiology

## 2021-09-03 NOTE — Pre-Procedure Instructions (Signed)
Instructed patient on the following items: Arrival time 0530 Nothing to eat or drink after midnight No meds AM of procedure Responsible person to drive you home and stay with you for 24 hrs  Have you missed any doses of anti-coagulant Eliquis- hasn't missed any doses    

## 2021-09-04 NOTE — Anesthesia Preprocedure Evaluation (Addendum)
Anesthesia Evaluation  ?Patient identified by MRN, date of birth, ID band ?Patient awake ? ? ? ?Reviewed: ?Allergy & Precautions, NPO status , Patient's Chart, lab work & pertinent test results ? ?History of Anesthesia Complications ?Negative for: history of anesthetic complications ? ?Airway ?Mallampati: III ? ?TM Distance: >3 FB ?Neck ROM: Full ? ? ? Dental ? ?(+) Teeth Intact, Dental Advisory Given ?  ?Pulmonary ?sleep apnea , former smoker,  ?  ?Pulmonary exam normal ? ? ? ? ? ? ? Cardiovascular ?hypertension, Pt. on medications and Pt. on home beta blockers ?Normal cardiovascular exam+ dysrhythmias Atrial Fibrillation  ? ? ?  ?Neuro/Psych ?Anxiety CVA (incidental finding)   ? GI/Hepatic ?negative GI ROS, Neg liver ROS,   ?Endo/Other  ?negative endocrine ROS ? Renal/GU ?negative Renal ROS  ?negative genitourinary ?  ?Musculoskeletal ?negative musculoskeletal ROS ?(+)  ? Abdominal ?  ?Peds ? Hematology ?negative hematology ROS ?(+)   ?Anesthesia Other Findings ?Eliquis ? Reproductive/Obstetrics ? ?  ? ? ? ? ? ? ? ? ? ? ? ? ? ?  ?  ? ? ? ? ? ? ? ?Anesthesia Physical ?Anesthesia Plan ? ?ASA: 3 ? ?Anesthesia Plan: General  ? ?Post-op Pain Management: Minimal or no pain anticipated  ? ?Induction: Intravenous ? ?PONV Risk Score and Plan: 3 and Ondansetron, Dexamethasone, Treatment may vary due to age or medical condition and Midazolam ? ?Airway Management Planned: Oral ETT ? ?Additional Equipment: None ? ?Intra-op Plan:  ? ?Post-operative Plan: Extubation in OR ? ?Informed Consent: I have reviewed the patients History and Physical, chart, labs and discussed the procedure including the risks, benefits and alternatives for the proposed anesthesia with the patient or authorized representative who has indicated his/her understanding and acceptance.  ? ? ? ?Dental advisory given ? ?Plan Discussed with:  ? ?Anesthesia Plan Comments:   ? ? ? ? ? ?Anesthesia Quick Evaluation ? ?

## 2021-09-05 ENCOUNTER — Encounter (HOSPITAL_COMMUNITY): Payer: Self-pay | Admitting: Cardiology

## 2021-09-05 ENCOUNTER — Other Ambulatory Visit: Payer: Self-pay

## 2021-09-05 ENCOUNTER — Encounter (HOSPITAL_COMMUNITY)
Admission: RE | Disposition: A | Discharge status: Home / Self Care | Payer: Self-pay | Source: Home / Self Care | Attending: Cardiology

## 2021-09-05 ENCOUNTER — Ambulatory Visit (HOSPITAL_COMMUNITY)
Admission: RE | Admit: 2021-09-05 | Discharge: 2021-09-05 | Disposition: A | Discharge status: Home / Self Care | Payer: Medicare PPO | Attending: Cardiology | Admitting: Cardiology

## 2021-09-05 ENCOUNTER — Ambulatory Visit (HOSPITAL_BASED_OUTPATIENT_CLINIC_OR_DEPARTMENT_OTHER): Payer: Medicare PPO | Admitting: Certified Registered"

## 2021-09-05 ENCOUNTER — Ambulatory Visit (HOSPITAL_COMMUNITY): Payer: Medicare PPO | Admitting: Certified Registered"

## 2021-09-05 DIAGNOSIS — I4892 Unspecified atrial flutter: Secondary | ICD-10-CM | POA: Insufficient documentation

## 2021-09-05 DIAGNOSIS — Z8673 Personal history of transient ischemic attack (TIA), and cerebral infarction without residual deficits: Secondary | ICD-10-CM | POA: Diagnosis not present

## 2021-09-05 DIAGNOSIS — I10 Essential (primary) hypertension: Secondary | ICD-10-CM

## 2021-09-05 DIAGNOSIS — I4891 Unspecified atrial fibrillation: Secondary | ICD-10-CM | POA: Diagnosis not present

## 2021-09-05 DIAGNOSIS — G4730 Sleep apnea, unspecified: Secondary | ICD-10-CM | POA: Insufficient documentation

## 2021-09-05 DIAGNOSIS — Z87891 Personal history of nicotine dependence: Secondary | ICD-10-CM | POA: Insufficient documentation

## 2021-09-05 DIAGNOSIS — I4819 Other persistent atrial fibrillation: Secondary | ICD-10-CM | POA: Diagnosis not present

## 2021-09-05 DIAGNOSIS — F419 Anxiety disorder, unspecified: Secondary | ICD-10-CM

## 2021-09-05 DIAGNOSIS — R7303 Prediabetes: Secondary | ICD-10-CM | POA: Diagnosis not present

## 2021-09-05 HISTORY — PX: ATRIAL FIBRILLATION ABLATION: EP1191

## 2021-09-05 LAB — POCT ACTIVATED CLOTTING TIME
Activated Clotting Time: 335 seconds
Activated Clotting Time: 329 seconds
Activated Clotting Time: 329 seconds
Activated Clotting Time: 341 seconds

## 2021-09-05 SURGERY — ATRIAL FIBRILLATION ABLATION
Anesthesia: General

## 2021-09-05 MED ORDER — SODIUM CHLORIDE 0.9% FLUSH: 3.0000 mL | INTRAVENOUS | Status: DC | PRN

## 2021-09-05 MED ORDER — SUGAMMADEX SODIUM 200 MG/2ML IV SOLN
INTRAVENOUS | Status: DC | PRN
  Administered 2021-09-05: 200 mg via INTRAVENOUS

## 2021-09-05 MED ORDER — SODIUM CHLORIDE 0.9 % IV SOLN: INTRAVENOUS | Status: DC

## 2021-09-05 MED ORDER — PROTAMINE SULFATE 10 MG/ML IV SOLN
INTRAVENOUS | Status: DC | PRN
  Administered 2021-09-05: 40 mg via INTRAVENOUS

## 2021-09-05 MED ORDER — SODIUM CHLORIDE 0.9% FLUSH: 3.0000 mL | Freq: Two times a day (BID) | INTRAVENOUS | Status: DC

## 2021-09-05 MED ORDER — ONDANSETRON HCL 4 MG/2ML IJ SOLN: 4.0000 mg | Freq: Four times a day (QID) | INTRAMUSCULAR | Status: DC | PRN

## 2021-09-05 MED ORDER — PHENYLEPHRINE HCL-NACL 20-0.9 MG/250ML-% IV SOLN
INTRAVENOUS | Status: DC | PRN
Start: 2021-09-05 — End: 2021-09-05
  Administered 2021-09-05: 25 ug/min via INTRAVENOUS

## 2021-09-05 MED ORDER — DOBUTAMINE INFUSION FOR EP/ECHO/NUC (1000 MCG/ML)
INTRAVENOUS | Status: DC | PRN
  Administered 2021-09-05: 20 ug/kg/min via INTRAVENOUS

## 2021-09-05 MED ORDER — SODIUM CHLORIDE 0.9 % IV SOLN: 250.0000 mL | INTRAVENOUS | Status: DC | PRN

## 2021-09-05 MED ORDER — HEPARIN SODIUM (PORCINE) 1000 UNIT/ML IJ SOLN
INTRAMUSCULAR | Status: DC | PRN
  Administered 2021-09-05: 1000 [IU] via INTRAVENOUS

## 2021-09-05 MED ORDER — HEPARIN SODIUM (PORCINE) 1000 UNIT/ML IJ SOLN
INTRAMUSCULAR | Status: DC | PRN
Start: 2021-09-05 — End: 2021-09-05
  Administered 2021-09-05: 1000 [IU] via INTRAVENOUS
  Administered 2021-09-05 (×2): 2000 [IU] via INTRAVENOUS
  Administered 2021-09-05: 1000 [IU] via INTRAVENOUS
  Administered 2021-09-05: 14000 [IU] via INTRAVENOUS

## 2021-09-05 MED ORDER — HEPARIN SODIUM (PORCINE) 1000 UNIT/ML IJ SOLN
INTRAMUSCULAR | Status: AC
  Filled 2021-09-05: qty 10

## 2021-09-05 MED ORDER — PHENYLEPHRINE 80 MCG/ML (10ML) SYRINGE FOR IV PUSH (FOR BLOOD PRESSURE SUPPORT)
PREFILLED_SYRINGE | INTRAVENOUS | Status: DC | PRN
  Administered 2021-09-05: 240 ug via INTRAVENOUS
  Administered 2021-09-05: 80 ug via INTRAVENOUS
  Administered 2021-09-05: 240 ug via INTRAVENOUS
  Administered 2021-09-05: 80 ug via INTRAVENOUS

## 2021-09-05 MED ORDER — PROPOFOL 10 MG/ML IV BOLUS
INTRAVENOUS | Status: DC | PRN
  Administered 2021-09-05: 130 mg via INTRAVENOUS

## 2021-09-05 MED ORDER — ONDANSETRON HCL 4 MG/2ML IJ SOLN
INTRAMUSCULAR | Status: DC | PRN
Start: 2021-09-05 — End: 2021-09-05
  Administered 2021-09-05: 4 mg via INTRAVENOUS

## 2021-09-05 MED ORDER — DEXAMETHASONE SODIUM PHOSPHATE 10 MG/ML IJ SOLN
INTRAMUSCULAR | Status: DC | PRN
Start: 2021-09-05 — End: 2021-09-05
  Administered 2021-09-05: 5 mg via INTRAVENOUS

## 2021-09-05 MED ORDER — FENTANYL CITRATE (PF) 100 MCG/2ML IJ SOLN
INTRAMUSCULAR | Status: DC | PRN
  Administered 2021-09-05 (×2): 50 ug via INTRAVENOUS

## 2021-09-05 MED ORDER — HEPARIN (PORCINE) IN NACL 1000-0.9 UT/500ML-% IV SOLN
INTRAVENOUS | Status: DC | PRN
  Administered 2021-09-05 (×5): 500 mL

## 2021-09-05 MED ORDER — DOBUTAMINE INFUSION FOR EP/ECHO/NUC (1000 MCG/ML)
INTRAVENOUS | Status: AC
  Filled 2021-09-05: qty 250

## 2021-09-05 MED ORDER — ACETAMINOPHEN 325 MG PO TABS
650.0000 mg | ORAL_TABLET | ORAL | Status: DC | PRN
  Filled 2021-09-05: qty 2

## 2021-09-05 MED ORDER — LIDOCAINE 2% (20 MG/ML) 5 ML SYRINGE
INTRAMUSCULAR | Status: DC | PRN
  Administered 2021-09-05: 100 mg via INTRAVENOUS

## 2021-09-05 MED ORDER — ROCURONIUM BROMIDE 10 MG/ML (PF) SYRINGE
PREFILLED_SYRINGE | INTRAVENOUS | Status: DC | PRN
Start: 2021-09-05 — End: 2021-09-05
  Administered 2021-09-05: 70 mg via INTRAVENOUS

## 2021-09-05 MED ORDER — MIDAZOLAM HCL 2 MG/2ML IJ SOLN
INTRAMUSCULAR | Status: DC | PRN
  Administered 2021-09-05: 2 mg via INTRAVENOUS

## 2021-09-05 MED ORDER — HEPARIN (PORCINE) IN NACL 1000-0.9 UT/500ML-% IV SOLN
INTRAVENOUS | Status: AC
  Filled 2021-09-05: qty 2500

## 2021-09-05 MED ORDER — APIXABAN 5 MG PO TABS
5.0000 mg | ORAL_TABLET | Freq: Once | ORAL | Status: AC
Start: 2021-09-05 — End: 2021-09-05
  Administered 2021-09-05: 5 mg via ORAL
  Filled 2021-09-05: qty 1

## 2021-09-05 SURGICAL SUPPLY — 21 items
BAG SNAP BAND KOVER 36X36 (MISCELLANEOUS) ×1 IMPLANT
BLANKET WARM UNDERBOD FULL ACC (MISCELLANEOUS) ×2 IMPLANT
CATH OCTARAY 2.0 F 3-3-3-3-3 (CATHETERS) ×1 IMPLANT
CATH S CIRCA THERM PROBE 10F (CATHETERS) ×1 IMPLANT
CATH SMTCH THERMOCOOL SF DF (CATHETERS) ×1 IMPLANT
CATH SOUNDSTAR ECO 8FR (CATHETERS) ×1 IMPLANT
CATH WEB BI DIR CSDF CRV REPRO (CATHETERS) ×1 IMPLANT
CLOSURE PERCLOSE PROSTYLE (VASCULAR PRODUCTS) ×4 IMPLANT
COVER SWIFTLINK CONNECTOR (BAG) ×2 IMPLANT
KIT VERSACROSS STEERABLE D1 (CATHETERS) ×1 IMPLANT
MAT PREVALON FULL STRYKER (MISCELLANEOUS) ×1 IMPLANT
PACK EP LATEX FREE (CUSTOM PROCEDURE TRAY) ×2
PACK EP LF (CUSTOM PROCEDURE TRAY) ×1 IMPLANT
PAD DEFIB RADIO PHYSIO CONN (PAD) ×2 IMPLANT
PATCH CARTO3 (PAD) ×1 IMPLANT
SHEATH CARTO VIZIGO SM CVD (SHEATH) ×1 IMPLANT
SHEATH PINNACLE 7F 10CM (SHEATH) ×1 IMPLANT
SHEATH PINNACLE 8F 10CM (SHEATH) ×2 IMPLANT
SHEATH PINNACLE 9F 10CM (SHEATH) ×1 IMPLANT
SHEATH PROBE COVER 6X72 (BAG) ×1 IMPLANT
TUBING SMART ABLATE COOLFLOW (TUBING) ×1 IMPLANT

## 2021-09-05 NOTE — Anesthesia Procedure Notes (Signed)
Procedure Name: Intubation ?Date/Time: 09/05/2021 7:54 AM ?Performed by: Barrington Ellison, CRNA ?Pre-anesthesia Checklist: Patient identified, Emergency Drugs available, Suction available and Patient being monitored ?Patient Re-evaluated:Patient Re-evaluated prior to induction ?Oxygen Delivery Method: Circle System Utilized ?Preoxygenation: Pre-oxygenation with 100% oxygen ?Induction Type: IV induction ?Ventilation: Mask ventilation without difficulty and Two handed mask ventilation required ?Laryngoscope Size: Glidescope and 3 ?Grade View: Grade I ?Tube type: Oral ?Tube size: 7.0 mm ?Number of attempts: 2 ?Airway Equipment and Method: Oral airway, Rigid stylet and Video-laryngoscopy ?Placement Confirmation: ETT inserted through vocal cords under direct vision, positive ETCO2 and breath sounds checked- equal and bilateral ?Secured at: 21 cm ?Tube secured with: Tape ?Dental Injury: Teeth and Oropharynx as per pre-operative assessment  ?Difficulty Due To: Difficulty was anticipated and Difficult Airway- due to anterior larynx ?Future Recommendations: Recommend- induction with short-acting agent, and alternative techniques readily available ?Comments: DL x 1 Mac 3, grade 3 view, unable to pass ETT, DL x 1 Video scope, grade 1 view, recommend glidescope intubations ? ? ? ? ?

## 2021-09-05 NOTE — H&P (Signed)
? ?Electrophysiology Office Note ? ? ?Date:  09/05/2021  ? ?ID:  Glenda [name], DOB 1950-11-02, MRN [id number] ? ?PCP:  Martinique, Betty G, MD  ?Cardiologist:   ?Primary Electrophysiologist:  Maudie Shingledecker Meredith Leeds, MD   ? ?Chief Complaint: AF ?  ?History of Present Illness: ?Glenda [name] is a 71 y.o. female who is being seen today for the evaluation of AF at the request of No ref. provider found. Presenting today for electrophysiology evaluation. ? ?She has a history significant for hypertension, prediabetes, prior stroke, and persistent atrial fibrillation.  Atrial fibrillation was discovered during hospital visit in 2013.  She had neck surgery at the time.  She was noted to be in atrial fibrillation on her preoperative ECG.  She has had multiple cardioversions.  She was also loaded on flecainide and cardioverted with early return of atrial fibrillation. ? ?Today, denies symptoms of palpitations, chest pain, shortness of breath, orthopnea, PND, lower extremity edema, claudication, dizziness, presyncope, syncope, bleeding, or neurologic sequela. The patient is tolerating medications without difficulties. Plan ablation today.  ? ? ?Past Medical History:  ?Diagnosis Date  ? Allergic rhinitis   ? Allergy   ? Anxiety   ? Arthritis   ? Cataract   ? Heart murmur   ? History of colonic polyps   ? History of PSVT (paroxysmal supraventricular tachycardia)   ? Hypertension   ? Impaired glucose tolerance   ? Obesity   ? PAT (paroxysmal atrial tachycardia) ([place])   ? Pre-diabetes   ? hx, not currently being monitor for diabetes, diet controlled  ? Stroke [facility]) 08/2020  ? Scan revealed stroke in history  ? Tobacco user   ? Vertigo   ? started 3 weeks ago- after fluid trapped in ear  ? ?Past Surgical History:  ?Procedure Laterality Date  ? ankle sx-left fx    ? ANTERIOR CERVICAL DECOMP/DISCECTOMY FUSION N/A 10/28/2020  ? Procedure: Anterior Cervical Decompression Fusion - Cervical Four-Cervical Five - Cervical Five-Cervical Six -  Cervical Six- Cervical Seven;  Surgeon: Kary Kos, MD;  Location: [place];  Service: Neurosurgery;  Laterality: N/A;  Anterior Cervical Decompression Fusion - Cervical Four-Cervical Five - Cervical Five-Cervical Six - Cervical Six- Cervical Seven  ? CARDIOVERSION N/A 11/28/2020  ? Procedure: CARDIOVERSION;  Surgeon: Josue Hector, MD;  Location: [facility] ENDOSCOPY;  Service: Cardiovascular;  Laterality: N/A;  ? CARDIOVERSION N/A 04/15/2021  ? Procedure: CARDIOVERSION;  Surgeon: [name], MD;  Location: [facility] ENDOSCOPY;  Service: Cardiovascular;  Laterality: N/A;  ? CATARACT EXTRACTION    ? both eyes  ? CHOLECYSTECTOMY N/A 01/11/2018  ? Procedure: LAPAROSCOPIC CHOLECYSTECTOMY WITH INTRAOPERATIVE CHOLANGIOGRAM ERAS PATHWAY;  Surgeon: Alphonsa Overall, MD;  Location: [place];  Service: General;  Laterality: N/A;  ? COLONOSCOPY  04/23/2005  ? DIAGNOSTIC LAPAROSCOPY    ? EYE SURGERY    ? foot sx    ? with the ankle surgery  ? POLYPECTOMY    ? ? ? ?Current Facility-Administered Medications  ?Medication Dose Route Frequency Provider Last Rate Last Admin  ? 0.9 %  sodium chloride infusion   Intravenous Continuous Constance Haw, MD 50 mL/hr at 09/05/21 0603 New Bag at 09/05/21 0603  ? ? ?Allergies:   Amoxicillin and Erythromycin  ? ?Social History:  The patient  reports that she quit smoking about 10 months ago. Her smoking use included cigarettes. She smoked an average of .5 packs per day. She has never used smokeless tobacco. She reports current alcohol use of about  2.0 standard drinks per week. She reports that she does not use drugs.  ? ?Family History:  The patient's family history includes Alzheimer's disease in an other family member; Dementia in her father; Healthy in her mother, sister, and sister.  ? ?ROS:  Please see the history of present illness.   Otherwise, review of systems is positive for none.   All other systems are reviewed and negative.  ? ?PHYSICAL EXAM: ?VS:  BP 114/60   Pulse 60   Temp 98.2 ?F  (36.8 ?C) (Oral)   Resp 18   Ht '5\' 7"'$  (1.702 m)   Wt 99.8 kg   SpO2 94%   BMI 34.46 kg/m?  , BMI Body mass index is 34.46 kg/m?. ?GEN: Well nourished, well developed, in no acute distress  ?HEENT: normal  ?Neck: no JVD, carotid bruits, or masses ?Cardiac: irregular; no murmurs, rubs, or gallops,no edema  ?Respiratory:  clear to auscultation bilaterally, normal work of breathing ?GI: soft, nontender, nondistended, + BS ?MS: no deformity or atrophy  ?Skin: warm and dry ?Neuro:  Strength and sensation are intact ?Psych: euthymic mood, full affect ? ?Recent Labs: ?10/28/2020: Magnesium 1.9; TSH 0.607 ?08/22/2021: BUN 17; Creatinine, Ser 0.79; Hemoglobin 13.2; Platelets 280; Potassium 4.7; Sodium 142  ? ? ?Lipid Panel  ?   ?Component Value Date/Time  ? CHOL 170 02/18/2021 1013  ? TRIG 96.0 02/18/2021 1013  ? HDL 39.20 02/18/2021 1013  ? CHOLHDL 4 02/18/2021 1013  ? VLDL 19.2 02/18/2021 1013  ? LDLCALC 112 (H) 02/18/2021 1013  ? LDLCALC 142 (H) 11/24/2019 1052  ? LDLDIRECT 170.5 06/06/2010 0811  ? ? ? ?Wt Readings from Last 3 Encounters:  ?09/05/21 99.8 kg  ?07/23/21 99.8 kg  ?06/03/21 102.7 kg  ?  ? ? ?Other studies Reviewed: ?Additional studies/ records that were reviewed today include: TTE 03/06/21  ?Review of the above records today demonstrates:  ? 1. Left ventricular ejection fraction, by estimation, is 60 to 65%. The  ?left ventricle has normal function. The left ventricle has no regional  ?wall motion abnormalities. Left ventricular diastolic parameters are  ?indeterminate.  ? 2. Right ventricular systolic function is normal. The right ventricular  ?size is normal. There is normal pulmonary artery systolic pressure.  ? 3. The mitral valve is normal in structure. No evidence of mitral valve  ?regurgitation. No evidence of mitral stenosis.  ? 4. The aortic valve was not well visualized. Aortic valve regurgitation  ?is not visualized.  ? 5. The inferior vena cava is normal in size with greater than 50%   ?respiratory variability, suggesting right atrial pressure of 3 mmHg.  ? ?Myoview 03/18/2021 ?  The study is normal. The study is low risk. ?  No ST deviation was noted. ?  Left ventricular function is normal. Nuclear stress EF: 72 %. The left ventricular ejection fraction is hyperdynamic (>65%). End diastolic cavity size is normal. ?  Prior study not available for comparison. ? ?Cardiac monitor 02/16/2021 personally reviewed ?Persistent atrial fibrillation (Burden 100%) ?Ventricular rates ranging from 34-173 bpm (avg of 72 bpm). ?No prolonged pauses ? ?ASSESSMENT AND PLAN: ? ?1.  Persistent atrial fibrillation: Glenda [name] has presented today for surgery, with the diagnosis of AF.  The various methods of treatment have been discussed with the patient and family. After consideration of risks, benefits and other options for treatment, the patient has consented to  Procedure(s): ?Catheter ablation as a surgical intervention .  Risks include but not limited to complete heart  block, stroke, esophageal damage, nerve damage, bleeding, vascular damage, tamponade, perforation, MI, and death. The patient's history has been reviewed, patient examined, no change in status, stable for surgery.  I have reviewed the patient's chart and labs.  Questions were answered to the patient's satisfaction.   ? ?Allegra Lai, MD ?09/05/2021 ?7:03 AM  ? ?

## 2021-09-05 NOTE — Progress Notes (Signed)
Up and walked and tolerated well; bilat groins stable, no bleeding or hematoma; ok to d/c per Dr Curt Bears ?

## 2021-09-05 NOTE — Anesthesia Postprocedure Evaluation (Signed)
Anesthesia Post Note ? ?Patient: Glenda [name] ? ?Procedure(s) Performed: ATRIAL FIBRILLATION ABLATION ? ?  ? ?Patient location during evaluation: Cath Lab ?Anesthesia Type: General ?Level of consciousness: awake and alert ?Pain management: pain level controlled ?Vital Signs Assessment: post-procedure vital signs reviewed and stable ?Respiratory status: spontaneous breathing, nonlabored ventilation and respiratory function stable ?Cardiovascular status: blood pressure returned to baseline and stable ?Postop Assessment: no apparent nausea or vomiting ?Anesthetic complications: no ? ? ?There were no known notable events for this encounter. ? ?Last Vitals:  ?Vitals:  ? 09/05/21 1046 09/05/21 1055  ?BP: (!) 117/37 118/65  ?Pulse: (!) 50 (!) 52  ?Resp: 16 18  ?Temp: 36.5 ?C   ?SpO2: 91% 98%  ?  ?Last Pain:  ?Vitals:  ? 09/05/21 0600  ?TempSrc:   ?PainSc: 0-No pain  ? ? ?  ?  ?  ?  ?  ?  ? ?Lidia Collum ? ? ? ? ?

## 2021-09-05 NOTE — Progress Notes (Signed)
States post procedure has developed chest discomfort 2/10 and that it feels like indigestion and states does not need pain medication at present time ?

## 2021-09-05 NOTE — Transfer of Care (Signed)
Immediate Anesthesia Transfer of Care Note ? ?Patient: Glenda [name] ? ?Procedure(s) Performed: ATRIAL FIBRILLATION ABLATION ? ?Patient Location: PACU ? ?Anesthesia Type:General ? ?Level of Consciousness: awake and oriented ? ?Airway & Oxygen Therapy: Patient Spontanous Breathing and Patient connected to nasal cannula oxygen ? ?Post-op Assessment: Report given to RN ? ?Post vital signs: Reviewed and stable ? ?Last Vitals:  ?Vitals Value Taken Time  ?BP 94/48 09/05/21 1013  ?Temp    ?Pulse 52 09/05/21 1014  ?Resp 15 09/05/21 1014  ?SpO2 100 % 09/05/21 1014  ?Vitals shown include unvalidated device data. ? ?Last Pain:  ?Vitals:  ? 09/05/21 0600  ?TempSrc:   ?PainSc: 0-No pain  ?   ? ?  ? ?Complications: There were no known notable events for this encounter. ?

## 2021-09-09 ENCOUNTER — Encounter: Payer: Self-pay | Admitting: Cardiology

## 2021-09-09 NOTE — Telephone Encounter (Signed)
Pt reports she has not had a bowel movement since ablation. ?Aware that sounds like she is experiencing gastroparesis post anesthesia. ?Discussed w/ Dr. Curt Bears ?Aware she may take the OTC Miralax she has on hand at home. ?She will call back if this does not resolve her issue. ?She appreciates the call and agreeable to plan.  ?

## 2021-09-11 ENCOUNTER — Telehealth: Payer: Self-pay | Admitting: *Deleted

## 2021-09-11 NOTE — Telephone Encounter (Signed)
Patient called in to check the order status of her CPAP machine. The contact information was provided to her for Ivin Booty at [facility] will be able to provide this information to the patient. ?

## 2021-10-07 ENCOUNTER — Encounter (HOSPITAL_COMMUNITY): Payer: Self-pay | Admitting: Nurse Practitioner

## 2021-10-07 ENCOUNTER — Ambulatory Visit (HOSPITAL_COMMUNITY)
Admission: RE | Admit: 2021-10-07 | Discharge: 2021-10-07 | Disposition: A | Discharge status: Home / Self Care | Payer: Medicare PPO | Source: Ambulatory Visit | Attending: Nurse Practitioner | Admitting: Nurse Practitioner

## 2021-10-07 VITALS — BP 144/64 | HR 50 | Ht 67.0 in | Wt 229.4 lb

## 2021-10-07 DIAGNOSIS — E669 Obesity, unspecified: Secondary | ICD-10-CM | POA: Diagnosis not present

## 2021-10-07 DIAGNOSIS — I4891 Unspecified atrial fibrillation: Secondary | ICD-10-CM | POA: Diagnosis not present

## 2021-10-07 DIAGNOSIS — G4733 Obstructive sleep apnea (adult) (pediatric): Secondary | ICD-10-CM | POA: Insufficient documentation

## 2021-10-07 DIAGNOSIS — Z6832 Body mass index (BMI) 32.0-32.9, adult: Secondary | ICD-10-CM | POA: Diagnosis not present

## 2021-10-07 DIAGNOSIS — D6869 Other thrombophilia: Secondary | ICD-10-CM

## 2021-10-07 DIAGNOSIS — I4819 Other persistent atrial fibrillation: Secondary | ICD-10-CM

## 2021-10-07 DIAGNOSIS — Z7901 Long term (current) use of anticoagulants: Secondary | ICD-10-CM | POA: Diagnosis not present

## 2021-10-07 DIAGNOSIS — Z87891 Personal history of nicotine dependence: Secondary | ICD-10-CM | POA: Insufficient documentation

## 2021-10-07 NOTE — Progress Notes (Signed)
Primary Care Physician: Martinique, Betty G, MD Referring Physician: [facility] f/u    Glenda [name] is a 71 y.o. female with a h/o new onset afib that is in the afib clinic for f/u hosptial stay 6/13. She had neck surgery at that time. Afib was noted on pre op EKG and also at time of echo in April that was done for pre op clearance, although pt was not notified. While  in the hospital for neck surgery, afib was present and she was consulted by cardiology. She was started on BB for rate control and with CHA2DS2VASc score of 6, she was started on eliquis 72 hours after surgery. She was not aware of her afib.   In the office today, she remains in afib rate controlled. She reports that she has quit smoking. She drinks 2 alcoholic drinks a week. Husband reports that she has had snoring and apnea in the past but he does not note apnea now although she still has some snoring. She does not exercise.   F/u afib clinic, 6/30, remains in a fib at 86 bpm. She will be able to have cardioversion after 7/8 to allow for adequate anticoagulation. She has not missed any anticoagulation since start of drug, 6/17. She is tolerating well.   F/u in afib clinic 12/04/20. Unfortunately, cardioversion did not convert pt despite multiple shocks prior with manual compression. She is here to discuss options. She has developed a rash of her neck, arms torso that was there to a small degree prior to CV but has gotten worse since. Benadryl has not helped with the itching. Received propofol with CV and  in the past  has not shown to be allergic.   F/u in afib clinic, 01/16/21. She is in rate controlled afib today. She feels she may be going in and out. She  feels the BB is making her fatigued and teary at times. Discussed switching to cardizem daily. She brings in BP readings for the last several weeks. Overall BP looks well controlled. She would like to see what the monitor shows before she firmly makes her mind to pursue antiarrythmic's.    F/u in afib clinic, 02/18/21. She remains in rate controlled afib. She is not symptomatic. Recent monitor did show 100% afib burden as pt thought she may be going in and out. She is pending another echo 10/20.    F/u in afib clinic, 03/25/21, after starting on flecainide with echo showing normal EF and with a low risk stress test. She is on 50 mg bid and will increase to 100 mg bid and I will see back in one week to discuss cardioversion. BP elevated here but BP at home controlled.   F/u in afib clinic 04/22/21. She had a successful cardioversion 11/29. Dr. Debara Pickett reduced PM metoprolol as she had bradycardia on return to SR. EKG today shows afib rate controlled. She does not feel she was in SR long enough to appreciate if she felt better. I discussed seeing EP to discuss ablation and she is in agreement. Last echo showed normal left atrial size.   F/u in afib clinic, 10/07/21, one month after ablation. She is in SR today with low amplitude p waves. She has noted some early beats, but no afib. She is also back to her baseline activities.  No swallowing or groin issues.   Today, she denies symptoms of palpitations, chest pain, shortness of breath, orthopnea, PND, lower extremity edema, dizziness, presyncope, syncope, or neurologic sequela. The patient  is tolerating medications without difficulties and is otherwise without complaint today.   Past Medical History:  Diagnosis Date   Allergic rhinitis    Allergy    Anxiety    Arthritis    Cataract    Heart murmur    History of colonic polyps    History of PSVT (paroxysmal supraventricular tachycardia)    Hypertension    Impaired glucose tolerance    Obesity    PAT (paroxysmal atrial tachycardia) ([facility])    Pre-diabetes    hx, not currently being monitor for diabetes, diet controlled   Stroke ([place]) 08/2020   Scan revealed stroke in history   Tobacco user    Vertigo    started 3 weeks ago- after fluid trapped in ear   Past Surgical History:   Procedure Laterality Date   ankle sx-left fx     ANTERIOR CERVICAL DECOMP/DISCECTOMY FUSION N/A 10/28/2020   Procedure: Anterior Cervical Decompression Fusion - Cervical Four-Cervical Five - Cervical Five-Cervical Six - Cervical Six- Cervical Seven;  Surgeon: Kary Kos, MD;  Location: [place];  Service: Neurosurgery;  Laterality: N/A;  Anterior Cervical Decompression Fusion - Cervical Four-Cervical Five - Cervical Five-Cervical Six - Cervical Six- Cervical Seven   ATRIAL FIBRILLATION ABLATION N/A 09/05/2021   Procedure: ATRIAL FIBRILLATION ABLATION;  Surgeon: Constance Haw, MD;  Location: [place] CV LAB;  Service: Cardiovascular;  Laterality: N/A;   CARDIOVERSION N/A 11/28/2020   Procedure: CARDIOVERSION;  Surgeon: Josue Hector, MD;  Location: [facility] ENDOSCOPY;  Service: Cardiovascular;  Laterality: N/A;   CARDIOVERSION N/A 04/15/2021   Procedure: CARDIOVERSION;  Surgeon: [name], MD;  Location: [place];  Service: Cardiovascular;  Laterality: N/A;   CATARACT EXTRACTION     both eyes   CHOLECYSTECTOMY N/A 01/11/2018   Procedure: LAPAROSCOPIC CHOLECYSTECTOMY WITH INTRAOPERATIVE CHOLANGIOGRAM ERAS PATHWAY;  Surgeon: Alphonsa Overall, MD;  Location: [place];  Service: General;  Laterality: N/A;   COLONOSCOPY  04/23/2005   DIAGNOSTIC LAPAROSCOPY     EYE SURGERY     foot sx     with the ankle surgery   POLYPECTOMY      Current Outpatient Medications  Medication Sig Dispense Refill   acetaminophen (TYLENOL) 500 MG tablet Take 1,000 mg by mouth 2 (two) times daily as needed for moderate pain.     ALPRAZolam (XANAX) 0.25 MG tablet TAKE 1 TABLET BY MOUTH DAILY AS NEEDED FOR ANXIETY 30 tablet 1   apixaban (ELIQUIS) 5 MG TABS tablet Take 1 tablet (5 mg total) by mouth 2 (two) times daily. First dose 11/01/20. 180 tablet 3   diltiazem (CARDIZEM CD) 120 MG 24 hr capsule Take 1 capsule (120 mg total) by mouth daily. 30 capsule 11   diphenhydramine-acetaminophen (TYLENOL PM) 25-500 MG  TABS tablet Take 1 tablet by mouth at bedtime as needed (sleep).     flecainide (TAMBOCOR) 100 MG tablet TAKE 1 TABLET BY MOUTH TWICE A DAY 180 tablet 1   metoprolol succinate (TOPROL XL) 25 MG 24 hr tablet Take 1 tablet (25 mg total) by mouth at bedtime.     PARoxetine (PAXIL) 40 MG tablet Take 1 tablet (40 mg total) by mouth daily. 90 tablet 2   rosuvastatin (CRESTOR) 10 MG tablet TAKE 1 TABLET BY MOUTH EVERY DAY 90 tablet 1   No current facility-administered medications for this encounter.    Allergies  Allergen Reactions   Amoxicillin Rash and Other (See Comments)    Has patient had a PCN reaction causing immediate rash,  facial/tongue/throat swelling, SOB or lightheadedness with hypotension: No HAS PT DEVELOPED SEVERE RASH INVOLVING MUCUS MEMBRANES or SKIN NECROSIS: #  #  YES  #  #  Has patient had a PCN reaction that required hospitalization: No Has patient had a PCN reaction occurring within the last 10 years: No    Erythromycin Other (See Comments)    Upset stomach    Social History   Socioeconomic History   Marital status: Married    Spouse name: Not on file   Number of children: 2   Years of education: college   Highest education level: Not on file  Occupational History   Occupation: Retired  Tobacco Use   Smoking status: Former    Packs/day: 0.50    Types: Cigarettes    Quit date: 10/17/2020    Years since quitting: 0.9   Smokeless tobacco: Never  Vaping Use   Vaping Use: Never used  Substance and Sexual Activity   Alcohol use: Yes    Alcohol/week: 2.0 standard drinks    Types: 2 Standard drinks or equivalent per week    Comment: weekends   Drug use: No   Sexual activity: Yes  Other Topics Concern   Not on file  Social History Narrative   Lives at home with her husband.   Right-handed.   One cup caffeine per day.   Social Determinants of Health   Financial Resource Strain: Not on file  Food Insecurity: Not on file  Transportation Needs: Not on file   Physical Activity: Not on file  Stress: Not on file  Social Connections: Not on file  Intimate Partner Violence: Not on file    Family History  Problem Relation Age of Onset   Healthy Mother    Dementia Father    Healthy Sister    Healthy Sister    Alzheimer's disease Other    Colon cancer Neg Hx    Esophageal cancer Neg Hx    Rectal cancer Neg Hx    Stomach cancer Neg Hx    Colon polyps Neg Hx     ROS- All systems are reviewed and negative except as per the HPI above  Physical Exam: Vitals:   10/07/21 1126  BP: (!) 144/64  Pulse: (!) 50  Weight: 104.1 kg  Height: '5\' 7"'$  (1.702 m)    Wt Readings from Last 3 Encounters:  10/07/21 104.1 kg  09/05/21 99.8 kg  07/23/21 99.8 kg    Labs: Lab Results  Component Value Date   NA 142 08/22/2021   K 4.7 08/22/2021   CL 104 08/22/2021   CO2 26 08/22/2021   GLUCOSE 136 (H) 08/22/2021   BUN 17 08/22/2021   CREATININE 0.79 08/22/2021   CALCIUM 9.2 08/22/2021   MG 1.9 10/28/2020   No results found for: INR Lab Results  Component Value Date   CHOL 170 02/18/2021   HDL 39.20 02/18/2021   LDLCALC 112 (H) 02/18/2021   TRIG 96.0 02/18/2021     GEN- The patient is well appearing, alert and oriented x 3 today.   Head- normocephalic, atraumatic Eyes-  Sclera clear, conjunctiva pink Ears- hearing intact Oropharynx- clear Neck- supple, no JVP Lymph- no cervical lymphadenopathy Lungs- Clear to ausculation bilaterally, normal work of breathing Heart- regular rate and rhythm, no murmurs, rubs or gallops, PMI not laterally displaced GI- soft, NT, ND, + BS Extremities- no clubbing, cyanosis, or edema MS- no significant deformity or atrophy Skin- no rash or lesion Psych- euthymic mood, full affect Neuro-  strength and sensation are intact  EKG-  sinus brady at 50 bpm, very small p waves, qrs 102 ms, qt 404 ms   Echo- 03/06/21- 1. Left ventricular ejection fraction, by estimation, is 60 to 65%. The  left ventricle has  normal function. The left ventricle has no regional  wall motion abnormalities. Left ventricular diastolic parameters are  indeterminate.   2. Right ventricular systolic function is normal. The right ventricular  size is normal. There is normal pulmonary artery systolic pressure.   3. The mitral valve is normal in structure. No evidence of mitral valve  regurgitation. No evidence of mitral stenosis.   4. The aortic valve was not well visualized. Aortic valve regurgitation  is not visualized.   5. The inferior vena cava is normal in size with greater than 50%  respiratory variability, suggesting right atrial pressure of 3 mmHg.   Lexi Myoview- 03/18/21-   The study is normal. The study is low risk.   No ST deviation was noted.   Left ventricular function is normal. Nuclear stress EF: 72 %. The left ventricular ejection fraction is hyperdynamic (>65%). End diastolic cavity size is normal.   Prior study not available for comparison.   Findings: Negative for stress induced arrhythmias. No evidence of ischemia or infarction.   Conclusions: Stress test is negative for ischemia or infarct  Assessment and Plan:  1. New onset afib, first dx  spring of 2022 Rate controlled  Started on flecainide 100 mg bid after low risk stress test.  Successful cardioversion but ERAF Referred to EP to discuss ablation She is now one month s/p ablation and is in SR  Continue on flecainide 100 mg bid  Continue Cardizem 120 mg daily and toprol 25 mg daily   2. CHA2DS2VASc  score of 6 ( female, age,  CVA(2), carotid disease)  Continue eliquis 5 mg bid, knows not to interrupt drug during recovery period  3. BMI of 32.95  Weight loss and regular exercise encouraged Has stopped smoking  Avoid alcohol  Snoring and daytime somnolence, will refer for sleep study    4. OSA Has recently started cpap Feels better   F/u with Dr. Baird Kay in [place] as scheduled   Geroge Baseman. Jammie Clink, [facility] [address] [place], [place] [address] [phone]

## 2021-10-15 ENCOUNTER — Encounter (HOSPITAL_BASED_OUTPATIENT_CLINIC_OR_DEPARTMENT_OTHER): Payer: Self-pay

## 2021-10-15 NOTE — Telephone Encounter (Signed)
Please advise 

## 2021-10-17 NOTE — Telephone Encounter (Signed)
Forwarding at patient request

## 2021-11-09 ENCOUNTER — Other Ambulatory Visit: Payer: Self-pay | Admitting: Family Medicine

## 2021-11-15 ENCOUNTER — Other Ambulatory Visit (HOSPITAL_COMMUNITY): Payer: Self-pay | Admitting: Family Medicine

## 2021-11-22 ENCOUNTER — Other Ambulatory Visit: Payer: Self-pay | Admitting: Family Medicine

## 2021-12-08 ENCOUNTER — Ambulatory Visit: Payer: Medicare PPO | Admitting: Cardiology

## 2021-12-08 ENCOUNTER — Encounter: Payer: Self-pay | Admitting: Cardiology

## 2021-12-08 VITALS — BP 136/82 | HR 63 | Ht 67.0 in | Wt 234.0 lb

## 2021-12-08 DIAGNOSIS — D6869 Other thrombophilia: Secondary | ICD-10-CM

## 2021-12-08 DIAGNOSIS — I4819 Other persistent atrial fibrillation: Secondary | ICD-10-CM | POA: Diagnosis not present

## 2021-12-08 NOTE — Patient Instructions (Signed)
Medication Instructions:  Your physician has recommended you make the following change in your medication:  STOP Flecainide STOP Diltiazem  *If you need a refill on your cardiac medications before your next appointment, please call your pharmacy*   Lab Work: None ordered   Testing/Procedures: None ordered   Follow-Up: At [facility], you and your health needs are our priority.  As part of our continuing mission to provide you with exceptional heart care, we have created designated Provider Care Teams.  These Care Teams include your primary Cardiologist (physician) and Advanced Practice Providers (APPs -  Physician Assistants and Nurse Practitioners) who all work together to provide you with the care you need, when you need it.  Your next appointment:   6 month(s)  The format for your next appointment:   In Person  Provider:   You will follow up in the [facility] located at [facility]. Your provider will be: Roderic Palau, NP or Clint R. Fenton, PA-C    Thank you for choosing [facility]!!   Trinidad Curet, RN [phone]  Other Instructions   Important Information About Sugar

## 2021-12-08 NOTE — Progress Notes (Signed)
Electrophysiology Office Note   Date:  12/08/2021   ID:  Glenda [name], DOB [birth year], MRN [id number]  PCP:  [place], Betty G, MD  Cardiologist:   Primary Electrophysiologist:  Loucinda Croy Meredith Leeds, MD    Chief Complaint: AF   History of Present Illness: Glenda [name] is a 71 y.o. female who is being seen today for the evaluation of AF at the request of [place], Malka So, MD. Presenting today for electrophysiology evaluation.  She has a history significant for hypertension, prediabetes, stroke, persistent atrial fibrillation.  Atrial fibrillation was discovered in 2013.  She has had multiple cardioversions.  She is on flecainide.  She is status post atrial fibrillation ablation 09/05/2021.  Today, denies symptoms of palpitations, chest pain, shortness of breath, orthopnea, PND, lower extremity edema, claudication, dizziness, presyncope, syncope, bleeding, or neurologic sequela. The patient is tolerating medications without difficulties.  Since being seen she has done well.  She is noted no further episodes of atrial fibrillation.  She is overall quite happy with her control.  She is able to all her daily activities without restriction.  She has much less fatigue, weakness, shortness of breath.  Past Medical History:  Diagnosis Date   Allergic rhinitis    Allergy    Anxiety    Arthritis    Cataract    Heart murmur    History of colonic polyps    History of PSVT (paroxysmal supraventricular tachycardia)    Hypertension    Impaired glucose tolerance    Obesity    PAT (paroxysmal atrial tachycardia) (HCC)    Pre-diabetes    hx, not currently being monitor for diabetes, diet controlled   Stroke ([facility]) 08/2020   Scan revealed stroke in history   Tobacco user    Vertigo    started 3 weeks ago- after fluid trapped in ear   Past Surgical History:  Procedure Laterality Date   ankle sx-left fx     ANTERIOR CERVICAL DECOMP/DISCECTOMY FUSION N/A 10/28/2020   Procedure: Anterior Cervical  Decompression Fusion - Cervical Four-Cervical Five - Cervical Five-Cervical Six - Cervical Six- Cervical Seven;  Surgeon: Kary Kos, MD;  Location: [facility];  Service: Neurosurgery;  Laterality: N/A;  Anterior Cervical Decompression Fusion - Cervical Four-Cervical Five - Cervical Five-Cervical Six - Cervical Six- Cervical Seven   ATRIAL FIBRILLATION ABLATION N/A 09/05/2021   Procedure: ATRIAL FIBRILLATION ABLATION;  Surgeon: Constance Haw, MD;  Location: [facility];  Service: Cardiovascular;  Laterality: N/A;   CARDIOVERSION N/A 11/28/2020   Procedure: CARDIOVERSION;  Surgeon: Josue Hector, MD;  Location: [facility];  Service: Cardiovascular;  Laterality: N/A;   CARDIOVERSION N/A 04/15/2021   Procedure: CARDIOVERSION;  Surgeon: [name], MD;  Location: [facility];  Service: Cardiovascular;  Laterality: N/A;   CATARACT EXTRACTION     both eyes   CHOLECYSTECTOMY N/A 01/11/2018   Procedure: LAPAROSCOPIC CHOLECYSTECTOMY WITH INTRAOPERATIVE CHOLANGIOGRAM ERAS PATHWAY;  Surgeon: Alphonsa Overall, MD;  Location: [facility];  Service: General;  Laterality: N/A;   COLONOSCOPY  04/23/2005   DIAGNOSTIC LAPAROSCOPY     EYE SURGERY     foot sx     with the ankle surgery   POLYPECTOMY       Current Outpatient Medications  Medication Sig Dispense Refill   acetaminophen (TYLENOL) 500 MG tablet Take 1,000 mg by mouth 2 (two) times daily as needed for moderate pain.     ALPRAZolam (XANAX) 0.25 MG tablet TAKE 1 TABLET BY MOUTH DAILY AS  NEEDED FOR ANXIETY 30 tablet 1   apixaban (ELIQUIS) 5 MG TABS tablet Take 1 tablet (5 mg total) by mouth 2 (two) times daily. First dose 11/01/20. 180 tablet 3   diphenhydramine-acetaminophen (TYLENOL PM) 25-500 MG TABS tablet Take 1 tablet by mouth at bedtime as needed (sleep).     metoprolol succinate (TOPROL XL) 25 MG 24 hr tablet Take 1 tablet (25 mg total) by mouth at bedtime.     PARoxetine (PAXIL) 40 MG tablet TAKE 1 TABLET BY MOUTH EVERY DAY 90 tablet  2   rosuvastatin (CRESTOR) 10 MG tablet TAKE 1 TABLET BY MOUTH EVERY DAY 90 tablet 1   No current facility-administered medications for this visit.    Allergies:   Amoxicillin and Erythromycin   Social History:  The patient  reports that she quit smoking about 13 months ago. Her smoking use included cigarettes. She smoked an average of .5 packs per day. She has never used smokeless tobacco. She reports current alcohol use of about 2.0 standard drinks of alcohol per week. She reports that she does not use drugs.   Family History:  The patient's family history includes Alzheimer's disease in an other family member; Dementia in her father; Healthy in her mother, sister, and sister.   ROS:  Please see the history of present illness.   Otherwise, review of systems is positive for none.   All other systems are reviewed and negative.   PHYSICAL EXAM: VS:  BP 136/82   Pulse 63   Ht '5\' 7"'$  (1.702 m)   Wt 234 lb (106.1 kg)   SpO2 96%   BMI 36.65 kg/m  , BMI Body mass index is 36.65 kg/m. GEN: Well nourished, well developed, in no acute distress  HEENT: normal  Neck: no JVD, carotid bruits, or masses Cardiac: RRR; no murmurs, rubs, or gallops,no edema  Respiratory:  clear to auscultation bilaterally, normal work of breathing GI: soft, nontender, nondistended, + BS MS: no deformity or atrophy  Skin: warm and dry Neuro:  Strength and sensation are intact Psych: euthymic mood, full affect  EKG:  EKG is ordered today. Personal review of the ekg ordered shows sinus rhythm, rate 63  Recent Labs: 08/22/2021: BUN 17; Creatinine, Ser 0.79; Hemoglobin 13.2; Platelets 280; Potassium 4.7; Sodium 142    Lipid Panel     Component Value Date/Time   CHOL 170 02/18/2021 1013   TRIG 96.0 02/18/2021 1013   HDL 39.20 02/18/2021 1013   CHOLHDL 4 02/18/2021 1013   VLDL 19.2 02/18/2021 1013   LDLCALC 112 (H) 02/18/2021 1013   LDLCALC 142 (H) 11/24/2019 1052   LDLDIRECT 170.5 06/06/2010 0811     Wt  Readings from Last 3 Encounters:  12/08/21 234 lb (106.1 kg)  10/07/21 229 lb 6.4 oz (104.1 kg)  09/05/21 220 lb (99.8 kg)      Other studies Reviewed: Additional studies/ records that were reviewed today include: TTE 03/06/21  Review of the above records today demonstrates:   1. Left ventricular ejection fraction, by estimation, is 60 to 65%. The  left ventricle has normal function. The left ventricle has no regional  wall motion abnormalities. Left ventricular diastolic parameters are  indeterminate.   2. Right ventricular systolic function is normal. The right ventricular  size is normal. There is normal pulmonary artery systolic pressure.   3. The mitral valve is normal in structure. No evidence of mitral valve  regurgitation. No evidence of mitral stenosis.   4. The aortic valve was  not well visualized. Aortic valve regurgitation  is not visualized.   5. The inferior vena cava is normal in size with greater than 50%  respiratory variability, suggesting right atrial pressure of 3 mmHg.   Myoview 03/18/2021   The study is normal. The study is low risk.   No ST deviation was noted.   Left ventricular function is normal. Nuclear stress EF: 72 %. The left ventricular ejection fraction is hyperdynamic (>65%). End diastolic cavity size is normal.   Prior study not available for comparison.  Cardiac monitor 02/16/2021 personally reviewed Persistent atrial fibrillation (Burden 100%) Ventricular rates ranging from 34-173 bpm (avg of 72 bpm). No prolonged pauses  ASSESSMENT AND PLAN:  1.  Persistent atrial fibrillation: Currently on flecainide 100 mg twice daily, Eliquis 5 mg twice daily.  CHA2DS2-VASc of 6.  High risk medication monitoring for flecainide.  Is now status post ablation 09/05/2021.  Remained in sinus rhythm.  She is overall quite happy with her control.  Due to that, we [place] stop her flecainide and diltiazem.  2.  Secondary hypercoagulable state: Currently on Eliquis for  atrial fibrillation as above  3.  Obesity: Lifestyle modification encouraged Body mass index is 36.65 kg/m.  Current medicines are reviewed at length with the patient today.   The patient does not have concerns regarding her medicines.  The following changes were made today: Stop diltiazem, flecainide  Labs/ tests ordered today include:  No orders of the defined types were placed in this encounter.    Disposition:   FU 6 months  Signed, Jannelle Notaro Meredith Leeds, MD  12/08/2021 2:32 PM     [facility] [address] [place] [address] [phone] (office) [phone] (fax)

## 2021-12-09 NOTE — Addendum Note (Signed)
Addended by: Michelle Nasuti on: 12/09/2021 09:05 AM   Modules accepted: Orders

## 2021-12-10 ENCOUNTER — Ambulatory Visit: Payer: Medicare PPO | Admitting: Cardiovascular Disease

## 2021-12-10 VITALS — BP 134/70 | HR 63 | Ht 67.0 in | Wt 232.0 lb

## 2021-12-10 DIAGNOSIS — M25473 Effusion, unspecified ankle: Secondary | ICD-10-CM

## 2021-12-10 DIAGNOSIS — G4733 Obstructive sleep apnea (adult) (pediatric): Secondary | ICD-10-CM | POA: Diagnosis not present

## 2021-12-10 DIAGNOSIS — I4819 Other persistent atrial fibrillation: Secondary | ICD-10-CM

## 2021-12-10 DIAGNOSIS — I10 Essential (primary) hypertension: Secondary | ICD-10-CM

## 2021-12-10 DIAGNOSIS — G4736 Sleep related hypoventilation in conditions classified elsewhere: Secondary | ICD-10-CM

## 2021-12-10 MED ORDER — HYDROCHLOROTHIAZIDE 25 MG PO TABS: 12.5000 mg | ORAL_TABLET | Freq: Every day | ORAL | 3 refills | Status: DC

## 2021-12-10 NOTE — Progress Notes (Signed)
Cardiology Office Note    Date:  12/15/2021   ID:  [name], DOB 1950-07-02, MRN [id number]  PCP:  [name], Glenda G, MD  Cardiologist:  Shelva Majestic, MD (sleep);  EP: Dr. Allegra Lai  New Sleep consultation initially referred for sleep evaluation by Roderic Palau, NP  History of Present Illness:  Glenda [name] is a 71 y.o. female who has a history of hypertension, prediabetes, persistent atrial fibrillation and prior stroke.  She is status post several cardioversions and has undergone atrial fibrillation ablation in April 2023.  With her history of atrial fibrillation, she has seen Roderic Palau, NP on several occasions and due to concerns for obstructive sleep apnea was referred for an initial diagnostic polysomnogram on May 27, 2021.  At that time, her Epworth Sleepiness Scale score endorsed at 10 and was consistent with daytime sleepiness and she admitted to snoring.  She was found to have moderate overall sleep apnea with an AHI of 18.0 but her respiratory disturbance index was further increased to 27.8.  She was unable to achieve any REM sleep during that evaluation and oxygen nadir was 81%.  There was moderate snoring noted throughout the study.  She underwent a CPAP titration study on July 23, 2021 and was titrated up to 11 cm of water pressure with AHI 0, and O2 nadir 90%.  Of note, REM sleep was absent throughout the study.  Initial trial of CPAP auto therapy with EPR of 3 at 11 to 16 cm of water was recommended particularly since REM sleep was not achieved during the titration evaluation.  Due to supply chain issues, she did not receive her ResMed AirSense 11 AutoSet unit until Sep 22, 2021.  A download was obtained today in the office from May 8 through December 08, 2021.  She is meeting compliance` 95% usage days.  Average use was 6 hours per night.  Her CPAP is set at 11 to 16 cm water pressure and AHI is excellent at 0.4.  Her 95th percentile pressure is 13.9 with maximum average  pressure 14.5 cm of water.  Typically she stays up late and goes to bed usually 1 AM or later.  She often gets up between 730 and 8 AM.  She has experienced some irritation from her ResMed AirFit F20 fullface mask.  There has been also some leaks more recently.  Since initiating CPAP, she is unaware of breakthrough snoring.  Her sleep is more restorative.  She denies residual daytime sleepiness.  An Epworth Sleepiness Scale score was calculated in the office today and this endorsed at 6 shown below arguing against residual daytime sleepiness:  Epworth Sleepiness Scale: Situation   Chance of Dozing/Sleeping (0 = never , 1 = slight chance , 2 = moderate chance , 3 = high chance )   sitting and reading 1   watching TV 1   sitting inactive in a public place 0   being a passenger in a motor vehicle for an hour or more 2   lying down in the afternoon 2   sitting and talking to someone 0   sitting quietly after lunch (no alcohol) 0   while stopped for a few minutes in traffic as the driver 0   Total Score  6    He denies any awareness of bruxism, restless legs, hypnagogic or hypnopompic hallucinations or cataplectic events.  She admits to some daily ankle swelling.  She is on a medical regimen of metoprolol succinate 25 mg  daily.  She is on Eliquis for anticoagulation and on rosuvastatin 10 mg for hyperlipidemia.  He also is on paroxetine 40 mg daily.  She presents for her initial sleep consultation.   Past Medical History:  Diagnosis Date   Allergic rhinitis    Allergy    Anxiety    Arthritis    Cataract    Heart murmur    History of colonic polyps    History of PSVT (paroxysmal supraventricular tachycardia)    Hypertension    Impaired glucose tolerance    Obesity    PAT (paroxysmal atrial tachycardia) (HCC)    Pre-diabetes    hx, not currently being monitor for diabetes, diet controlled   Stroke ([place]) 08/2020   Scan revealed stroke in history   Tobacco user    Vertigo    started 3  weeks ago- after fluid trapped in ear    Past Surgical History:  Procedure Laterality Date   ankle sx-left fx     ANTERIOR CERVICAL DECOMP/DISCECTOMY FUSION N/A 10/28/2020   Procedure: Anterior Cervical Decompression Fusion - Cervical Four-Cervical Five - Cervical Five-Cervical Six - Cervical Six- Cervical Seven;  Surgeon: Kary Kos, MD;  Location: [facility];  Service: Neurosurgery;  Laterality: N/A;  Anterior Cervical Decompression Fusion - Cervical Four-Cervical Five - Cervical Five-Cervical Six - Cervical Six- Cervical Seven   ATRIAL FIBRILLATION ABLATION N/A 09/05/2021   Procedure: ATRIAL FIBRILLATION ABLATION;  Surgeon: Constance Haw, MD;  Location: [facility];  Service: Cardiovascular;  Laterality: N/A;   CARDIOVERSION N/A 11/28/2020   Procedure: CARDIOVERSION;  Surgeon: Josue Hector, MD;  Location: [facility];  Service: Cardiovascular;  Laterality: N/A;   CARDIOVERSION N/A 04/15/2021   Procedure: CARDIOVERSION;  Surgeon: [name], MD;  Location: [facility];  Service: Cardiovascular;  Laterality: N/A;   CATARACT EXTRACTION     both eyes   CHOLECYSTECTOMY N/A 01/11/2018   Procedure: LAPAROSCOPIC CHOLECYSTECTOMY WITH INTRAOPERATIVE CHOLANGIOGRAM ERAS PATHWAY;  Surgeon: Alphonsa Overall, MD;  Location: [facility];  Service: General;  Laterality: N/A;   COLONOSCOPY  04/23/2005   DIAGNOSTIC LAPAROSCOPY     EYE SURGERY     foot sx     with the ankle surgery   POLYPECTOMY      Current Medications: Outpatient Medications Prior to Visit  Medication Sig Dispense Refill   acetaminophen (TYLENOL) 500 MG tablet Take 1,000 mg by mouth 2 (two) times daily as needed for moderate pain.     ALPRAZolam (XANAX) 0.25 MG tablet TAKE 1 TABLET BY MOUTH DAILY AS NEEDED FOR ANXIETY 30 tablet 1   apixaban (ELIQUIS) 5 MG TABS tablet Take 1 tablet (5 mg total) by mouth 2 (two) times daily. First dose 11/01/20. 180 tablet 3   diphenhydramine-acetaminophen (TYLENOL PM) 25-500 MG TABS tablet  Take 1 tablet by mouth at bedtime as needed (sleep).     metoprolol succinate (TOPROL XL) 25 MG 24 hr tablet Take 1 tablet (25 mg total) by mouth at bedtime.     PARoxetine (PAXIL) 40 MG tablet TAKE 1 TABLET BY MOUTH EVERY DAY 90 tablet 2   rosuvastatin (CRESTOR) 10 MG tablet TAKE 1 TABLET BY MOUTH EVERY DAY 90 tablet 1   No facility-administered medications prior to visit.     Allergies:   Amoxicillin and Erythromycin   Social History   Socioeconomic History   Marital status: Married    Spouse name: Not on file   Number of children: 2   Years of education: college  Highest education level: Not on file  Occupational History   Occupation: Retired  Tobacco Use   Smoking status: Former    Packs/day: 0.50    Types: Cigarettes    Quit date: 10/17/2020    Years since quitting: 1.1   Smokeless tobacco: Never  Vaping Use   Vaping Use: Never used  Substance and Sexual Activity   Alcohol use: Yes    Alcohol/week: 2.0 standard drinks of alcohol    Types: 2 Standard drinks or equivalent per week    Comment: weekends   Drug use: No   Sexual activity: Yes  Other Topics Concern   Not on file  Social History Narrative   Lives at home with her husband.   Right-handed.   One cup caffeine per day.   Social Determinants of Health   Financial Resource Strain: Not on file  Food Insecurity: Not on file  Transportation Needs: Not on file  Physical Activity: Not on file  Stress: Not on file  Social Connections: Not on file    Socially, he was born in [place].  She is married for 48 years.  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  She has 2 children and 5 grandchildren.   Family History:  The patient's family history includes Alzheimer's disease in an other family member; Dementia in her father; Healthy in her mother, sister, and sister.  Both parents are deceased.  Father died at age 48 with Alzheimer's disease.   Mother died at age 63.  She has 2 sisters and 1 brother died at 85.  ROS General: Negative; No fevers, chills, or night sweats; obesity HEENT: Negative; No changes in vision or hearing, sinus congestion, difficulty swallowing Pulmonary: Negative; No cough, wheezing, shortness of breath, hemoptysis Cardiovascular: Hypertension, previous persistent atrial fibrillation, status post several cardioversions with ultimate AF ablation. GI: Negative; No nausea, vomiting, diarrhea, or abdominal pain GU: Negative; No dysuria, hematuria, or difficulty voiding Musculoskeletal: Status post cervical spinal fusion by Dr. Saintclair Halsted Hematologic/Oncology: Negative; no easy bruising, bleeding Endocrine: Negative; no heat/cold intolerance; no diabetes Neuro: Negative; no changes in balance, headaches Skin: Negative; No rashes or skin lesions Psychiatric: Negative; No behavioral problems, depression Sleep: Negative; No snoring, daytime sleepiness, hypersomnolence, bruxism, restless legs, hypnogognic hallucinations, no cataplexy Other comprehensive 14 point system review is negative.   PHYSICAL EXAM:   VS:  BP 134/70 (BP Location: Left Arm, Patient Position: Sitting, Cuff Size: Large)   Pulse 63   Ht $R'5\' 7"'rQ$  (1.702 m)   Wt 232 lb (105.2 kg)   BMI 36.34 kg/m     Repeat blood pressure by me was 130/84.  She states her blood pressure at home typically is 110/58  Wt Readings from Last 3 Encounters:  12/10/21 232 lb (105.2 kg)  12/08/21 234 lb (106.1 kg)  10/07/21 229 lb 6.4 oz (104.1 kg)    General: Alert, oriented, no distress.  Skin: normal turgor, no rashes, warm and dry HEENT: Normocephalic, atraumatic. Pupils equal round and reactive to light; sclera anicteric; extraocular muscles intact;  Nose without nasal septal hypertrophy Mouth/Parynx benign; Mallinpatti scale 3 Neck: No JVD, no carotid bruits; normal carotid upstroke Lungs: clear to ausculatation and percussion; no wheezing or rales Chest wall:  without tenderness to palpitation Heart: PMI not displaced, RRR, s1 s2 normal, 1/6 systolic murmur, no diastolic murmur, no rubs, gallops, thrills, or heaves Abdomen: soft, nontender; no hepatosplenomehaly, BS+; abdominal aorta nontender and  not dilated by palpation. Back: no CVA tenderness Pulses 2+ Musculoskeletal: full range of motion, normal strength, no joint deformities Extremities: Trace bilateral ankle swelling.  No clubbing cyanosis, Homan's sign negative  Neurologic: grossly nonfocal; Cranial nerves grossly wnl Psychologic: Normal mood and affect   Studies/Labs Reviewed:   December 10, 2021 ECG (independently read by me): Sinus rhythm at 63, baseline wander  Recent Labs:    Latest Ref Rng & Units 08/22/2021    8:06 AM 04/01/2021    4:00 PM 02/18/2021   10:13 AM  BMP  Glucose 70 - 99 mg/dL 136  85  121   BUN 8 - 27 mg/dL _0 Creatinine 0.57 - 1.00 mg/dL 0.79  0.74  0.72   BUN/Creat Ratio 12 - 28 22     Sodium 134 - 144 mmol/L 142  136  138   Potassium 3.5 - 5.2 mmol/L 4.7  4.1  5.1   Chloride 96 - 106 mmol/L 104  99  101   CO2 20 - 29 mmol/L 26  28  32   Calcium 8.7 - 10.3 mg/dL 9.2  9.2  9.9         Latest Ref Rng & Units 06/16/2020    5:37 PM 11/24/2019   10:52 AM 06/07/2017   10:01 AM  Hepatic Function  Total Protein 6.5 - 8.1 g/dL 7.6  6.8  6.1   Albumin 3.5 - 5.0 g/dL 4.0   3.2   AST 15 - 41 U/L _1 ALT 0 - 44 U/L _2 Alk Phosphatase 38 - 126 U/L 75   93   Total Bilirubin 0.3 - 1.2 mg/dL 0.7  1.1  0.6        Latest Ref Rng & Units 08/22/2021    8:06 AM 04/01/2021    4:00 PM 11/28/2020    8:02 AM  CBC  WBC 3.4 - 10.8 x10E3/uL 7.6  9.8    Hemoglobin 11.1 - 15.9 g/dL 13.2  13.3  16.0   Hematocrit 34.0 - 46.6 % 39.7  41.5  47.0   Platelets 150 - 450 x10E3/uL 280  309     Lab Results  Component Value Date   MCV 94 08/22/2021   MCV 98.3 04/01/2021   MCV 96.6 11/14/2020   Lab Results  Component Value Date   TSH 0.607 10/28/2020    Lab Results  Component Value Date   HGBA1C 6.4 02/18/2021     BNP No results found for: "BNP"  ProBNP No results found for: "PROBNP"   Lipid Panel     Component Value Date/Time   CHOL 170 02/18/2021 1013   TRIG 96.0 02/18/2021 1013   HDL 39.20 02/18/2021 1013   CHOLHDL 4 02/18/2021 1013   VLDL 19.2 02/18/2021 1013   LDLCALC 112 (H) 02/18/2021 1013   LDLCALC 142 (H) 11/24/2019 1052   LDLDIRECT 170.5 06/06/2010 0811     RADIOLOGY: No results found.   Additional studies/ records that were reviewed today include:     Patient Name: Tammara, Massing Date: 05/27/2021 Gender: Female D.O.B: 11-13-1950 Age (years): 37 Referring Provider: Sherran Needs Height (inches): 23 Interpreting Physician: Shelva Majestic MD, ABSM Weight (lbs): 220 RPSGT: Carolin Coy BMI: 33 MRN: [id number] Neck Size: 16.00   CLINICAL INFORMATION Sleep Study Type: NPSG   Indication for sleep study: Fatigue, Hypertension, Obesity, Snoring, atrial fibrillation   Epworth Sleepiness Score: 10  SLEEP STUDY TECHNIQUE As per the AASM Manual for the Scoring of Sleep and Associated Events v2.3 (April 2016) with a hypopnea requiring 4% desaturations.   The channels recorded and monitored were frontal, central and occipital EEG, electrooculogram (EOG), submentalis EMG (chin), nasal and oral airflow, thoracic and abdominal wall motion, anterior tibialis EMG, snore microphone, electrocardiogram, and pulse oximetry.   MEDICATIONS acetaminophen (TYLENOL) 500 MG tablet ALPRAZolam (XANAX) 0.25 MG tablet apixaban (ELIQUIS) 5 MG TABS tablet diltiazem (CARDIZEM CD) 120 MG 24 hr capsule flecainide (TAMBOCOR) 100 MG tablet metoprolol succinate (TOPROL XL) 25 MG 24 hr tablet Multiple Vitamins-Minerals (MULTIVITAMIN WITH MINERALS) tablet PRoxetine (PAXIL) 40 MG tablet Polyvinyl Alcohol-Povidone PF (REFRESH) 1.4-0.6 % SOLN rosuvastatin (CRESTOR) 10 MG tablet Medications self-administered by patient  taken the night of the study : rosuvastatin, METOPROLOL SUCCINATE, FLECAINIDE ACETATE, XANAX, PAROXETINE, DILTIAZEM HCL, apixaban   SLEEP ARCHITECTURE The study was initiated at 10:56:10 PM and ended at 4:58:10 AM.   Sleep onset time was 13.9 minutes and the sleep efficiency was 48.9%. The total sleep time was 177 minutes.   Stage REM latency was N/A minutes.   The patient spent 58.47% of the night in stage N1 sleep, 41.53% in stage N2 sleep, 0.00% in stage N3 and 0% in REM.   Alpha intrusion was absent.   Supine sleep was 4.80%.   RESPIRATORY PARAMETERS The overall apnea/hypopnea index (AHI) was 18.0 per hour. The respiratory disturbance index (RDI) was 27.8/h. There were 41 total apneas, including 41 obstructive, 0 central and 0 mixed apneas. There were 12 hypopneas and 29 RERAs.   The AHI during Stage REM sleep was N/A per hour.   AHI while supine was 0.0 per hour.   The mean oxygen saturation was 92.35%. The minimum SpO2 during sleep was 81.00%.   Moderate snoring was noted during this study.   CARDIAC DATA The 2 lead EKG demonstrated sinus rhythm. The mean heart rate was 58.15 beats per minute. Other EKG findings include: Atrial Fibrillation.   LEG MOVEMENT DATA The total PLMS were 0 with a resulting PLMS index of 0.00. Associated arousal with leg movement index was 0.0 .   IMPRESSIONS - Moderate obstructive sleep apnea occurred during this study (AHI 18.0/h; RDI 27.8/h). The overall severity may be underestimated since REM sleep was not achieved on this study.  - Moderate oxygen desaturation to a nadir of 81%. - The patient snored with moderate snoring volume. - EKG findings include Atrial Fibrillation. - Clinically significant periodic limb movements did not occur during sleep. No significant associated arousals.   DIAGNOSIS - Obstructive Sleep Apnea (G47.33) - Nocturnal Hypoxemia (G47.36)   RECOMMENDATIONS - In this patient with significant cardiovascular  co-morbidities recommend a therapeutic in-lab CPAP titration to determine optimal pressure required to alleviate sleep disordered breathing. If unable to have an in-lab titration initiate Auto-PAP with EPR of 3 at 7 - 18 cm of water.  - Effort should be made to optimize nasal and oropharyngeal patency. - Avoid alcohol, sedatives and other CNS depressants that may worsen sleep apnea and disrupt normal sleep architecture. - Sleep hygiene should be reviewed to assess factors that may improve sleep quality. - Weight management (BMI 33) and regular exercise should be initiated or continued if appropriate.    07/23/2021 CLINICAL INFORMATION The patient is referred for a CPAP titration to treat sleep apnea.   Date of NPSG:  05/27/2021: AHI 18/h, RDI 27.8/h; with absent REM sleep, O2 nadir 81%   SLEEP STUDY TECHNIQUE As per  the AASM Manual for the Scoring of Sleep and Associated Events v2.3 (April 2016) with a hypopnea requiring 4% desaturations.   The channels recorded and monitored were frontal, central and occipital EEG, electrooculogram (EOG), submentalis EMG (chin), nasal and oral airflow, thoracic and abdominal wall motion, anterior tibialis EMG, snore microphone, electrocardiogram, and pulse oximetry. Continuous positive airway pressure (CPAP) was initiated at the beginning of the study and titrated to treat sleep-disordered breathing.   MEDICATIONS acetaminophen (TYLENOL) 500 MG tablet ALPRAZolam (XANAX) 0.25 MG tablet apixaban (ELIQUIS) 5 MG TABS tablet diltiazem (CARDIZEM CD) 120 MG 24 hr capsule flecainide (TAMBOCOR) 100 MG tablet metoprolol succinate (TOPROL XL) 25 MG 24 hr tablet Multiple Vitamins-Minerals (MULTIVITAMIN WITH MINERALS) tablet PARoxetine (PAXIL) 40 MG tablet Polyvinyl Alcohol-Povidone PF (REFRESH) 1.4-0.6 % SOLN rosuvastatin (CRESTOR) 10 MG tablet Medications self-administered by patient taken the night of the study : Eliquis, FLECAINIDE ACETATE, METOPROLOL  SUCCINATE   TECHNICIAN COMMENTS Comments added by technician: Pt went to restroom once. Patient was restless all through the night. Comments added by scorer: N/A   RESPIRATORY PARAMETERS Optimal PAP Pressure (cm):  11        AHI at Optimal Pressure (/hr):            0 Overall Minimal O2 (%):         83.0     Supine % at Optimal Pressure (%):    100 Minimal O2 at Optimal Pressure (%): 90.0        SLEEP ARCHITECTURE The study was initiated at 10:18:23 PM and ended at 4:51:48 AM.   Sleep onset time was 3.8 minutes and the sleep efficiency was 77.3%%. The total sleep time was 304 minutes.   The patient spent 7.6%% of the night in stage N1 sleep, 85.5%% in stage N2 sleep, 6.6%% in stage N3 and 0.3% in REM.Stage REM latency was 140.5 minutes   Wake after sleep onset was 85.7. Alpha intrusion was absent. Supine sleep was 60.69%.   CARDIAC DATA The 2 lead EKG demonstrated sinus rhythm. The mean heart rate was 56.0 beats per minute. Other EKG findings include: None.   LEG MOVEMENT DATA The total Periodic Limb Movements of Sleep (PLMS) were 0. The PLMS index was 0.0. A PLMS index of <15 is considered normal in adults.   IMPRESSIONS - CPAP was initiated at 6 cm and was titrated to optimal PAP pressure at 11 cm of water (AHI 0, RDI 0, O2 nadir 90%). REM sleep was absent throughout the study. - Moderate oxygen desaturations to a nadir of 83% at 6 and 10 cm of water. - No snoring was audible during this study. - No cardiac abnormalities were observed during this study. - Clinically significant periodic limb movements were not noted during this study. Arousals associated with PLMs were rare.   DIAGNOSIS - Obstructive Sleep Apnea (G47.33)   RECOMMENDATIONS - Recommend an initial trial of CPAP Auto therapy with EPR of 3 at 11- 16 cm H2O with heated humidification. A Small size Resmed Full Face AirFit F20 mask was used for the titration. - Effort should be made to optimize nasal and oropharyngeal  patency. - Avoid alcohol, sedatives and other CNS depressants that may worsen sleep apnea and disrupt normal sleep architecture. - Sleep hygiene should be reviewed to assess factors that may improve sleep quality. - Weight management (BMI 33) and regular exercise should be initiated or continued. - Recommend a download and sleep clinic evaluation after 4 weeks of therapy    ASSESSMENT:  1. Obstructive sleep apnea (adult) (pediatric)   2. H/O persistent atrial fibrillation [facility]): Status post atrial fibrillation ablation   3. Hypoxemia associated with sleep   4. Essential hypertension   5. Ankle swelling, bilateral     PLAN:  Ms. 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  She has a history of hypertension, prediabetes, remote small stroke, and had issues with persistent atrial fibrillation.  She had undergone several cardioversions.  Ultimately she underwent successful atrial fibrillation ablation on September 05, 2021.  With her cardiovascular comorbidities and recurrent atrial fibrillation she was evaluated for obstructive sleep apnea and was found to have moderate overall sleep apnea with an AHI of 18/h, DI 27.8/h with absent REM sleep on her evaluation and O2 nadir at 81%.  On her CPAP titration study, she also did not achieve rem sleep and was titrated up to 11 cm.  She has been on therapy with Paxil which may be contributory to her absent REM sleep.  Her CPAP set up date was Sep 22, 2021.  I reviewed her downloads obtained since that time.  She is meeting compliance standards.  However, usage is suboptimal with average use at only 6 hours.  She typically goes to bed very late after 1 AM and wakes up around 730 or 8 AM.  On her download, AHI was excellent at 0.4 and her 95th percentile pressure is 13.9 with maximum average pressure at 14.5.  I had a long discussion with her in the office today.  I reviewed normal sleep architecture and the  potential disruptive effects of untreated sleep apnea on sleep architecture.  In addition I discussed its implications with reference to hypertension, increased risk for nocturnal arrhythmias, significantly increased risk for atrial fibrillation with high likelihood of recurrent atrial fibrillation if sleep apnea is untreated despite cardioversion and even ablation therapy.  In addition I discussed its effects on glucose metabolism with increased insulin resistance, increased inflammatory markers, as well as nocturnal GERD.  I also discussed potential effects of nocturnal hypoxemia contributing to nocturnal ischemia both cardiac as well as cerebrovascular.  In addition I discussed the pathophysiology associated with increased nocturia in patients with untreated sleep apnea.  She has had issues with her fullface mask.  In the office today I provided her with a sample of a ResMed AirFit N30i which I believe she will do much better with (Lot [id number]).  She has had irritation with her previous F20 fullface mask.  She has bilateral ankle swelling.  I am giving her a prescription for HCTZ 12.5 mg to take as needed.  I answered all her questions.  From a sleep perspective I will see her in 1 year for reevaluation or sooner as needed.   Medication Adjustments/Labs and Tests Ordered: Current medicines are reviewed at length with the patient today.  Concerns regarding medicines are outlined above.  Medication changes, Labs and Tests ordered today are listed in the Patient Instructions below. Patient Instructions  Medication Instructions:  START HCTZ 12.5 mg daily   *If you need a refill on your cardiac medications before your next appointment, please call your pharmacy*   Follow-Up: At [facility], you and your health needs are our priority.  As part of our continuing mission to provide you with exceptional heart care, we have created designated Provider Care Teams.  These Care Teams include your primary  Cardiologist (physician) and Advanced Practice Providers (APPs -  Physician Assistants and  Nurse Practitioners) who all work together to provide you with the care you need, when you need it.  We recommend signing up for the patient portal called "MyChart".  Sign up information is provided on this After Visit Summary.  MyChart is used to connect with patients for Virtual Visits (Telemedicine).  Patients are able to view lab/test results, encounter notes, upcoming appointments, etc.  Non-urgent messages can be sent to your provider as well.   To learn more about what you can do with MyChart, go to [email or web address].    Your next appointment:   12 month(s)  The format for your next appointment:   In Person  Provider:   Shelva Majestic, MD          Signed, Shelva Majestic, MD, [place], [place] Board of Sleep Medicine  12/15/2021 5:46 PM    [facility] [address] [place], [place], [place]  [address] Phone: [phone]

## 2021-12-10 NOTE — Patient Instructions (Signed)
Medication Instructions:  START HCTZ 12.5 mg daily   *If you need a refill on your cardiac medications before your next appointment, please call your pharmacy*   Follow-Up: At [facility], you and your health needs are our priority.  As part of our continuing mission to provide you with exceptional heart care, we have created designated Provider Care Teams.  These Care Teams include your primary Cardiologist (physician) and Advanced Practice Providers (APPs -  Physician Assistants and Nurse Practitioners) who all work together to provide you with the care you need, when you need it.  We recommend signing up for the patient portal called "MyChart".  Sign up information is provided on this After Visit Summary.  MyChart is used to connect with patients for Virtual Visits (Telemedicine).  Patients are able to view lab/test results, encounter notes, upcoming appointments, etc.  Non-urgent messages can be sent to your provider as well.   To learn more about what you can do with MyChart, go to [email or web address].    Your next appointment:   12 month(s)  The format for your next appointment:   In Person  Provider:   Shelva Majestic, MD

## 2022-01-04 ENCOUNTER — Other Ambulatory Visit (HOSPITAL_COMMUNITY): Payer: Self-pay | Admitting: Nurse Practitioner

## 2022-02-02 ENCOUNTER — Encounter: Payer: Self-pay | Admitting: Family Medicine

## 2022-02-02 DIAGNOSIS — M7660 Achilles tendinitis, unspecified leg: Secondary | ICD-10-CM

## 2022-02-04 ENCOUNTER — Encounter: Payer: Self-pay | Admitting: Sports Medicine

## 2022-02-04 ENCOUNTER — Telehealth: Payer: Self-pay | Admitting: Family Medicine

## 2022-02-04 ENCOUNTER — Ambulatory Visit: Payer: Medicare PPO | Admitting: Sports Medicine

## 2022-02-04 ENCOUNTER — Ambulatory Visit: Payer: Self-pay

## 2022-02-04 VITALS — Ht 67.0 in | Wt 230.0 lb

## 2022-02-04 DIAGNOSIS — M7662 Achilles tendinitis, left leg: Secondary | ICD-10-CM | POA: Diagnosis not present

## 2022-02-04 DIAGNOSIS — M25572 Pain in left ankle and joints of left foot: Secondary | ICD-10-CM | POA: Diagnosis not present

## 2022-02-04 MED ORDER — PREDNISONE 20 MG PO TABS: 20.0000 mg | ORAL_TABLET | Freq: Every day | ORAL | 0 refills | Status: AC

## 2022-02-04 NOTE — Telephone Encounter (Signed)
Left message for patient to call back and schedule Medicare Annual Wellness Visit (AWV) either virtually or in office. Left  my Glenda [name] number [phone]   Last AWV 02/18/21 ; please schedule at anytime with [facility] Nurse Health Advisor 1 or 2

## 2022-02-04 NOTE — Progress Notes (Signed)
Glenda [name] - 71 y.o. female MRN [id number]  Date of birth: [birth year]  Office Visit Note: Visit Date: 02/04/2022 PCP: Martinique, Betty G, MD Referred by: Martinique, Betty G, MD  Subjective: Chief Complaint  Patient presents with   Left [name] Tendon - Pain   HPI: Glenda [name] is a pleasant 71 y.o. female who presents today for acute on subacute left [name] pain.  Pain has been bothering her in the midportion of the left [name] for a few months, however she took a recent beach trip and was walking barefoot on the beach a week or 2 ago and this significantly increased her pain.  She is now hobbling because of the pain and is hesitant to go up onto the toes.  She does note some swelling and warmth to this area.  Denies any pain over the heel bone or in the calf itself.  She does feel like this is making her walk differently as she has now had some mild pain in the knee as well.  She denies any pop or giving out of the leg or [name].  No specific traumatic injury.  Pertinent ROS were reviewed with the patient and found to be negative unless otherwise specified above in HPI.   Assessment & Plan: Visit Diagnoses:  1. [name] tendinitis, left leg   2. Acute left ankle pain    Plan: Discussed and reviewed Glenda [name] pain and her ultrasound findings today.  These do show acute tendinitis with some possible more chronic tendinopathy with a thickened tendon.  Was very reassured that there is no tearing noted of the tendon or bony irregularity.  From the degree of her pain and her swelling seen on exam and on ultrasound, it is best that we shut this down for 2 weeks.  Did place her in a tall cam walker boot with a heel lift.  We will keep her in the cam walker boot for 2 weeks, she may come out only to shower and when not on the feet.  She is unable to take NSAIDs given that she is on Eliquis.  To help calm down the inflammation we will do a low-dose prednisone 20 mg for 6 days.  We will see  her back in 2 weeks, hopefully if this is calm down we will be able to transition her out of the boot into a shoe with a heel lift and begin eccentric heel and calf exercises.  May always consider shockwave therapy or nitroglycerin patch protocol if not improving as expected.  Recommended ice, resting and activity modification.  Follow-up: Return in about 2 weeks (around 02/18/2022) for with Dr. Rolena Infante.   Meds & Orders:  Meds ordered this encounter  Medications   predniSONE (DELTASONE) 20 MG tablet    Sig: Take 1 tablet (20 mg total) by mouth daily with breakfast for 6 days.    Dispense:  6 tablet    Refill:  0    Orders Placed This Encounter  Procedures   [place] Extrem Low Left Ltd     Procedures: No procedures performed      Clinical History: No specialty comments available.  She reports that she quit smoking about 15 months ago. Her smoking use included cigarettes. She smoked an average of .5 packs per day. She has never used smokeless tobacco.  Recent Labs    02/18/21 1013  HGBA1C 6.4    Objective:   Vital Signs: Ht '5\' 7"'$  (1.702 m)   Wt 230  lb (104.3 kg)   BMI 36.02 kg/m   Physical Exam  Gen: Well-appearing, in no acute distress; non-toxic CV: Regular Rate. Well-perfused. Warm.  Resp: Breathing unlabored on room air; no wheezing. Psych: Fluid speech in conversation; appropriate affect; normal thought process Neuro: Sensation intact throughout. No gross coordination deficits.   Ortho Exam -Left leg: There is notable soft tissue swelling over the midportion of the [name] tendon.  I cannot palpate any defect in the tendon from the calcaneus to the proximal calf.  There is no significant redness or erythema.  There is relative restriction in dorsiflexion, resisted plantarflexion does cause pain.  Negative Thompson squeeze test.  Negative calcaneal squeeze test.  Neurovascular intact distally.  She does walk with a antalgic gait with hesitancy going up onto the  toes.  Imaging: [place] Extrem Low Left Ltd  Result Date: 02/04/2022 MS[facility] Limited leg/[name] ultrasound performed, left -Evaluation of the posterior calcaneus shows no significant cortical irregularity.  There is proper insertion of the [name] tendon onto the posterior calcaneus.  Insertional tendon thickness 0.58 cm. No insertional deficits or tearing.  Skin approximately near the midportion of the [name] tendon there is notable thickening of the tendon with hypoechoic fluid within the tendon itself and notable hyperemia.  Tendon thickness 1.14 cm at mid [name], notably thickened and enlarged. Long axis scanning more proximally to the [name] and myotendinous junction of the gastroc and soleus shows no abnormality or thickening of the tendon or musculature at that region.   Acute [name] tendinitis      Past Medical/Family/Surgical/Social History: Medications & Allergies reviewed per EMR, new medications updated. Patient Active Problem List   Diagnosis Date Noted   Persistent atrial fibrillation (HCC)    HNP (herniated nucleus pulposus) with myelopathy, cervical 10/28/2020   Paresthesia 09/26/2020   Cervical myelopathy ([place]) 09/02/2020   Cerebrovascular accident (CVA) ([place]) 09/02/2020   Weakness 08/29/2020   Right cervical radiculopathy 08/29/2020   Hyperlipidemia 11/24/2019   Anxiety disorder, unspecified 11/25/2007   ALLERGIC RHINITIS 11/25/2007   History of colonic polyps 11/25/2007   Class 1 obesity with body mass index (BMI) of 32.0 to 32.9 in adult 11/24/2006   PANIC DISORDER 11/24/2006   Essential hypertension 11/24/2006   PAROXYSMAL SUPRAVENTRICULAR TACHYCARDIA 11/24/2006   Past Medical History:  Diagnosis Date   Allergic rhinitis    Allergy    Anxiety    Arthritis    Cataract    Heart murmur    History of colonic polyps    History of PSVT (paroxysmal supraventricular tachycardia)    Hypertension    Impaired glucose tolerance    Obesity    PAT (paroxysmal  atrial tachycardia) (HCC)    Pre-diabetes    hx, not currently being monitor for diabetes, diet controlled   Stroke ([place]) 08/2020   Scan revealed stroke in history   Tobacco user    Vertigo    started 3 weeks ago- after fluid trapped in ear   Family History  Problem Relation Age of Onset   Healthy Mother    Dementia Father    Healthy Sister    Healthy Sister    Alzheimer's disease Other    Colon cancer Neg Hx    Esophageal cancer Neg Hx    Rectal cancer Neg Hx    Stomach cancer Neg Hx    Colon polyps Neg Hx    Past Surgical History:  Procedure Laterality Date   ankle sx-left fx     ANTERIOR CERVICAL DECOMP/DISCECTOMY FUSION N/A  10/28/2020   Procedure: Anterior Cervical Decompression Fusion - Cervical Four-Cervical Five - Cervical Five-Cervical Six - Cervical Six- Cervical Seven;  Surgeon: Kary Kos, MD;  Location: [place];  Service: Neurosurgery;  Laterality: N/A;  Anterior Cervical Decompression Fusion - Cervical Four-Cervical Five - Cervical Five-Cervical Six - Cervical Six- Cervical Seven   ATRIAL FIBRILLATION ABLATION N/A 09/05/2021   Procedure: ATRIAL FIBRILLATION ABLATION;  Surgeon: Constance Haw, MD;  Location: [place] CV LAB;  Service: Cardiovascular;  Laterality: N/A;   CARDIOVERSION N/A 11/28/2020   Procedure: CARDIOVERSION;  Surgeon: Josue Hector, MD;  Location: [facility] ENDOSCOPY;  Service: Cardiovascular;  Laterality: N/A;   CARDIOVERSION N/A 04/15/2021   Procedure: CARDIOVERSION;  Surgeon: [name], MD;  Location: [place];  Service: Cardiovascular;  Laterality: N/A;   CATARACT EXTRACTION     both eyes   CHOLECYSTECTOMY N/A 01/11/2018   Procedure: LAPAROSCOPIC CHOLECYSTECTOMY WITH INTRAOPERATIVE CHOLANGIOGRAM ERAS PATHWAY;  Surgeon: Alphonsa Overall, MD;  Location: [place];  Service: General;  Laterality: N/A;   COLONOSCOPY  04/23/2005   DIAGNOSTIC LAPAROSCOPY     EYE SURGERY     foot sx     with the ankle surgery   POLYPECTOMY     Social History    Occupational History   Occupation: Retired  Tobacco Use   Smoking status: Former    Packs/day: 0.50    Types: Cigarettes    Quit date: 10/17/2020    Years since quitting: 1.3   Smokeless tobacco: Never  Vaping Use   Vaping Use: Never used  Substance and Sexual Activity   Alcohol use: Yes    Alcohol/week: 2.0 standard drinks of alcohol    Types: 2 Standard drinks or equivalent per week    Comment: weekends   Drug use: No   Sexual activity: Yes

## 2022-02-04 NOTE — Progress Notes (Signed)
Pain for a few months in the left achilles, worse after a beach trip. Does have some swelling. Tylenol for pain PRN. Also complaining of right knee pain. Minimal swelling in the knee, but does complain of stiffness.

## 2022-02-18 ENCOUNTER — Ambulatory Visit (INDEPENDENT_AMBULATORY_CARE_PROVIDER_SITE_OTHER): Payer: Medicare PPO

## 2022-02-18 ENCOUNTER — Ambulatory Visit: Payer: Medicare PPO | Admitting: Sports Medicine

## 2022-02-18 ENCOUNTER — Encounter: Payer: Self-pay | Admitting: Sports Medicine

## 2022-02-18 DIAGNOSIS — M1120 Other chondrocalcinosis, unspecified site: Secondary | ICD-10-CM | POA: Diagnosis not present

## 2022-02-18 DIAGNOSIS — M7662 Achilles tendinitis, left leg: Secondary | ICD-10-CM

## 2022-02-18 DIAGNOSIS — M25561 Pain in right knee: Secondary | ICD-10-CM

## 2022-02-18 MED ORDER — LIDOCAINE HCL 1 % IJ SOLN
2.0000 mL | INTRAMUSCULAR | Status: AC | PRN
  Administered 2022-02-18: 2 mL

## 2022-02-18 MED ORDER — DICLOFENAC SODIUM 1 % EX GEL: 4.0000 g | Freq: Four times a day (QID) | CUTANEOUS | 0 refills | Status: AC

## 2022-02-18 MED ORDER — BUPIVACAINE HCL 0.25 % IJ SOLN
2.0000 mL | INTRAMUSCULAR | Status: AC | PRN
  Administered 2022-02-18: 2 mL via INTRA_ARTICULAR

## 2022-02-18 MED ORDER — METHYLPREDNISOLONE ACETATE 40 MG/ML IJ SUSP
80.0000 mg | INTRAMUSCULAR | Status: AC | PRN
  Administered 2022-02-18: 80 mg via INTRA_ARTICULAR

## 2022-02-18 NOTE — Progress Notes (Signed)
Glenda [name] - 71 y.o. female MRN [id number]  Date of birth: 12/16/1950  Office Visit Note: Visit Date: 02/18/2022 PCP: Martinique, Betty G, MD Referred by: Martinique, Betty G, MD  Subjective: Chief Complaint  Patient presents with   Left Foot - Pain, Follow-up   Right Knee - Pain   HPI: Glenda [name] is a pleasant 71 y.o. female who presents today for follow-up of left achilles pain and newer symptom of acute on chronic right knee pain.  Left Achilles - she has been adherent to wearing the walking boot.  She did complete the prednisone 20 mg for 6 days.  She states the left Achilles is feeling much improved.  Still has some tightness in the midportion, but significantly improved from last visit.  She denies any new injury.  Right knee -patient states she has had some issues with the right knee even before her last visit, however feels like this has been exacerbated as she has been walking differently because of wearing the walking boot on the left foot.  The knee is painful over the medial side of the knee.  Occasional have some swelling.  She has been icing this and resting and as much as possible.  She is unable to take NSAIDs given her Eliquis use.  She denies any prior surgery or injection therapy to this knee in the past.  No giving out or locking of the knee.  Pertinent ROS were reviewed with the patient and found to be negative unless otherwise specified above in HPI.   Assessment & Plan: Visit Diagnoses:  1. Acute pain of right knee   2. Achilles tendinitis, left leg   3. Chondrocalcinosis    Plan: Vidalia's left Achilles has significantly improved after her brief 2 weeks of shutdown.  We will let her transition out of the walking boot into regular tennis shoes today.  Did discuss it is imperative she begins her home exercise therapy for the calfs and Achilles with eccentric stretching and loading.  My athletic trainer did review home rehab for her which she will perform once daily.  Did  recommend she may use some topical Voltaren gel over the midportion of the Achilles to help with any pain or inflammation.  I do think she had an aggravation of her right knee pain, she does have evidence of chondrocalcinosis, likely indicative of pseudogout.  I do think this will get better as she gets back to normal walking without the boot, however her pain was severe enough today that we discussed all treatment options and she elected to proceed with corticosteroid injection into the right knee.  Patient tolerated well.  She may use ice and/or Tylenol for any postinjection pain.  Ensured her it was okay to use topical Voltaren gel for the knee pain as well.  We will see her back in about 4-5 weeks to see how things are going.  Follow-up: Return for f/u in 4-5 weeks for achilles and knee with Dr. Rolena Infante.   Meds & Orders: No orders of the defined types were placed in this encounter.   Orders Placed This Encounter  Procedures   Large Joint Inj   XR Knee Complete 4 Views Right     Procedures: Large Joint Inj: R knee on 02/18/2022 10:40 AM Indications: pain Details: 22 G needle, anteromedial approach Medications: 2 mL lidocaine 1 %; 2 mL bupivacaine 0.25 %; 80 mg methylPREDNISolone acetate 40 MG/ML Outcome: tolerated well, no immediate complications Procedure, treatment alternatives, risks  and benefits explained, specific risks discussed. Consent was given by the patient. Immediately prior to procedure a time out was called to verify the correct patient, procedure, equipment, support staff and site/side marked as required. Patient was prepped and draped in the usual sterile fashion.         Clinical History: No specialty comments available.  She reports that she quit smoking about 16 months ago. Her smoking use included cigarettes. She smoked an average of .5 packs per day. She has never used smokeless tobacco. No results for input(s): "HGBA1C", "LABURIC" in the last 8760 hours.  Objective:     Physical Exam  Gen: Well-appearing, in no acute distress; non-toxic CV: Regular Rate. Well-perfused. Warm.  Resp: Breathing unlabored on room air; no wheezing. Psych: Fluid speech in conversation; appropriate affect; normal thought process Neuro: Sensation intact throughout. No gross coordination deficits.   Ortho Exam - Left achilles: There is some thickening over the midportion of the Achilles tendon with very mild TTP although improved from previous visits.  There is no warmth or erythema.  She can dorsiflex to about 95 degrees, no pain with plantarflexion or dorsiflexion.  She is able to perform bilateral heel raise, although some pain in the Achilles with this.  Nonantalgic gait today.  - Right knee: Positive TTP within the medial joint line of the knee.  No gross swelling or effusion present.  Range of motion 0-135 degrees.  Mild crepitus with knee flexion and extension.  Negative McMurray's testing.  Strength 5/5 in all directions.  Neurovascularly intact distally.  Imaging: XR Knee Complete 4 Views Right  Result Date: 02/18/2022 4 views of the right knee including bilateral AP standing, Rosenberg, lateral and sunrise views were ordered and reviewed by myself.  X-rays demonstrate preserved joint spaces in the medial and lateral compartment.  There is chondrocalcinosis present throughout both knees, lateral joint space more so than medial joint space.  There is a small spur off the posterior aspect of the tibia on the lateral film.  Mild spurring off the lateral facet of the patella.  No acute fracture or otherwise bony abnormality noted.   Past Medical/Family/Surgical/Social History: Medications & Allergies reviewed per EMR, new medications updated. Patient Active Problem List   Diagnosis Date Noted   Persistent atrial fibrillation (HCC)    HNP (herniated nucleus pulposus) with myelopathy, cervical 10/28/2020   Paresthesia 09/26/2020   Cervical myelopathy ([place]) 09/02/2020    Cerebrovascular accident (CVA) ([place]) 09/02/2020   Weakness 08/29/2020   Right cervical radiculopathy 08/29/2020   Hyperlipidemia 11/24/2019   Anxiety disorder, unspecified 11/25/2007   ALLERGIC RHINITIS 11/25/2007   History of colonic polyps 11/25/2007   Class 1 obesity with body mass index (BMI) of 32.0 to 32.9 in adult 11/24/2006   PANIC DISORDER 11/24/2006   Essential hypertension 11/24/2006   PAROXYSMAL SUPRAVENTRICULAR TACHYCARDIA 11/24/2006   Past Medical History:  Diagnosis Date   Allergic rhinitis    Allergy    Anxiety    Arthritis    Cataract    Heart murmur    History of colonic polyps    History of PSVT (paroxysmal supraventricular tachycardia)    Hypertension    Impaired glucose tolerance    Obesity    PAT (paroxysmal atrial tachycardia)    Pre-diabetes    hx, not currently being monitor for diabetes, diet controlled   Stroke ([place]) 08/2020   Scan revealed stroke in history   Tobacco user    Vertigo    started 3 weeks  ago- after fluid trapped in ear   Family History  Problem Relation Age of Onset   Healthy Mother    Dementia Father    Healthy Sister    Healthy Sister    Alzheimer's disease Other    Colon cancer Neg Hx    Esophageal cancer Neg Hx    Rectal cancer Neg Hx    Stomach cancer Neg Hx    Colon polyps Neg Hx    Past Surgical History:  Procedure Laterality Date   ankle sx-left fx     ANTERIOR CERVICAL DECOMP/DISCECTOMY FUSION N/A 10/28/2020   Procedure: Anterior Cervical Decompression Fusion - Cervical Four-Cervical Five - Cervical Five-Cervical Six - Cervical Six- Cervical Seven;  Surgeon: Kary Kos, MD;  Location: [place];  Service: Neurosurgery;  Laterality: N/A;  Anterior Cervical Decompression Fusion - Cervical Four-Cervical Five - Cervical Five-Cervical Six - Cervical Six- Cervical Seven   ATRIAL FIBRILLATION ABLATION N/A 09/05/2021   Procedure: ATRIAL FIBRILLATION ABLATION;  Surgeon: Constance Haw, MD;  Location: [place] [facility];   Service: Cardiovascular;  Laterality: N/A;   CARDIOVERSION N/A 11/28/2020   Procedure: CARDIOVERSION;  Surgeon: Josue Hector, MD;  Location: [facility] ENDOSCOPY;  Service: Cardiovascular;  Laterality: N/A;   CARDIOVERSION N/A 04/15/2021   Procedure: CARDIOVERSION;  Surgeon: [name], MD;  Location: [place];  Service: Cardiovascular;  Laterality: N/A;   CATARACT EXTRACTION     both eyes   CHOLECYSTECTOMY N/A 01/11/2018   Procedure: LAPAROSCOPIC CHOLECYSTECTOMY WITH INTRAOPERATIVE CHOLANGIOGRAM ERAS PATHWAY;  Surgeon: Alphonsa Overall, MD;  Location: [place];  Service: General;  Laterality: N/A;   COLONOSCOPY  04/23/2005   DIAGNOSTIC LAPAROSCOPY     EYE SURGERY     foot sx     with the ankle surgery   POLYPECTOMY     Social History   Occupational History   Occupation: Retired  Tobacco Use   Smoking status: Former    Packs/day: 0.50    Types: Cigarettes    Quit date: 10/17/2020    Years since quitting: 1.3   Smokeless tobacco: Never  Vaping Use   Vaping Use: Never used  Substance and Sexual Activity   Alcohol use: Yes    Alcohol/week: 2.0 standard drinks of alcohol    Types: 2 Standard drinks or equivalent per week    Comment: weekends   Drug use: No   Sexual activity: Yes

## 2022-02-18 NOTE — Progress Notes (Signed)
Foot seems to be doing better; still wearing the boot as directed.  Here with new complaint today; right knee pain Pain has increased since wearing the boot  Taking tylenol for pain; does help

## 2022-02-19 ENCOUNTER — Ambulatory Visit (INDEPENDENT_AMBULATORY_CARE_PROVIDER_SITE_OTHER): Payer: Medicare PPO

## 2022-02-19 VITALS — Ht 67.0 in | Wt 219.0 lb

## 2022-02-19 DIAGNOSIS — Z0000 Encounter for general adult medical examination without abnormal findings: Secondary | ICD-10-CM

## 2022-02-19 NOTE — Patient Instructions (Addendum)
Glenda [name] , Thank you for taking time to come for your Medicare Wellness Visit. I appreciate your ongoing commitment to your health goals. Please review the following plan we discussed and let me know if I can assist you in the future.   These are the goals we discussed:  Goals       DIET - REDUCE FAT INTAKE      Low fat diet, avoid frying. Try to walk 15 min daily.       Lose weight (pt-stated)        This is a list of the screening recommended for you and due dates:  Health Maintenance  Topic Date Due   COVID-19 Vaccine (5 - Pfizer risk series) 03/07/2022*   Zoster (Shingles) Vaccine (1 of 2) 05/22/2022*   Flu Shot  08/16/2022*   Tetanus Vaccine  02/20/2023*   Colon Cancer Screening  03/01/2023   Mammogram  05/09/2023   Pneumonia Vaccine  Completed   DEXA scan (bone density measurement)  Completed   Hepatitis C Screening: USPSTF Recommendation to screen - Ages 53-79 yo.  Completed   HPV Vaccine  Aged Out  *Topic was postponed. The date shown is not the original due date.    Advanced directives: Advance directive discussed with you today. Even though you declined this today, please call our office should you change your mind, and we can give you the proper paperwork for you to fill out.   Conditions/risks identified: None  Next appointment: Follow up in one year for your annual wellness visit     Preventive Care 65 Years and Older, Female Preventive care refers to lifestyle choices and visits with your health care provider that can promote health and wellness. What does preventive care include? A yearly physical exam. This is also called an annual well check. Dental exams once or twice a year. Routine eye exams. Ask your health care provider how often you should have your eyes checked. Personal lifestyle choices, including: Daily care of your teeth and gums. Regular physical activity. Eating a healthy diet. Avoiding tobacco and drug use. Limiting alcohol  use. Practicing safe sex. Taking low-dose aspirin every day. Taking vitamin and mineral supplements as recommended by your health care provider. What happens during an annual well check? The services and screenings done by your health care provider during your annual well check will depend on your age, overall health, lifestyle risk factors, and family history of disease. Counseling  Your health care provider may ask you questions about your: Alcohol use. Tobacco use. Drug use. Emotional well-being. Home and relationship well-being. Sexual activity. Eating habits. History of falls. Memory and ability to understand (cognition). Work and work Statistician. Reproductive health. Screening  You may have the following tests or measurements: Height, weight, and BMI. Blood pressure. Lipid and cholesterol levels. These may be checked every 5 years, or more frequently if you are over 74 years old. Skin check. Lung cancer screening. You may have this screening every year starting at age 1 if you have a 30-pack-year history of smoking and currently smoke or have quit within the past 15 years. Fecal occult blood test (FOBT) of the stool. You may have this test every year starting at age 71. Flexible sigmoidoscopy or colonoscopy. You may have a sigmoidoscopy every 5 years or a colonoscopy every 10 years starting at age 47. Hepatitis C blood test. Hepatitis B blood test. Sexually transmitted disease (STD) testing. Diabetes screening. This is done by checking your blood sugar (glucose)  after you have not eaten for a while (fasting). You may have this done every 1-3 years. Bone density scan. This is done to screen for osteoporosis. You may have this done starting at age 52. Mammogram. This may be done every 1-2 years. Talk to your health care provider about how often you should have regular mammograms. Talk with your health care provider about your test results, treatment options, and if necessary,  the need for more tests. Vaccines  Your health care provider may recommend certain vaccines, such as: Influenza vaccine. This is recommended every year. Tetanus, diphtheria, and acellular pertussis (Tdap, Td) vaccine. You may need a Td booster every 10 years. Zoster vaccine. You may need this after age 31. Pneumococcal 13-valent conjugate (PCV13) vaccine. One dose is recommended after age 19. Pneumococcal polysaccharide (PPSV23) vaccine. One dose is recommended after age 56. Talk to your health care provider about which screenings and vaccines you need and how often you need them. This information is not intended to replace advice given to you by your health care provider. Make sure you discuss any questions you have with your health care provider. Document Released: 05/31/2015 Document Revised: 01/22/2016 Document Reviewed: 03/05/2015 Elsevier Interactive Patient Education  2017 [employer] Prevention in the Home Falls can cause injuries. They can happen to people of all ages. There are many things you can do to make your home safe and to help prevent falls. What can I do on the outside of my home? Regularly fix the edges of walkways and driveways and fix any cracks. Remove anything that might make you trip as you walk through a door, such as a raised step or threshold. Trim any bushes or trees on the path to your home. Use bright outdoor lighting. Clear any walking paths of anything that might make someone trip, such as rocks or tools. Regularly check to see if handrails are loose or broken. Make sure that both sides of any steps have handrails. Any raised decks and porches should have guardrails on the edges. Have any leaves, snow, or ice cleared regularly. Use sand or salt on walking paths during winter. Clean up any spills in your garage right away. This includes oil or grease spills. What can I do in the bathroom? Use night lights. Install grab bars by the toilet and in the  tub and shower. Do not use towel bars as grab bars. Use non-skid mats or decals in the tub or shower. If you need to sit down in the shower, use a plastic, non-slip stool. Keep the floor dry. Clean up any water that spills on the floor as soon as it happens. Remove soap buildup in the tub or shower regularly. Attach bath mats securely with double-sided non-slip rug tape. Do not have throw rugs and other things on the floor that can make you trip. What can I do in the bedroom? Use night lights. Make sure that you have a light by your bed that is easy to reach. Do not use any sheets or blankets that are too big for your bed. They should not hang down onto the floor. Have a firm chair that has side arms. You can use this for support while you get dressed. Do not have throw rugs and other things on the floor that can make you trip. What can I do in the kitchen? Clean up any spills right away. Avoid walking on wet floors. Keep items that you use a lot in easy-to-reach places. If  you need to reach something above you, use a strong step stool that has a grab bar. Keep electrical cords out of the way. Do not use floor polish or wax that makes floors slippery. If you must use wax, use non-skid floor wax. Do not have throw rugs and other things on the floor that can make you trip. What can I do with my stairs? Do not leave any items on the stairs. Make sure that there are handrails on both sides of the stairs and use them. Fix handrails that are broken or loose. Make sure that handrails are as long as the stairways. Check any carpeting to make sure that it is firmly attached to the stairs. Fix any carpet that is loose or worn. Avoid having throw rugs at the top or bottom of the stairs. If you do have throw rugs, attach them to the floor with carpet tape. Make sure that you have a light switch at the top of the stairs and the bottom of the stairs. If you do not have them, ask someone to add them for  you. What else can I do to help prevent falls? Wear shoes that: Do not have high heels. Have rubber bottoms. Are comfortable and fit you well. Are closed at the toe. Do not wear sandals. If you use a stepladder: Make sure that it is fully opened. Do not climb a closed stepladder. Make sure that both sides of the stepladder are locked into place. Ask someone to hold it for you, if possible. Clearly mark and make sure that you can see: Any grab bars or handrails. First and last steps. Where the edge of each step is. Use tools that help you move around (mobility aids) if they are needed. These include: Canes. Walkers. Scooters. Crutches. Turn on the lights when you go into a dark area. Replace any light bulbs as soon as they burn out. Set up your furniture so you have a clear path. Avoid moving your furniture around. If any of your floors are uneven, fix them. If there are any pets around you, be aware of where they are. Review your medicines with your doctor. Some medicines can make you feel dizzy. This can increase your chance of falling. Ask your doctor what other things that you can do to help prevent falls. This information is not intended to replace advice given to you by your health care provider. Make sure you discuss any questions you have with your health care provider. Document Released: 02/28/2009 Document Revised: 10/10/2015 Document Reviewed: 06/08/2014 Elsevier Interactive Patient Education  2017 [employer].

## 2022-02-19 NOTE — Progress Notes (Addendum)
Subjective:   Glenda [name] is a 71 y.o. female who presents for Medicare Annual (Subsequent) preventive examination.  Review of Systems    Virtual Visit via Telephone Note  I connected with  Glenda [name] on 02/19/22 at 10:45 AM EDT by telephone and verified that I am speaking with the correct person using two identifiers.  Location: Patient: Home Provider: Office Persons participating in the virtual visit: patient/Nurse Health Advisor   I discussed the limitations, risks, security and privacy concerns of performing an evaluation and management service by telephone and the availability of in person appointments. The patient expressed understanding and agreed to proceed.  Interactive audio and video telecommunications were attempted between this nurse and patient, however failed, due to patient having technical difficulties OR patient did not have access to video capability.  We continued and completed visit with audio only.  Some vital signs may be absent or patient reported.   Criselda Peaches, LPN  Cardiac Risk Factors include: advanced age (>86mn, >>89women);hypertension     Objective:    Today's Vitals   02/19/22 1049  Weight: 219 lb (99.3 kg)  Height: '5\' 7"'$  (1.702 m)   Body mass index is 34.3 kg/m.     02/19/2022   10:58 AM 09/05/2021    6:01 AM 07/23/2021   10:55 PM 05/27/2021    9:05 PM 11/28/2020    7:53 AM 10/28/2020    6:10 AM 10/24/2020    9:14 AM  Advanced Directives  Does Patient Have a Medical Advance Directive? No No No No No  No  Would patient like information on creating a medical advance directive? No - Patient declined No - Patient declined No - Patient declined No - Patient declined No - Patient declined  No - Patient declined     Information is confidential and restricted. Go to Review Flowsheets to unlock data.    Current Medications (verified) Outpatient Encounter Medications as of 02/19/2022  Medication Sig   acetaminophen (TYLENOL) 500 MG  tablet Take 1,000 mg by mouth 2 (two) times daily as needed for moderate pain.   ALPRAZolam (XANAX) 0.25 MG tablet TAKE 1 TABLET BY MOUTH DAILY AS NEEDED FOR ANXIETY   diclofenac Sodium (VOLTAREN) 1 % GEL Apply 4 g topically 4 (four) times daily.   diphenhydramine-acetaminophen (TYLENOL PM) 25-500 MG TABS tablet Take 1 tablet by mouth at bedtime as needed (sleep).   ELIQUIS 5 MG TABS tablet TAKE 1 TABLET BY MOUTH TWICE A DAY   hydrochlorothiazide (HYDRODIURIL) 25 MG tablet Take 0.5 tablets (12.5 mg total) by mouth daily.   metoprolol succinate (TOPROL XL) 25 MG 24 hr tablet Take 1 tablet (25 mg total) by mouth at bedtime.   PARoxetine (PAXIL) 40 MG tablet TAKE 1 TABLET BY MOUTH EVERY DAY   rosuvastatin (CRESTOR) 10 MG tablet TAKE 1 TABLET BY MOUTH EVERY DAY   [DISCONTINUED] metoprolol tartrate (LOPRESSOR) 25 MG tablet Take 1 tablet (25 mg total) by mouth 3 (three) times daily.   No facility-administered encounter medications on file as of 02/19/2022.    Allergies (verified) Amoxicillin and Erythromycin   History: Past Medical History:  Diagnosis Date   Allergic rhinitis    Allergy    Anxiety    Arthritis    Cataract    Heart murmur    History of colonic polyps    History of PSVT (paroxysmal supraventricular tachycardia)    Hypertension    Impaired glucose tolerance    Obesity  PAT (paroxysmal atrial tachycardia)    Pre-diabetes    hx, not currently being monitor for diabetes, diet controlled   Stroke ([place]) 08/2020   Scan revealed stroke in history   Tobacco user    Vertigo    started 3 weeks ago- after fluid trapped in ear   Past Surgical History:  Procedure Laterality Date   ankle sx-left fx     ANTERIOR CERVICAL DECOMP/DISCECTOMY FUSION N/A 10/28/2020   Procedure: Anterior Cervical Decompression Fusion - Cervical Four-Cervical Five - Cervical Five-Cervical Six - Cervical Six- Cervical Seven;  Surgeon: Kary Kos, MD;  Location: [place];  Service: Neurosurgery;  Laterality:  N/A;  Anterior Cervical Decompression Fusion - Cervical Four-Cervical Five - Cervical Five-Cervical Six - Cervical Six- Cervical Seven   ATRIAL FIBRILLATION ABLATION N/A 09/05/2021   Procedure: ATRIAL FIBRILLATION ABLATION;  Surgeon: Constance Haw, MD;  Location: [facility];  Service: Cardiovascular;  Laterality: N/A;   CARDIOVERSION N/A 11/28/2020   Procedure: CARDIOVERSION;  Surgeon: Josue Hector, MD;  [facility];  Service: Cardiovascular;  Laterality: N/A;   CARDIOVERSION N/A 04/15/2021   Procedure: CARDIOVERSION;  Surgeon: [name], MD;  Location: [facility] ENDOSCOPY;  Service: Cardiovascular;  Laterality: N/A;   CATARACT EXTRACTION     both eyes   CHOLECYSTECTOMY N/A 01/11/2018   Procedure: LAPAROSCOPIC CHOLECYSTECTOMY WITH INTRAOPERATIVE CHOLANGIOGRAM ERAS PATHWAY;  Surgeon: Alphonsa Overall, MD;  Location: [place];  Service: General;  Laterality: N/A;   COLONOSCOPY  04/23/2005   DIAGNOSTIC LAPAROSCOPY     EYE SURGERY     foot sx     with the ankle surgery   POLYPECTOMY     Family History  Problem Relation Age of Onset   Healthy Mother    Dementia Father    Healthy Sister    Healthy Sister    Alzheimer's disease Other    Colon cancer Neg Hx    Esophageal cancer Neg Hx    Rectal cancer Neg Hx    Stomach cancer Neg Hx    Colon polyps Neg Hx    Social History   Socioeconomic History   Marital status: Married    Spouse name: Not on file   Number of children: 2   Years of education: college   Highest education level: Not on file  Occupational History   Occupation: Retired  Tobacco Use   Smoking status: Former    Packs/day: 0.50    Types: Cigarettes    Quit date: 10/17/2020    Years since quitting: 1.3   Smokeless tobacco: Never  Vaping Use   Vaping Use: Never used  Substance and Sexual Activity   Alcohol use: Yes    Alcohol/week: 2.0 standard drinks of alcohol    Types: 2 Standard drinks or equivalent per week    Comment: weekends   Drug  use: No   Sexual activity: Yes  Other Topics Concern   Not on file  Social History Narrative   Lives at home with her husband.   Right-handed.   One cup caffeine per day.   Social Determinants of Health   Financial Resource Strain: Low Risk  (02/19/2022)   Overall Financial Resource Strain (CARDIA)    Difficulty of Paying Living Expenses: Not hard at all  Food Insecurity: No Food Insecurity (02/19/2022)   Hunger Vital Sign    Worried About Running Out of Food in the Last Year: Never true    Ran Out of Food in the Last Year: Never true  Transportation Needs: No Transportation Needs (02/19/2022)   PRAPARE - Hydrologist (Medical): No    Lack of Transportation (Non-Medical): No  Physical Activity: Inactive (02/19/2022)   Exercise Vital Sign    Days of Exercise per Week: 0 days    Minutes of Exercise per Session: 0 min  Stress: No Stress Concern Present (02/19/2022)   [place]    Feeling of Stress : Not at all  Social Connections: Moderately Isolated (02/19/2022)   Social Connection and Isolation Panel [NHANES]    Frequency of Communication with Friends and Family: More than three times a week    Frequency of Social Gatherings with Friends and Family: More than three times a week    Attends Religious Services: Never    Marine scientist or Organizations: No    Attends Music therapist: Never    Marital Status: Married    Tobacco Counseling Counseling given: Not Answered   Clinical Intake:  Pre-visit preparation completed: Yes  Pain : No/denies pain     BMI - recorded: 34.3  How often do you need to have someone help you when you read instructions, pamphlets, or other written materials from your doctor or pharmacy?: 1 - Never  Diabetic?  No  Interpreter Needed?: No  Information entered by :: Rolene Arbour LPN   Activities of Daily Living    02/19/2022    10:55 AM 02/13/2022   11:17 AM  In your present state of health, do you have any difficulty performing the following activities:  Hearing? 0 0  Vision? 0 0  Difficulty concentrating or making decisions? 0 0  Walking or climbing stairs? 0 0  Dressing or bathing? 0 0  Doing errands, shopping? 0 0  Preparing Food and eating ? N N  Using the Toilet? N N  In the past six months, have you accidently leaked urine? N Y  Do you have problems with loss of bowel control? N N  Managing your Medications? N N  Managing your Finances? N N  Housekeeping or managing your Housekeeping? N N    Patient Care Team: [place], Betty G, MD as PCP - General (Family Medicine) Buford Dresser, MD as PCP - Cardiology (Cardiology) Constance Haw, MD as PCP - Electrophysiology (Cardiology) Troy Sine, MD as PCP - Sleep Medicine (Cardiology)  Indicate any recent Medical Services you may have received from other than [facility] providers in the past year (date may be approximate).     Assessment:   This is a routine wellness examination for [place].  Hearing/Vision screen Hearing Screening - Comments:: Denies hearing difficulties   Vision Screening - Comments:: Wears rx glasses - up to date with routine eye exams with  Dr Gershon Crane  Dietary issues and exercise activities discussed: Current Exercise Habits: The patient does not participate in regular exercise at present, Exercise limited by: None identified   Goals Addressed               This Visit's Progress     Lose weight (pt-stated)         Depression Screen    02/19/2022   10:54 AM 02/18/2021    9:08 AM 11/24/2019   10:33 AM 07/18/2018    9:19 AM 05/01/2016    9:11 AM 03/31/2013   10:20 AM 03/31/2013   10:18 AM  PHQ 2/9 Scores  PHQ - 2 Score 0 0 0 0 0 0  0    Fall Risk    02/19/2022   10:57 AM 02/13/2022   11:17 AM 02/18/2021    9:07 AM 11/24/2019   10:32 AM 05/01/2016    9:11 AM  Fall Risk   Falls in the past year? '1 1 1 '$ 0 No   Number falls in past yr: 0 1 0 0   Injury with Fall? 0 0 0 0   Comment No injury or need for medical attention      Risk for fall due to : No Fall Risks      Follow up   Education provided Education provided     [place]:  Any stairs in or around the home? Yes  If so, are there any without handrails? No  Home free of loose throw rugs in walkways, pet beds, electrical cords, etc? Yes  Adequate lighting in your home to reduce risk of falls? Yes   ASSISTIVE DEVICES UTILIZED TO PREVENT FALLS:  Life alert? No  Use of a cane, walker or w/c? No  Grab bars in the bathroom? No  Shower chair or bench in shower? Yes  Elevated toilet seat or a handicapped toilet? No   TIMED UP AND GO:  Was the test performed? No . Audio Visit  Cognitive Function:        02/19/2022   10:58 AM  6CIT Screen  What Year? 0 points  What month? 0 points  What time? 0 points  Count back from 20 0 points  Months in reverse 0 points  Repeat phrase 0 points  Total Score 0 points    Immunizations Immunization History  Administered Date(s) Administered   Fluad Quad(high Dose 65+) 01/29/2020, 02/18/2021   Influenza Split 03/13/2011, 03/18/2012   Influenza Whole 02/15/2006, 04/19/2007   Influenza, High Dose Seasonal PF 04/02/2016, 03/17/2017, 03/11/2018   Influenza,inj,Quad PF,6+ Mos 03/31/2013, 04/06/2014, 03/22/2015, 02/11/2019   PFIZER(Purple Top)SARS-COV-2 Vaccination 06/23/2019, 07/19/2019, 02/15/2020   Pfizer Covid-19 Vaccine Bivalent Booster 23yr & up 03/04/2021   Pneumococcal Conjugate-13 05/01/2016   Pneumococcal Polysaccharide-23 06/07/2017   Tdap 03/13/2011    TDAP status: Due, Education has been provided regarding the importance of this vaccine. Advised may receive this vaccine at local pharmacy or Health Dept. Aware to provide a copy of the vaccination record if obtained from local pharmacy or Health Dept. Verbalized acceptance and understanding.  Flu  Vaccine status: Up to date  Pneumococcal vaccine status: Up to date  Covid-19 vaccine status: Completed vaccines  Qualifies for Shingles Vaccine? Yes   Zostavax completed No   Shingrix Completed?: No.    Education has been provided regarding the importance of this vaccine. Patient has been advised to call insurance company to determine out of pocket expense if they have not yet received this vaccine. Advised may also receive vaccine at local pharmacy or Health Dept. Verbalized acceptance and understanding.  Screening Tests Health Maintenance  Topic Date Due   COVID-19 Vaccine (5 - Pfizer risk series) 03/07/2022 (Originally 04/29/2021)   Zoster Vaccines- Shingrix (1 of 2) 05/22/2022 (Originally 12/20/1969)   INFLUENZA VACCINE  08/16/2022 (Originally 12/16/2021)   TETANUS/TDAP  02/20/2023 (Originally 03/12/2021)   COLONOSCOPY (Pts 45-482yrInsurance coverage will need to be confirmed)  03/01/2023   MAMMOGRAM  05/09/2023   Pneumonia Vaccine 6583Years old  Completed   DEXA SCAN  Completed   Hepatitis C Screening  Completed   HPV VACCINES  Aged Out    Health Maintenance  There are  no preventive care reminders to display for this patient.   Colorectal cancer screening: Type of screening: Colonoscopy. Completed 02/29/20. Repeat every 3 years  Mammogram status: Completed 05/08/21. Repeat every year  Bone Density status: Completed 01/30/20. Results reflect: Bone density results: OSTEOPOROSIS. Repeat every   years.  Lung Cancer Screening: (Low Dose CT Chest recommended if Age 81-80 years, 30 pack-year currently smoking OR have quit w/in 15years.) does not qualify.     Additional Screening:  Hepatitis C Screening: does qualify; Completed 06/07/17  Vision Screening: Recommended annual ophthalmology exams for early detection of glaucoma and other disorders of the eye. Is the patient up to date with their annual eye exam?  Yes  Who is the provider or what is the name of the office in which  the patient attends annual eye exams? Dr Gershon Crane If pt is not established with a provider, would they like to be referred to a provider to establish care? No .   Dental Screening: Recommended annual dental exams for proper oral hygiene  Community Resource Referral / Chronic Care Management:  CRR required this visit?  No   CCM required this visit?  No      Plan:     I have personally reviewed and noted the following in the patient's chart:   Medical and social history Use of alcohol, tobacco or illicit drugs  Current medications and supplements including opioid prescriptions. Patient is not currently taking opioid prescriptions. Functional ability and status Nutritional status Physical activity Advanced directives List of other physicians Hospitalizations, surgeries, and ER visits in previous 12 months Vitals Screenings to include cognitive, depression, and falls Referrals and appointments  In addition, I have reviewed and discussed with patient certain preventive protocols, quality metrics, and best practice recommendations. A written personalized care plan for preventive services as well as general preventive health recommendations were provided to patient.     Criselda Peaches, LPN   [id number]   Nurse Notes: None

## 2022-03-09 NOTE — Progress Notes (Signed)
HPI: Ms.Glenda [name] is a 72 y.o. female with medical hx significant for hypertension, prediabetes, persistent atrial fibrillation on chronic anticoagulation,CVA, HLD,and anxiety here today for her routine physical.  Last CPE: 02/19/22 AWV CPE on 11/24/19.  She has seen her cardiologist since her last visit with [place], having undergone an ablation on 09/05/21. She has had unsuccessful cardioversion in the past..  Last visit on 12/10/21.  She also saw orthopedist on October 4th this year for her Achilles tendinitis and right knee pain. She had a sleep study on July 26 for sleep apnea, which is managed by Dr. Claiborne Billings, cardiologist.  She exercises some, walking for 30 minutes once or twice per week and maintains a healthy diet, including high-fiber foods and reduced carbohydrate intake.  She sleeps for approximately eight hours per night with her CPAP.  She quit smoking in June 2022 after an on-and-off smoking history of 30 years, averaging a quarter pack per day.  Immunization History  Administered Date(s) Administered   Fluad Quad(high Dose 65+) 01/29/2020, 02/18/2021, 03/10/2022   Influenza Split 03/13/2011, 03/18/2012   Influenza Whole 02/15/2006, 04/19/2007   Influenza, High Dose Seasonal PF 04/02/2016, 03/17/2017, 03/11/2018   Influenza,inj,Quad PF,6+ Mos 03/31/2013, 04/06/2014, 03/22/2015, 02/11/2019   PFIZER(Purple Top)SARS-COV-2 Vaccination 06/23/2019, 07/19/2019, 02/15/2020   Pfizer Covid-19 Vaccine Bivalent Booster 75yr & up 03/04/2021   Pneumococcal Conjugate-13 05/01/2016   Pneumococcal Polysaccharide-23 06/07/2017   Tdap 03/13/2011  She had a bone density test in September 2021, which revealed mild osteopenia in her left hip.  She takes multivitamins for women over 50, containing calcium and vitamin D.  Health Maintenance  Topic Date Due   COVID-19 Vaccine (5 - Pfizer risk series) 03/26/2022 (Originally 04/29/2021)   Zoster Vaccines- Shingrix (1 of 2) 05/22/2022 (Originally  12/20/1969)   TETANUS/TDAP  02/20/2023 (Originally 03/12/2021)   [place] ([place]  02/20/2023   COLONOSCOPY (Pts 45-466yrInsurance coverage will need to be confirmed)  03/01/2023   MAMMOGRAM  05/09/2023   Pneumonia Vaccine 6537Years old  Completed   INFLUENZA VACCINE  Completed   DEXA SCAN  Completed   Hepatitis C Screening  Completed   HPV VACCINES  Aged Out   Prediabetes: Negative for polydipsia,polyuria, or polyphagia.  Lab Results  Component Value Date   HGBA1C 6.4 02/18/2021   HLD on Rosuvastatin 10 mg daily. Lab Results  Component Value Date   CHOL 170 02/18/2021   HDL 39.20 02/18/2021   LDLCALC 112 (H) 02/18/2021   LDLDIRECT 170.5 06/06/2010   TRIG 96.0 02/18/2021   CHOLHDL 4 02/18/2021   HTN and atrial fib on Metoprolol succinate 25 mg daily and HCTZ 25 mg daily. Lab Results  Component Value Date   CREATININE 0.79 08/22/2021   BUN 17 08/22/2021   NA 142 08/22/2021   K 4.7 08/22/2021   CL 104 08/22/2021   CO2 26 08/22/2021   Anxiety on Paxil 40 mg daily and Alprazolam 0.25 mg daily prn. She has taken these medications for a while and still helping.  Review of Systems  Constitutional:  Negative for appetite change and fever.  HENT:  Negative for hearing loss, mouth sores and sore throat.   Eyes:  Negative for redness and visual disturbance.  Respiratory:  Negative for cough, shortness of breath and wheezing.   Cardiovascular:  Positive for palpitations. Negative for chest pain and leg swelling.  Gastrointestinal:  Negative for abdominal pain, nausea and vomiting.       No changes in bowel habits.  Endocrine: Negative for cold intolerance and heat intolerance.  Genitourinary:  Negative for decreased urine volume, dysuria, hematuria, vaginal bleeding and vaginal discharge.  Musculoskeletal:  Positive for arthralgias. Negative for gait problem.  Skin:  Negative for color change and rash.  Allergic/Immunologic: Positive for environmental allergies.   Neurological:  Negative for syncope, weakness and headaches.  Psychiatric/Behavioral:  Negative for confusion. The patient is nervous/anxious.   All other systems reviewed and are negative.  Current Outpatient Medications on File Prior to Visit  Medication Sig Dispense Refill   acetaminophen (TYLENOL) 500 MG tablet Take 1,000 mg by mouth 2 (two) times daily as needed for moderate pain.     diclofenac Sodium (VOLTAREN) 1 % GEL Apply 4 g topically 4 (four) times daily. 30 g 0   diphenhydramine-acetaminophen (TYLENOL PM) 25-500 MG TABS tablet Take 1 tablet by mouth at bedtime as needed (sleep).     ELIQUIS 5 MG TABS tablet TAKE 1 TABLET BY MOUTH TWICE A DAY 180 tablet 1   hydrochlorothiazide (HYDRODIURIL) 25 MG tablet Take 0.5 tablets (12.5 mg total) by mouth daily. 45 tablet 3   metoprolol succinate (TOPROL XL) 25 MG 24 hr tablet Take 1 tablet (25 mg total) by mouth at bedtime.     PARoxetine (PAXIL) 40 MG tablet TAKE 1 TABLET BY MOUTH EVERY DAY 90 tablet 2   rosuvastatin (CRESTOR) 10 MG tablet TAKE 1 TABLET BY MOUTH EVERY DAY 90 tablet 1   [DISCONTINUED] metoprolol tartrate (LOPRESSOR) 25 MG tablet Take 1 tablet (25 mg total) by mouth 3 (three) times daily. 90 tablet 0   No current facility-administered medications on file prior to visit.   Past Medical History:  Diagnosis Date   Allergic rhinitis    Allergy    Anxiety    Arthritis    Cataract    Heart murmur    History of colonic polyps    History of PSVT (paroxysmal supraventricular tachycardia)    Hypertension    Impaired glucose tolerance    Obesity    PAT (paroxysmal atrial tachycardia)    Pre-diabetes    hx, not currently being monitor for diabetes, diet controlled   Stroke ([place]) 08/2020   Scan revealed stroke in history   Tobacco user    Vertigo    started 3 weeks ago- after fluid trapped in ear   Past Surgical History:  Procedure Laterality Date   ankle sx-left fx     ANTERIOR CERVICAL DECOMP/DISCECTOMY FUSION N/A  10/28/2020   Procedure: Anterior Cervical Decompression Fusion - Cervical Four-Cervical Five - Cervical Five-Cervical Six - Cervical Six- Cervical Seven;  Surgeon: Kary Kos, MD;  Location: [facility];  Service: Neurosurgery;  Laterality: N/A;  Anterior Cervical Decompression Fusion - Cervical Four-Cervical Five - Cervical Five-Cervical Six - Cervical Six- Cervical Seven   ATRIAL FIBRILLATION ABLATION N/A 09/05/2021   Procedure: ATRIAL FIBRILLATION ABLATION;  Surgeon: Constance Haw, MD;  Location: [facility];  Service: Cardiovascular;  Laterality: N/A;   CARDIOVERSION N/A 11/28/2020   Procedure: CARDIOVERSION;  Surgeon: Josue Hector, MD;  Location: [facility];  Service: Cardiovascular;  Laterality: N/A;   CARDIOVERSION N/A 04/15/2021   Procedure: CARDIOVERSION;  Surgeon: [name], MD;  Location: [facility] ENDOSCOPY;  Service: Cardiovascular;  Laterality: N/A;   CATARACT EXTRACTION     both eyes   CHOLECYSTECTOMY N/A 01/11/2018   Procedure: LAPAROSCOPIC CHOLECYSTECTOMY WITH INTRAOPERATIVE CHOLANGIOGRAM ERAS PATHWAY;  Surgeon: Alphonsa Overall, MD;  Location: [place];  Service: General;  Laterality: N/A;  COLONOSCOPY  04/23/2005   DIAGNOSTIC LAPAROSCOPY     EYE SURGERY     foot sx     with the ankle surgery   POLYPECTOMY      Allergies  Allergen Reactions   Amoxicillin Rash and Other (See Comments)    Has patient had a PCN reaction causing immediate rash, facial/tongue/throat swelling, SOB or lightheadedness with hypotension: No HAS PT DEVELOPED SEVERE RASH INVOLVING MUCUS MEMBRANES or SKIN NECROSIS: #  #  YES  #  #  Has patient had a PCN reaction that required hospitalization: No Has patient had a PCN reaction occurring within the last 10 years: No    Erythromycin Other (See Comments)    Upset stomach   Family History  Problem Relation Age of Onset   Healthy Mother    Dementia Father    Healthy Sister    Healthy Sister    Alzheimer's disease Other    Colon cancer Neg Hx     Esophageal cancer Neg Hx    Rectal cancer Neg Hx    Stomach cancer Neg Hx    Colon polyps Neg Hx    Social History   Socioeconomic History   Marital status: Married    Spouse name: Not on file   Number of children: 2   Years of education: college   Highest education level: Not on file  Occupational History   Occupation: Retired  Tobacco Use   Smoking status: Former    Packs/day: 0.25    Years: 50.00    Total pack years: 12.50    Types: Cigarettes    Quit date: 10/17/2020    Years since quitting: 1.4   Smokeless tobacco: Never  Vaping Use   Vaping Use: Never used  Substance and Sexual Activity   Alcohol use: Yes    Alcohol/week: 2.0 standard drinks of alcohol    Types: 2 Standard drinks or equivalent per week    Comment: weekends   Drug use: No   Sexual activity: Yes  Other Topics Concern   Not on file  Social History Narrative   Lives at home with her husband.   Right-handed.   One cup caffeine per day.   Social Determinants of Health   Financial Resource Strain: Low Risk  (02/19/2022)   Overall Financial Resource Strain (CARDIA)    Difficulty of Paying Living Expenses: Not hard at all  Food Insecurity: No Food Insecurity (02/19/2022)   Hunger Vital Sign    Worried About Running Out of Food in the Last Year: Never true    Ran Out of Food in the Last Year: Never true  Transportation Needs: No Transportation Needs (02/19/2022)   PRAPARE - Hydrologist (Medical): No    Lack of Transportation (Non-Medical): No  Physical Activity: Inactive (02/19/2022)   Exercise Vital Sign    Days of Exercise per Week: 0 days    Minutes of Exercise per Session: 0 min  Stress: No Stress Concern Present (02/19/2022)   [place]    Feeling of Stress : Not at all  Social Connections: Moderately Isolated (02/19/2022)   Social Connection and Isolation Panel [NHANES]    Frequency of  Communication with Friends and Family: More than three times a week    Frequency of Social Gatherings with Friends and Family: More than three times a week    Attends Religious Services: Never    Marine scientist or Organizations:  No    Attends Archivist Meetings: Never    Marital Status: Married   Vitals:   03/10/22 0856  BP: 120/70  Pulse: 96  Resp: 12  Temp: 98.4 F (36.9 C)  SpO2: 97%  Body mass index is 33.83 kg/m. Wt Readings from Last 3 Encounters:  03/10/22 216 lb (98 kg)  02/19/22 219 lb (99.3 kg)  02/04/22 230 lb (104.3 kg)  Physical Exam Vitals and nursing note reviewed.  Constitutional:      General: She is not in acute distress.    Appearance: She is well-developed.  HENT:     Head: Normocephalic and atraumatic.     Right Ear: Hearing, tympanic membrane, ear canal and external ear normal.     Left Ear: Hearing, tympanic membrane, ear canal and external ear normal.     Mouth/Throat:     Mouth: Mucous membranes are moist.     Pharynx: Oropharynx is clear. Uvula midline.  Eyes:     Conjunctiva/sclera: Conjunctivae normal.     Pupils: Pupils are equal, round, and reactive to light.  Neck:     Thyroid: No thyroid mass.     Trachea: No tracheal deviation.  Cardiovascular:     Rate and Rhythm: Normal rate. Rhythm irregular. Occasional Extrasystoles are present.    Pulses:          Dorsalis pedis pulses are 2+ on the right side and 2+ on the left side.     Heart sounds: No murmur heard. Pulmonary:     Effort: Pulmonary effort is normal. No respiratory distress.     Breath sounds: Normal breath sounds.  Abdominal:     Palpations: Abdomen is soft. There is no hepatomegaly or mass.     Tenderness: There is no abdominal tenderness.  Musculoskeletal:     Comments: No signs of synovitis appreciated.  Lymphadenopathy:     Cervical: No cervical adenopathy.  Skin:    General: Skin is warm.     Findings: No erythema or rash.  Neurological:      General: No focal deficit present.     Mental Status: She is alert and oriented to person, place, and time.     Cranial Nerves: No cranial nerve deficit.     Coordination: Coordination normal.     Gait: Gait normal.     Deep Tendon Reflexes:     Reflex Scores:      Bicep reflexes are 2+ on the right side and 2+ on the left side.      Patellar reflexes are 2+ on the right side and 2+ on the left side. Psychiatric:        Mood and Affect: Mood and affect normal.   ASSESSMENT AND PLAN:  Ms. Glenda [name] was here today annual physical examination.  Orders Placed This Encounter  Procedures   Flu Vaccine QUAD High Dose(Fluad)   Comprehensive metabolic panel   Hemoglobin A1c   Lipid panel   Lab Results  Component Value Date   HGBA1C 6.5 03/10/2022   Lab Results  Component Value Date   CREATININE 0.71 03/10/2022   BUN 17 03/10/2022   NA 140 03/10/2022   K 4.2 03/10/2022   CL 99 03/10/2022   CO2 32 03/10/2022   Lab Results  Component Value Date   ALT 18 03/10/2022   AST 23 03/10/2022   ALKPHOS 87 03/10/2022   BILITOT 0.9 03/10/2022   Lab Results  Component Value Date   CHOL 107 03/10/2022  HDL 43.90 03/10/2022   LDLCALC 47 03/10/2022   LDLDIRECT 170.5 06/06/2010   TRIG 82.0 03/10/2022   CHOLHDL 2 03/10/2022   Routine general medical examination at a health care facility We discussed the importance of regular physical activity and healthy diet for prevention of chronic illness and/or complications. Preventive guidelines reviewed. Vaccination: Recommend shingrix at her pharmacy. Flu vaccine given today.  Ca++ and vit D supplementation to continue. Next CPE in a year.  Hyperlipidemia, unspecified hyperlipidemia type Continue rosuvastatin 10 mg daily and low-fat diet.  Essential hypertension BP adequately controlled. Continue metoprolol succinate 25 mg daily and HCTZ 25 mg 1/2 tablet daily. Continue low-salt diet.  Anxiety disorder, unspecified type Problem  has been stable. Continue paroxetine 40 mg daily and alprazolam 0.25 mg as needed.  The latter one she does not take it frequently, usually 30 tablets last almost a year. As far as problem is a stable, it is appropriate to continue following annually.  -     ALPRAZolam (XANAX) 0.25 MG tablet; Take 1 tablet (0.25 mg total) by mouth daily as needed. for anxiety  Prediabetes Consistency with a healthy lifestyle encouraged for diabetes prevention. Further recommendation will be given according to hemoglobin A1c result.  Need for influenza vaccination -     Flu Vaccine QUAD High Dose(Fluad)  Return in about 1 year (around 03/11/2023) for CPE, f/u before if needed..  Kevyn Wengert G. [place], MD  [facility] [place] office.

## 2022-03-10 ENCOUNTER — Encounter: Payer: Self-pay | Admitting: Family Medicine

## 2022-03-10 ENCOUNTER — Ambulatory Visit (INDEPENDENT_AMBULATORY_CARE_PROVIDER_SITE_OTHER): Payer: Medicare PPO | Admitting: Family Medicine

## 2022-03-10 VITALS — BP 120/70 | HR 96 | Temp 98.4°F | Resp 12 | Ht 67.0 in | Wt 216.0 lb

## 2022-03-10 DIAGNOSIS — R7303 Prediabetes: Secondary | ICD-10-CM | POA: Diagnosis not present

## 2022-03-10 DIAGNOSIS — Z0000 Encounter for general adult medical examination without abnormal findings: Secondary | ICD-10-CM

## 2022-03-10 DIAGNOSIS — E785 Hyperlipidemia, unspecified: Secondary | ICD-10-CM

## 2022-03-10 DIAGNOSIS — F419 Anxiety disorder, unspecified: Secondary | ICD-10-CM

## 2022-03-10 DIAGNOSIS — Z23 Encounter for immunization: Secondary | ICD-10-CM | POA: Diagnosis not present

## 2022-03-10 DIAGNOSIS — I10 Essential (primary) hypertension: Secondary | ICD-10-CM

## 2022-03-10 LAB — COMPREHENSIVE METABOLIC PANEL
ALT: 18 U/L (ref 0–35)
AST: 23 U/L (ref 0–37)
Albumin: 4.2 g/dL (ref 3.5–5.2)
Alkaline Phosphatase: 87 U/L (ref 39–117)
BUN: 17 mg/dL (ref 6–23)
CO2: 32 mEq/L (ref 19–32)
Calcium: 9.8 mg/dL (ref 8.4–10.5)
Chloride: 99 mEq/L (ref 96–112)
Creatinine, Ser: 0.71 mg/dL (ref 0.40–1.20)
GFR: 85.67 mL/min (ref >60.00)
Glucose, Bld: 114 mg/dL — ABNORMAL HIGH (ref 70–99)
Potassium: 4.2 mEq/L (ref 3.5–5.1)
Sodium: 140 mEq/L (ref 135–145)
Total Bilirubin: 0.9 mg/dL (ref 0.2–1.2)
Total Protein: 7.8 g/dL (ref 6.0–8.3)

## 2022-03-10 LAB — LIPID PANEL
Cholesterol: 107 mg/dL (ref 0–200)
HDL: 43.9 mg/dL (ref >39.00)
LDL Cholesterol: 47 mg/dL (ref 0–99)
NonHDL: 63.25
Total CHOL/HDL Ratio: 2
Triglycerides: 82 mg/dL (ref 0.0–149.0)
VLDL: 16.4 mg/dL (ref 0.0–40.0)

## 2022-03-10 LAB — HEMOGLOBIN A1C: Hgb A1c MFr Bld: 6.5 % (ref 4.6–6.5)

## 2022-03-10 MED ORDER — ALPRAZOLAM 0.25 MG PO TABS: 0.2500 mg | ORAL_TABLET | Freq: Every day | ORAL | 1 refills | Status: DC | PRN

## 2022-03-10 NOTE — Patient Instructions (Addendum)
A few things to remember from today's visit:  Routine general medical examination at a health care facility  Hyperlipidemia, unspecified hyperlipidemia type - Plan: Comprehensive metabolic panel, Lipid panel  Essential hypertension - Plan: Comprehensive metabolic panel  Anxiety disorder, unspecified type  Prediabetes - Plan: Comprehensive metabolic panel, Hemoglobin A1c  Need for influenza vaccination - Plan: Flu Vaccine QUAD High Dose(Fluad)  If you need refills for medications you take chronically, please call your pharmacy. Do not use My Chart to request refills or for acute issues that need immediate attention. If you send a my chart message, it may take a few days to be addressed, specially if I am not in the office.  Please be sure medication list is accurate. If a new problem present, please set up appointment sooner than planned today.  Preventive Care 61 Years and Older, Female Preventive care refers to lifestyle choices and visits with your health care provider that can promote health and wellness. Preventive care visits are also called wellness exams. What can I expect for my preventive care visit? Counseling Your health care provider may ask you questions about your: Medical history, including: Past medical problems. Family medical history. Pregnancy and menstrual history. History of falls. Current health, including: Memory and ability to understand (cognition). Emotional well-being. Home life and relationship well-being. Sexual activity and sexual health. Lifestyle, including: Alcohol, nicotine or tobacco, and drug use. Access to firearms. Diet, exercise, and sleep habits. Work and work Statistician. Sunscreen use. Safety issues such as seatbelt and bike helmet use. Physical exam Your health care provider will check your: Height and weight. These may be used to calculate your BMI (body mass index). BMI is a measurement that tells if you are at a healthy  weight. Waist circumference. This measures the distance around your waistline. This measurement also tells if you are at a healthy weight and may help predict your risk of certain diseases, such as type 2 diabetes and high blood pressure. Heart rate and blood pressure. Body temperature. Skin for abnormal spots. What immunizations do I need?  Vaccines are usually given at various ages, according to a schedule. Your health care provider will recommend vaccines for you based on your age, medical history, and lifestyle or other factors, such as travel or where you work. What tests do I need? Screening Your health care provider may recommend screening tests for certain conditions. This may include: Lipid and cholesterol levels. Hepatitis C test. Hepatitis B test. HIV (human immunodeficiency virus) test. STI (sexually transmitted infection) testing, if you are at risk. Lung cancer screening. Colorectal cancer screening. Diabetes screening. This is done by checking your blood sugar (glucose) after you have not eaten for a while (fasting). Mammogram. Talk with your health care provider about how often you should have regular mammograms. BRCA-related cancer screening. This may be done if you have a family history of breast, ovarian, tubal, or peritoneal cancers. Bone density scan. This is done to screen for osteoporosis. Talk with your health care provider about your test results, treatment options, and if necessary, the need for more tests. Follow these instructions at home: Eating and drinking  Eat a diet that includes fresh fruits and vegetables, whole grains, lean protein, and low-fat dairy products. Limit your intake of foods with high amounts of sugar, saturated fats, and salt. Take vitamin and mineral supplements as recommended by your health care provider. Do not drink alcohol if your health care provider tells you not to drink. If you drink alcohol: Limit  how much you have to 0-1 drink  a day. Know how much alcohol is in your drink. In the [place] one drink equals one 12 oz bottle of beer (355 mL), one 5 oz glass of wine (148 mL), or one 1 oz glass of hard liquor (44 mL). Lifestyle Brush your teeth every morning and night with fluoride toothpaste. Floss one time each day. Exercise for at least 30 minutes 5 or more days each week. Do not use any products that contain nicotine or tobacco. These products include cigarettes, chewing tobacco, and vaping devices, such as e-cigarettes. If you need help quitting, ask your health care provider. Do not use drugs. If you are sexually active, practice safe sex. Use a condom or other form of protection in order to prevent STIs. Take aspirin only as told by your health care provider. Make sure that you understand how much to take and what form to take. Work with your health care provider to find out whether it is safe and beneficial for you to take aspirin daily. Ask your health care provider if you need to take a cholesterol-lowering medicine (statin). Find healthy ways to manage stress, such as: Meditation, yoga, or listening to music. Journaling. Talking to a trusted person. Spending time with friends and family. Minimize exposure to UV radiation to reduce your risk of skin cancer. Safety Always wear your seat belt while driving or riding in a vehicle. Do not drive: If you have been drinking alcohol. Do not ride with someone who has been drinking. When you are tired or distracted. While texting. If you have been using any mind-altering substances or drugs. Wear a helmet and other protective equipment during sports activities. If you have firearms in your house, make sure you follow all gun safety procedures. What's next? Visit your health care provider once a year for an annual wellness visit. Ask your health care provider how often you should have your eyes and teeth checked. Stay up to date on all vaccines. This information is  not intended to replace advice given to you by your health care provider. Make sure you discuss any questions you have with your health care provider. Document Revised: 10/30/2020 Document Reviewed: 10/30/2020 Elsevier Patient Education  [employer].

## 2022-03-13 ENCOUNTER — Telehealth: Payer: Self-pay | Admitting: *Deleted

## 2022-03-13 ENCOUNTER — Encounter: Payer: Self-pay | Admitting: *Deleted

## 2022-03-13 NOTE — Patient Outreach (Signed)
  Care Coordination   Initial Visit Note   03/13/2022 Name: YUVONNE LANAHAN MRN: [id number] DOB: 1950/06/21  NAFEESAH LAPAGLIA is a 71 y.o. year old female who sees [place], Malka So, MD for primary care. I spoke with  Shirlean Kelly by phone today.  What matters to the patients health and wellness today?  No needs today    Goals Addressed               This Visit's Progress     COMPLETED: No needs (pt-stated)        Care Coordination Interventions: Reviewed medications with patient and discussed adherence with no needed refills Reviewed scheduled/upcoming provider appointments including pending appointments and verified pt completed her AWV for this year Screening for signs and symptoms of depression related to chronic disease state  Assessed social determinant of health barriers          SDOH assessments and interventions completed:  Yes  SDOH Interventions Today    Flowsheet Row Most Recent Value  SDOH Interventions   Food Insecurity Interventions Intervention Not Indicated  Housing Interventions Intervention Not Indicated  Transportation Interventions Intervention Not Indicated  Utilities Interventions Intervention Not Indicated        Care Coordination Interventions Activated:  Yes  Care Coordination Interventions:  Yes, provided   Follow up plan: No further intervention required.   Encounter Outcome:  Pt. Visit Completed   Raina Mina, RN Care Management Coordinator [place] Office [phone]

## 2022-03-13 NOTE — Patient Instructions (Signed)
Visit Information  Thank you for taking time to visit with me today. Please don't hesitate to contact me if I can be of assistance to you.   Following are the goals we discussed today:   Goals Addressed               This Visit's Progress     COMPLETED: No needs (pt-stated)        Care Coordination Interventions: Reviewed medications with patient and discussed adherence with no needed refills Reviewed scheduled/upcoming provider appointments including pending appointments and verified pt completed her AWV for this year Screening for signs and symptoms of depression related to chronic disease state  Assessed social determinant of health barriers          Please call the care guide team at [phone] if you need to cancel or reschedule your appointment.   If you are experiencing a Mental Health or [place] or need someone to talk to, please call the Suicide and Crisis Lifeline: [phone]  Patient verbalizes understanding of instructions and care plan provided today and agrees to view in [place]. Active MyChart status and patient understanding of how to access instructions and care plan via MyChart confirmed with patient.     No further follow up required: No needs  Raina Mina, RN Care Management Coordinator [place] Office [phone]

## 2022-03-16 ENCOUNTER — Other Ambulatory Visit: Payer: Self-pay

## 2022-03-16 DIAGNOSIS — R7303 Prediabetes: Secondary | ICD-10-CM

## 2022-03-18 ENCOUNTER — Encounter: Payer: Self-pay | Admitting: Sports Medicine

## 2022-03-18 ENCOUNTER — Ambulatory Visit: Payer: Medicare PPO | Admitting: Sports Medicine

## 2022-03-18 DIAGNOSIS — M7662 Achilles tendinitis, left leg: Secondary | ICD-10-CM | POA: Diagnosis not present

## 2022-03-18 DIAGNOSIS — M25561 Pain in right knee: Secondary | ICD-10-CM

## 2022-03-18 NOTE — Progress Notes (Signed)
Knee is doing great. No issues.  The foot is bothersome today; she is out of the boot.  Went trick-r-treating with grand kids yesterday and over did it, and now is having burning sensation on bottom of foot. Interested in the shockwave today

## 2022-03-18 NOTE — Progress Notes (Signed)
Glenda [name] - 71 y.o. female MRN [id number]  Date of birth: [birth year]  Office Visit Note: Visit Date: 03/18/2022 PCP: Martinique, Betty G, MD Referred by: Martinique, Betty G, MD  Subjective: No chief complaint on file.  HPI: Glenda [name] is a pleasant 71 y.o. female who presents today for acute on chronic left Achilles tendinopathy and right knee pain.  Right knee pain - at last visit did undergo corticosteroid injection.  This did help improve her pain, she is no longer having any issues of the right knee.  Left achilles - Achilles have been doing better after we did a brief shutdown in the tall cam walker boot and prednisone.  However, she did a lot of walking for Trick-Or-Treat with her grandkids yesterday and this reaggravated things.  Continues with pain over the back part of the Achilles that radiates into the heel.  Taking Tylenol only.  She notes she is walking with a limp because of the pain.  Has been doing the stretching and eccentric exercises, notes here recently when she does more push through the pain and will cause her more tenderness.  Pertinent ROS were reviewed with the patient and found to be negative unless otherwise specified above in HPI.   Assessment & Plan: Visit Diagnoses:  1. Achilles tendinitis, left leg   2. Right knee pain, unspecified chronicity    Plan: Discussed with Jenya I am glad her knee pain is doing much better.  Other than She did have a reaggravation of her subacute Achilles tendinitis/tendinopathy. Previous US did show notable thickening of the tendon with acute inflammation.  We will give her 3 days of rest, stopping the rehab exercises.  I would like her to ice this for 20 minutes 3 times a day over this time.  Unfortunately she cannot take NSAIDs given her Eliquis, but she will continue Tylenol, also may use topical Voltaren gel.  Starting over the weekend, she will slowly start getting back into the Achilles/calf stretching and eccentric's, letting  pain be her guide.  Did place her into 5/16 inch heel lifts into the shoes today to help offload the Achilles.  Trial of extracorporeal shockwave therapy today.  I would like to see her back in a week to reevaluate and consider an additional treatment if finding beneficial.  Could also consider an additional prednisone pack or nitroglycerin patch protocol in the future if not improving. F/u in 1-week for ECSWT and recheck.  Follow-up: Return in about 1 week (around 03/25/2022) for with Dr. Rolena Infante for achilles.   Meds & Orders: No orders of the defined types were placed in this encounter.  No orders of the defined types were placed in this encounter.    Procedures: Procedure: ECSWT Indications:  achilles tendinitis   Procedure Details Consent: Risks of procedure as well as the alternatives and risks of each were explained to the patient.  Verbal consent for procedure obtained. Time Out: Verified patient identification, verified procedure, site was marked, verified correct patient position. The area was cleaned with alcohol swab.     The left achilles tendon was targeted for Extracorporeal shockwave therapy.    Preset: patellar tendinitis Power Level: 100 Frequency: 10 Hz Impulse/cycles: 2500 Head size: Regular   Patient tolerated procedure well without immediate complications.          Clinical History: No specialty comments available.  She reports that she quit smoking about 17 months ago. Her smoking use included cigarettes. She has a 12.50 pack-year  smoking history. She has never used smokeless tobacco.  Recent Labs    03/10/22 0943  HGBA1C 6.5    Objective:   Vital Signs: There were no vitals taken for this visit.  Physical Exam  Gen: Well-appearing, in no acute distress; non-toxic CV: Regular Rate. Well-perfused. Warm.  Resp: Breathing unlabored on room air; no wheezing. Psych: Fluid speech in conversation; appropriate affect; normal thought process Neuro: Sensation  intact throughout. No gross coordination deficits.   Ortho Exam -Nonantalgic gait, full range of motion of bilateral knees.  -Left Achilles/foot: There is notable thickening of the midportion of the Achilles with surrounding soft tissue swelling and warmth.  Intact Thompson squeeze test.  Full range of motion at the ankle, although worsened pain with dorsiflexion.  Walks with antalgic gait.  Negative calcaneal heel squeeze.  Imaging: No results found.  Past Medical/Family/Surgical/Social History: Medications & Allergies reviewed per EMR, new medications updated. Patient Active Problem List   Diagnosis Date Noted   Persistent atrial fibrillation (HCC)    HNP (herniated nucleus pulposus) with myelopathy, cervical 10/28/2020   Paresthesia 09/26/2020   Cerebrovascular accident (CVA) ([place]) 09/02/2020   Weakness 08/29/2020   Right cervical radiculopathy 08/29/2020   Hyperlipidemia 11/24/2019   Anxiety disorder, unspecified 11/25/2007   ALLERGIC RHINITIS 11/25/2007   History of colonic polyps 11/25/2007   Class 1 obesity with body mass index (BMI) of 32.0 to 32.9 in adult 11/24/2006   PANIC DISORDER 11/24/2006   Essential hypertension 11/24/2006   PAROXYSMAL SUPRAVENTRICULAR TACHYCARDIA 11/24/2006   Past Medical History:  Diagnosis Date   Allergic rhinitis    Allergy    Anxiety    Arthritis    Cataract    Heart murmur    History of colonic polyps    History of PSVT (paroxysmal supraventricular tachycardia)    Hypertension    Impaired glucose tolerance    Obesity    PAT (paroxysmal atrial tachycardia)    Pre-diabetes    hx, not currently being monitor for diabetes, diet controlled   Stroke ([place]) 08/2020   Scan revealed stroke in history   Tobacco user    Vertigo    started 3 weeks ago- after fluid trapped in ear   Family History  Problem Relation Age of Onset   Healthy Mother    Dementia Father    Healthy Sister    Healthy Sister    Alzheimer's disease Other    Colon  cancer Neg Hx    Esophageal cancer Neg Hx    Rectal cancer Neg Hx    Stomach cancer Neg Hx    Colon polyps Neg Hx    Past Surgical History:  Procedure Laterality Date   ankle sx-left fx     ANTERIOR CERVICAL DECOMP/DISCECTOMY FUSION N/A 10/28/2020   Procedure: Anterior Cervical Decompression Fusion - Cervical Four-Cervical Five - Cervical Five-Cervical Six - Cervical Six- Cervical Seven;  Surgeon: Kary Kos, MD;  Location: [facility];  Service: Neurosurgery;  Laterality: N/A;  Anterior Cervical Decompression Fusion - Cervical Four-Cervical Five - Cervical Five-Cervical Six - Cervical Six- Cervical Seven   ATRIAL FIBRILLATION ABLATION N/A 09/05/2021   Procedure: ATRIAL FIBRILLATION ABLATION;  Surgeon: Constance Haw, MD;  Location: [place] CV LAB;  Service: Cardiovascular;  Laterality: N/A;   CARDIOVERSION N/A 11/28/2020   Procedure: CARDIOVERSION;  Surgeon: Josue Hector, MD;  Location: [facility] ENDOSCOPY;  Service: Cardiovascular;  Laterality: N/A;   CARDIOVERSION N/A 04/15/2021   Procedure: CARDIOVERSION;  Surgeon: [name], MD;  Location:  [place] ENDOSCOPY;  Service: Cardiovascular;  Laterality: N/A;   CATARACT EXTRACTION     both eyes   CHOLECYSTECTOMY N/A 01/11/2018   Procedure: LAPAROSCOPIC CHOLECYSTECTOMY WITH INTRAOPERATIVE CHOLANGIOGRAM ERAS PATHWAY;  Surgeon: Alphonsa Overall, MD;  Location: [place];  Service: General;  Laterality: N/A;   COLONOSCOPY  04/23/2005   DIAGNOSTIC LAPAROSCOPY     EYE SURGERY     foot sx     with the ankle surgery   POLYPECTOMY     Social History   Occupational History   Occupation: Retired  Tobacco Use   Smoking status: Former    Packs/day: 0.25    Years: 50.00    Total pack years: 12.50    Types: Cigarettes    Quit date: 10/17/2020    Years since quitting: 1.4   Smokeless tobacco: Never  Vaping Use   Vaping Use: Never used  Substance and Sexual Activity   Alcohol use: Yes    Alcohol/week: 2.0 standard drinks of alcohol    Types: 2  Standard drinks or equivalent per week    Comment: weekends   Drug use: No   Sexual activity: Yes

## 2022-03-25 ENCOUNTER — Encounter: Payer: Self-pay | Admitting: Sports Medicine

## 2022-03-25 ENCOUNTER — Ambulatory Visit: Payer: Medicare PPO | Admitting: Sports Medicine

## 2022-03-25 DIAGNOSIS — M7662 Achilles tendinitis, left leg: Secondary | ICD-10-CM | POA: Diagnosis not present

## 2022-03-25 NOTE — Progress Notes (Signed)
She is doing well; the first treatment did hurt, but feels like it helped some

## 2022-03-25 NOTE — Progress Notes (Signed)
Glenda [name] - 71 y.o. female MRN [id number]  Date of birth: 08-31-1950  Office Visit Note: Visit Date: 03/25/2022 PCP: Martinique, Betty G, MD Referred by: Martinique, Betty G, MD  Subjective: Chief Complaint  Patient presents with   Left Leg - Pain   HPI: Glenda [name] is a pleasant 71 y.o. female who presents today for follow-up of left achilles tendinitis/tendinopathy.   She did find some benefit from the first shockwave treatment.  She did take a few days of rest, but then started back on the rehab exercises over the weekend.  She states some of the exercises give her some pain.  Has tried topical Voltaren gel in the past without much relief.  She is interested in continuing some shockwave therapy.  Pertinent ROS were reviewed with the patient and found to be negative unless otherwise specified above in HPI.   Assessment & Plan: Visit Diagnoses:  1. Achilles tendinitis, left leg    Plan: She has gotten some benefit from shockwave therapy, we will bring her back for weekly treatments, will schedule for next week.  I would like to get her into formalized physical therapy to help guide some of her rehab treatment, as some of her exercises at home caused her pain.  Hopefully they will be able to help transition her to the appropriate exercises she may do at home then as well.  May continue with a heel lift to help offload the Achilles.  At some point, if not improving as we would expect, may consider x-rays of the ankle to evaluate for calcaneal spurring vs. Haglund's. Follow-up in 1 week.  Follow-up: Return in about 1 week (around 04/01/2022) for for shockwave (ECSWT) - upfront payment .   Meds & Orders: No orders of the defined types were placed in this encounter.   Orders Placed This Encounter  Procedures   Ambulatory referral to Physical Therapy     Procedures: Procedure: ECSWT Indications:  achilles tendinitis, left   Procedure Details Consent: Risks of procedure as well as the  alternatives and risks of each were explained to the patient.  Verbal consent for procedure obtained. Time Out: Verified patient identification, verified procedure, site was marked, verified correct patient position. The area was cleaned with alcohol swab.     The left achilles tendon was targeted for Extracorporeal shockwave therapy.    Preset: patellar tendinitis Power Level: 110 mJ Frequency: 10 -> 11 Hz Impulse/cycles: 2800 Head size: Regular   Patient tolerated procedure well without immediate complications.         Clinical History: No specialty comments available.  She reports that she quit smoking about 17 months ago. Her smoking use included cigarettes. She has a 12.50 pack-year smoking history. She has never used smokeless tobacco.  Recent Labs    03/10/22 0943  HGBA1C 6.5    Objective:   Vital Signs: There were no vitals taken for this visit.  Physical Exam  Gen: Well-appearing, in no acute distress; non-toxic CV: Regular Rate. Well-perfused. Warm.  Resp: Breathing unlabored on room air; no wheezing. Psych: Fluid speech in conversation; appropriate affect; normal thought process Neuro: Sensation intact throughout. No gross coordination deficits.   Ortho Exam -Left Achilles/foot: Obvious thickening of the midportion of the Achilles with some soft tissue swelling, no warmth or erythema today.  Intact Thompson squeeze test.  Full range of motion at the ankle.  Improved gait although still slightly guarded on the left.  Negative calcaneal heel squeeze.  Imaging:  No results found.  Past Medical/Family/Surgical/Social History: Medications & Allergies reviewed per EMR, new medications updated. Patient Active Problem List   Diagnosis Date Noted   Persistent atrial fibrillation (HCC)    HNP (herniated nucleus pulposus) with myelopathy, cervical 10/28/2020   Paresthesia 09/26/2020   Cerebrovascular accident (CVA) ([place]) 09/02/2020   Weakness 08/29/2020   Right  cervical radiculopathy 08/29/2020   Hyperlipidemia 11/24/2019   Anxiety disorder, unspecified 11/25/2007   ALLERGIC RHINITIS 11/25/2007   History of colonic polyps 11/25/2007   Class 1 obesity with body mass index (BMI) of 32.0 to 32.9 in adult 11/24/2006   PANIC DISORDER 11/24/2006   Essential hypertension 11/24/2006   PAROXYSMAL SUPRAVENTRICULAR TACHYCARDIA 11/24/2006   Past Medical History:  Diagnosis Date   Allergic rhinitis    Allergy    Anxiety    Arthritis    Cataract    Heart murmur    History of colonic polyps    History of PSVT (paroxysmal supraventricular tachycardia)    Hypertension    Impaired glucose tolerance    Obesity    PAT (paroxysmal atrial tachycardia)    Pre-diabetes    hx, not currently being monitor for diabetes, diet controlled   Stroke ([place]) 08/2020   Scan revealed stroke in history   Tobacco user    Vertigo    started 3 weeks ago- after fluid trapped in ear   Family History  Problem Relation Age of Onset   Healthy Mother    Dementia Father    Healthy Sister    Healthy Sister    Alzheimer's disease Other    Colon cancer Neg Hx    Esophageal cancer Neg Hx    Rectal cancer Neg Hx    Stomach cancer Neg Hx    Colon polyps Neg Hx    Past Surgical History:  Procedure Laterality Date   ankle sx-left fx     ANTERIOR CERVICAL DECOMP/DISCECTOMY FUSION N/A 10/28/2020   Procedure: Anterior Cervical Decompression Fusion - Cervical Four-Cervical Five - Cervical Five-Cervical Six - Cervical Six- Cervical Seven;  Surgeon: Kary Kos, MD;  Location: [place];  Service: Neurosurgery;  Laterality: N/A;  Anterior Cervical Decompression Fusion - Cervical Four-Cervical Five - Cervical Five-Cervical Six - Cervical Six- Cervical Seven   ATRIAL FIBRILLATION ABLATION N/A 09/05/2021   Procedure: ATRIAL FIBRILLATION ABLATION;  Surgeon: Constance Haw, MD;  Location: [place] CV LAB;  Service: Cardiovascular;  Laterality: N/A;   CARDIOVERSION N/A 11/28/2020    Procedure: CARDIOVERSION;  Surgeon: Josue Hector, MD;  Location: [place];  Service: Cardiovascular;  Laterality: N/A;   CARDIOVERSION N/A 04/15/2021   Procedure: CARDIOVERSION;  Surgeon: [name], MD;  Location: [place];  Service: Cardiovascular;  Laterality: N/A;   CATARACT EXTRACTION     both eyes   CHOLECYSTECTOMY N/A 01/11/2018   Procedure: LAPAROSCOPIC CHOLECYSTECTOMY WITH INTRAOPERATIVE CHOLANGIOGRAM ERAS PATHWAY;  Surgeon: Alphonsa Overall, MD;  Location: [place];  Service: General;  Laterality: N/A;   COLONOSCOPY  04/23/2005   DIAGNOSTIC LAPAROSCOPY     EYE SURGERY     foot sx     with the ankle surgery   POLYPECTOMY     Social History   Occupational History   Occupation: Retired  Tobacco Use   Smoking status: Former    Packs/day: 0.25    Years: 50.00    Total pack years: 12.50    Types: Cigarettes    Quit date: 10/17/2020    Years since quitting: 1.4   Smokeless tobacco:  Never  Vaping Use   Vaping Use: Never used  Substance and Sexual Activity   Alcohol use: Yes    Alcohol/week: 2.0 standard drinks of alcohol    Types: 2 Standard drinks or equivalent per week    Comment: weekends   Drug use: No   Sexual activity: Yes

## 2022-03-30 ENCOUNTER — Other Ambulatory Visit (INDEPENDENT_AMBULATORY_CARE_PROVIDER_SITE_OTHER): Payer: Medicare PPO

## 2022-03-30 ENCOUNTER — Encounter: Payer: Self-pay | Admitting: Family Medicine

## 2022-03-30 DIAGNOSIS — R7303 Prediabetes: Secondary | ICD-10-CM | POA: Diagnosis not present

## 2022-03-30 LAB — HEMOGLOBIN A1C: Hgb A1c MFr Bld: 6.2 % (ref 4.6–6.5)

## 2022-03-30 LAB — GLUCOSE, RANDOM: Glucose, Bld: 99 mg/dL (ref 70–99)

## 2022-03-31 ENCOUNTER — Telehealth (INDEPENDENT_AMBULATORY_CARE_PROVIDER_SITE_OTHER): Payer: Medicare PPO | Admitting: Family Medicine

## 2022-03-31 DIAGNOSIS — U071 COVID-19: Secondary | ICD-10-CM | POA: Diagnosis not present

## 2022-03-31 MED ORDER — MOLNUPIRAVIR EUA 200MG CAPSULE: 4.0000 | ORAL_CAPSULE | Freq: Two times a day (BID) | ORAL | 0 refills | Status: AC

## 2022-03-31 MED ORDER — BENZONATATE 100 MG PO CAPS: ORAL_CAPSULE | ORAL | 0 refills | Status: DC

## 2022-03-31 NOTE — Progress Notes (Signed)
Virtual Visit via Video Note  I connected with Glenda [name]  on 03/31/22 at 11:20 AM EST by a video enabled telemedicine application and verified that I am speaking with the correct person using two identifiers.  Location patient: [place] Location provider:work or home office Persons participating in the virtual visit: patient, provider  I discussed the limitations and requested verbal permission for telemedicine visit. The patient expressed understanding and agreed to proceed.   HPI:  Acute telemedicine visit for Covid: -Onset: 2 days ago, tested positive for covid yesterday -Symptoms include: fever, headache, scratchy throat, runny nose, cough -Denies:CP, SOB, NVD, malaise -reports headache and fever improving today- resolved today -Has tried: tylenol -Pertinent past medical history: see below, has not had covid before, denies immune compromise, renal or liver disease -GFR 85 a few weeks ago on labs -Pertinent medication allergies: Allergies  Allergen Reactions   Amoxicillin Rash and Other (See Comments)    Has patient had a PCN reaction causing immediate rash, facial/tongue/throat swelling, SOB or lightheadedness with hypotension: No HAS PT DEVELOPED SEVERE RASH INVOLVING MUCUS MEMBRANES or SKIN NECROSIS: #  #  YES  #  #  Has patient had a PCN reaction that required hospitalization: No Has patient had a PCN reaction occurring within the last 10 years: No    Erythromycin Other (See Comments)    Upset stomach  -COVID-19 vaccine status: has had multiple doses, but last one about 1 year ago Immunization History  Administered Date(s) Administered   Fluad Quad(high Dose 65+) 01/29/2020, 02/18/2021, 03/10/2022   Influenza Split 03/13/2011, 03/18/2012   Influenza Whole 02/15/2006, 04/19/2007   Influenza, High Dose Seasonal PF 04/02/2016, 03/17/2017, 03/11/2018   Influenza,inj,Quad PF,6+ Mos 03/31/2013, 04/06/2014, 03/22/2015, 02/11/2019   PFIZER(Purple Top)SARS-COV-2 Vaccination 06/23/2019,  07/19/2019, 02/15/2020   Pfizer Covid-19 Vaccine Bivalent Booster 86yr & up 03/04/2021   Pneumococcal Conjugate-13 05/01/2016   Pneumococcal Polysaccharide-23 06/07/2017   Tdap 03/13/2011     ROS: See pertinent positives and negatives per HPI.  Past Medical History:  Diagnosis Date   Allergic rhinitis    Allergy    Anxiety    Arthritis    Cataract    Heart murmur    History of colonic polyps    History of PSVT (paroxysmal supraventricular tachycardia)    Hypertension    Impaired glucose tolerance    Obesity    PAT (paroxysmal atrial tachycardia)    Pre-diabetes    hx, not currently being monitor for diabetes, diet controlled   Stroke ([place] 08/2020   Scan revealed stroke in history   Tobacco user    Vertigo    started 3 weeks ago- after fluid trapped in ear    Past Surgical History:  Procedure Laterality Date   ankle sx-left fx     ANTERIOR CERVICAL DECOMP/DISCECTOMY FUSION N/A 10/28/2020   Procedure: Anterior Cervical Decompression Fusion - Cervical Four-Cervical Five - Cervical Five-Cervical Six - Cervical Six- Cervical Seven;  Surgeon: CKary Kos MD;  [place]  Service: Neurosurgery;  Laterality: N/A;  Anterior Cervical Decompression Fusion - Cervical Four-Cervical Five - Cervical Five-Cervical Six - Cervical Six- Cervical Seven   ATRIAL FIBRILLATION ABLATION N/A 09/05/2021   Procedure: ATRIAL FIBRILLATION ABLATION;  Surgeon: CConstance Haw MD;  [facility];  Service: Cardiovascular;  Laterality: N/A;   CARDIOVERSION N/A 11/28/2020   Procedure: CARDIOVERSION;  Surgeon: NJosue Hector MD;  [facility];  Service: Cardiovascular;  Laterality: N/A;   CARDIOVERSION N/A 04/15/2021   Procedure: CARDIOVERSION;  Surgeon: [name], MD;  Location: [place];  Service: Cardiovascular;  Laterality: N/A;   CATARACT EXTRACTION     both eyes   CHOLECYSTECTOMY N/A 01/11/2018   Procedure: LAPAROSCOPIC CHOLECYSTECTOMY WITH  INTRAOPERATIVE CHOLANGIOGRAM ERAS PATHWAY;  Surgeon: Alphonsa Overall, MD;  Location: [place];  Service: General;  Laterality: N/A;   COLONOSCOPY  04/23/2005   DIAGNOSTIC LAPAROSCOPY     EYE SURGERY     foot sx     with the ankle surgery   POLYPECTOMY       Current Outpatient Medications:    benzonatate (TESSALON PERLES) 100 MG capsule, 1-2 capsule up to twice daily as needed for cough, Disp: 30 capsule, Rfl: 0   molnupiravir EUA (LAGEVRIO) 200 mg CAPS capsule, Take 4 capsules (800 mg total) by mouth 2 (two) times daily for 5 days., Disp: 40 capsule, Rfl: 0   acetaminophen (TYLENOL) 500 MG tablet, Take 1,000 mg by mouth 2 (two) times daily as needed for moderate pain., Disp: , Rfl:    ALPRAZolam (XANAX) 0.25 MG tablet, Take 1 tablet (0.25 mg total) by mouth daily as needed. for anxiety, Disp: 30 tablet, Rfl: 1   diclofenac Sodium (VOLTAREN) 1 % GEL, Apply 4 g topically 4 (four) times daily., Disp: 30 g, Rfl: 0   diphenhydramine-acetaminophen (TYLENOL PM) 25-500 MG TABS tablet, Take 1 tablet by mouth at bedtime as needed (sleep)., Disp: , Rfl:    ELIQUIS 5 MG TABS tablet, TAKE 1 TABLET BY MOUTH TWICE A DAY, Disp: 180 tablet, Rfl: 1   hydrochlorothiazide (HYDRODIURIL) 25 MG tablet, Take 0.5 tablets (12.5 mg total) by mouth daily., Disp: 45 tablet, Rfl: 3   metoprolol succinate (TOPROL XL) 25 MG 24 hr tablet, Take 1 tablet (25 mg total) by mouth at bedtime., Disp: , Rfl:    PARoxetine (PAXIL) 40 MG tablet, TAKE 1 TABLET BY MOUTH EVERY DAY, Disp: 90 tablet, Rfl: 2   rosuvastatin (CRESTOR) 10 MG tablet, TAKE 1 TABLET BY MOUTH EVERY DAY, Disp: 90 tablet, Rfl: 1  EXAM:  VITALS per patient if applicable:  GENERAL: alert, oriented, appears well and in no acute distress  HEENT: atraumatic, conjunttiva clear, no obvious abnormalities on inspection of external nose and ears  NECK: normal movements of the head and neck  LUNGS: on inspection no signs of respiratory distress, breathing rate appears  normal, no obvious gross SOB, gasping or wheezing  CV: no obvious cyanosis  MS: moves all visible extremities without noticeable abnormality  PSYCH/NEURO: pleasant and cooperative, no obvious depression or anxiety, speech and thought processing grossly intact  ASSESSMENT AND PLAN:  Discussed the following assessment and plan:  COVID-19   Discussed treatment options, side effect and risk of drug interactions, ideal treatment window, potential complications, isolation and precautions for COVID-19.  Discussed possibility of rebound with or without antivirals. Checked for/reviewed last GFR - listed in HPI if available. After lengthy discussion, the patient opted for treatment with Legevrio due to being higher risk for complications of covid or severe disease and other factors. Discussed EUA status of this drug and the fact that there is preliminary limited knowledge of risks/interactions/side effects per EUA document vs possible benefits and precautions. This information was shared with patient during the visit and also was provided in patient instructions. Also, advised that patient discuss risks/interactions and use with pharmacist/treatment team as well.  The patient did want a prescription for cough, Tessalon Rx sent.  Other symptomatic care measures summarized in patient instructions.  Advised to  seek prompt virtual visit or in person care if worsening, new symptoms arise, or if is not improving with treatment as expected per our conversation of expected course. Discussed options for follow up care. Did let this patient know that I do telemedicine on Tuesdays and Thursdays for [facility] and those are the days I am logged into the system. Advised to schedule follow up visit with PCP, [facility] virtual visits or [facility] if any further questions or concerns to avoid delays in care.   I discussed the assessment and treatment plan with the patient. The patient was provided an opportunity to ask questions  and all were answered. The patient agreed with the plan and demonstrated an understanding of the instructions.     Lucretia Kern, DO

## 2022-03-31 NOTE — Patient Instructions (Signed)
HOME CARE TIPS:   -I sent the medication(s) we discussed to your pharmacy: Meds ordered this encounter  Medications   molnupiravir EUA (LAGEVRIO) 200 mg CAPS capsule    Sig: Take 4 capsules (800 mg total) by mouth 2 (two) times daily for 5 days.    Dispense:  40 capsule    Refill:  0   benzonatate (TESSALON PERLES) 100 MG capsule    Sig: 1-2 capsule up to twice daily as needed for cough    Dispense:  30 capsule    Refill:  0     -I sent in the [facility] treatment or referral you requested per our discussion. Please see the information provided below and discuss further with the pharmacist/treatment team.    -there is a chance of rebound illness with covid after improving. This can happen whether or not you take an antiviral treatment. If you become sick again with covid after getting better, please schedule a follow up virtual visit and isolate again.  -can use tylenol  if needed for fevers, aches and pains per instructions  -nasal saline sinus rinses twice daily  -stay hydrated, drink plenty of fluids and eat small healthy meals - avoid dairy  -can take 1000 IU (29mg) Vit D3 and 100-500 mg of Vit C daily per instructions  -follow up with your doctor in 2-3 days unless improving and feeling better  -stay home while sick, except to seek medical care. If you have COVID19, you will likely be contagious for 7-10 days. Flu or Influenza is likely contagious for about 7 days. Other respiratory viral infections remain contagious for 5-10+ days depending on the virus and many other factors. Wear a good mask that fits snugly (such as N95 or KN95) if around others to reduce the risk of transmission.  It was nice to meet you today, and I really hope you are feeling better soon. I help [facility] with telemedicine visits on Tuesdays and Thursdays and am happy to help if you need a follow up virtual visit on those days. Otherwise, if you have any concerns or questions following this visit  please schedule a follow up visit with your Primary Care doctor or seek care at a local urgent care clinic to avoid delays in care.    Seek in person care or schedule a follow up video visit promptly if your symptoms worsen, new concerns arise or you are not improving with treatment. Call 911 and/or seek emergency care if your symptoms are severe or life threatening.   PLEASE SEE THE FOLLOWING LINK FOR THE MOST UPDATED INFORMATION ABOUT LAGEVRIO:  [email or web address]      Fact Sheet for Patients And Caregivers Emergency Use Authorization (EUA) Of LAGEVRIOT (molnupiravir) capsules For Coronavirus Disease 2019 (COVID-19)  What is the most important information I should know about LAGEVRIO? LAGEVRIO may cause serious side effects, including: ? LAGEVRIO may cause harm to your unborn baby. It is not known if LAGEVRIO will harm your baby if you take LAGEVRIO during pregnancy. o LAGEVRIO is not recommended for use in pregnancy. o LAGEVRIO has not been studied in pregnancy. LAGEVRIO was studied in pregnant animals only. When LAGEVRIO was given to pregnant animals, LAGEVRIO caused harm to their unborn babies. o You and your healthcare provider may decide that you should take LAGEVRIO during pregnancy if there are no other COVID-19 treatment options approved or authorized by the FDA that are accessible or clinically appropriate for you. o If you and your healthcare provider decide that  you should take LAGEVRIO during pregnancy, you and your healthcare provider should discuss the known and potential benefits and the potential risks of taking LAGEVRIO during pregnancy. For individuals who are able to become pregnant: ? You should use a reliable method of birth control (contraception) consistently and correctly during treatment with LAGEVRIO and for 4 days after the last dose of LAGEVRIO. Talk to your healthcare provider about reliable birth control methods. ? Before starting treatment  with [facility] your healthcare provider may do a pregnancy test to see if you are pregnant before starting treatment with LAGEVRIO. ? Tell your healthcare provider right away if you become pregnant or think you may be pregnant during treatment with LAGEVRIO. Pregnancy Surveillance Program: ? There is a pregnancy surveillance program for individuals who take LAGEVRIO during pregnancy. The purpose of this program is to collect information about the health of you and your baby. Talk to your healthcare provider about how to take part in this program. ? If you take LAGEVRIO during pregnancy and you agree to participate in the pregnancy surveillance program and allow your healthcare provider to share your information with [place], then your healthcare provider will report your use of [place] during pregnancy to [facility]. by calling [phone] or [email or web address]. For individuals who are sexually active with partners who are able to become pregnant: ? It is not known if LAGEVRIO can affect sperm. While the risk is regarded as low, animal studies to fully assess the potential for LAGEVRIO to affect the babies of males treated with LAGEVRIO have not been completed. A reliable method of birth control (contraception) should be used consistently and correctly during treatment with LAGEVRIO and for at least 3 months after the last dose. The risk to sperm beyond 3 months is not known. Studies to understand the risk to sperm beyond 3 months are ongoing. Talk to your healthcare provider about reliable birth control methods. Talk to your healthcare provider if you have questions or concerns about how LAGEVRIO may affect sperm. You are being given this fact sheet because your healthcare provider believes it is necessary to provide you with LAGEVRIO for the treatment of adults with mild-to-moderate coronavirus disease 2019 (COVID-19) with positive results of direct  SARS-CoV-2 viral testing, and who are at high risk for progression to severe COVID-19 including hospitalization or death, and for whom other COVID-19 treatment options approved or authorized by the FDA are not accessible or clinically appropriate. The U.S. Food and Drug Administration (FDA) has issued an Emergency Use Authorization (EUA) to make LAGEVRIO available during the COVID-19 pandemic (for more details about an EUA please see "What is an Emergency Use Authorization?" at the end of this document). LAGEVRIO is not an FDA-approved medicine in the [place]. Read this Fact Sheet for information about LAGEVRIO. Talk to your healthcare provider about your options if you have any questions. It is your choice to take LAGEVRIO.  What is COVID-19? COVID-19 is caused by a virus called a coronavirus. You can get COVID-19 through close contact with another person who has the virus. COVID-19 illnesses have ranged from very mild-to-severe, including illness resulting in death. While information so far suggests that most COVID-19 illness is mild, serious illness can happen and may cause some of your other medical conditions to become worse. Older people and people of all ages with severe, long lasting (chronic) medical conditions like heart disease, lung disease and diabetes, for example seem to be at higher risk of  being hospitalized for COVID-19.  What is LAGEVRIO? LAGEVRIO is an investigational medicine used to treat mild-to-moderate COVID-19 in adults: ? with positive results of direct SARS-CoV-2 viral testing, and ? who are at high risk for progression to severe COVID-19 including hospitalization or death, and for whom other COVID-19 treatment options approved or authorized by the FDA are not accessible or clinically appropriate. The FDA has authorized the emergency use of LAGEVRIO for the treatment of mild-tomoderate COVID-19 in adults under an EUA. For more information on EUA, see the  "What is an Emergency Use Authorization (EUA)?" section at the end of this Fact Sheet. LAGEVRIO is not authorized: ? for use in people less than 16 years of age. ? for prevention of COVID-19. ? for people needing hospitalization for COVID-19. ? for use for longer than 5 consecutive days.  What should I tell my healthcare provider before I take LAGEVRIO? Tell your healthcare provider if you: ? Have any allergies ? Are breastfeeding or plan to breastfeed ? Have any serious illnesses ? Are taking any medicines (prescription, over-the-counter, vitamins, or herbal products).  How do I take LAGEVRIO? ? Take LAGEVRIO exactly as your healthcare provider tells you to take it. ? Take 4 capsules of LAGEVRIO every 12 hours (for example, at 8 am and at 8 pm) ? Take LAGEVRIO for 5 days. It is important that you complete the full 5 days of treatment with LAGEVRIO. Do not stop taking LAGEVRIO before you complete the full 5 days of treatment, even if you feel better. ? Take LAGEVRIO with or without food. ? You should stay in isolation for as long as your healthcare provider tells you to. Talk to your healthcare provider if you are not sure about how to properly isolate while you have COVID-19. ? Swallow LAGEVRIO capsules whole. Do not open, break, or crush the capsules. If you cannot swallow capsules whole, tell your healthcare provider. ? What to do if you miss a dose: o If it has been less than 10 hours since the missed dose, take it as soon as you remember o If it has been more than 10 hours since the missed dose, skip the missed dose and take your dose at the next scheduled time. ? Do not double the dose of LAGEVRIO to make up for a missed dose.  What are the important possible side effects of LAGEVRIO? ? See, "What is the most important information I should know about LAGEVRIO?" ? Allergic Reactions. Allergic reactions can happen in people taking LAGEVRIO, even after only 1 dose. Stop taking  LAGEVRIO and call your healthcare provider right away if you get any of the following symptoms of an allergic reaction: o hives o rapid heartbeat o trouble swallowing or breathing o swelling of the mouth, lips, or face o throat tightness o hoarseness o skin rash The most common side effects of LAGEVRIO are: ? diarrhea ? nausea ? dizziness These are not all the possible side effects of LAGEVRIO. Not many people have taken LAGEVRIO. Serious and unexpected side effects may happen. This medicine is still being studied, so it is possible that all of the risks are not known at this time.  What other treatment choices are there?  Veklury (remdesivir) is FDA-approved as an intravenous (IV) infusion for the treatment of mildto-moderate BPZWC-58 in certain adults and children. Talk with your doctor to see if Marijean Heath is appropriate for you. Like LAGEVRIO, FDA may also allow for the emergency use of other medicines to treat  people with COVID-19. Go to LacrosseProperties.si for more information. It is your choice to be treated or not to be treated with LAGEVRIO. Should you decide not to take it, it will not change your standard medical care.  What if I am breastfeeding? Breastfeeding is not recommended during treatment with LAGEVRIO and for 4 days after the last dose of LAGEVRIO. If you are breastfeeding or plan to breastfeed, talk to your healthcare provider about your options and specific situation before taking LAGEVRIO.  How do I report side effects with LAGEVRIO? Contact your healthcare provider if you have any side effects that bother you or do not go away. Report side effects to FDA MedWatch at SmoothHits.hu or call 1-800-FDA-1088 (1- 580-150-6289).  How should I store [place]? ? Store LAGEVRIO capsules at room temperature between 75F to 29F (20C to 25C). ? Keep LAGEVRIO  and all medicines out of the reach of children and pets. How can I learn more about COVID-19? ? Ask your healthcare provider. ? Visit SeekRooms.co.uk ? Contact your local or state public health department. ? Call [place] at [phone] (toll free in the [place] ? Visit [email or web address].com  What Is an Emergency Use Authorization (EUA)? The [place] FDA has made [place] available under an emergency access mechanism called an Emergency Use Authorization (EUA) The EUA is supported by a Presenter, broadcasting Health and Human Service (HHS) declaration that circumstances exist to justify emergency use of drugs and biological products during the COVID-19 pandemic. LAGEVRIO for the treatment of mild-to-moderate COVID-19 in adults with positive results of direct SARS-CoV-2 viral testing, who are at high risk for progression to severe COVID-19, including hospitalization or death, and for whom alternative COVID-19 treatment options approved or authorized by FDA are not accessible or clinically appropriate, has not undergone the same type of review as an FDA-approved product. In issuing an EUA under the ELTRV-20 public health emergency, the FDA has determined, among other things, that based on the total amount of scientific evidence available including data from adequate and well-controlled clinical trials, if available, it is reasonable to believe that the product may be effective for diagnosing, treating, or preventing COVID-19, or a serious or life-threatening disease or condition caused by COVID-19; that the known and potential benefits of the product, when used to diagnose, treat, or prevent such disease or condition, outweigh the known and potential risks of such product; and that there are no adequate, approved, and available alternatives.  All of these criteria must be met to allow for the product to be used in the treatment of patients during the COVID-19 pandemic. The EUA for  LAGEVRIO is in effect for the duration of the COVID-19 declaration justifying emergency use of LAGEVRIO, unless terminated or revoked (after which LAGEVRIO may no longer be used under the EUA). For patent information: http://rogers.info/ Copyright  2021-2022 [employer] [place], [place] and its affiliates. All rights reserved. [id number] Revised: March 2022

## 2022-04-01 ENCOUNTER — Ambulatory Visit: Payer: Medicare PPO | Admitting: Rehabilitative and Restorative Service Providers"

## 2022-04-01 ENCOUNTER — Ambulatory Visit: Payer: Medicare PPO | Admitting: Sports Medicine

## 2022-04-14 ENCOUNTER — Ambulatory Visit: Payer: Medicare PPO | Admitting: Sports Medicine

## 2022-04-14 ENCOUNTER — Encounter: Payer: Self-pay | Admitting: Sports Medicine

## 2022-04-14 DIAGNOSIS — M7662 Achilles tendinitis, left leg: Secondary | ICD-10-CM

## 2022-04-14 NOTE — Progress Notes (Signed)
   Procedure Note  Patient: Glenda [name]             Date of Birth: [birth year]           MRN: [id number]             Visit Date: 04/14/2022  HPI: Glenda [name] is a pleasant 71 y.o. female who presents today for follow-up of left achilles tendinitis/tendinopathy.  She presents for shockwave therapy #3 today.  Procedures: Visit Diagnoses:  1. Achilles tendinitis, left leg    Procedure: ECSWT Indications:  achilles tendinitis, left   Procedure Details Consent: Risks of procedure as well as the alternatives and risks of each were explained to the patient.  Verbal consent for procedure obtained. Time Out: Verified patient identification, verified procedure, site was marked, verified correct patient position. The area was cleaned with alcohol swab.     The left achilles tendon was targeted for Extracorporeal shockwave therapy.    Preset: patellar tendinitis Power Level: 110 mJ Frequency: 12 Hz Impulse/cycles: 2500 (500 then at calcaneal insertion) Head size: Regular   Patient tolerated procedure well without immediate complications.  -Follow-up in 1 week for repeat extracorporeal shockwave treatment, we should consider between 5-7 sessions -Recommend continued follow-up with physical therapy to aid in home rehab and treatment modalities for her Achilles tendinopathy  Elba Barman, DO [place]  This note was dictated using Dragon naturally speaking software and may contain errors in syntax, spelling, or content which have not been identified prior to signing this note.

## 2022-04-14 NOTE — Progress Notes (Signed)
Doing better; she had covid so she has rested quite a bit and seemed to have helped with the pain

## 2022-04-21 ENCOUNTER — Ambulatory Visit: Payer: Self-pay | Admitting: Sports Medicine

## 2022-04-21 ENCOUNTER — Encounter: Payer: Self-pay | Admitting: Sports Medicine

## 2022-04-21 DIAGNOSIS — M7662 Achilles tendinitis, left leg: Secondary | ICD-10-CM

## 2022-04-21 NOTE — Progress Notes (Signed)
   Procedure Note  HPI: Glenda [name] is a pleasant 71 y.o. female who presents today for follow-up of left achilles tendinitis/tendinopathy.  She presents for shockwave therapy #4 today.   Patient: Glenda [name]             Date of Birth: Dec 12, 1950           MRN: [id number]             Visit Date: 04/21/2022  Procedures: Visit Diagnoses:  1. Achilles tendinitis, left leg    Procedure Details Consent: Risks of procedure as well as the alternatives and risks of each were explained to the patient.  Verbal consent for procedure obtained. Time Out: Verified patient identification, verified procedure, site was marked, verified correct patient position. The area was cleaned with alcohol swab.     The left achilles tendon was targeted for Extracorporeal shockwave therapy.    Preset: patellar tendinitis Power Level: 110 mJ Frequency: 12 Hz Impulse/cycles: 2500 Head size: Regular   Patient tolerated procedure well without immediate complications.   -Follow-up in 1 week for repeat extracorporeal shockwave treatment, we will plan for somewhere between 5-7 sessions -Recommend continued follow-up with physical therapy -> she has an appt this upcoming Thursday. Appreciate guidance on non-painful HEP and guidance on walking/activity working through pain.   Elba Barman, DO Primary Care Sports Medicine Physician  [place]   This note was dictated using Dragon naturally speaking software and may contain errors in syntax, spelling, or content which have not been identified prior to signing this note

## 2022-04-21 NOTE — Progress Notes (Signed)
Doing good; does state she notices some improvement with these treatments  She is still getting increased pain the more she walks

## 2022-04-23 ENCOUNTER — Other Ambulatory Visit: Payer: Self-pay

## 2022-04-23 ENCOUNTER — Ambulatory Visit: Payer: Medicare PPO | Admitting: Physical Therapy

## 2022-04-23 DIAGNOSIS — M25572 Pain in left ankle and joints of left foot: Secondary | ICD-10-CM

## 2022-04-23 DIAGNOSIS — M6281 Muscle weakness (generalized): Secondary | ICD-10-CM | POA: Diagnosis not present

## 2022-04-23 DIAGNOSIS — M25675 Stiffness of left foot, not elsewhere classified: Secondary | ICD-10-CM | POA: Diagnosis not present

## 2022-04-23 NOTE — Therapy (Signed)
OUTPATIENT PHYSICAL THERAPY LOWER EXTREMITY EVALUATION   Patient Name: Glenda [name] MRN: [id number] DOB:16-Jul-1950, 71 y.o., female Today's Date: 04/23/2022  END OF SESSION:  PT End of Session - 04/23/22 1113     Visit Number 1    Number of Visits 6    Date for PT Re-Evaluation 06/04/22    Authorization Type [employer] $20 copay    Progress Note Due on Visit 10    PT Start Time 1015    PT Stop Time 1102    PT Time Calculation (min) 47 min    Activity Tolerance Patient tolerated treatment well    Behavior During Therapy WFL for tasks assessed/performed             Past Medical History:  Diagnosis Date   Allergic rhinitis    Allergy    Anxiety    Arthritis    Cataract    Heart murmur    History of colonic polyps    History of PSVT (paroxysmal supraventricular tachycardia)    Hypertension    Impaired glucose tolerance    Obesity    PAT (paroxysmal atrial tachycardia)    Pre-diabetes    hx, not currently being monitor for diabetes, diet controlled   Stroke ([place]) 08/2020   Scan revealed stroke in history   Tobacco user    Vertigo    started 3 weeks ago- after fluid trapped in ear   Past Surgical History:  Procedure Laterality Date   ankle sx-left fx     ANTERIOR CERVICAL DECOMP/DISCECTOMY FUSION N/A 10/28/2020   Procedure: Anterior Cervical Decompression Fusion - Cervical Four-Cervical Five - Cervical Five-Cervical Six - Cervical Six- Cervical Seven;  Surgeon: Kary Kos, MD;  Location: [place];  Service: Neurosurgery;  Laterality: N/A;  Anterior Cervical Decompression Fusion - Cervical Four-Cervical Five - Cervical Five-Cervical Six - Cervical Six- Cervical Seven   ATRIAL FIBRILLATION ABLATION N/A 09/05/2021   Procedure: ATRIAL FIBRILLATION ABLATION;  Surgeon: Constance Haw, MD;  Location: [place] [facility];  Service: Cardiovascular;  Laterality: N/A;   CARDIOVERSION N/A 11/28/2020   Procedure: CARDIOVERSION;  Surgeon: Josue Hector, MD;  Location: [facility] ENDOSCOPY;   Service: Cardiovascular;  Laterality: N/A;   CARDIOVERSION N/A 04/15/2021   Procedure: CARDIOVERSION;  Surgeon: [name], MD;  Location: [facility] ENDOSCOPY;  Service: Cardiovascular;  Laterality: N/A;   CATARACT EXTRACTION     both eyes   CHOLECYSTECTOMY N/A 01/11/2018   Procedure: LAPAROSCOPIC CHOLECYSTECTOMY WITH INTRAOPERATIVE CHOLANGIOGRAM ERAS PATHWAY;  Surgeon: Alphonsa Overall, MD;  Location: [place];  Service: General;  Laterality: N/A;   COLONOSCOPY  04/23/2005   DIAGNOSTIC LAPAROSCOPY     EYE SURGERY     foot sx     with the ankle surgery   POLYPECTOMY     Patient Active Problem List   Diagnosis Date Noted   Persistent atrial fibrillation ([place])    HNP (herniated nucleus pulposus) with myelopathy, cervical 10/28/2020   Paresthesia 09/26/2020   Cerebrovascular accident (CVA) ([place]) 09/02/2020   Weakness 08/29/2020   Right cervical radiculopathy 08/29/2020   Hyperlipidemia 11/24/2019   Anxiety disorder, unspecified 11/25/2007   ALLERGIC RHINITIS 11/25/2007   History of colonic polyps 11/25/2007   Class 1 obesity with body mass index (BMI) of 32.0 to 32.9 in adult 11/24/2006   PANIC DISORDER 11/24/2006   Essential hypertension 11/24/2006   PAROXYSMAL SUPRAVENTRICULAR TACHYCARDIA 11/24/2006    PCP: Martinique, Betty, MD  REFERRING PROVIDER: Elba Barman, DO  REFERRING DIAG: [phone] (ICD-10-CM) - Achilles  tendinitis, left leg  THERAPY DIAG:  Pain in left ankle and joints of left foot - Plan: PT plan of care cert/re-cert  Stiffness of left foot, not elsewhere classified - Plan: PT plan of care cert/re-cert  Muscle weakness (generalized) - Plan: PT plan of care cert/re-cert  Rationale for Evaluation and Treatment: Rehabilitation  ONSET DATE: 6 months  SUBJECTIVE:   SUBJECTIVE STATEMENT: Pt is a 71 y/o female who presents to OPPT for Lt achilles tendinopathy.  She reports onset of symptoms about 6 months ago without known injury, with exacerbation over the summer when at  the beach.  She reports about 50% improvement at this time.  PERTINENT HISTORY: Allergies, anxiety, atrial fib, HLD, HTN, CVA, C5-7 ACDF   PAIN:  Are you having pain? Yes: NPRS scale: 0, up to 4/10 Pain location: Lt achilles Pain description: aching, occasional sharp pain Aggravating factors: exercises Relieving factors: avoiding exercises, ice, shockwave therapy  PRECAUTIONS: None  WEIGHT BEARING RESTRICTIONS: No  FALLS:  Has patient fallen in last 6 months? No  LIVING ENVIRONMENT: Lives with: lives with their spouse Lives in: House/apartment Stairs: Yes: Internal: 14 steps; on left going up and External: 2 steps; none (denies difficulty, some discomfort with descending) Has following equipment at home: None  OCCUPATION: retired Automotive engineer  PLOF: Independent  PATIENT GOALS: improve pain  NEXT MD VISIT: 04/28/22  OBJECTIVE:   DIAGNOSTIC FINDINGS: n/a  PATIENT SURVEYS:  04/23/22 FOTO 37 (predicted 70)  COGNITION: Overall cognitive status: Within functional limits for tasks assessed     SENSATION: WFL  PALPATION: 04/23/22: trigger points noted in Lt soleus/gastroc  LOWER EXTREMITY ROM:  Active ROM Right eval Left eval  Ankle dorsiflexion  -5  Ankle plantarflexion  49  Ankle inversion  18  Ankle eversion  15   (Blank rows = not tested)  Passive ROM Right eval Left eval  Ankle dorsiflexion  3  Ankle plantarflexion    Ankle inversion  20  Ankle eversion     (Blank rows = not tested)  LOWER EXTREMITY MMT:  MMT Right eval Left eval  Ankle dorsiflexion  5/5  Ankle plantarflexion 4/5 3/5  Ankle inversion  5/5  Ankle eversion  5/5   (Blank rows = not tested)   GAIT: 04/23/22: independent without significant deviations for distance in clinic   TODAY'S TREATMENT:                                                                                                                              DATE: 04/23/22   TherEx See HEP -  demonstrated or performed trial reps with min cues for comprehension  Manual STM with compression to Lt soleus and gastroc; skilled palpation and monitoring of soft tissue during DN  Trigger Point Dry-Needling  Treatment instructions: Expect mild to moderate muscle soreness. S/S of pneumothorax if dry needled over a lung field, and to seek immediate medical attention should they occur. Patient verbalized  understanding of these instructions and education.  Patient Consent Given: Yes Education handout provided: Yes Muscles treated: Lt soleus, gastroc Electrical stimulation performed: No Parameters: N/A Treatment response/outcome: twitch responses noted   PATIENT EDUCATION:  Education details: HEP, DN Person educated: Patient Education method: Consulting civil engineer, Demonstration, and Handouts Education comprehension: verbalized understanding, returned demonstration, and needs further education  HOME EXERCISE PROGRAM: Access Code: [email or web address] URL: [email or web address] Date: 04/23/2022 Prepared by: Faustino Congress  Exercises - Soleus Stretch on Wall  - 1-2 x daily - 7 x weekly - 1 sets - 3 reps - 30 sec hold - Gastroc Stretch on Wall  - 1-2 x daily - 7 x weekly - 1 sets - 3 reps - 30 sec hold - Standing Eccentric Heel Raise  - 1 x daily - 7 x weekly - 1-2 sets - 10 reps - Calf Mobilization with Small Ball  - 1 x daily - 7 x weekly - 1 sets - 1 reps - 2-3 min hold - Seated Plantar Fascia Mobilization with Small Ball  - 1 x daily - 7 x weekly - 1 sets - 1 reps - 2-3 hold  Patient Education - Trigger Point Dry Needling  ASSESSMENT:  CLINICAL IMPRESSION: Patient is a 71 y.o. female who was seen today for physical therapy evaluation and treatment for Lt achilles tendonitis.  She demonstrates mild strength and ROM deficits as well as continued pain affecting functional mobility.  She will benefit from PT to address deficits listed.   OBJECTIVE IMPAIRMENTS: decreased mobility,  decreased ROM, decreased strength, increased fascial restrictions, increased muscle spasms, and pain.   ACTIVITY LIMITATIONS: squatting, stairs, and locomotion level  PARTICIPATION LIMITATIONS: meal prep, cleaning, laundry, shopping, and community activity  PERSONAL FACTORS: 3+ comorbidities: Allergies, anxiety, atrial fib, HLD, HTN, CVA, C5-7 ACDF  are also affecting patient's functional outcome.   REHAB POTENTIAL: Good  CLINICAL DECISION MAKING: Evolving/moderate complexity  EVALUATION COMPLEXITY: Moderate   GOALS: Goals reviewed with patient? Yes  SHORT TERM GOALS: Target date: 05/14/2022  Independent with initial HEP Goal status: INITIAL  LONG TERM GOALS: Target date: 06/04/2022  Independent with final HEP Goal status: INITIAL  2.  FOTO score improved to 61 Goal status: INITIAL  3.  Lt ankle DF AROM improved to at least neutral for improved mobility and function Goal status: INIITAL  4.  Report pain < 2/10 with prolonged standing and walking activities for improved function Goal status: INITIAL  5.  Demonstrate at least 4/5 Lt ankle PF strength for improved function Goal status: INITIAL    PLAN:  PT FREQUENCY: 1x/week  PT DURATION: 6 weeks  PLANNED INTERVENTIONS: Therapeutic exercises, Therapeutic activity, Neuromuscular re-education, Balance training, Gait training, Patient/Family education, Self Care, Joint mobilization, Stair training, Aquatic Therapy, Dry Needling, Electrical stimulation, Cryotherapy, Moist heat, Taping, Vasopneumatic device, Ultrasound, Ionotophoresis '4mg'$ /ml Dexamethasone, Manual therapy, and Re-evaluation  PLAN FOR NEXT SESSION: assess response to HEP, ?ionto, review HEP and progress as able   Laureen Abrahams, PT, DPT 04/23/22 11:31 AM   Referring diagnosis? M76.62 Treatment diagnosis? (if different than referring diagnosis) M25.572, M25.675, M62.81 What was this (referring dx) caused by? '[]'$  Surgery '[]'$  Fall '[x]'$  Ongoing  issue '[]'$  Arthritis '[]'$  Other: ____________  Laterality: '[]'$  Rt '[x]'$  Lt '[]'$  Both  Check all possible CPT codes:  *CHOOSE 10 OR LESS*    '[x]'$  97110 (Therapeutic Exercise)  '[]'$  92507 (SLP Treatment)  '[x]'$  97112 (Neuro Re-ed)   '[]'$  92526 (Swallowing Treatment)   '[]'$  84166 (Gait Training)   '[]'$   97129 (Cognitive Training, 1st 15 minutes) '[x]'$  97140 (Manual Therapy)   '[]'$  97130 (Cognitive Training, each add'l 15 minutes)  '[x]'$  97164 (Re-evaluation)                              '[]'$  Other, List CPT Code ____________  '[x]'$  97530 (Therapeutic Activities)     '[]'$  97535 (Self Care)   '[]'$  All codes above (97110 - 97535)  '[]'$  97012 (Mechanical Traction)  '[x]'$  97014 (E-stim Unattended)  '[]'$  97032 (E-stim manual)  '[x]'$  97033 (Ionto)  '[x]'$  97035 (Ultrasound) '[x]'$  08676 (Physical Performance Training) '[]'$  [id number] (Aquatic Therapy) '[x]'$  97016 (Vasopneumatic Device) '[]'$  [id number] (Paraffin) '[]'$  97034 (Contrast Bath) '[]'$  97597 (Wound Care 1st 20 sq cm) '[]'$  97598 (Wound Care each add'l 20 sq cm) '[]'$  97760 (Orthotic Fabrication, Fitting, Training Initial) '[]'$  [id number] (Prosthetic Management and Training Initial) '[]'$  [id number] (Orthotic or Prosthetic Training/ Modification Subsequent)

## 2022-04-28 ENCOUNTER — Encounter: Payer: Self-pay | Admitting: Sports Medicine

## 2022-04-28 ENCOUNTER — Ambulatory Visit: Payer: Medicare PPO | Admitting: Sports Medicine

## 2022-04-28 ENCOUNTER — Encounter: Payer: Self-pay | Admitting: Physical Therapy

## 2022-04-28 ENCOUNTER — Ambulatory Visit (INDEPENDENT_AMBULATORY_CARE_PROVIDER_SITE_OTHER): Payer: Medicare PPO | Admitting: Physical Therapy

## 2022-04-28 DIAGNOSIS — M25675 Stiffness of left foot, not elsewhere classified: Secondary | ICD-10-CM | POA: Diagnosis not present

## 2022-04-28 DIAGNOSIS — M25572 Pain in left ankle and joints of left foot: Secondary | ICD-10-CM

## 2022-04-28 DIAGNOSIS — M6281 Muscle weakness (generalized): Secondary | ICD-10-CM | POA: Diagnosis not present

## 2022-04-28 DIAGNOSIS — M7662 Achilles tendinitis, left leg: Secondary | ICD-10-CM | POA: Diagnosis not present

## 2022-04-28 MED ORDER — NITROGLYCERIN 0.2 MG/HR TD PT24: MEDICATED_PATCH | TRANSDERMAL | 2 refills | Status: DC

## 2022-04-28 NOTE — Progress Notes (Signed)
Glenda [name] - 71 y.o. female MRN [id number]  Date of birth: 02/27/1951  Office Visit Note: Visit Date: 04/28/2022 PCP: Martinique, Betty G, MD Referred by: Martinique, Betty G, MD  Subjective: Chief Complaint  Patient presents with   Left Leg - Follow-up   HPI: Glenda [name] is a pleasant 71 y.o. female who presents today for follow-up of left achilles tendinopathy.  She has been receiving weekly shockwave treatment which at this point had improved her pain by at least 50%.  She also had a PT evaluation and treatment course on 04/23/2022.  She modified some home exercises including dry needling which she found to be somewhat helpful.  Patient was walking a lot of miles around mildly on Saturday and had no pain [place] Sunday, however on Monday she awoke with quite significant pain in the Achilles.  Unfortunately, she has aggravated the Achilles tendon and even walking has been somewhat painful for her.  Chart review: -I reviewed the notes associated at the physical therapy visit with Colletta Maryland on 04/23/2022 including evaluation and treatment modalities.  Pertinent ROS were reviewed with the patient and found to be negative unless otherwise specified above in HPI.   Assessment & Plan: Visit Diagnoses:  1. Achilles tendinitis, left leg   2. Acute left ankle pain    Plan: Glenda [name] has had a setback with her Achilles with an exacerbation of her chronic Achilles tendinopathy.  She had been making progress with shockwave therapy, she modifications and rehab, but she has had a few episodes of worsening of her pain.  We did perform shockwave treatment therapy #5 today.  Given that her improvement has plateaued here recently, we discussed additional treatment options such as nitroglycerin patch protocol, continue dedicated formal therapy, PRP injection therapy, or surgical intervention.  She chose to proceed with nitroglycerin patch protocol and will continue some of her formalized physical therapy.  I sent  nitroglycerin patch to her pharmacy and did discuss treatment instructions with her today.  I did also provide her some information on PRP injection and what that would entail to heal the damaged tendon.  We will perform an additional shockwave treatment and then give some time for her to rest and see if the nitroglycerin patch continues to improve her conditioning.  Follow-up: Return in about 1 week (around 05/05/2022) for for repeat ECSWT (upfront fee).   Meds & Orders:  Meds ordered this encounter  Medications   nitroGLYCERIN (NITRODUR - DOSED IN MG/24 HR) 0.2 mg/hr patch    Sig: Cut patch into fourths. Place 1/4 patch over affected area on achilles. Change patch every 24 hours.    Dispense:  30 patch    Refill:  2   No orders of the defined types were placed in this encounter.    Procedures: Procedure Details Consent: Risks of procedure as well as the alternatives and risks of each were explained to the patient.  Verbal consent for procedure obtained. Time Out: Verified patient identification, verified procedure, site was marked, verified correct patient position. The area was cleaned with alcohol swab.     The left achilles tendon was targeted for Extracorporeal shockwave therapy.    Preset: patellar tendinitis Power Level: 110 mJ Frequency: 12 -> 13 Hz Impulse/cycles: 2500 Head size: Regular   Patient tolerated procedure well without immediate complications.      Clinical History: No specialty comments available.  She reports that she quit smoking about 18 months ago. Her smoking use included cigarettes. She has  a 12.50 pack-year smoking history. She has never used smokeless tobacco.  Recent Labs    03/10/22 0943 03/30/22 0956  HGBA1C 6.5 6.2    Objective:    Physical Exam  Gen: Well-appearing, in no acute distress; non-toxic CV: Well-perfused. Warm.  Resp: Breathing unlabored on room air; no wheezing. Psych: Fluid speech in conversation; appropriate affect; normal  thought process Neuro: Sensation intact throughout. No gross coordination deficits.   Ortho Exam - Left achilles/ankle: Walks with a slightly antalgic gait.  There is notable thickening of the midportion of the Achilles tendon with surrounding soft tissue swelling.  There is some warmth around the swollen mid Achilles.  No erythema or effusion of the ankle joint.  Negative calcaneal heel squeeze.  Slight pain with endrange active and passive dorsiflexion.  Thompson test intact with appropriate plantarflexion.  Imaging: 02/04/22: [facility] Limited leg/achilles ultrasound performed, left   -Evaluation of the posterior calcaneus shows no significant cortical  irregularity.  There is proper insertion of the Achilles tendon onto the  posterior calcaneus.  Insertional tendon thickness 0.58 cm. No insertional  deficits or tearing.  Skin approximately near the midportion of the  Achilles tendon there is notable thickening of the tendon with hypoechoic  fluid within the tendon itself and notable hyperemia.  Tendon thickness  1.14 cm at mid Achilles, notably thickened and enlarged. Long axis  scanning more proximally to the Achilles and myotendinous junction of the  gastroc and soleus shows no abnormality or thickening of the tendon or  musculature at that region.   Past Medical/Family/Surgical/Social History: Medications & Allergies reviewed per EMR, new medications updated. Patient Active Problem List   Diagnosis Date Noted   Persistent atrial fibrillation ([facility])    HNP (herniated nucleus pulposus) with myelopathy, cervical 10/28/2020   Paresthesia 09/26/2020   Cerebrovascular accident (CVA) ([place]) 09/02/2020   Weakness 08/29/2020   Right cervical radiculopathy 08/29/2020   Hyperlipidemia 11/24/2019   Anxiety disorder, unspecified 11/25/2007   ALLERGIC RHINITIS 11/25/2007   History of colonic polyps 11/25/2007   Class 1 obesity with body mass index (BMI) of 32.0 to 32.9 in adult 11/24/2006   PANIC  DISORDER 11/24/2006   Essential hypertension 11/24/2006   PAROXYSMAL SUPRAVENTRICULAR TACHYCARDIA 11/24/2006   Past Medical History:  Diagnosis Date   Allergic rhinitis    Allergy    Anxiety    Arthritis    Cataract    Heart murmur    History of colonic polyps    History of PSVT (paroxysmal supraventricular tachycardia)    Hypertension    Impaired glucose tolerance    Obesity    PAT (paroxysmal atrial tachycardia)    Pre-diabetes    hx, not currently being monitor for diabetes, diet controlled   Stroke ([place]) 08/2020   Scan revealed stroke in history   Tobacco user    Vertigo    started 3 weeks ago- after fluid trapped in ear   Family History  Problem Relation Age of Onset   Healthy Mother    Dementia Father    Healthy Sister    Healthy Sister    Alzheimer's disease Other    Colon cancer Neg Hx    Esophageal cancer Neg Hx    Rectal cancer Neg Hx    Stomach cancer Neg Hx    Colon polyps Neg Hx    Past Surgical History:  Procedure Laterality Date   ankle sx-left fx     ANTERIOR CERVICAL DECOMP/DISCECTOMY FUSION N/A 10/28/2020   Procedure: Anterior Cervical  Decompression Fusion - Cervical Four-Cervical Five - Cervical Five-Cervical Six - Cervical Six- Cervical Seven;  Surgeon: Kary Kos, MD;  Location: [place];  Service: Neurosurgery;  Laterality: N/A;  Anterior Cervical Decompression Fusion - Cervical Four-Cervical Five - Cervical Five-Cervical Six - Cervical Six- Cervical Seven   ATRIAL FIBRILLATION ABLATION N/A 09/05/2021   Procedure: ATRIAL FIBRILLATION ABLATION;  Surgeon: Constance Haw, MD;  Location: [place] [facility];  Service: Cardiovascular;  Laterality: N/A;   CARDIOVERSION N/A 11/28/2020   Procedure: CARDIOVERSION;  Surgeon: Josue Hector, MD;  Location: [facility] ENDOSCOPY;  Service: Cardiovascular;  Laterality: N/A;   CARDIOVERSION N/A 04/15/2021   Procedure: CARDIOVERSION;  Surgeon: [name], MD;  Location: [place];  Service: Cardiovascular;   Laterality: N/A;   CATARACT EXTRACTION     both eyes   CHOLECYSTECTOMY N/A 01/11/2018   Procedure: LAPAROSCOPIC CHOLECYSTECTOMY WITH INTRAOPERATIVE CHOLANGIOGRAM ERAS PATHWAY;  Surgeon: Alphonsa Overall, MD;  Location: [place];  Service: General;  Laterality: N/A;   COLONOSCOPY  04/23/2005   DIAGNOSTIC LAPAROSCOPY     EYE SURGERY     foot sx     with the ankle surgery   POLYPECTOMY     Social History   Occupational History   Occupation: Retired  Tobacco Use   Smoking status: Former    Packs/day: 0.25    Years: 50.00    Total pack years: 12.50    Types: Cigarettes    Quit date: 10/17/2020    Years since quitting: 1.5   Smokeless tobacco: Never  Vaping Use   Vaping Use: Never used  Substance and Sexual Activity   Alcohol use: Yes    Alcohol/week: 2.0 standard drinks of alcohol    Types: 2 Standard drinks or equivalent per week    Comment: weekends   Drug use: No   Sexual activity: Yes

## 2022-04-28 NOTE — Therapy (Signed)
OUTPATIENT PHYSICAL THERAPY TREATMENT NOTE   Patient Name: MILLENIA WALDVOGEL MRN: [id number] DOB:02-Mar-1951, 71 y.o., female Today's Date: 04/28/2022  END OF SESSION:   PT End of Session - 04/28/22 1146     Visit Number 2    Number of Visits 6    Date for PT Re-Evaluation 06/04/22    Authorization Type [employer] $20 copay    Progress Note Due on Visit 10    PT Start Time 1146    PT Stop Time 1225    PT Time Calculation (min) 39 min    Activity Tolerance Patient tolerated treatment well    Behavior During Therapy WFL for tasks assessed/performed             Past Medical History:  Diagnosis Date   Allergic rhinitis    Allergy    Anxiety    Arthritis    Cataract    Heart murmur    History of colonic polyps    History of PSVT (paroxysmal supraventricular tachycardia)    Hypertension    Impaired glucose tolerance    Obesity    PAT (paroxysmal atrial tachycardia)    Pre-diabetes    hx, not currently being monitor for diabetes, diet controlled   Stroke ([place]) 08/2020   Scan revealed stroke in history   Tobacco user    Vertigo    started 3 weeks ago- after fluid trapped in ear   Past Surgical History:  Procedure Laterality Date   ankle sx-left fx     ANTERIOR CERVICAL DECOMP/DISCECTOMY FUSION N/A 10/28/2020   Procedure: Anterior Cervical Decompression Fusion - Cervical Four-Cervical Five - Cervical Five-Cervical Six - Cervical Six- Cervical Seven;  Surgeon: Kary Kos, MD;  Location: [place];  Service: Neurosurgery;  Laterality: N/A;  Anterior Cervical Decompression Fusion - Cervical Four-Cervical Five - Cervical Five-Cervical Six - Cervical Six- Cervical Seven   ATRIAL FIBRILLATION ABLATION N/A 09/05/2021   Procedure: ATRIAL FIBRILLATION ABLATION;  Surgeon: Constance Haw, MD;  Location: [place] [facility];  Service: Cardiovascular;  Laterality: N/A;   CARDIOVERSION N/A 11/28/2020   Procedure: CARDIOVERSION;  Surgeon: Josue Hector, MD;  Location: [facility] ENDOSCOPY;  Service:  Cardiovascular;  Laterality: N/A;   CARDIOVERSION N/A 04/15/2021   Procedure: CARDIOVERSION;  Surgeon: [name], MD;  Location: [facility] ENDOSCOPY;  Service: Cardiovascular;  Laterality: N/A;   CATARACT EXTRACTION     both eyes   CHOLECYSTECTOMY N/A 01/11/2018   Procedure: LAPAROSCOPIC CHOLECYSTECTOMY WITH INTRAOPERATIVE CHOLANGIOGRAM ERAS PATHWAY;  Surgeon: Alphonsa Overall, MD;  Location: [place];  Service: General;  Laterality: N/A;   COLONOSCOPY  04/23/2005   DIAGNOSTIC LAPAROSCOPY     EYE SURGERY     foot sx     with the ankle surgery   POLYPECTOMY     Patient Active Problem List   Diagnosis Date Noted   Persistent atrial fibrillation ([place])    HNP (herniated nucleus pulposus) with myelopathy, cervical 10/28/2020   Paresthesia 09/26/2020   Cerebrovascular accident (CVA) ([place]) 09/02/2020   Weakness 08/29/2020   Right cervical radiculopathy 08/29/2020   Hyperlipidemia 11/24/2019   Anxiety disorder, unspecified 11/25/2007   ALLERGIC RHINITIS 11/25/2007   History of colonic polyps 11/25/2007   Class 1 obesity with body mass index (BMI) of 32.0 to 32.9 in adult 11/24/2006   PANIC DISORDER 11/24/2006   Essential hypertension 11/24/2006   PAROXYSMAL SUPRAVENTRICULAR TACHYCARDIA 11/24/2006     THERAPY DIAG:  Pain in left ankle and joints of left foot  Stiffness of left  foot, not elsewhere classified  Muscle weakness (generalized)   PCP: Martinique, Betty, MD  REFERRING PROVIDER: Elba Barman, DO  REFERRING DIAG: [phone] (ICD-10-CM) - Achilles tendinitis, left leg  EVAL THERAPY DIAG:  Pain in left ankle and joints of left foot - Plan: PT plan of care cert/re-cert  Stiffness of left foot, not elsewhere classified - Plan: PT plan of care cert/re-cert  Muscle weakness (generalized) - Plan: PT plan of care cert/re-cert  Rationale for Evaluation and Treatment: Rehabilitation  ONSET DATE: 6 months  SUBJECTIVE:   SUBJECTIVE STATEMENT: Went to [place] and walked 3.5 miles  without any pain on Saturday; then no pain Sunday but Monday woke up barely able to walk.  Still hurts today 3-4/10 with walking.  PERTINENT HISTORY: Allergies, anxiety, atrial fib, HLD, HTN, CVA, C5-7 ACDF   PAIN:  Are you having pain? Yes: NPRS scale: 0, up to 4/10 Pain location: Lt achilles Pain description: aching, occasional sharp pain Aggravating factors: exercises Relieving factors: avoiding exercises, ice, shockwave therapy  PRECAUTIONS: None  WEIGHT BEARING RESTRICTIONS: No  FALLS:  Has patient fallen in last 6 months? No  LIVING ENVIRONMENT: Lives with: lives with their spouse Lives in: House/apartment Stairs: Yes: Internal: 14 steps; on left going up and External: 2 steps; none (denies difficulty, some discomfort with descending) Has following equipment at home: None  OCCUPATION: retired Automotive engineer  PLOF: Independent  PATIENT GOALS: improve pain  NEXT MD VISIT: 04/28/22  OBJECTIVE:   DIAGNOSTIC FINDINGS: n/a  PATIENT SURVEYS:  04/23/22 FOTO 75 (predicted 26)  COGNITION: Overall cognitive status: Within functional limits for tasks assessed     SENSATION: WFL  PALPATION: 04/23/22: trigger points noted in Lt soleus/gastroc  LOWER EXTREMITY ROM:  Active ROM Right eval Left eval  Ankle dorsiflexion  -5  Ankle plantarflexion  49  Ankle inversion  18  Ankle eversion  15   (Blank rows = not tested)  Passive ROM Right eval Left eval  Ankle dorsiflexion  3  Ankle plantarflexion    Ankle inversion  20  Ankle eversion     (Blank rows = not tested)  LOWER EXTREMITY MMT:  MMT Right eval Left eval  Ankle dorsiflexion  5/5  Ankle plantarflexion 4/5 3/5  Ankle inversion  5/5  Ankle eversion  5/5   (Blank rows = not tested)   GAIT: 04/23/22: independent without significant deviations for distance in clinic   TODAY'S TREATMENT:                                                                                                                                DATE: 04/28/22  Manual STM with compression to Lt soleus and gastroc; skilled palpation and monitoring of soft tissue during DN  Trigger Point Dry-Needling  Treatment instructions: Expect mild to moderate muscle soreness. S/S of pneumothorax if dry needled over a lung field, and to seek immediate medical attention should they occur. Patient verbalized understanding  of these instructions and education.  Patient Consent Given: Yes Education handout provided: Yes Muscles treated: Lt soleus, gastroc Electrical stimulation performed: Yes Parameters: 10 mA freq with intensity to tolerance x 7 min Treatment response/outcome: twitch responses noted  TherEx Discussed light active recovery options to gradually work back into exercise, she also is going to two [employer] this weekend so discussed walking tolerance and activity  DATE: 04/23/22   TherEx See HEP - demonstrated or performed trial reps with min cues for comprehension  Manual STM with compression to Lt soleus and gastroc; skilled palpation and monitoring of soft tissue during DN  Trigger Point Dry-Needling  Treatment instructions: Expect mild to moderate muscle soreness. S/S of pneumothorax if dry needled over a lung field, and to seek immediate medical attention should they occur. Patient verbalized understanding of these instructions and education.  Patient Consent Given: Yes Education handout provided: Yes Muscles treated: Lt soleus, gastroc Electrical stimulation performed: No Parameters: N/A Treatment response/outcome: twitch responses noted   PATIENT EDUCATION:  Education details: HEP, DN Person educated: Patient Education method: Consulting civil engineer, Demonstration, and Handouts Education comprehension: verbalized understanding, returned demonstration, and needs further education  HOME EXERCISE PROGRAM: Access Code: [email or web address] URL: [email or web address] Date: 04/23/2022 Prepared  by: Faustino Congress  Exercises - Soleus Stretch on Wall  - 1-2 x daily - 7 x weekly - 1 sets - 3 reps - 30 sec hold - Gastroc Stretch on Wall  - 1-2 x daily - 7 x weekly - 1 sets - 3 reps - 30 sec hold - Standing Eccentric Heel Raise  - 1 x daily - 7 x weekly - 1-2 sets - 10 reps - Calf Mobilization with Small Ball  - 1 x daily - 7 x weekly - 1 sets - 1 reps - 2-3 min hold - Seated Plantar Fascia Mobilization with Small Ball  - 1 x daily - 7 x weekly - 1 sets - 1 reps - 2-3 hold  Patient Education - Trigger Point Dry Needling  ASSESSMENT:  CLINICAL IMPRESSION: Pt with increased activity without initial onset of pain following last visit.  Trial of DN with estim today to see if that is beneficial.  Did have return of pain yesterday which has improved but still present.  Will continue to benefit from PT to maximize function.  OBJECTIVE IMPAIRMENTS: decreased mobility, decreased ROM, decreased strength, increased fascial restrictions, increased muscle spasms, and pain.   ACTIVITY LIMITATIONS: squatting, stairs, and locomotion level  PARTICIPATION LIMITATIONS: meal prep, cleaning, laundry, shopping, and community activity  PERSONAL FACTORS: 3+ comorbidities: Allergies, anxiety, atrial fib, HLD, HTN, CVA, C5-7 ACDF are also affecting patient's functional outcome.   REHAB POTENTIAL: Good  CLINICAL DECISION MAKING: Evolving/moderate complexity  EVALUATION COMPLEXITY: Moderate   GOALS: Goals reviewed with patient? Yes  SHORT TERM GOALS: Target date: 05/14/2022  Independent with initial HEP Goal status: INITIAL  LONG TERM GOALS: Target date: 06/04/2022  Independent with final HEP Goal status: INITIAL  2.  FOTO score improved to 61 Goal status: INITIAL  3.  Lt ankle DF AROM improved to at least neutral for improved mobility and function Goal status: INIITAL  4.  Report pain < 2/10 with prolonged standing and walking activities for improved function Goal status:  INITIAL  5.  Demonstrate at least 4/5 Lt ankle PF strength for improved function Goal status: INITIAL    PLAN:  PT FREQUENCY: 1x/week  PT DURATION: 6 weeks  PLANNED INTERVENTIONS: Therapeutic exercises, Therapeutic activity, Neuromuscular re-education, Balance training, Gait  training, Patient/Family education, Self Care, Joint mobilization, Stair training, Aquatic Therapy, Dry Needling, Electrical stimulation, Cryotherapy, Moist heat, Taping, Vasopneumatic device, Ultrasound, Ionotophoresis '4mg'$ /ml Dexamethasone, Manual therapy, and Re-evaluation  PLAN FOR NEXT SESSION:  assess response to DN/estim, ?ionto, review HEP and progress as able    Faustino Congress, PT 04/28/2022, 12:47 PM

## 2022-04-28 NOTE — Patient Instructions (Signed)
Nitroglycerin Protocol  Apply 1/4 nitroglycerin patch to affected area (achilles) daily. Change position of patch within the affected area every 24 hours. You may experience a headache during the first 1-2 weeks of using the patch, these should subside. If you experience headaches after beginning nitroglycerin patch treatment, you may take your preferred over the counter pain reliever. Another side effect of the nitroglycerin patch is skin irritation or rash related to patch adhesive. Please notify our office if you develop more severe headaches or rash, and stop the patch. Tendon healing with nitroglycerin patch may require 12 to 24 weeks depending on the extent of injury. Men should not use if taking Viagra, Cialis, or Levitra.  Do not use if you have migraines or rosacea.

## 2022-04-28 NOTE — Progress Notes (Signed)
Has good and bad days; was able to walk around [place] on Saturday with no issues but yesterday she woke up to increased pain

## 2022-05-01 ENCOUNTER — Other Ambulatory Visit: Payer: Self-pay | Admitting: Family Medicine

## 2022-05-05 ENCOUNTER — Encounter: Payer: Self-pay | Admitting: Physical Therapy

## 2022-05-05 ENCOUNTER — Encounter: Payer: Self-pay | Admitting: Sports Medicine

## 2022-05-05 ENCOUNTER — Ambulatory Visit: Payer: Medicare PPO | Admitting: Physical Therapy

## 2022-05-05 ENCOUNTER — Ambulatory Visit: Payer: Self-pay | Admitting: Sports Medicine

## 2022-05-05 DIAGNOSIS — M25675 Stiffness of left foot, not elsewhere classified: Secondary | ICD-10-CM | POA: Diagnosis not present

## 2022-05-05 DIAGNOSIS — M25572 Pain in left ankle and joints of left foot: Secondary | ICD-10-CM

## 2022-05-05 DIAGNOSIS — M7662 Achilles tendinitis, left leg: Secondary | ICD-10-CM

## 2022-05-05 DIAGNOSIS — M6281 Muscle weakness (generalized): Secondary | ICD-10-CM | POA: Diagnosis not present

## 2022-05-05 NOTE — Progress Notes (Signed)
Here today for another shockwave  Doing ok; slowly making progress

## 2022-05-05 NOTE — Progress Notes (Signed)
   Procedure Note  HPI: Glenda [name] is a pleasant 71 y.o. female who presents today for follow-up of left achilles tendinitis/tendinopathy. She presents for shockwave therapy #6 today.  Patient: Glenda [name]             Date of Birth: 15-Oct-1950           MRN: [id number]             Visit Date: 05/05/2022  Procedures: Visit Diagnoses:  1. Achilles tendinitis, left leg    Procedure Details Consent: Risks of procedure as well as the alternatives and risks of each were explained to the patient.  Verbal consent for procedure obtained. Time Out: Verified patient identification, verified procedure, site was marked, verified correct patient position. The area was cleaned with alcohol swab.     The left achilles tendon was targeted for Extracorporeal shockwave therapy.    Preset: patellar tendinitis Power Level: 110 mJ Frequency: 13 Hz Impulse/cycles: 2800 Head size: Regular   Patient tolerated procedure well without immediate complications.  -We discussed all treatment options given that she is having some improvement, but still slowly.  We will follow-up in 2 weeks for visit to reevaluate how she does with a small break.  May consider 1 additional shockwave treatment if finding benefit.  Also provided information regarding prolotherapy injection versus PRP to help With healing.  She will think about this and we will reevaluate in 2 weeks  Elba Barman, DO [place]  This note was dictated using Dragon naturally speaking software and may contain errors in syntax, spelling, or content which have not been identified prior to signing this note.

## 2022-05-05 NOTE — Patient Instructions (Signed)
Jessy Oto seeing you again as usual. If shockwave and nitro patch aren't getting [place] there, the next two options would be: - PRP injection - Prolotherapy (dextrose) injection   Dr. Rolena Infante' Instructions and What to Expect for PRP Injections:  Platelet-rich plasma is used in musculoskeletal medicine to focus your own body's ability to heal. It has several well-done published randomized control trials (RCT) which demonstrate both its effectiveness and safety in many musculoskeletal conditions, including osteoarthritis, tendinopathies, and damaged vertebral discs. PRP has been in clinical use since the 1990's. Many people know that platelets form a clot if there is a cut in the skin. It turns out that platelets do not only form a clot, they also start the body's own repair process. When platelets activate to form a clot, they also release alpha granules which have hundreds of chemical messengers in them that initiate and organize repair to the damaged tissue. Precisely placing PRP into the site of injury will initiate the healing process by activating on the damaged cartilage or tendon. This is an inflammatory process, and inflammation is the vital first phase of Healing.  What to expect and how to prepare for PRP   2 weeks prior to the procedure: depending on the procedure, you may need to arrange for a driver to bring you home. IF you are having a lower extremity procedure, we can provide crutches as needed. You may need to go into a walking boot for 7-14 days.   10 days prior to the procedure: Stop taking anti-inflammatory drugs like Ibuprofen/Motrin, Advil, Naprosyn, Celebrex, or Meloxicam. Even aspirin should be stopped (but need to discuss this with Dr. Rolena Infante and your cardiologist beforehand). Let Dr. Rolena Infante know if you have been taking prednisone or other corticosteroids in the last month.   The day before the procedure: thoroughly shower and clean your skin.    The day of the  procedure: Wear loose-fitting clothing like sweatpants or shorts. If you are having an upper body procedure wear a top that can button or zip up.  PRP will initiate healing and a productive inflammation, and PRP therapy will make the body part treated sore for 4 days to two weeks. Anti-inflammatory drugs (i.e. ibuprofen, Naprosyn, Celebrex) and corticosteroids such as prednisone can blunt or stop this process, so it is important to not take any anti-inflammatory drugs for 7 days before getting PRP therapy, or for at least three weeks after PRP therapy. Corticosteroid injections can blunt inflammation for 30 days, so let us know if you have had one recently. Depending on the body part injected, you may be in a sling or on crutches for several days. Just like wringing out a wet dishcloth, if you load or tense a tendon or ligament that has just been injected with PRP, some of the PRP injected will squish out. By keeping the body part treated relaxed by using a sling (for the shoulder or arm) or crutches (for hips and legs) for a few days, the PRP can bind in place and do its job.   You may need a driver to bring you home.  Tobacco/nicotine is a potent toxin and its use constricts small blood vessels which are needed for tissue repair.  Tobacco/nicotine use will limit the effectiveness of any treatment and stopping tobacco use is one of the single  greatest actions you can take to improve your health. Avoid toxins like alcohol, which inhibits and depresses the cells needed for tissue repair.  What happens during  the PRP procedure?  Platelet rich plasma is made by taking some of your blood and performing a two-stage centrifuge process on it to concentrate the PRP. First, your blood is drawn into a syringe with a small amount of anti-coagulant in it (this is to keep the blood from clotting during this process). The amount of blood drawn is usually about 10-30 milliliters, depending on how much  PRP is needed for the treatment.  (There are 355 milliliters in a 12-ounce soda can for comparison).  Then the blood is transferred in a sterile fashion into a centrifuge tube. It is then centrifuged for the first cycle where the red blood cells are isolated and discarded. In the second centrifuge cycle, the platelet-rich fraction of the remaining plasma is concentrated and placed in a syringe. The skin at the injection site is numbed with a small amount of topical cooling spray. Dr Dianah Field will then precisely inject the PRP into the injury site using ultrasound guidance.

## 2022-05-19 ENCOUNTER — Other Ambulatory Visit: Payer: Self-pay | Admitting: Family Medicine

## 2022-05-19 DIAGNOSIS — Z1231 Encounter for screening mammogram for malignant neoplasm of breast: Secondary | ICD-10-CM

## 2022-05-20 ENCOUNTER — Encounter: Payer: Self-pay | Admitting: Sports Medicine

## 2022-05-20 ENCOUNTER — Ambulatory Visit: Payer: Medicare PPO | Admitting: Sports Medicine

## 2022-05-20 DIAGNOSIS — M7662 Achilles tendinitis, left leg: Secondary | ICD-10-CM | POA: Diagnosis not present

## 2022-05-20 NOTE — Progress Notes (Signed)
FELCIA HUEBERT - 72 y.o. female MRN [id number]  Date of birth: 1950-12-26  Office Visit Note: Visit Date: 05/20/2022 PCP: Martinique, Betty G, MD Referred by: Martinique, Betty G, MD  Subjective: Chief Complaint  Patient presents with   Left Heel - Pain, Follow-up   HPI: Glenda [name] is a pleasant 72 y.o. female who presents today for left achilles tendinitis/tendinopathy.  Doing well Still some pain, but markedly improved Doing HEP 2x/day Continues on the nitroglycerin patch protocol, changing every 24 hours.  She has been on this for the last 2 weeks. No SE's.  Pertinent ROS were reviewed with the patient and found to be negative unless otherwise specified above in HPI.   Assessment & Plan: Visit Diagnoses:  1. Achilles tendinitis, left leg    Plan: Encouraged that she is continued to find benefit from her home rehab and the extracorporeal shockwave therapy, we did repeat this today with good benefit.  We will address continue home exercise and Alfredson heel drop exercises from twice daily to once daily.  Continue nitroglycerin patch protocol 0.'2mg'$ /q 24 hrs. Will follow-up in 1 week for ECSWT.  Follow-up: Return for for ECSWT left achilles.   Meds & Orders: No orders of the defined types were placed in this encounter.  No orders of the defined types were placed in this encounter.    Procedures: Procedure Details Consent: Risks of procedure as well as the alternatives and risks of each were explained to the patient.  Verbal consent for procedure obtained. Time Out: Verified patient identification, verified procedure, site was marked, verified correct patient position. The area was cleaned with alcohol swab.     The left achilles tendon was targeted for Extracorporeal shockwave therapy.    Preset: patellar/achilles tendinitis, chronic Power Level: 110 mJ Frequency: 13 Hz  Impulse/cycles: 2900 Head size: Regular   Patient tolerated procedure well without immediate  complications.      Clinical History: No specialty comments available.  She reports that she quit smoking about 19 months ago. Her smoking use included cigarettes. She has a 12.50 pack-year smoking history. She has never used smokeless tobacco.  Recent Labs    03/10/22 0943 03/30/22 0956  HGBA1C 6.5 6.2    Objective:   Vital Signs: There were no vitals taken for this visit.  Physical Exam  Gen: Well-appearing, in no acute distress; non-toxic CV: Regular Rate. Well-perfused. Warm.  Resp: Breathing unlabored on room air; no wheezing. Psych: Fluid speech in conversation; appropriate affect; normal thought process Neuro: Sensation intact throughout. No gross coordination deficits.   Ortho Exam - Left leg/foot: There is some thickening and nodularity of the mid aspect of the Achilles on the left.  Negative calcaneal heel squeeze.  No significant warmth, there is very mild soft tissue swelling around the Achilles.  Full range plantar and dorsiflexion of the ankle.  Thompson squeeze test intact. NVI.  Imaging: No results found.  Past Medical/Family/Surgical/Social History: Medications & Allergies reviewed per EMR, new medications updated. Patient Active Problem List   Diagnosis Date Noted   Persistent atrial fibrillation ([facility])    HNP (herniated nucleus pulposus) with myelopathy, cervical 10/28/2020   Paresthesia 09/26/2020   Cerebrovascular accident (CVA) ([place]) 09/02/2020   Weakness 08/29/2020   Right cervical radiculopathy 08/29/2020   Hyperlipidemia 11/24/2019   Anxiety disorder, unspecified 11/25/2007   ALLERGIC RHINITIS 11/25/2007   History of colonic polyps 11/25/2007   Class 1 obesity with body mass index (BMI) of 32.0 to 32.9 in adult  11/24/2006   PANIC DISORDER 11/24/2006   Essential hypertension 11/24/2006   PAROXYSMAL SUPRAVENTRICULAR TACHYCARDIA 11/24/2006   Past Medical History:  Diagnosis Date   Allergic rhinitis    Allergy    Anxiety    Arthritis     Cataract    Heart murmur    History of colonic polyps    History of PSVT (paroxysmal supraventricular tachycardia)    Hypertension    Impaired glucose tolerance    Obesity    PAT (paroxysmal atrial tachycardia)    Pre-diabetes    hx, not currently being monitor for diabetes, diet controlled   Stroke ([place]) 08/2020   Scan revealed stroke in history   Tobacco user    Vertigo    started 3 weeks ago- after fluid trapped in ear   Family History  Problem Relation Age of Onset   Healthy Mother    Dementia Father    Healthy Sister    Healthy Sister    Alzheimer's disease Other    Colon cancer Neg Hx    Esophageal cancer Neg Hx    Rectal cancer Neg Hx    Stomach cancer Neg Hx    Colon polyps Neg Hx    Past Surgical History:  Procedure Laterality Date   ankle sx-left fx     ANTERIOR CERVICAL DECOMP/DISCECTOMY FUSION N/A 10/28/2020   Procedure: Anterior Cervical Decompression Fusion - Cervical Four-Cervical Five - Cervical Five-Cervical Six - Cervical Six- Cervical Seven;  Surgeon: Kary Kos, MD;  Location: [place];  Service: Neurosurgery;  Laterality: N/A;  Anterior Cervical Decompression Fusion - Cervical Four-Cervical Five - Cervical Five-Cervical Six - Cervical Six- Cervical Seven   ATRIAL FIBRILLATION ABLATION N/A 09/05/2021   Procedure: ATRIAL FIBRILLATION ABLATION;  Surgeon: Constance Haw, MD;  Location: [place] [facility];  Service: Cardiovascular;  Laterality: N/A;   CARDIOVERSION N/A 11/28/2020   Procedure: CARDIOVERSION;  Surgeon: Josue Hector, MD;  Location: [facility] ENDOSCOPY;  Service: Cardiovascular;  Laterality: N/A;   CARDIOVERSION N/A 04/15/2021   Procedure: CARDIOVERSION;  Surgeon: [name], MD;  Location: [place];  Service: Cardiovascular;  Laterality: N/A;   CATARACT EXTRACTION     both eyes   CHOLECYSTECTOMY N/A 01/11/2018   Procedure: LAPAROSCOPIC CHOLECYSTECTOMY WITH INTRAOPERATIVE CHOLANGIOGRAM ERAS PATHWAY;  Surgeon: Alphonsa Overall, MD;  Location:  [place];  Service: General;  Laterality: N/A;   COLONOSCOPY  04/23/2005   DIAGNOSTIC LAPAROSCOPY     EYE SURGERY     foot sx     with the ankle surgery   POLYPECTOMY     Social History   Occupational History   Occupation: Retired  Tobacco Use   Smoking status: Former    Packs/day: 0.25    Years: 50.00    Total pack years: 12.50    Types: Cigarettes    Quit date: 10/17/2020    Years since quitting: 1.5   Smokeless tobacco: Never  Vaping Use   Vaping Use: Never used  Substance and Sexual Activity   Alcohol use: Yes    Alcohol/week: 2.0 standard drinks of alcohol    Types: 2 Standard drinks or equivalent per week    Comment: weekends   Drug use: No   Sexual activity: Yes

## 2022-05-20 NOTE — Progress Notes (Signed)
Glenda [name] is doing good She feels some improvement with pain since these treatments She is wearing the patches that were prescribed but unsure how much it is helping

## 2022-05-22 ENCOUNTER — Ambulatory Visit
Admission: RE | Admit: 2022-05-22 | Discharge: 2022-05-22 | Disposition: A | Discharge status: Home / Self Care | Payer: Medicare PPO | Source: Ambulatory Visit

## 2022-05-22 DIAGNOSIS — Z1231 Encounter for screening mammogram for malignant neoplasm of breast: Secondary | ICD-10-CM

## 2022-05-25 DIAGNOSIS — G4733 Obstructive sleep apnea (adult) (pediatric): Secondary | ICD-10-CM | POA: Diagnosis not present

## 2022-05-27 ENCOUNTER — Ambulatory Visit: Payer: Medicare PPO | Admitting: Sports Medicine

## 2022-05-27 ENCOUNTER — Encounter: Payer: Self-pay | Admitting: Sports Medicine

## 2022-05-27 DIAGNOSIS — M7662 Achilles tendinitis, left leg: Secondary | ICD-10-CM

## 2022-05-27 NOTE — Progress Notes (Signed)
Doing good; minimal pain Shockwave is working for her

## 2022-05-27 NOTE — Progress Notes (Signed)
   Procedure Note  HPI: Glenda [name] is a pleasant 72 y.o. female who presents today for follow-up of left achilles tendinitis/tendinopathy. She presents for shockwave therapy #8 today.  She is currently finding benefit, feels like she is at least 75% improved from initiation of treatment.  Does feel like she is finding benefit from the nitroglycerin patch.  Patient: Glenda [name]             Date of Birth: 12-Dec-1950           MRN: [id number]             Visit Date: 05/27/2022  Procedures: Visit Diagnoses:  1. Achilles tendinitis, left leg    Procedure Details Consent: Risks of procedure as well as the alternatives and risks of each were explained to the patient.  Verbal consent for procedure obtained. Time Out: Verified patient identification, verified procedure, site was marked, verified correct patient position. The area was cleaned with alcohol swab.    The left achilles tendon was targeted for Extracorporeal shockwave therapy.    Preset: patellar/achilles tendinitis, chronic Power Level: 110 mJ Frequency: 13 Hz  Impulse/cycles: 2900 Head size: Regular   Patient tolerated procedure well without immediate complications.   -She will follow-up in 1 week for repeat ECSWT -After that treatment we will likely take a break for a month or so and see how she does with continuing home exercise and the nitroglycerin patch.  Likely follow-up at that point with a regular visit to discuss continuation of nitroglycerin patch and other treatment modalities.  Elba Barman, DO Primary Care Sports Medicine Physician  [place]  This note was dictated using Dragon naturally speaking software and may contain errors in syntax, spelling, or content which have not been identified prior to signing this note.

## 2022-06-04 ENCOUNTER — Ambulatory Visit: Payer: Medicare PPO | Admitting: Sports Medicine

## 2022-06-04 ENCOUNTER — Encounter: Payer: Self-pay | Admitting: Sports Medicine

## 2022-06-04 DIAGNOSIS — M7662 Achilles tendinitis, left leg: Secondary | ICD-10-CM

## 2022-06-04 NOTE — Progress Notes (Signed)
   Procedure Note  Patient: Glenda [name]             Date of Birth: May 30, 1950           MRN: [id number]             Visit Date: 06/04/2022  HPI: Glenda [name] is a very pleasant 72 y.o. female who presents today for follow-up of left achilles tendinitis/tendinopathy. She presents for shockwave therapy #9 today.  She continues to experience great improvement with shockwave and HEP. Felt like this last week she noticed improvement with heel raising exercises and achilles strength  Continues with the nitroglycerin patch (prescribed 04/28/22).   Procedures: Visit Diagnoses:  1. Achilles tendinitis, left leg    Procedure Details Consent: Risks of procedure as well as the alternatives and risks of each were explained to the patient.  Verbal consent for procedure obtained. Time Out: Verified patient identification, verified procedure, site was marked, verified correct patient position. The area was cleaned with alcohol swab.     The left achilles tendon was targeted for Extracorporeal shockwave therapy.    Preset: patellar/achilles tendinitis, chronic Power Level: 110 mJ Frequency: 13 Hz  Impulse/cycles: 3000 Head size: Regular   Patient tolerated procedure well without immediate complications.  Discussed with Ulla since she has had great improvement with the shockwave therapy and HEP, we will take a 1 month break at this point and reevaluate to see how she is doing at that time. She will continue her HEP and the nitroglycerin patch protocol.  - F/u 1 month  Elba Barman, DO [place]  This note was dictated using Dragon naturally speaking software and may contain errors in syntax, spelling, or content which have not been identified prior to signing this note.

## 2022-06-04 NOTE — Progress Notes (Signed)
Doing great; feels like these treatments have worked.

## 2022-06-10 ENCOUNTER — Encounter (HOSPITAL_COMMUNITY): Payer: Self-pay | Admitting: Physician Assistant

## 2022-06-10 ENCOUNTER — Ambulatory Visit (HOSPITAL_COMMUNITY)
Admission: RE | Admit: 2022-06-10 | Discharge: 2022-06-10 | Disposition: A | Discharge status: Home / Self Care | Payer: Medicare PPO | Source: Ambulatory Visit | Attending: Physician Assistant | Admitting: Physician Assistant

## 2022-06-10 VITALS — BP 138/82 | HR 94 | Ht 67.0 in | Wt 216.6 lb

## 2022-06-10 DIAGNOSIS — Z79899 Other long term (current) drug therapy: Secondary | ICD-10-CM | POA: Diagnosis not present

## 2022-06-10 DIAGNOSIS — Z87891 Personal history of nicotine dependence: Secondary | ICD-10-CM | POA: Diagnosis not present

## 2022-06-10 DIAGNOSIS — I4819 Other persistent atrial fibrillation: Secondary | ICD-10-CM | POA: Diagnosis not present

## 2022-06-10 DIAGNOSIS — G4733 Obstructive sleep apnea (adult) (pediatric): Secondary | ICD-10-CM | POA: Diagnosis not present

## 2022-06-10 DIAGNOSIS — Z006 Encounter for examination for normal comparison and control in clinical research program: Secondary | ICD-10-CM

## 2022-06-10 DIAGNOSIS — D6869 Other thrombophilia: Secondary | ICD-10-CM | POA: Diagnosis not present

## 2022-06-10 DIAGNOSIS — I10 Essential (primary) hypertension: Secondary | ICD-10-CM | POA: Insufficient documentation

## 2022-06-10 DIAGNOSIS — Z7901 Long term (current) use of anticoagulants: Secondary | ICD-10-CM | POA: Insufficient documentation

## 2022-06-10 MED ORDER — FLECAINIDE ACETATE 100 MG PO TABS: 100.0000 mg | ORAL_TABLET | Freq: Two times a day (BID) | ORAL | Status: DC

## 2022-06-10 NOTE — Patient Instructions (Signed)
Start Flecainide '100mg'$  twice a day

## 2022-06-10 NOTE — Research (Signed)
[facility] Informed Consent   Subject Name: Glenda [name]  Subject met inclusion and exclusion criteria.  The informed consent form, study requirements and expectations were reviewed with the subject and questions and concerns were addressed prior to the signing of the consent form.  The subject verbalized understanding of the trial requirements.  The subject agreed to participate in the [facility] trial and signed the informed consent on 24/Jan/2024.  The informed consent was obtained prior to performance of any protocol-specific procedures for the subject.  A copy of the signed informed consent was given to the subject and a copy was placed in the subject's medical record.   Tori Milks [place] Baris

## 2022-06-10 NOTE — Progress Notes (Signed)
Primary Care Physician: [place], Betty G, MD Referring Physician: [facility] f/u  Primary EP: Dr Francee Gentile is a 72 y.o. female with a h/o new onset afib that is in the afib clinic for f/u hosptial stay 6/13. She had neck surgery at that time. Afib was noted on pre op EKG and also at time of echo in April that was done for pre op clearance, although pt was not notified. While  in the hospital for neck surgery, afib was present and she was consulted by cardiology. She was started on BB for rate control and with CHA2DS2VASc score of 6, she was started on eliquis 72 hours after surgery. She was not aware of her afib.   In the office today, she remains in afib rate controlled. She reports that she has quit smoking. She drinks 2 alcoholic drinks a week. Husband reports that she has had snoring and apnea in the past but he does not note apnea now although she still has some snoring. She does not exercise.   F/u afib clinic, 6/30, remains in a fib at 86 bpm. She will be able to have cardioversion after 7/8 to allow for adequate anticoagulation. She has not missed any anticoagulation since start of drug, 6/17. She is tolerating well.   F/u in afib clinic 12/04/20. Unfortunately, cardioversion did not convert pt despite multiple shocks prior with manual compression. She is here to discuss options. She has developed a rash of her neck, arms torso that was there to a small degree prior to CV but has gotten worse since. Benadryl has not helped with the itching. Received propofol with CV and  in the past  has not shown to be allergic.   F/u in afib clinic, 01/16/21. She is in rate controlled afib today. She feels she may be going in and out. She  feels the BB is making her fatigued and teary at times. Discussed switching to cardizem daily. She brings in BP readings for the last several weeks. Overall BP looks well controlled. She would like to see what the monitor shows before she firmly makes her mind to  pursue antiarrythmic's.   F/u in afib clinic, 02/18/21. She remains in rate controlled afib. She is not symptomatic. Recent monitor did show 100% afib burden as pt thought she may be going in and out. She is pending another echo 10/20.    F/u in afib clinic, 03/25/21, after starting on flecainide with echo showing normal EF and with a low risk stress test. She is on 50 mg bid and will increase to 100 mg bid and I will see back in one week to discuss cardioversion. BP elevated here but BP at home controlled.   F/u in afib clinic 04/22/21. She had a successful cardioversion 11/29. Dr. Debara Pickett reduced PM metoprolol as she had bradycardia on return to SR. EKG today shows afib rate controlled. She does not feel she was in SR long enough to appreciate if she felt better. I discussed seeing EP to discuss ablation and she is in agreement. Last echo showed normal left atrial size.   F/u in afib clinic, 10/07/21, one month after ablation. She is in SR today with low amplitude p waves. She has noted some early beats, but no afib. She is also back to her baseline activities.  No swallowing or groin issues.   Follow up in the AF clinic 06/10/22. Patient reports that she feels well today. She is surprised to hear  she is in afib today, rate controlled. She did have COVID about two months ago. She denies any bleeding issues on anticoagulation.   Today, she denies symptoms of palpitations, chest pain, shortness of breath, orthopnea, PND, lower extremity edema, dizziness, presyncope, syncope, or neurologic sequela. The patient is tolerating medications without difficulties and is otherwise without complaint today.   Past Medical History:  Diagnosis Date   Allergic rhinitis    Allergy    Anxiety    Arthritis    Cataract    Heart murmur    History of colonic polyps    History of PSVT (paroxysmal supraventricular tachycardia)    Hypertension    Impaired glucose tolerance    Obesity    PAT (paroxysmal atrial  tachycardia)    Pre-diabetes    hx, not currently being monitor for diabetes, diet controlled   Stroke ([place]) 08/2020   Scan revealed stroke in history   Tobacco user    Vertigo    started 3 weeks ago- after fluid trapped in ear   Past Surgical History:  Procedure Laterality Date   ankle sx-left fx     ANTERIOR CERVICAL DECOMP/DISCECTOMY FUSION N/A 10/28/2020   Procedure: Anterior Cervical Decompression Fusion - Cervical Four-Cervical Five - Cervical Five-Cervical Six - Cervical Six- Cervical Seven;  Surgeon: Kary Kos, MD;  Location: [facility];  Service: Neurosurgery;  Laterality: N/A;  Anterior Cervical Decompression Fusion - Cervical Four-Cervical Five - Cervical Five-Cervical Six - Cervical Six- Cervical Seven   ATRIAL FIBRILLATION ABLATION N/A 09/05/2021   Procedure: ATRIAL FIBRILLATION ABLATION;  Surgeon: Constance Haw, MD;  Location: [facility];  Service: Cardiovascular;  Laterality: N/A;   CARDIOVERSION N/A 11/28/2020   Procedure: CARDIOVERSION;  Surgeon: Josue Hector, MD;  Location: [facility];  Service: Cardiovascular;  Laterality: N/A;   CARDIOVERSION N/A 04/15/2021   Procedure: CARDIOVERSION;  Surgeon: [name], MD;  Location: [facility];  Service: Cardiovascular;  Laterality: N/A;   CATARACT EXTRACTION     both eyes   CHOLECYSTECTOMY N/A 01/11/2018   Procedure: LAPAROSCOPIC CHOLECYSTECTOMY WITH INTRAOPERATIVE CHOLANGIOGRAM ERAS PATHWAY;  Surgeon: Alphonsa Overall, MD;  Location: [facility];  Service: General;  Laterality: N/A;   COLONOSCOPY  04/23/2005   DIAGNOSTIC LAPAROSCOPY     EYE SURGERY     foot sx     with the ankle surgery   POLYPECTOMY      Current Outpatient Medications  Medication Sig Dispense Refill   acetaminophen (TYLENOL) 500 MG tablet Take 1,000 mg by mouth 2 (two) times daily as needed for moderate pain.     ALPRAZolam (XANAX) 0.25 MG tablet Take 1 tablet (0.25 mg total) by mouth daily as needed. for anxiety 30 tablet 1   diclofenac  Sodium (VOLTAREN) 1 % GEL Apply 4 g topically 4 (four) times daily. 30 g 0   diphenhydramine-acetaminophen (TYLENOL PM) 25-500 MG TABS tablet Take 1 tablet by mouth at bedtime as needed (sleep).     ELIQUIS 5 MG TABS tablet TAKE 1 TABLET BY MOUTH TWICE A DAY 180 tablet 1   flecainide (TAMBOCOR) 100 MG tablet Take 1 tablet (100 mg total) by mouth 2 (two) times daily.     hydrochlorothiazide (HYDRODIURIL) 25 MG tablet Take 0.5 tablets (12.5 mg total) by mouth daily. 45 tablet 3   metoprolol succinate (TOPROL XL) 25 MG 24 hr tablet Take 1 tablet (25 mg total) by mouth at bedtime.     nitroGLYCERIN (NITRODUR - DOSED IN MG/24 HR) 0.2 mg/hr  patch Cut patch into fourths. Place 1/4 patch over affected area on achilles. Change patch every 24 hours. 30 patch 2   PARoxetine (PAXIL) 40 MG tablet TAKE 1 TABLET BY MOUTH EVERY DAY 90 tablet 2   rosuvastatin (CRESTOR) 10 MG tablet TAKE 1 TABLET BY MOUTH EVERY DAY 90 tablet 1   No current facility-administered medications for this encounter.    Allergies  Allergen Reactions   Amoxicillin Rash and Other (See Comments)    Has patient had a PCN reaction causing immediate rash, facial/tongue/throat swelling, SOB or lightheadedness with hypotension: No HAS PT DEVELOPED SEVERE RASH INVOLVING MUCUS MEMBRANES or SKIN NECROSIS: #  #  YES  #  #  Has patient had a PCN reaction that required hospitalization: No Has patient had a PCN reaction occurring within the last 10 years: No    Erythromycin Other (See Comments)    Upset stomach    Social History   Socioeconomic History   Marital status: Married    Spouse name: Not on file   Number of children: 2   Years of education: college   Highest education level: Not on file  Occupational History   Occupation: Retired  Tobacco Use   Smoking status: Former    Packs/day: 0.25    Years: 50.00    Total pack years: 12.50    Types: Cigarettes    Quit date: 10/17/2020    Years since quitting: 1.6   Smokeless tobacco:  Never   Tobacco comments:    Former smoker 06/10/22  Vaping Use   Vaping Use: Never used  Substance and Sexual Activity   Alcohol use: Yes    Alcohol/week: 2.0 standard drinks of alcohol    Types: 2 Standard drinks or equivalent per week    Comment: weekends 06/10/22   Drug use: No   Sexual activity: Yes  Other Topics Concern   Not on file  Social History Narrative   Lives at home with her husband.   Right-handed.   One cup caffeine per day.   Social Determinants of Health   Financial Resource Strain: Low Risk  (02/19/2022)   Overall Financial Resource Strain (CARDIA)    Difficulty of Paying Living Expenses: Not hard at all  Food Insecurity: No Food Insecurity (03/13/2022)   Hunger Vital Sign    Worried About Running Out of Food in the Last Year: Never true    Ran Out of Food in the Last Year: Never true  Transportation Needs: No Transportation Needs (03/13/2022)   PRAPARE - Hydrologist (Medical): No    Lack of Transportation (Non-Medical): No  Physical Activity: Inactive (02/19/2022)   Exercise Vital Sign    Days of Exercise per Week: 0 days    Minutes of Exercise per Session: 0 min  Stress: No Stress Concern Present (02/19/2022)   Tatums    Feeling of Stress : Not at all  Social Connections: Moderately Isolated (02/19/2022)   Social Connection and Isolation Panel [NHANES]    Frequency of Communication with Friends and Family: More than three times a week    Frequency of Social Gatherings with Friends and Family: More than three times a week    Attends Religious Services: Never    Marine scientist or Organizations: No    Attends Archivist Meetings: Never    Marital Status: Married  Human resources officer Violence: Not At Risk (02/19/2022)   Humiliation, Afraid,  Rape, and Kick questionnaire    Fear of Current or Ex-Partner: No    Emotionally Abused: No     Physically Abused: No    Sexually Abused: No    Family History  Problem Relation Age of Onset   Healthy Mother    Dementia Father    Healthy Sister    Healthy Sister    Alzheimer's disease Other    Colon cancer Neg Hx    Esophageal cancer Neg Hx    Rectal cancer Neg Hx    Stomach cancer Neg Hx    Colon polyps Neg Hx     ROS- All systems are reviewed and negative except as per the HPI above  Physical Exam: Vitals:   06/10/22 0922  BP: 138/82  Pulse: 94  Weight: 98.2 kg  Height: '5\' 7"'$  (1.702 m)     Wt Readings from Last 3 Encounters:  06/10/22 98.2 kg  03/10/22 98 kg  02/19/22 99.3 kg    Labs: Lab Results  Component Value Date   NA 140 03/10/2022   K 4.2 03/10/2022   CL 99 03/10/2022   CO2 32 03/10/2022   GLUCOSE 99 03/30/2022   BUN 17 03/10/2022   CREATININE 0.71 03/10/2022   CALCIUM 9.8 03/10/2022   MG 1.9 10/28/2020   No results found for: "INR" Lab Results  Component Value Date   CHOL 107 03/10/2022   HDL 43.90 03/10/2022   LDLCALC 47 03/10/2022   TRIG 82.0 03/10/2022     GEN- The patient is a well appearing female, alert and oriented x 3 today.   HEENT-head normocephalic, atraumatic, sclera clear, conjunctiva pink, hearing intact, trachea midline. Lungs- Clear to ausculation bilaterally, normal work of breathing Heart- irregular rate and rhythm, no murmurs, rubs or gallops  GI- soft, NT, ND, + BS Extremities- no clubbing, cyanosis, or edema MS- no significant deformity or atrophy Skin- no rash or lesion Psych- euthymic mood, full affect Neuro- strength and sensation are intact   EKG- Afib Vent. rate 94 BPM PR interval * ms QRS duration 82 ms QT/QTcB 330/412 ms   Echo- 03/06/21- 1. Left ventricular ejection fraction, by estimation, is 60 to 65%. The left ventricle has normal function. The left ventricle has no regional wall motion abnormalities. Left ventricular diastolic parameters are indeterminate.   2. Right ventricular systolic  function is normal. The right ventricular  size is normal. There is normal pulmonary artery systolic pressure.   3. The mitral valve is normal in structure. No evidence of mitral valve regurgitation. No evidence of mitral stenosis.   4. The aortic valve was not well visualized. Aortic valve regurgitation  is not visualized.   5. The inferior vena cava is normal in size with greater than 50%  respiratory variability, suggesting right atrial pressure of 3 mmHg.    CHA2DS2-VASc Score = 6  The patient's score is based upon: CHF History: 0 HTN History: 1 Diabetes History: 0 Stroke History: 2 Vascular Disease History: 1 (aortic atherosclerosis) Age Score: 1 Gender Score: 1       ASSESSMENT AND PLAN: 1. Persistent Atrial Fibrillation (ICD10:  I48.19) The patient's CHA2DS2-VASc score is 6, indicating a 9.7% annual risk of stroke.   S/p afib ablation 09/05/21, now off flecainide and diltiazem. Patient in rate controlled afib today, asymptomatic. ? If related to recent COVID infection. We discussed rhythm control options. Will resume flecainide 100 mg BID. If she does not convert chemically, will plan for DCCV.  Continue Toprol 25 mg daily  Continue Eliquis 5 mg BID  2. Secondary Hypercoagulable State (ICD10:  D68.69) The patient is at significant risk for stroke/thromboembolism based upon her CHA2DS2-VASc Score of 6.  Continue Apixaban (Eliquis).   3. OSA Encouraged compliance with CPAP therapy. Followed by Dr Claiborne Billings  4. HTN Stable, no changes today.   Follow up in the AF clinic next week.    [facility] [address] [place], [place] [address] [phone]

## 2022-06-13 ENCOUNTER — Other Ambulatory Visit (HOSPITAL_COMMUNITY): Payer: Self-pay | Admitting: Nurse Practitioner

## 2022-06-17 ENCOUNTER — Ambulatory Visit (HOSPITAL_COMMUNITY)
Admission: RE | Admit: 2022-06-17 | Discharge: 2022-06-17 | Disposition: A | Discharge status: Home / Self Care | Payer: Medicare PPO | Source: Ambulatory Visit | Attending: Physician Assistant | Admitting: Physician Assistant

## 2022-06-17 DIAGNOSIS — I4819 Other persistent atrial fibrillation: Secondary | ICD-10-CM

## 2022-06-17 NOTE — Patient Instructions (Addendum)
Cardioversion scheduled for:   - Arrive at the [facility] and go to admitting at 8:30am on Thursday February 15th   - Do not eat or drink anything after midnight the night prior to your procedure.   - Take all your morning medication (except diabetic medications) with a sip of water prior to arrival.  - You will not be able to drive home after your procedure.    - Do NOT miss any doses of your blood thinner - if you should miss a dose please notify our office immediately.   - If you feel as if you go back into normal rhythm prior to scheduled cardioversion, please notify our office immediately.  If your procedure is canceled in the cardioversion suite you will be charged a cancellation fee.   If you are on weekly OZEMPIC, TRULICITY, MOUNJARO, WEGOVY, OR BYDUREON  Hold medication 7 days prior to scheduled procedure/anesthesia.  Restart medication on the normal dosing day after scheduled procedure/anesthesia  If you are on daily BYETTA, VICTOZA, ADLYXIN, OR RYBELSUS:   Hold medication 24 hours prior to scheduled procedure/anesthesia.   Restart medication on the following day after scheduled procedure/anesthesia   For those patients who have a scheduled procedure/anesthesia on the same day of the week as their dose, hold the medication on the day of surgery.  They can take their scheduled dose the week before.  **Patients on the above medications scheduled for elective procedures that have not held the medication for the appropriate amount of time are at risk of cancellation or change in the anesthetic plan.

## 2022-06-17 NOTE — Progress Notes (Signed)
Patient returns for ECG after starting flecainide. ECG shows:  Afib Vent. rate 65 BPM PR interval * ms QRS duration 100 ms QT/QTcB 410/426 ms  Will proceed with scheduling DCCV. F/u in the AF clinic 1 week post DCCV. Continue flecainide 100 mg BID

## 2022-06-22 ENCOUNTER — Other Ambulatory Visit (HOSPITAL_COMMUNITY): Payer: Self-pay | Admitting: *Deleted

## 2022-06-25 ENCOUNTER — Encounter (HOSPITAL_COMMUNITY): Payer: Self-pay | Admitting: *Deleted

## 2022-06-25 DIAGNOSIS — G4733 Obstructive sleep apnea (adult) (pediatric): Secondary | ICD-10-CM | POA: Diagnosis not present

## 2022-06-30 ENCOUNTER — Other Ambulatory Visit (HOSPITAL_COMMUNITY): Payer: Medicare PPO | Admitting: Physician Assistant

## 2022-06-30 ENCOUNTER — Other Ambulatory Visit (HOSPITAL_COMMUNITY): Payer: Self-pay | Admitting: Physician Assistant

## 2022-07-06 ENCOUNTER — Ambulatory Visit: Payer: Medicare PPO | Admitting: Sports Medicine

## 2022-07-06 ENCOUNTER — Encounter: Payer: Self-pay | Admitting: Sports Medicine

## 2022-07-06 ENCOUNTER — Ambulatory Visit (HOSPITAL_COMMUNITY)
Admission: RE | Admit: 2022-07-06 | Discharge: 2022-07-06 | Disposition: A | Discharge status: Home / Self Care | Payer: Medicare PPO | Source: Ambulatory Visit | Attending: Physician Assistant | Admitting: Physician Assistant

## 2022-07-06 ENCOUNTER — Encounter (HOSPITAL_COMMUNITY): Payer: Self-pay | Admitting: Physician Assistant

## 2022-07-06 VITALS — BP 122/82 | HR 54

## 2022-07-06 DIAGNOSIS — I4819 Other persistent atrial fibrillation: Secondary | ICD-10-CM | POA: Insufficient documentation

## 2022-07-06 DIAGNOSIS — D6869 Other thrombophilia: Secondary | ICD-10-CM

## 2022-07-06 DIAGNOSIS — Z8616 Personal history of COVID-19: Secondary | ICD-10-CM | POA: Diagnosis not present

## 2022-07-06 DIAGNOSIS — Z87891 Personal history of nicotine dependence: Secondary | ICD-10-CM | POA: Insufficient documentation

## 2022-07-06 DIAGNOSIS — I10 Essential (primary) hypertension: Secondary | ICD-10-CM | POA: Insufficient documentation

## 2022-07-06 DIAGNOSIS — G4733 Obstructive sleep apnea (adult) (pediatric): Secondary | ICD-10-CM | POA: Insufficient documentation

## 2022-07-06 DIAGNOSIS — Z7901 Long term (current) use of anticoagulants: Secondary | ICD-10-CM | POA: Diagnosis not present

## 2022-07-06 DIAGNOSIS — Z79899 Other long term (current) drug therapy: Secondary | ICD-10-CM | POA: Diagnosis not present

## 2022-07-06 DIAGNOSIS — M7662 Achilles tendinitis, left leg: Secondary | ICD-10-CM | POA: Diagnosis not present

## 2022-07-06 MED ORDER — FLECAINIDE ACETATE 100 MG PO TABS: 50.0000 mg | ORAL_TABLET | Freq: Two times a day (BID) | ORAL | 1 refills | Status: DC

## 2022-07-06 NOTE — Progress Notes (Signed)
Primary Care Physician: Martinique, Betty G, MD Referring Physician: [facility] f/u  Primary EP: Dr Francee Gentile is a 72 y.o. female with a h/o new onset afib that is in the afib clinic for f/u hosptial stay 6/13. She had neck surgery at that time. Afib was noted on pre op EKG and also at time of echo in April that was done for pre op clearance, although pt was not notified. While  in the hospital for neck surgery, afib was present and she was consulted by cardiology. She was started on BB for rate control and with CHA2DS2VASc score of 6, she was started on eliquis 72 hours after surgery. She was not aware of her afib.   In the office today, she remains in afib rate controlled. She reports that she has quit smoking. She drinks 2 alcoholic drinks a week. Husband reports that she has had snoring and apnea in the past but he does not note apnea now although she still has some snoring. She does not exercise.   F/u afib clinic, 6/30, remains in a fib at 86 bpm. She will be able to have cardioversion after 7/8 to allow for adequate anticoagulation. She has not missed any anticoagulation since start of drug, 6/17. She is tolerating well.   F/u in afib clinic 12/04/20. Unfortunately, cardioversion did not convert pt despite multiple shocks prior with manual compression. She is here to discuss options. She has developed a rash of her neck, arms torso that was there to a small degree prior to CV but has gotten worse since. Benadryl has not helped with the itching. Received propofol with CV and  in the past  has not shown to be allergic.   F/u in afib clinic, 01/16/21. She is in rate controlled afib today. She feels she may be going in and out. She  feels the BB is making her fatigued and teary at times. Discussed switching to cardizem daily. She brings in BP readings for the last several weeks. Overall BP looks well controlled. She would like to see what the monitor shows before she firmly makes her mind to  pursue antiarrythmic's.   F/u in afib clinic, 02/18/21. She remains in rate controlled afib. She is not symptomatic. Recent monitor did show 100% afib burden as pt thought she may be going in and out. She is pending another echo 10/20.    F/u in afib clinic, 03/25/21, after starting on flecainide with echo showing normal EF and with a low risk stress test. She is on 50 mg bid and will increase to 100 mg bid and I will see back in one week to discuss cardioversion. BP elevated here but BP at home controlled.   F/u in afib clinic 04/22/21. She had a successful cardioversion 11/29. Dr. Debara Pickett reduced PM metoprolol as she had bradycardia on return to SR. EKG today shows afib rate controlled. She does not feel she was in SR long enough to appreciate if she felt better. I discussed seeing EP to discuss ablation and she is in agreement. Last echo showed normal left atrial size.   F/u in afib clinic, 10/07/21, one month after ablation. She is in SR today with low amplitude p waves. She has noted some early beats, but no afib. She is also back to her baseline activities.  No swallowing or groin issues.   Follow up in the AF clinic 06/10/22. Patient reports that she feels well today. She is surprised to hear  she is in afib today, rate controlled. She did have COVID about two months ago. She denies any bleeding issues on anticoagulation.   Follow up in the AF clinic 07/06/22. Patient reports that since resuming flecainide she has felt more fatigued. She has noticed heart rates in the upper 40s at times. Fortunately, she is back in SR. No bleeding issues on anticoagulation.   Today, she denies symptoms of palpitations, chest pain, shortness of breath, orthopnea, PND, lower extremity edema, dizziness, presyncope, syncope, or neurologic sequela. The patient is tolerating medications without difficulties and is otherwise without complaint today.   Past Medical History:  Diagnosis Date   Allergic rhinitis    Allergy     Anxiety    Arthritis    Cataract    Heart murmur    History of colonic polyps    History of PSVT (paroxysmal supraventricular tachycardia)    Hypertension    Impaired glucose tolerance    Obesity    PAT (paroxysmal atrial tachycardia)    Pre-diabetes    hx, not currently being monitor for diabetes, diet controlled   Stroke ([place]) 08/2020   Scan revealed stroke in history   Tobacco user    Vertigo    started 3 weeks ago- after fluid trapped in ear   Past Surgical History:  Procedure Laterality Date   ankle sx-left fx     ANTERIOR CERVICAL DECOMP/DISCECTOMY FUSION N/A 10/28/2020   Procedure: Anterior Cervical Decompression Fusion - Cervical Four-Cervical Five - Cervical Five-Cervical Six - Cervical Six- Cervical Seven;  Surgeon: Kary Kos, MD;  Location: [facility];  Service: Neurosurgery;  Laterality: N/A;  Anterior Cervical Decompression Fusion - Cervical Four-Cervical Five - Cervical Five-Cervical Six - Cervical Six- Cervical Seven   ATRIAL FIBRILLATION ABLATION N/A 09/05/2021   Procedure: ATRIAL FIBRILLATION ABLATION;  Surgeon: Constance Haw, MD;  Location: [place] [facility];  Service: Cardiovascular;  Laterality: N/A;   CARDIOVERSION N/A 11/28/2020   Procedure: CARDIOVERSION;  Surgeon: Josue Hector, MD;  Location: [facility]OSCOPY;  Service: Cardiovascular;  Laterality: N/A;   CARDIOVERSION N/A 04/15/2021   Procedure: CARDIOVERSION;  Surgeon: [name], MD;  Location: [place];  Service: Cardiovascular;  Laterality: N/A;   CATARACT EXTRACTION     both eyes   CHOLECYSTECTOMY N/A 01/11/2018   Procedure: LAPAROSCOPIC CHOLECYSTECTOMY WITH INTRAOPERATIVE CHOLANGIOGRAM ERAS PATHWAY;  Surgeon: Alphonsa Overall, MD;  Location: [place];  Service: General;  Laterality: N/A;   COLONOSCOPY  04/23/2005   DIAGNOSTIC LAPAROSCOPY     EYE SURGERY     foot sx     with the ankle surgery   POLYPECTOMY      Current Outpatient Medications  Medication Sig Dispense Refill   acetaminophen  (TYLENOL) 500 MG tablet Take 1,000 mg by mouth 2 (two) times daily as needed for moderate pain.     ALPRAZolam (XANAX) 0.25 MG tablet Take 1 tablet (0.25 mg total) by mouth daily as needed. for anxiety 30 tablet 1   apixaban (ELIQUIS) 5 MG TABS tablet TAKE 1 TABLET BY MOUTH TWICE A DAY 180 tablet 3   diclofenac Sodium (VOLTAREN) 1 % GEL Apply 4 g topically 4 (four) times daily. 30 g 0   diphenhydramine-acetaminophen (TYLENOL PM) 25-500 MG TABS tablet Take 1 tablet by mouth at bedtime as needed (sleep).     flecainide (TAMBOCOR) 100 MG tablet TAKE 1 TABLET BY MOUTH TWICE A DAY 180 tablet 1   hydrochlorothiazide (HYDRODIURIL) 25 MG tablet Take 0.5 tablets (12.5 mg total)  by mouth daily. 45 tablet 3   metoprolol succinate (TOPROL XL) 25 MG 24 hr tablet Take 1 tablet (25 mg total) by mouth at bedtime.     nitroGLYCERIN (NITRODUR - DOSED IN MG/24 HR) 0.2 mg/hr patch Cut patch into fourths. Place 1/4 patch over affected area on achilles. Change patch every 24 hours. 30 patch 2   PARoxetine (PAXIL) 40 MG tablet TAKE 1 TABLET BY MOUTH EVERY DAY 90 tablet 2   rosuvastatin (CRESTOR) 10 MG tablet TAKE 1 TABLET BY MOUTH EVERY DAY 90 tablet 1   No current facility-administered medications for this encounter.    Allergies  Allergen Reactions   Amoxicillin Rash and Other (See Comments)    Has patient had a PCN reaction causing immediate rash, facial/tongue/throat swelling, SOB or lightheadedness with hypotension: No HAS PT DEVELOPED SEVERE RASH INVOLVING MUCUS MEMBRANES or SKIN NECROSIS: #  #  YES  #  #  Has patient had a PCN reaction that required hospitalization: No Has patient had a PCN reaction occurring within the last 10 years: No    Erythromycin Other (See Comments)    Upset stomach    Social History   Socioeconomic History   Marital status: Married    Spouse name: Not on file   Number of children: 2   Years of education: college   Highest education level: Not on file  Occupational History    Occupation: Retired  Tobacco Use   Smoking status: Former    Packs/day: 0.25    Years: 50.00    Total pack years: 12.50    Types: Cigarettes    Quit date: 10/17/2020    Years since quitting: 1.7   Smokeless tobacco: Never   Tobacco comments:    Former smoker 06/10/22  Vaping Use   Vaping Use: Never used  Substance and Sexual Activity   Alcohol use: Yes    Alcohol/week: 2.0 standard drinks of alcohol    Types: 2 Standard drinks or equivalent per week    Comment: weekends 06/10/22   Drug use: No   Sexual activity: Yes  Other Topics Concern   Not on file  Social History Narrative   Lives at home with her husband.   Right-handed.   One cup caffeine per day.   Social Determinants of Health   Financial Resource Strain: Low Risk  (02/19/2022)   Overall Financial Resource Strain (CARDIA)    Difficulty of Paying Living Expenses: Not hard at all  Food Insecurity: No Food Insecurity (03/13/2022)   Hunger Vital Sign    Worried About Running Out of Food in the Last Year: Never true    Ran Out of Food in the Last Year: Never true  Transportation Needs: No Transportation Needs (03/13/2022)   PRAPARE - Hydrologist (Medical): No    Lack of Transportation (Non-Medical): No  Physical Activity: Inactive (02/19/2022)   Exercise Vital Sign    Days of Exercise per Week: 0 days    Minutes of Exercise per Session: 0 min  Stress: No Stress Concern Present (02/19/2022)   [place]    Feeling of Stress : Not at all  Social Connections: Moderately Isolated (02/19/2022)   Social Connection and Isolation Panel [NHANES]    Frequency of Communication with Friends and Family: More than three times a week    Frequency of Social Gatherings with Friends and Family: More than three times a week    Attends  Religious Services: Never    Active Member of Clubs or Organizations: No    Attends Archivist  Meetings: Never    Marital Status: Married  Human resources officer Violence: Not At Risk (02/19/2022)   Humiliation, Afraid, Rape, and Kick questionnaire    Fear of Current or Ex-Partner: No    Emotionally Abused: No    Physically Abused: No    Sexually Abused: No    Family History  Problem Relation Age of Onset   Healthy Mother    Dementia Father    Healthy Sister    Healthy Sister    Alzheimer's disease Other    Colon cancer Neg Hx    Esophageal cancer Neg Hx    Rectal cancer Neg Hx    Stomach cancer Neg Hx    Colon polyps Neg Hx     ROS- All systems are reviewed and negative except as per the HPI above  Physical Exam: Vitals:   07/06/22 1113  BP: 122/82  Pulse: (!) 54     Wt Readings from Last 3 Encounters:  06/10/22 98.2 kg  03/10/22 98 kg  02/19/22 99.3 kg    Labs: Lab Results  Component Value Date   NA 140 03/10/2022   K 4.2 03/10/2022   CL 99 03/10/2022   CO2 32 03/10/2022   GLUCOSE 99 03/30/2022   BUN 17 03/10/2022   CREATININE 0.71 03/10/2022   CALCIUM 9.8 03/10/2022   MG 1.9 10/28/2020   No results found for: "INR" Lab Results  Component Value Date   CHOL 107 03/10/2022   HDL 43.90 03/10/2022   LDLCALC 47 03/10/2022   TRIG 82.0 03/10/2022    GEN- The patient is a well appearing female, alert and oriented x 3 today.   HEENT-head normocephalic, atraumatic, sclera clear, conjunctiva pink, hearing intact, trachea midline. Lungs- Clear to ausculation bilaterally, normal work of breathing Heart- Regular rate and rhythm, occasional ectopic beat, no murmurs, rubs or gallops  GI- soft, NT, ND, + BS Extremities- no clubbing, cyanosis, or edema MS- no significant deformity or atrophy Skin- no rash or lesion Psych- euthymic mood, full affect Neuro- strength and sensation are intact   EKG- SB Vent. rate 54 BPM PR interval 190 ms QRS duration 98 ms QT/QTcB 436/413 ms   Echo- 03/06/21- 1. Left ventricular ejection fraction, by estimation, is 60 to  65%. The left ventricle has normal function. The left ventricle has no regional wall motion abnormalities. Left ventricular diastolic parameters are indeterminate.   2. Right ventricular systolic function is normal. The right ventricular  size is normal. There is normal pulmonary artery systolic pressure.   3. The mitral valve is normal in structure. No evidence of mitral valve regurgitation. No evidence of mitral stenosis.   4. The aortic valve was not well visualized. Aortic valve regurgitation  is not visualized.   5. The inferior vena cava is normal in size with greater than 50%  respiratory variability, suggesting right atrial pressure of 3 mmHg.    CHA2DS2-VASc Score = 6  The patient's score is based upon: CHF History: 0 HTN History: 1 Diabetes History: 0 Stroke History: 2 Vascular Disease History: 1 (aortic atherosclerosis) Age Score: 1 Gender Score: 1       ASSESSMENT AND PLAN: 1. Persistent Atrial Fibrillation (ICD10:  I48.19) The patient's CHA2DS2-VASc score is 6, indicating a 9.7% annual risk of stroke.   S/p afib ablation 09/05/21 Patient has chemically converted back to SR, will cancel DCCV.  Decrease flecainide  to 50 mg BID Continue Toprol 25 mg daily Continue Eliquis 5 mg BID Apple Watch for home monitoring.   2. Secondary Hypercoagulable State (ICD10:  D68.69) The patient is at significant risk for stroke/thromboembolism based upon her CHA2DS2-VASc Score of 6.  Continue Apixaban (Eliquis).   3. OSA Encouraged compliance with CPAP therapy. Followed by Dr Claiborne Billings  4. HTN Stable, no changes today.   Follow up in the AF clinic in 6-8 weeks.    [facility] [address] [place], [place] [address] [phone]

## 2022-07-06 NOTE — Patient Instructions (Signed)
Decrease flecainide to 50mg  twice a day

## 2022-07-06 NOTE — Progress Notes (Signed)
Glenda [name] - 72 y.o. female MRN [id number]  Date of birth: 1950-06-04  Office Visit Note: Visit Date: 07/06/2022 PCP: Martinique, Betty G, MD Referred by: Martinique, Betty G, MD  Subjective: Chief Complaint  Patient presents with   Left Ankle - Follow-up    Says that it is doing much better.   HPI: Glenda [name] is a pleasant 72 y.o. female who presents today for follow-up of left achilles tendinopathy.   Did perform 9 sessions of ECSWT. Last was on 06/04/22. Nitro patch protocol started on 04/29/23.  She is doing great. She feels she is at least 95% improved from the start of treatments. Continue to find improvement even after stopping shockwave. Still using nitroglycerin patch daily.  Pertinent ROS were reviewed with the patient and found to be negative unless otherwise specified above in HPI.   Assessment & Plan: Visit Diagnoses:  1. Achilles tendinitis, left leg    Plan: Discussed with Glenda [name] that we both are happy that she has had such good improvement.  At this point, I think it is safe to discontinue shockwave treatments.  She will continue her nitroglycerin patch over the affected heel/Achilles for 3 additional weeks, at this point she will be at the 7-monthmark.  She may then discontinue this.  I would like her to decrease her home exercises from once daily to every 2-3 times per week.  This is more for maintenance.  We will leave follow-up on an as-needed basis.  Follow-up: Return if symptoms worsen or fail to improve.   Meds & Orders: No orders of the defined types were placed in this encounter.  No orders of the defined types were placed in this encounter.   Procedures: No procedures performed      Clinical History: No specialty comments available.  She reports that she quit smoking about 20 months ago. Her smoking use included cigarettes. She has a 12.50 pack-year smoking history. She has never used smokeless tobacco.  Recent Labs    03/10/22 0943 03/30/22 0956   HGBA1C 6.5 6.2    Objective:    Physical Exam  Gen: Well-appearing, in no acute distress; non-toxic CV: Well-perfused. Warm.  Resp: Breathing unlabored on room air; no wheezing. Psych: Fluid speech in conversation; appropriate affect; normal thought process Neuro: Sensation intact throughout. No gross coordination deficits.   Ortho Exam - Left leg/ankle: There is some chronic thickening over the mid aspect of the Achilles on the left without surrounding erythema or soft tissue swelling.  Negative calcaneal heel squeeze.  Dorsiflexion of the ankle about 120 degrees, full range plantarflexion.  She has full strength at the ankle and able to perform bilateral heel raise without difficulty. NVI.  Imaging: No results found.  Past Medical/Family/Surgical/Social History: Medications & Allergies reviewed per EMR, new medications updated. Patient Active Problem List   Diagnosis Date Noted   Hypercoagulable state due to persistent atrial fibrillation ([facility] [facility] 06/10/2022   Persistent atrial fibrillation ([facility])    HNP (herniated nucleus pulposus) with myelopathy, cervical 10/28/2020   Paresthesia 09/26/2020   C[place] ([place]) [place] 09/02/2020   Weakness 08/29/2020   Right cervical radiculopathy 08/29/2020   Hyperlipidemia 11/24/2019   Anxiety disorder, unspecified 11/25/2007   ALLERGIC RHINITIS 11/25/2007   History of colonic polyps 11/25/2007   Class 1 obesity with body mass index (BMI) of 32.0 to 32.9 in adult 11/24/2006   PANIC DISORDER 11/24/2006   Essential hypertension 11/24/2006   PAROXYSMAL SUPRAVENTRICULAR TACHYCARDIA 11/24/2006   Past Medical  History:  Diagnosis Date   Allergic rhinitis    Allergy    Anxiety    Arthritis    Cataract    Heart murmur    History of colonic polyps    History of PSVT (paroxysmal supraventricular tachycardia)    Hypertension    Impaired glucose tolerance    Obesity    PAT (paroxysmal atrial tachycardia)    Pre-diabetes    hx,  not currently being monitor for diabetes, diet controlled   Stroke ([place]) 08/2020   Scan revealed stroke in history   Tobacco user    Vertigo    started 3 weeks ago- after fluid trapped in ear   Family History  Problem Relation Age of Onset   Healthy Mother    Dementia Father    Healthy Sister    Healthy Sister    Alzheimer's disease Other    Colon cancer Neg Hx    Esophageal cancer Neg Hx    Rectal cancer Neg Hx    Stomach cancer Neg Hx    Colon polyps Neg Hx    Past Surgical History:  Procedure Laterality Date   ankle sx-left fx     ANTERIOR CERVICAL DECOMP/DISCECTOMY FUSION N/A 10/28/2020   Procedure: Anterior Cervical Decompression Fusion - Cervical Four-Cervical Five - Cervical Five-Cervical Six - Cervical Six- Cervical Seven;  Surgeon: Kary Kos, MD;  Location: [place];  Service: Neurosurgery;  Laterality: N/A;  Anterior Cervical Decompression Fusion - Cervical Four-Cervical Five - Cervical Five-Cervical Six - Cervical Six- Cervical Seven   ATRIAL FIBRILLATION ABLATION N/A 09/05/2021   Procedure: ATRIAL FIBRILLATION ABLATION;  Surgeon: Constance Haw, MD;  Location: [place] [facility];  Service: Cardiovascular;  Laterality: N/A;   CARDIOVERSION N/A 11/28/2020   Procedure: CARDIOVERSION;  Surgeon: Josue Hector, MD;  Location: [place];  Service: Cardiovascular;  Laterality: N/A;   CARDIOVERSION N/A 04/15/2021   Procedure: CARDIOVERSION;  Surgeon: [name], MD;  Location: [place];  Service: Cardiovascular;  Laterality: N/A;   CATARACT EXTRACTION     both eyes   CHOLECYSTECTOMY N/A 01/11/2018   Procedure: LAPAROSCOPIC CHOLECYSTECTOMY WITH INTRAOPERATIVE CHOLANGIOGRAM ERAS PATHWAY;  Surgeon: Alphonsa Overall, MD;  Location: [place];  Service: General;  Laterality: N/A;   COLONOSCOPY  04/23/2005   DIAGNOSTIC LAPAROSCOPY     EYE SURGERY     foot sx     with the ankle surgery   POLYPECTOMY     Social History   Occupational History   Occupation: Retired   Tobacco Use   Smoking status: Former    Packs/day: 0.25    Years: 50.00    Total pack years: 12.50    Types: Cigarettes    Quit date: 10/17/2020    Years since quitting: 1.7   Smokeless tobacco: Never   Tobacco comments:    Former smoker 06/10/22  Vaping Use   Vaping Use: Never used  Substance and Sexual Activity   Alcohol use: Yes    Alcohol/week: 2.0 standard drinks of alcohol    Types: 2 Standard drinks or equivalent per week    Comment: weekends 06/10/22   Drug use: No   Sexual activity: Yes

## 2022-07-09 ENCOUNTER — Ambulatory Visit (HOSPITAL_COMMUNITY): Payer: Medicare PPO | Admitting: Physician Assistant

## 2022-07-13 ENCOUNTER — Encounter (HOSPITAL_COMMUNITY): Payer: Self-pay

## 2022-07-13 ENCOUNTER — Ambulatory Visit (HOSPITAL_COMMUNITY): Admit: 2022-07-13 | Payer: Medicare PPO | Admitting: Internal Medicine

## 2022-07-13 SURGERY — CARDIOVERSION
Anesthesia: General

## 2022-07-21 ENCOUNTER — Ambulatory Visit (HOSPITAL_COMMUNITY): Payer: Medicare PPO | Admitting: Physician Assistant

## 2022-07-24 DIAGNOSIS — G4733 Obstructive sleep apnea (adult) (pediatric): Secondary | ICD-10-CM | POA: Diagnosis not present

## 2022-07-25 ENCOUNTER — Other Ambulatory Visit: Payer: Self-pay | Admitting: Sports Medicine

## 2022-07-25 DIAGNOSIS — M7662 Achilles tendinitis, left leg: Secondary | ICD-10-CM

## 2022-08-10 DIAGNOSIS — Z961 Presence of intraocular lens: Secondary | ICD-10-CM | POA: Diagnosis not present

## 2022-08-10 DIAGNOSIS — H524 Presbyopia: Secondary | ICD-10-CM | POA: Diagnosis not present

## 2022-08-10 DIAGNOSIS — H5213 Myopia, bilateral: Secondary | ICD-10-CM | POA: Diagnosis not present

## 2022-08-10 DIAGNOSIS — H52203 Unspecified astigmatism, bilateral: Secondary | ICD-10-CM | POA: Diagnosis not present

## 2022-08-20 ENCOUNTER — Ambulatory Visit (HOSPITAL_COMMUNITY)
Admission: RE | Admit: 2022-08-20 | Discharge: 2022-08-20 | Disposition: A | Discharge status: Home / Self Care | Payer: Medicare PPO | Source: Ambulatory Visit | Attending: Physician Assistant | Admitting: Physician Assistant

## 2022-08-20 ENCOUNTER — Encounter (HOSPITAL_COMMUNITY): Payer: Self-pay | Admitting: Physician Assistant

## 2022-08-20 VITALS — BP 132/82 | HR 71 | Ht 67.0 in | Wt 220.0 lb

## 2022-08-20 DIAGNOSIS — G4733 Obstructive sleep apnea (adult) (pediatric): Secondary | ICD-10-CM | POA: Diagnosis not present

## 2022-08-20 DIAGNOSIS — Z5181 Encounter for therapeutic drug level monitoring: Secondary | ICD-10-CM

## 2022-08-20 DIAGNOSIS — Z79899 Other long term (current) drug therapy: Secondary | ICD-10-CM

## 2022-08-20 DIAGNOSIS — I10 Essential (primary) hypertension: Secondary | ICD-10-CM | POA: Insufficient documentation

## 2022-08-20 DIAGNOSIS — D6869 Other thrombophilia: Secondary | ICD-10-CM | POA: Diagnosis not present

## 2022-08-20 DIAGNOSIS — Z7901 Long term (current) use of anticoagulants: Secondary | ICD-10-CM | POA: Diagnosis not present

## 2022-08-20 DIAGNOSIS — I4819 Other persistent atrial fibrillation: Secondary | ICD-10-CM | POA: Diagnosis not present

## 2022-08-20 NOTE — Progress Notes (Signed)
Primary Care Physician: [place], Glenda G, MD Referring Physician: [facility] f/u  Primary EP: Dr Francee Gentile is a 72 y.o. female with a h/o new onset afib that is in the afib clinic for f/u hosptial stay 6/13. She had neck surgery at that time. Afib was noted on pre op EKG and also at time of echo in April that was done for pre op clearance, although pt was not notified. While  in the hospital for neck surgery, afib was present and she was consulted by cardiology. She was started on BB for rate control and with CHA2DS2VASc score of 6, she was started on eliquis 72 hours after surgery. She was not aware of her afib.   In the office today, she remains in afib rate controlled. She reports that she has quit smoking. She drinks 2 alcoholic drinks a week. Husband reports that she has had snoring and apnea in the past but he does not note apnea now although she still has some snoring. She does not exercise.   F/u afib clinic, 6/30, remains in a fib at 86 bpm. She will be able to have cardioversion after 7/8 to allow for adequate anticoagulation. She has not missed any anticoagulation since start of drug, 6/17. She is tolerating well.   F/u in afib clinic 12/04/20. Unfortunately, cardioversion did not convert pt despite multiple shocks prior with manual compression. She is here to discuss options. She has developed a rash of her neck, arms torso that was there to a small degree prior to CV but has gotten worse since. Benadryl has not helped with the itching. Received propofol with CV and  in the past  has not shown to be allergic.   F/u in afib clinic, 01/16/21. She is in rate controlled afib today. She feels she may be going in and out. She  feels the BB is making her fatigued and teary at times. Discussed switching to cardizem daily. She brings in BP readings for the last several weeks. Overall BP looks well controlled. She would like to see what the monitor shows before she firmly makes her mind to  pursue antiarrythmic's.   F/u in afib clinic, 02/18/21. She remains in rate controlled afib. She is not symptomatic. Recent monitor did show 100% afib burden as pt thought she may be going in and out. She is pending another echo 10/20.    F/u in afib clinic, 03/25/21, after starting on flecainide with echo showing normal EF and with a low risk stress test. She is on 50 mg bid and will increase to 100 mg bid and I will see back in one week to discuss cardioversion. BP elevated here but BP at home controlled.   F/u in afib clinic 04/22/21. She had a successful cardioversion 11/29. Dr. Debara Pickett reduced PM metoprolol as she had bradycardia on return to SR. EKG today shows afib rate controlled. She does not feel she was in SR long enough to appreciate if she felt better. I discussed seeing EP to discuss ablation and she is in agreement. Last echo showed normal left atrial size.   F/u in afib clinic, 10/07/21, one month after ablation. She is in SR today with low amplitude p waves. She has noted some early beats, but no afib. She is also back to her baseline activities.  No swallowing or groin issues.   Follow up in the AF clinic 06/10/22. Patient reports that she feels well today. She is surprised to hear  she is in afib today, rate controlled. She did have COVID about two months ago. She denies any bleeding issues on anticoagulation.   Follow up in the AF clinic 07/06/22. Patient reports that since resuming flecainide she has felt more fatigued. She has noticed heart rates in the upper 40s at times. Fortunately, she is back in SR. No bleeding issues on anticoagulation.   Follow up in the AF clinic 08/20/22. Patient reports that she had an episode of afib and self increased her flecainide to 100 mg AM and 50 mg PM. Since then, she will have brief (1 minute or less) episodes of afib on her smart watch but nothing sustained. No bleeding issues on anticoagulation.   Today, she denies symptoms of chest pain, shortness of  breath, orthopnea, PND, lower extremity edema, dizziness, presyncope, syncope, or neurologic sequela. The patient is tolerating medications without difficulties and is otherwise without complaint today.   Past Medical History:  Diagnosis Date   Allergic rhinitis    Allergy    Anxiety    Arthritis    Cataract    Heart murmur    History of colonic polyps    History of PSVT (paroxysmal supraventricular tachycardia)    Hypertension    Impaired glucose tolerance    Obesity    PAT (paroxysmal atrial tachycardia)    Pre-diabetes    hx, not currently being monitor for diabetes, diet controlled   Stroke 08/2020   Scan revealed stroke in history   Tobacco user    Vertigo    started 3 weeks ago- after fluid trapped in ear   Past Surgical History:  Procedure Laterality Date   ankle sx-left fx     ANTERIOR CERVICAL DECOMP/DISCECTOMY FUSION N/A 10/28/2020   Procedure: Anterior Cervical Decompression Fusion - Cervical Four-Cervical Five - Cervical Five-Cervical Six - Cervical Six- Cervical Seven;  Surgeon: Kary Kos, MD;  Location: [place];  Service: Neurosurgery;  Laterality: N/A;  Anterior Cervical Decompression Fusion - Cervical Four-Cervical Five - Cervical Five-Cervical Six - Cervical Six- Cervical Seven   ATRIAL FIBRILLATION ABLATION N/A 09/05/2021   Procedure: ATRIAL FIBRILLATION ABLATION;  Surgeon: Constance Haw, MD;  Location: [facility];  Service: Cardiovascular;  Laterality: N/A;   CARDIOVERSION N/A 11/28/2020   Procedure: CARDIOVERSION;  Surgeon: Josue Hector, MD;  Location: [facility];  Service: Cardiovascular;  Laterality: N/A;   CARDIOVERSION N/A 04/15/2021   Procedure: CARDIOVERSION;  Surgeon: [name], MD;  Location: [place];  Service: Cardiovascular;  Laterality: N/A;   CATARACT EXTRACTION     both eyes   CHOLECYSTECTOMY N/A 01/11/2018   Procedure: LAPAROSCOPIC CHOLECYSTECTOMY WITH INTRAOPERATIVE CHOLANGIOGRAM ERAS PATHWAY;  Surgeon: Alphonsa Overall, MD;  Location: [place];  Service: General;  Laterality: N/A;   COLONOSCOPY  04/23/2005   DIAGNOSTIC LAPAROSCOPY     EYE SURGERY     foot sx     with the ankle surgery   POLYPECTOMY      Current Outpatient Medications  Medication Sig Dispense Refill   acetaminophen (TYLENOL) 500 MG tablet Take 1,000 mg by mouth 2 (two) times daily as needed for moderate pain.     ALPRAZolam (XANAX) 0.25 MG tablet Take 1 tablet (0.25 mg total) by mouth daily as needed. for anxiety 30 tablet 1   apixaban (ELIQUIS) 5 MG TABS tablet TAKE 1 TABLET BY MOUTH TWICE A DAY 180 tablet 3   diclofenac Sodium (VOLTAREN) 1 % GEL Apply 4 [name] topically 4 (four) times daily. [place]  [name] 0   diphenhydramine-acetaminophen (TYLENOL PM) 25-500 MG TABS tablet Take 1 tablet by mouth at bedtime as needed (sleep).     flecainide (TAMBOCOR) 100 MG tablet Take 0.5 tablets (50 mg total) by mouth 2 (two) times daily. 180 tablet 1   hydrochlorothiazide (HYDRODIURIL) 25 MG tablet Take 0.5 tablets (12.5 mg total) by mouth daily. 45 tablet 3   metoprolol succinate (TOPROL XL) 25 MG 24 hr tablet Take 1 tablet (25 mg total) by mouth at bedtime.     nitroGLYCERIN (NITRODUR - DOSED IN MG/24 HR) 0.2 mg/hr patch Cut patch into fourths. Place 1/4 patch over affected area on achilles. Change patch every 24 hours. 30 patch 2   PARoxetine (PAXIL) 40 MG tablet TAKE 1 TABLET BY MOUTH EVERY DAY 90 tablet 2   rosuvastatin (CRESTOR) 10 MG tablet TAKE 1 TABLET BY MOUTH EVERY DAY 90 tablet 1   No current facility-administered medications for this encounter.    Allergies  Allergen Reactions   Amoxicillin Rash and Other (See Comments)    Has patient had a PCN reaction causing immediate rash, facial/tongue/throat swelling, SOB or lightheadedness with hypotension: No HAS PT DEVELOPED SEVERE RASH INVOLVING MUCUS MEMBRANES or SKIN NECROSIS: #  #  YES  #  #  Has patient had a PCN reaction that required hospitalization: No Has patient had a PCN reaction occurring  within the last 10 years: No    Erythromycin Other (See Comments)    Upset stomach    Social History   Socioeconomic History   Marital status: Married    Spouse name: Not on file   Number of children: 2   Years of education: college   Highest education level: Not on file  Occupational History   Occupation: Retired  Tobacco Use   Smoking status: Former    Packs/day: 0.25    Years: 50.00    Additional pack years: 0.00    Total pack years: 12.50    Types: Cigarettes    Quit date: 10/17/2020    Years since quitting: 1.8   Smokeless tobacco: Never   Tobacco comments:    Former smoker 06/10/22  Vaping Use   Vaping Use: Never used  Substance and Sexual Activity   Alcohol use: Yes    Alcohol/week: 2.0 standard drinks of alcohol    Types: 2 Standard drinks or equivalent per week    Comment: weekends 06/10/22   Drug use: No   Sexual activity: Yes  Other Topics Concern   Not on file  Social History Narrative   Lives at home with her husband.   Right-handed.   One cup caffeine per day.   Social Determinants of Health   Financial Resource Strain: Low Risk  (02/19/2022)   Overall Financial Resource Strain (CARDIA)    Difficulty of Paying Living Expenses: Not hard at all  Food Insecurity: No Food Insecurity (03/13/2022)   Hunger Vital Sign    Worried About Running Out of Food in the Last Year: Never true    Ran Out of Food in the Last Year: Never true  Transportation Needs: No Transportation Needs (03/13/2022)   PRAPARE - Hydrologist (Medical): No    Lack of Transportation (Non-Medical): No  Physical Activity: Inactive (02/19/2022)   Exercise Vital Sign    Days of Exercise per Week: 0 days    Minutes of Exercise per Session: 0 min  Stress: No Stress Concern Present (02/19/2022)   [employer] -  Occupational Stress Questionnaire    Feeling of Stress : Not at all  Social Connections: Moderately Isolated (02/19/2022)    Social Connection and Isolation Panel [NHANES]    Frequency of Communication with Friends and Family: More than three times a week    Frequency of Social Gatherings with Friends and Family: More than three times a week    Attends Religious Services: Never    Marine scientist or Organizations: No    Attends Archivist Meetings: Never    Marital Status: Married  Human resources officer Violence: Not At Risk (02/19/2022)   Humiliation, Afraid, Rape, and Kick questionnaire    Fear of Current or Ex-Partner: No    Emotionally Abused: No    Physically Abused: No    Sexually Abused: No    Family History  Problem Relation Age of Onset   Healthy Mother    Dementia Father    Healthy Sister    Healthy Sister    Alzheimer's disease Other    Colon cancer Neg Hx    Esophageal cancer Neg Hx    Rectal cancer Neg Hx    Stomach cancer Neg Hx    Colon polyps Neg Hx     ROS- All systems are reviewed and negative except as per the HPI above  Physical Exam: Vitals:   08/20/22 1036  BP: 132/82  Pulse: 71  Weight: 99.8 kg  Height: 5\' 7"  (1.702 m)    Wt Readings from Last 3 Encounters:  08/20/22 99.8 kg  06/10/22 98.2 kg  03/10/22 98 kg    Labs: Lab Results  Component Value Date   NA 140 03/10/2022   K 4.2 03/10/2022   CL 99 03/10/2022   CO2 32 03/10/2022   GLUCOSE 99 03/30/2022   BUN 17 03/10/2022   CREATININE 0.71 03/10/2022   CALCIUM 9.8 03/10/2022   MG 1.9 10/28/2020   No results found for: "INR" Lab Results  Component Value Date   CHOL 107 03/10/2022   HDL 43.90 03/10/2022   LDLCALC 47 03/10/2022   TRIG 82.0 03/10/2022    GEN- The patient is a well appearing female, alert and oriented x 3 today.   HEENT-head normocephalic, atraumatic, sclera clear, conjunctiva pink, hearing intact, trachea midline. Lungs- Clear to ausculation bilaterally, normal work of breathing Heart- Regular rate and rhythm, no murmurs, rubs or gallops  GI- soft, NT, ND, +  BS Extremities- no clubbing, cyanosis, or edema MS- no significant deformity or atrophy Skin- no rash or lesion Psych- euthymic mood, full affect Neuro- strength and sensation are intact   EKG today demonstrates SR, 1st degree AV block Vent. rate 71 BPM PR interval 236 ms QRS duration 108 ms QT/QTcB 420/456 ms   Echo- 03/06/21 1. Left ventricular ejection fraction, by estimation, is 60 to 65%. The left ventricle has normal function. The left ventricle has no regional wall motion abnormalities. Left ventricular diastolic parameters are indeterminate.   2. Right ventricular systolic function is normal. The right ventricular  size is normal. There is normal pulmonary artery systolic pressure.   3. The mitral valve is normal in structure. No evidence of mitral valve regurgitation. No evidence of mitral stenosis.   4. The aortic valve was not well visualized. Aortic valve regurgitation  is not visualized.   5. The inferior vena cava is normal in size with greater than 50%  respiratory variability, suggesting right atrial pressure of 3 mmHg.    CHA2DS2-VASc Score = 6  The patient's score  is based upon: CHF History: 0 HTN History: 1 Diabetes History: 0 Stroke History: 2 Vascular Disease History: 1 (aortic atherosclerosis) Age Score: 1 Gender Score: 1       ASSESSMENT AND PLAN: 1. Persistent Atrial Fibrillation (ICD10:  I48.19) The patient's CHA2DS2-VASc score is 6, indicating a 9.7% annual risk of stroke.   S/p afib ablation 09/05/21 Patient in [place] today, having very brief episodes.  Continue flecainide 100 mg AP and 50 mg PM. We discussed alternate rhythm control options, she is happy with her present therapy for now.  Continue Toprol 25 mg daily Continue Eliquis 5 mg BID Apple Watch for home monitoring.   2. Secondary Hypercoagulable State (ICD10:  D68.69) The patient is at significant risk for stroke/thromboembolism based upon her CHA2DS2-VASc Score of 6.  Continue  Apixaban (Eliquis).   3. OSA Followed by Dr Claiborne Billings Encouraged compliance with CPAP therapy.  4. HTN Stable, no changes today.   Follow up in the AF clinic in 3 months.    [facility] [address] [place], [place] [address] [phone]

## 2022-08-21 ENCOUNTER — Other Ambulatory Visit: Payer: Self-pay | Admitting: Family Medicine

## 2022-08-24 DIAGNOSIS — G4733 Obstructive sleep apnea (adult) (pediatric): Secondary | ICD-10-CM | POA: Diagnosis not present

## 2022-09-03 IMAGING — MG DIGITAL SCREENING BILAT W/ TOMO W/ CAD
8 series · 8 of 24 positions shown · non-contrast
Comparison: None.

CLINICAL DATA: Screening.

EXAM:
DIGITAL SCREENING BILATERAL MAMMOGRAM WITH TOMO AND CAD

[R MLO synth-2D]
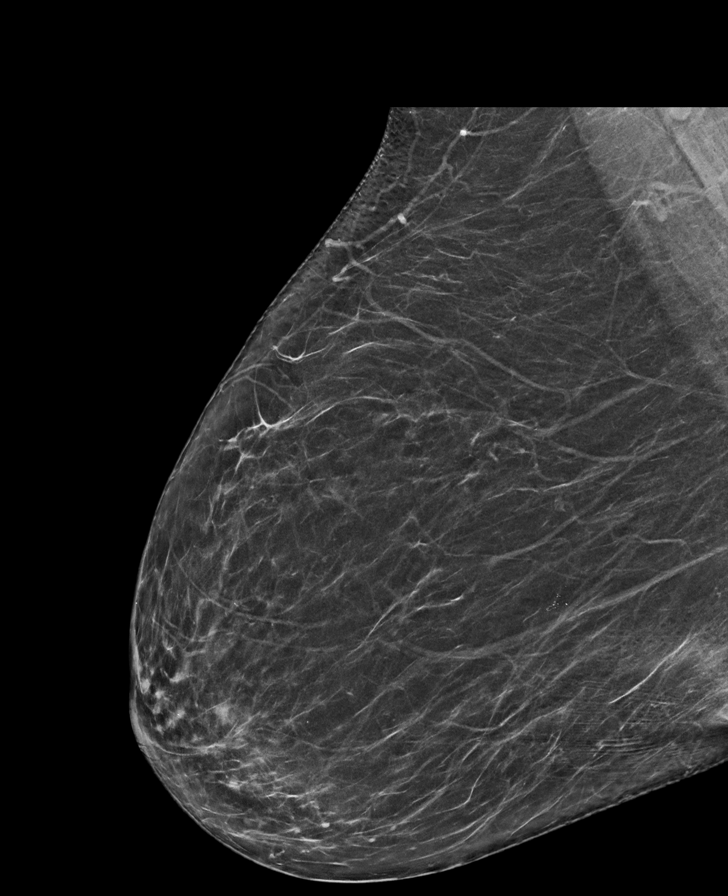

[R CC synth-2D]
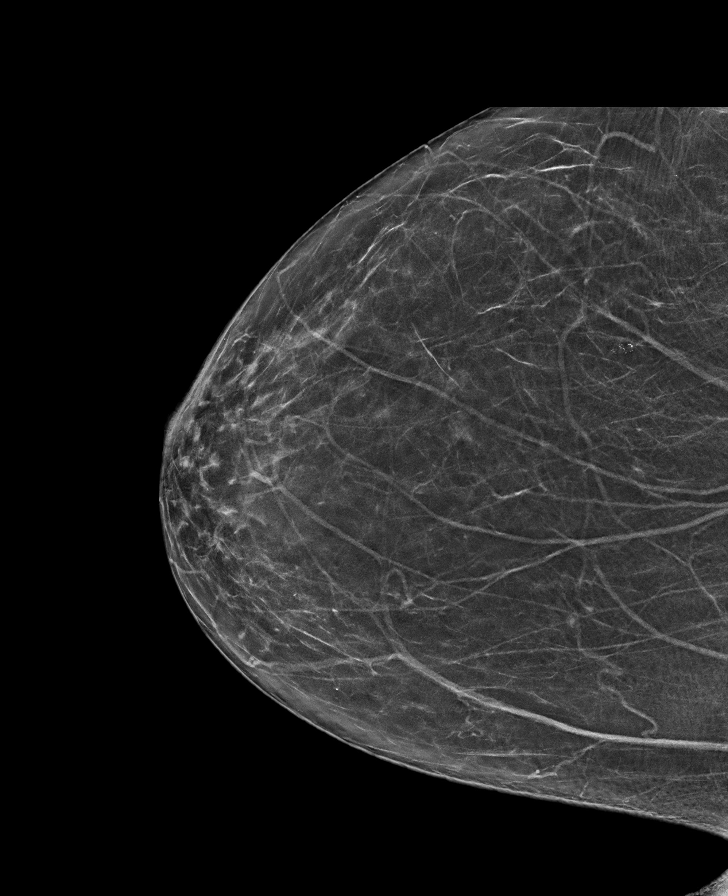

[L CC synth-2D]
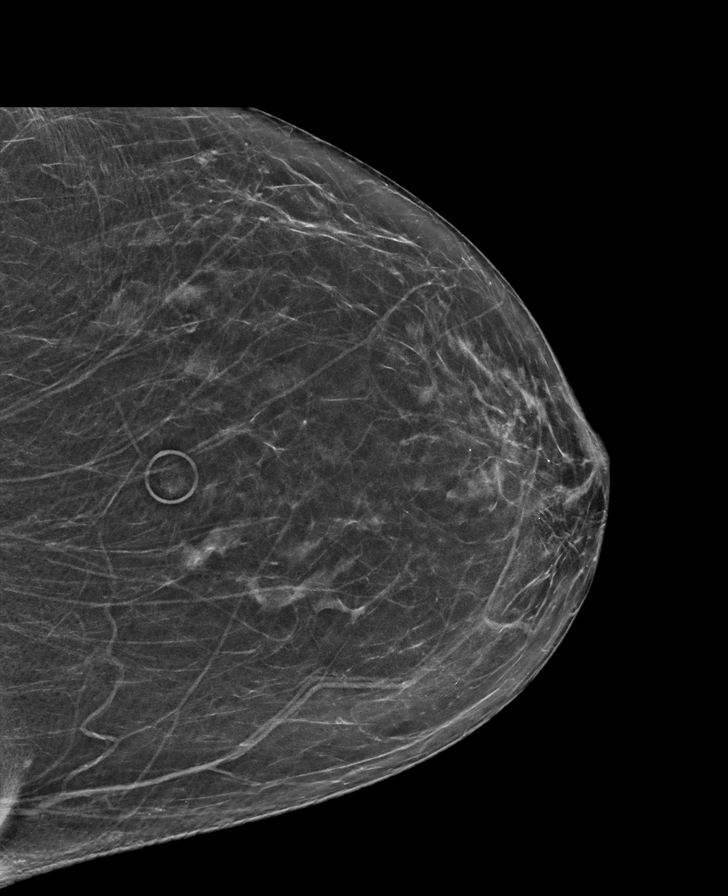

[L MLO synth-2D]
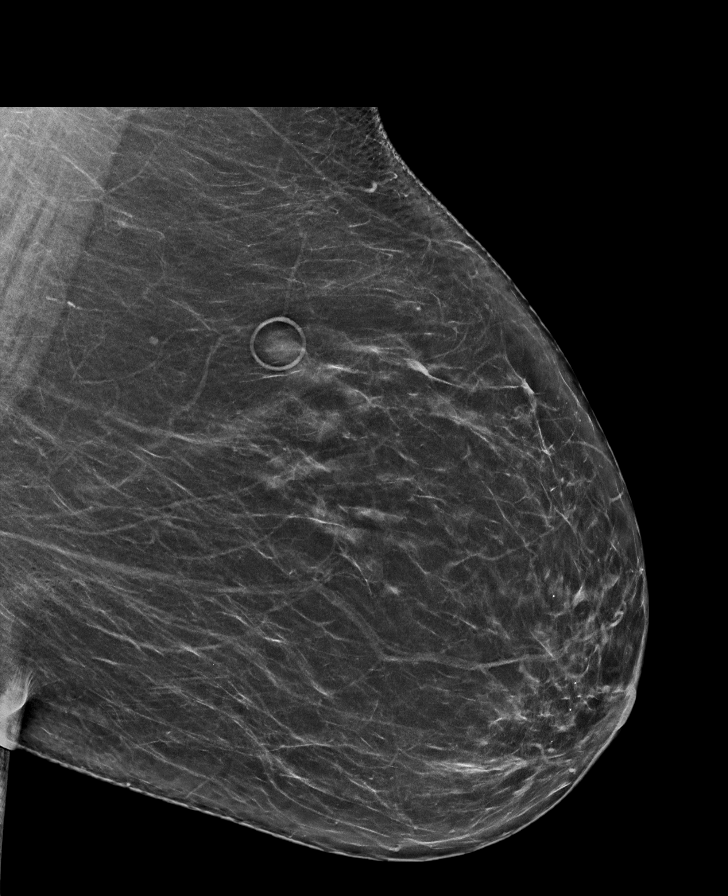

[R CC tomo · tomo slice 35/68.0]
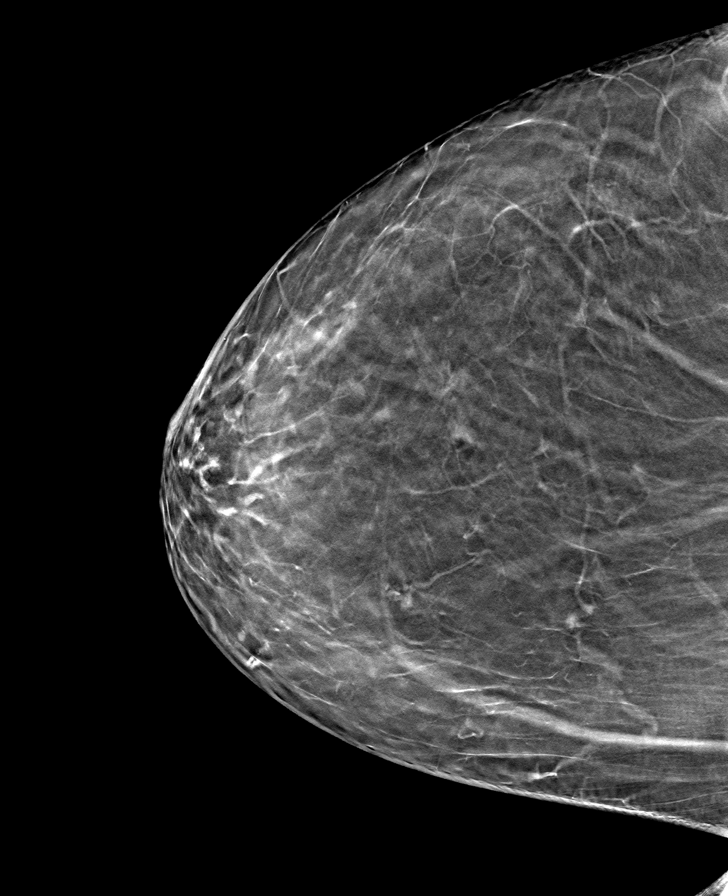

[L CC tomo · tomo slice 34/67.0]
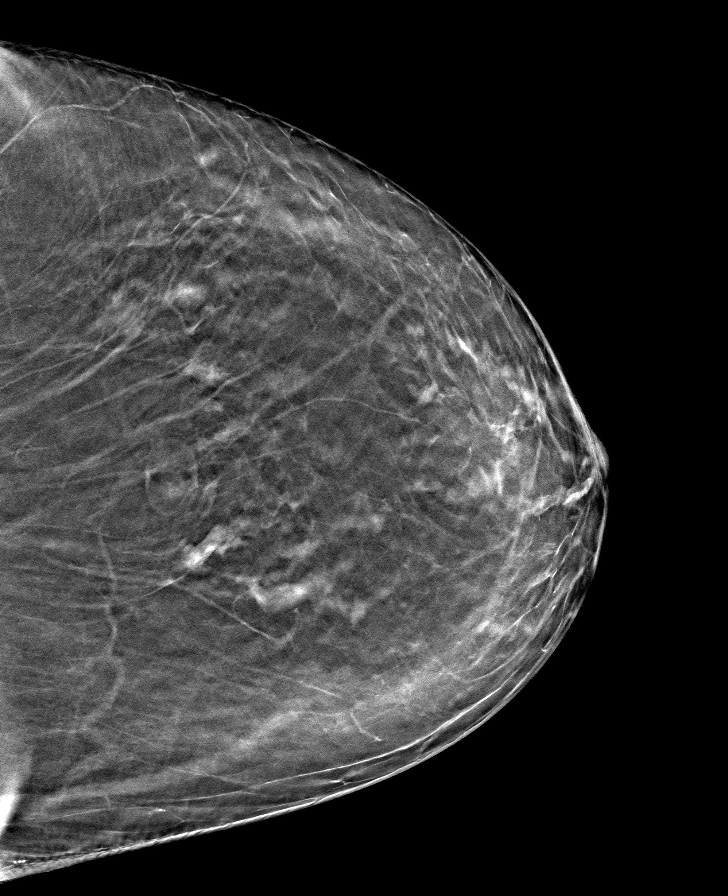

[R MLO tomo · tomo slice 37/74.0]
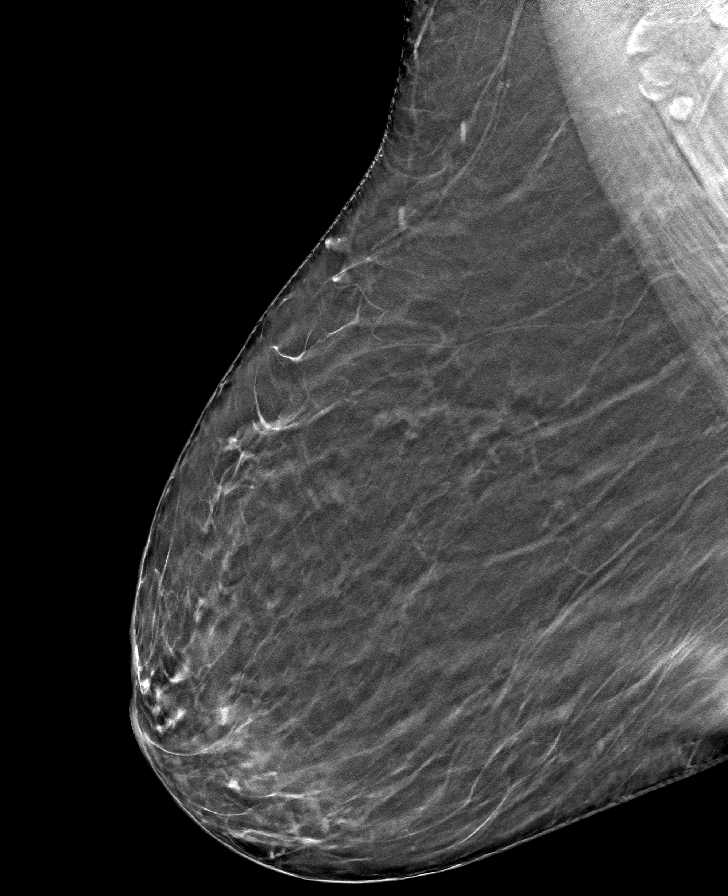

[L MLO tomo · tomo slice 39/76.0]
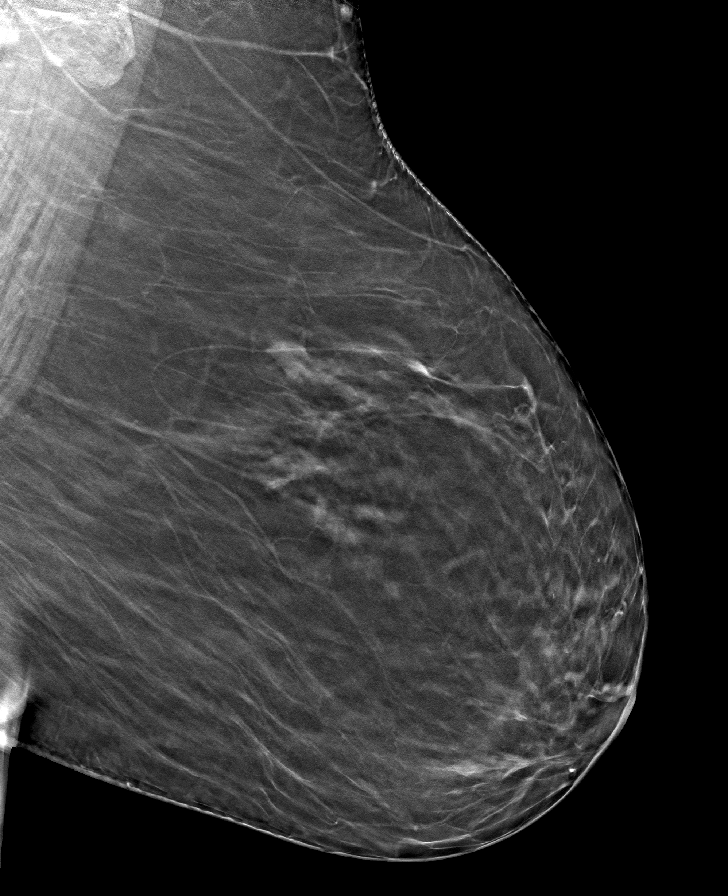

[8 of 24 positions shown; findings below may reference images not displayed]

ACR Breast Density Category b: There are scattered areas of
fibroglandular density.
FINDINGS: In the right breast, calcifications warrant further evaluation with
magnified views. In the left breast, no findings suspicious for
malignancy. Images were processed with CAD.
IMPRESSION: Further evaluation is suggested for calcifications in the right
breast.

RECOMMENDATION:
Diagnostic mammogram of the right breast. (Code:[id number])

The patient will be contacted regarding the findings, and additional
imaging will be scheduled.

BI-RADS CATEGORY  0: Incomplete. Need additional imaging evaluation
and/or prior mammograms for comparison.

## 2022-09-23 DIAGNOSIS — G4733 Obstructive sleep apnea (adult) (pediatric): Secondary | ICD-10-CM | POA: Diagnosis not present

## 2022-10-23 ENCOUNTER — Other Ambulatory Visit: Payer: Self-pay | Admitting: Family Medicine

## 2022-11-18 ENCOUNTER — Encounter (HOSPITAL_COMMUNITY): Payer: Self-pay | Admitting: Physician Assistant

## 2022-11-18 ENCOUNTER — Ambulatory Visit (HOSPITAL_COMMUNITY)
Admission: RE | Admit: 2022-11-18 | Discharge: 2022-11-18 | Disposition: A | Discharge status: Home / Self Care | Payer: Medicare PPO | Source: Ambulatory Visit | Attending: Physician Assistant | Admitting: Physician Assistant

## 2022-11-18 VITALS — BP 114/72 | HR 62 | Ht 67.0 in | Wt 219.6 lb

## 2022-11-18 DIAGNOSIS — Z7901 Long term (current) use of anticoagulants: Secondary | ICD-10-CM | POA: Insufficient documentation

## 2022-11-18 DIAGNOSIS — Z79899 Other long term (current) drug therapy: Secondary | ICD-10-CM | POA: Diagnosis not present

## 2022-11-18 DIAGNOSIS — D6869 Other thrombophilia: Secondary | ICD-10-CM | POA: Diagnosis not present

## 2022-11-18 DIAGNOSIS — I10 Essential (primary) hypertension: Secondary | ICD-10-CM | POA: Diagnosis not present

## 2022-11-18 DIAGNOSIS — Z8673 Personal history of transient ischemic attack (TIA), and cerebral infarction without residual deficits: Secondary | ICD-10-CM | POA: Diagnosis not present

## 2022-11-18 DIAGNOSIS — G4733 Obstructive sleep apnea (adult) (pediatric): Secondary | ICD-10-CM | POA: Insufficient documentation

## 2022-11-18 DIAGNOSIS — Z5181 Encounter for therapeutic drug level monitoring: Secondary | ICD-10-CM | POA: Diagnosis not present

## 2022-11-18 DIAGNOSIS — I4819 Other persistent atrial fibrillation: Secondary | ICD-10-CM | POA: Diagnosis not present

## 2022-11-18 NOTE — Progress Notes (Signed)
Primary Care Physician: [place], Betty G, MD Primary Cardiologist: Jodelle Red, MD Electrophysiologist: Will Jorja Loa, MD  Referring Physician: [facility]   Glenda [name] is a 72 y.o. female with a history of CVA, HTN, OSA, aortic atherosclerosis, atrial fibrillation who presents for follow up in the [facility]. S/p afib ablation 09/05/21, maintained on flecainide. Patient is on Eliquis for a CHADS2VASC score of 6.  On follow up today, patient reports that she has done well since her last visit. She will rarely see afib on her smart watch and when she checks it 2 minutes later she is back in SR. No bleeding issues on anticoagulation.   Today, she denies symptoms of palpitations, chest pain, shortness of breath, orthopnea, PND, lower extremity edema, dizziness, presyncope, syncope, bleeding, or neurologic sequela. The patient is tolerating medications without difficulties and is otherwise without complaint today.    Atrial Fibrillation Risk Factors:  she does have symptoms or diagnosis of sleep apnea. she is compliant with CPAP therapy. she does not have a history of rheumatic fever.   Atrial Fibrillation Management history:  Previous antiarrhythmic drugs: flecainide  Previous cardioversions: 11/28/20, 04/15/21 Previous ablations: 09/05/21 Anticoagulation history: Eliquis  ROS- All systems are reviewed and negative except as per the HPI above.   Physical Exam: BP 114/72   Pulse 62   Ht 5\' 7"  (1.702 m)   Wt 99.6 kg   BMI 34.39 kg/m   GEN: Well nourished, well developed in no acute distress NECK: No JVD; No carotid bruits CARDIAC: Regular rate and rhythm, no murmurs, rubs, gallops RESPIRATORY:  Clear to auscultation without rales, wheezing or rhonchi  ABDOMEN: Soft, non-tender, non-distended EXTREMITIES:  No edema; No deformity   Wt Readings from Last 3 Encounters:  11/18/22 99.6 kg  08/20/22 99.8 kg  06/10/22 98.2 kg      EKG today demonstrates  SR, 1st degree AV block Vent. rate 62 BPM PR interval 224 ms QRS duration 100 ms QT/QTcB 410/416 ms  Echo 03/06/21 demonstrated   1. Left ventricular ejection fraction, by estimation, is 60 to 65%. The  left ventricle has normal function. The left ventricle has no regional  wall motion abnormalities. Left ventricular diastolic parameters are  indeterminate.   2. Right ventricular systolic function is normal. The right ventricular  size is normal. There is normal pulmonary artery systolic pressure.   3. The mitral valve is normal in structure. No evidence of mitral valve  regurgitation. No evidence of mitral stenosis.   4. The aortic valve was not well visualized. Aortic valve regurgitation  is not visualized.   5. The inferior vena cava is normal in size with greater than 50%  respiratory variability, suggesting right atrial pressure of 3 mmHg.   Comparison(s): A prior study was performed on 09/13/20. No significant  change from prior study.    CHA2DS2-VASc Score = 6  The patient's score is based upon: CHF History: 0 HTN History: 1 Diabetes History: 0 Stroke History: 2 Vascular Disease History: 1 (aortic atherosclerosis) Age Score: 1 Gender Score: 1       ASSESSMENT AND PLAN: Persistent Atrial Fibrillation (ICD10:  I48.19) The patient's CHA2DS2-VASc score is 6, indicating a 9.7% annual risk of stroke.   S/p afib ablation 09/05/21 Patient appears to be maintaining SR with rare very brief episodes. Continue flecainide 100 mg AM and 50 mg PM Continue Toprol 25 mg daily Continue Eliquis 5 mg BID Apple Watch for home monitoring.  Secondary Hypercoagulable State (ICD10:  D68.69) The patient is at significant risk for stroke/thromboembolism based upon her CHA2DS2-VASc Score of 6.  Continue Apixaban (Eliquis).   OSA  Encouraged nightly CPAP Followed by Dr Tresa Endo  HTN Stable on current regimen   Follow up in the AF clinic in 6 months.     Jorja Loa PA-C Afib [facility] [address] [place], [place] [address] [phone]

## 2022-11-22 ENCOUNTER — Other Ambulatory Visit (HOSPITAL_COMMUNITY): Payer: Self-pay | Admitting: Physician Assistant

## 2022-11-22 ENCOUNTER — Other Ambulatory Visit: Payer: Self-pay | Admitting: Cardiovascular Disease

## 2022-11-22 ENCOUNTER — Encounter: Payer: Self-pay | Admitting: Family Medicine

## 2022-11-23 ENCOUNTER — Other Ambulatory Visit (HOSPITAL_COMMUNITY): Payer: Self-pay | Admitting: *Deleted

## 2022-11-23 MED ORDER — METOPROLOL SUCCINATE ER 25 MG PO TB24: 25.0000 mg | ORAL_TABLET | Freq: Every day | ORAL | 2 refills | Status: DC

## 2023-01-29 ENCOUNTER — Ambulatory Visit (INDEPENDENT_AMBULATORY_CARE_PROVIDER_SITE_OTHER): Payer: Medicare PPO

## 2023-01-29 DIAGNOSIS — Z23 Encounter for immunization: Secondary | ICD-10-CM

## 2023-02-23 ENCOUNTER — Encounter: Payer: Self-pay | Admitting: Family Medicine

## 2023-02-23 ENCOUNTER — Ambulatory Visit (INDEPENDENT_AMBULATORY_CARE_PROVIDER_SITE_OTHER): Payer: Medicare PPO | Admitting: Family Medicine

## 2023-02-23 VITALS — BP 138/66 | HR 60 | Wt 217.0 lb

## 2023-02-23 DIAGNOSIS — Z0000 Encounter for general adult medical examination without abnormal findings: Secondary | ICD-10-CM | POA: Diagnosis not present

## 2023-02-23 NOTE — Progress Notes (Signed)
PATIENT CHECK-IN and HEALTH RISK ASSESSMENT QUESTIONNAIRE:  -completed by phone/video for upcoming Medicare Preventive Visit  Pre-Visit Check-in: 1)Vitals (height, wt, BP, etc) - record in vitals section for visit on day of visit Request home vitals (wt, BP, etc.) and enter into vitals, THEN update Vital Signs SmartPhrase below at the top of the HPI. See below.  2)Review and Update Medications, Allergies PMH, Surgeries, Social history in Epic 3)Hospitalizations in the last year with date/reason? no  4)Review and Update Care Team (patient's specialists) in Epic 5) Complete PHQ9 in Epic  6) Complete Fall Screening in Epic 7)Review all Health Maintenance Due and order under PCP if not done.  8)Medicare Wellness Questionnaire: Answer theses question about your habits: Do you drink alcohol? Yes, occasional If yes, how many drinks do you have a day?1-2  Have you ever smoked? Yes Quit date if applicable? 2022  How many packs a day do/did you smoke? 0.3 per pack Do you use smokeless tobacco? No Do you use an illicit drugs? No Do you exercises? No IF so, what type and how many days/minutes per week? N/A Are you sexually active? Yes Number of partners?1 Typical breakfast: coffee and protein bar Typical lunch: sandwich Typical dinner: meat and vegetables Typical snacks:cheese and crackers  Beverages: water and ginger ale  Answer theses question about you: Can you perform most household chores? Yes Do you find it hard to follow a conversation in a noisy room? Sometimes Do you often ask people to speak up or repeat themselves? Occasional Do you feel that you have a problem with memory? No Do you balance your checkbook and or bank acounts? Yes Do you feel safe at home? Yes Last dentist visit? Over 10 years ago Do you need assistance with any of the following: Please note if so:No  Driving? No  Feeding yourself? No  Getting from bed to chair? No  Getting to the toilet? No  Bathing or  showering? No  Dressing yourself? No  Managing money? No  Climbing a flight of stairs? No  Preparing meals? No  Do you have Advanced Directives in place (Living Will, Healthcare Power or Attorney)? Yes   Last eye Exam and location? 4-5 months ago, Dr.Shapiro   Do you currently use prescribed or non-prescribed narcotic or opioid pain medications? no  Do you have a history or close family history of breast, ovarian, tubal or peritoneal cancer or a family member with BRCA (breast cancer susceptibility 1 and 2) gene mutations? no  Yes: Request home vitals (wt, BP, etc.) and enter into vitals, THEN update Vital Signs SmartPhrase below at the top of the HPI. See below.   Nurse/Assistant Credentials/time stamp: BDS 02/23/23 12:15pm   ----------------------------------------------------------------------------------------------------------------------------------------------------------------------------------------------------------------------  Because this visit was a virtual/telehealth visit, some criteria may be missing or patient reported. Any vitals not documented were not able to be obtained and vitals that have been documented are patient reported.    MEDICARE ANNUAL PREVENTIVE VISIT WITH PROVIDER: (Welcome to [facility], initial annual wellness or annual wellness exam)  Virtual Visit via Video Note  I connected with Glenda [name] on 02/23/23 by a video enabled telemedicine application and verified that I am speaking with the correct person using two identifiers.  Location patient: home Location provider:work or home office Persons participating in the virtual visit: patient, provider  Concerns and/or follow up today: reports pretty stable. A fib better since ablation she reports. No concerns today.    See HM section in Epic for other details of  completed HM.    ROS: negative for report of fevers, unintentional weight loss, vision changes, vision loss, hearing loss or change,  chest pain, sob, hemoptysis, melena, hematochezia, hematuria, falls, bleeding or bruising, thoughts of suicide or self harm, memory loss  Patient-completed extensive health risk assessment - reviewed and discussed with the patient: See Health Risk Assessment completed with patient prior to the visit either above or in recent phone note. This was reviewed in detailed with the patient today and appropriate recommendations, orders and referrals were placed as needed per Summary below and patient instructions.   Review of Medical History: -PMH, PSH, Family History and current specialty and care providers reviewed and updated and listed below   Patient Care Team: [place], Betty G, MD as PCP - General (Family Medicine) Jodelle Red, MD as PCP - Cardiology (Cardiology) Regan Lemming, MD as PCP - Electrophysiology (Cardiology) Lennette Bihari, MD as PCP - Sleep Medicine (Cardiology)   Past Medical History:  Diagnosis Date   Allergic rhinitis    Allergy    Anxiety    Arthritis    Cataract    Heart murmur    History of colonic polyps    History of PSVT (paroxysmal supraventricular tachycardia)    Hypertension    Impaired glucose tolerance    Obesity    PAT (paroxysmal atrial tachycardia) (HCC)    Pre-diabetes    hx, not currently being monitor for diabetes, diet controlled   Stroke (HCC) 08/2020   Scan revealed stroke in history   Tobacco user    Vertigo    started 3 weeks ago- after fluid trapped in ear    Past Surgical History:  Procedure Laterality Date   ankle sx-left fx     ANTERIOR CERVICAL DECOMP/DISCECTOMY FUSION N/A 10/28/2020   Procedure: Anterior Cervical Decompression Fusion - Cervical Four-Cervical Five - Cervical Five-Cervical Six - Cervical Six- Cervical Seven;  Surgeon: Donalee Citrin, MD;  Location: [facility] OR;  Service: Neurosurgery;  Laterality: N/A;  Anterior Cervical Decompression Fusion - Cervical Four-Cervical Five - Cervical Five-Cervical Six - Cervical  Six- Cervical Seven   ATRIAL FIBRILLATION ABLATION N/A 09/05/2021   Procedure: ATRIAL FIBRILLATION ABLATION;  Surgeon: Regan Lemming, MD;  Location: [facility] INVASIVE CV LAB;  Service: Cardiovascular;  Laterality: N/A;   CARDIOVERSION N/A 11/28/2020   Procedure: CARDIOVERSION;  Surgeon: Wendall Stade, MD;  Location: [facility] ENDOSCOPY;  Service: Cardiovascular;  Laterality: N/A;   CARDIOVERSION N/A 04/15/2021   Procedure: CARDIOVERSION;  Surgeon: Chrystie Nose, MD;  Location: [facility] ENDOSCOPY;  Service: Cardiovascular;  Laterality: N/A;   CATARACT EXTRACTION     both eyes   CHOLECYSTECTOMY N/A 01/11/2018   Procedure: LAPAROSCOPIC CHOLECYSTECTOMY WITH INTRAOPERATIVE CHOLANGIOGRAM ERAS PATHWAY;  Surgeon: Ovidio Kin, MD;  Location: [facility] OR;  Service: General;  Laterality: N/A;   COLONOSCOPY  04/23/2005   DIAGNOSTIC LAPAROSCOPY     EYE SURGERY     foot sx     with the ankle surgery   POLYPECTOMY      Social History   Socioeconomic History   Marital status: Married    Spouse name: Not on file   Number of children: 2   Years of education: college   Highest education level: Not on file  Occupational History   Occupation: Retired  Tobacco Use   Smoking status: Former    Current packs/day: 0.00    Average packs/day: 0.3 packs/day for 50.0 years (12.5 ttl pk-yrs)    Types: Cigarettes    Start  date: 10/18/1970    Quit date: 10/17/2020    Years since quitting: 2.3   Smokeless tobacco: Never   Tobacco comments:    Former smoker 06/10/22  Vaping Use   Vaping status: Never Used  Substance and Sexual Activity   Alcohol use: Yes    Alcohol/week: 2.0 standard drinks of alcohol    Types: 2 Standard drinks or equivalent per week    Comment: weekends 06/10/22   Drug use: No   Sexual activity: Yes  Other Topics Concern   Not on file  Social History Narrative   Lives at home with her husband.   Right-handed.   One cup caffeine per day.   Social Determinants of Health   Financial Resource  Strain: Low Risk  (02/19/2022)   Overall Financial Resource Strain (CARDIA)    Difficulty of Paying Living Expenses: Not hard at all  Food Insecurity: No Food Insecurity (03/13/2022)   Hunger Vital Sign    Worried About Running Out of Food in the Last Year: Never true    Ran Out of Food in the Last Year: Never true  Transportation Needs: No Transportation Needs (03/13/2022)   PRAPARE - Administrator, Civil Service (Medical): No    Lack of Transportation (Non-Medical): No  Physical Activity: Inactive (02/19/2022)   Exercise Vital Sign    Days of Exercise per Week: 0 days    Minutes of Exercise per Session: 0 min  Stress: No Stress Concern Present (02/19/2022)   [employer] of Occupational Health - Occupational Stress Questionnaire    Feeling of Stress : Not at all  Social Connections: Moderately Isolated (02/19/2022)   Social Connection and Isolation Panel [NHANES]    Frequency of Communication with Friends and Family: More than three times a week    Frequency of Social Gatherings with Friends and Family: More than three times a week    Attends Religious Services: Never    Database administrator or Organizations: No    Attends Banker Meetings: Never    Marital Status: Married  Catering manager Violence: Not At Risk (02/19/2022)   Humiliation, Afraid, Rape, and Kick questionnaire    Fear of Current or Ex-Partner: No    Emotionally Abused: No    Physically Abused: No    Sexually Abused: No    Family History  Problem Relation Age of Onset   Healthy Mother    Dementia Father    Healthy Sister    Healthy Sister    Alzheimer's disease Other    Colon cancer Neg Hx    Esophageal cancer Neg Hx    Rectal cancer Neg Hx    Stomach cancer Neg Hx    Colon polyps Neg Hx     Current Outpatient Medications on File Prior to Visit  Medication Sig Dispense Refill   acetaminophen (TYLENOL) 500 MG tablet Take 1,000 mg by mouth 2 (two) times daily as needed for  moderate pain.     ALPRAZolam (XANAX) 0.25 MG tablet Take 1 tablet (0.25 mg total) by mouth daily as needed. for anxiety 30 tablet 1   apixaban (ELIQUIS) 5 MG TABS tablet TAKE 1 TABLET BY MOUTH TWICE A DAY 180 tablet 3   diclofenac Sodium (VOLTAREN) 1 % GEL Apply 4 g topically 4 (four) times daily. (Patient taking differently: Apply 4 g topically as needed.) 30 g 0   diphenhydramine-acetaminophen (TYLENOL PM) 25-500 MG TABS tablet Take 1 tablet by mouth at bedtime as needed (sleep).  flecainide (TAMBOCOR) 100 MG tablet TAKE 1 TABLET BY MOUTH TWICE A DAY 180 tablet 2   hydrochlorothiazide (HYDRODIURIL) 25 MG tablet TAKE 1/2 TABLET BY MOUTH EVERY DAY 45 tablet 3   metoprolol succinate (TOPROL XL) 25 MG 24 hr tablet Take 1 tablet (25 mg total) by mouth at bedtime. 90 tablet 2   PARoxetine (PAXIL) 40 MG tablet TAKE 1 TABLET BY MOUTH EVERY DAY 90 tablet 2   rosuvastatin (CRESTOR) 10 MG tablet TAKE 1 TABLET BY MOUTH EVERY DAY 90 tablet 2   [DISCONTINUED] metoprolol tartrate (LOPRESSOR) 25 MG tablet Take 1 tablet (25 mg total) by mouth 3 (three) times daily. 90 tablet 0   No current facility-administered medications on file prior to visit.    Allergies  Allergen Reactions   Amoxicillin Rash and Other (See Comments)    Has patient had a PCN reaction causing immediate rash, facial/tongue/throat swelling, SOB or lightheadedness with hypotension: No HAS PT DEVELOPED SEVERE RASH INVOLVING MUCUS MEMBRANES or SKIN NECROSIS: #  #  YES  #  #  Has patient had a PCN reaction that required hospitalization: No Has patient had a PCN reaction occurring within the last 10 years: No    Erythromycin Other (See Comments)    Upset stomach       Physical Exam Vitals requested from patient and listed below if patient had equipment and was able to obtain at home for this virtual visit: Vitals:   02/23/23 1215  BP: 138/66  Pulse: 60  Reports BP usually lower, thinks was stressed with getting on the visit -  rechecked an 1 teens/50s  Estimated body mass index is 33.99 kg/m as calculated from the following:   Height as of 11/18/22: 5\' 7"  (1.702 m).   Weight as of this encounter: 217 lb (98.4 kg).  EKG (optional): deferred due to virtual visit  GENERAL: alert, oriented, no acute distress detected, full vision exam deferred due to pandemic and/or virtual encounter  HEENT: atraumatic, conjunttiva clear, no obvious abnormalities on inspection of external nose and ears  NECK: normal movements of the head and neck  LUNGS: on inspection no signs of respiratory distress, breathing rate appears normal, no obvious gross SOB, gasping or wheezing  CV: no obvious cyanosis  MS: moves all visible extremities without noticeable abnormality  PSYCH/NEURO: pleasant and cooperative, no obvious depression or anxiety, speech and thought processing grossly intact, Cognitive function grossly intact        02/23/2023   12:07 PM 03/13/2022    2:40 PM 03/10/2022    9:04 AM 02/19/2022   10:54 AM 02/18/2021    9:08 AM  Depression screen PHQ 2/9  Decreased Interest 0 0 0 0 0  Down, Depressed, Hopeless 0 0 0 0 0  PHQ - 2 Score 0 0 0 0 0       02/13/2022   11:17 AM 02/19/2022   10:57 AM 03/10/2022    9:04 AM 02/21/2023   10:33 AM 02/23/2023   12:07 PM  Fall Risk  Falls in the past year? 1 1 0 1 1  Was there an injury with Fall? 0 0 0 0 0  Was there an injury with Fall? - Comments  No injury or need for medical attention     Fall Risk Category Calculator 2 1 0 1 1  Fall Risk Category (Retired) Moderate Low Low    (RETIRED) Patient Fall Risk Level  Low fall risk Low fall risk    Patient at Risk for  Falls Due to  No Fall Risks Other (Comment)  No Fall Risks  Fall risk Follow up   Falls evaluation completed  Falls evaluation completed   One fall at a ball game, tripped over something her grandson dropped. Balance is a little bit of an issues for her. Reports is doing balance exercises daily and balance is  improving.   SUMMARY AND PLAN:  Encounter for Medicare annual wellness exam   Discussed applicable health maintenance/preventive health measures and advised and referred or ordered per patient preferences: -she plans to check on colonoscopy - was scheduled, but now no on her schedule, she plans to call her GI office -discussed vaccines due and recs per CDC -she declined repeat bone density for now, agrees to call if changes her mind  Health Maintenance  Topic Date Due   Zoster Vaccines- Shingrix (1 of 2) Never done   DTaP/Tdap/Td (2 - Td or Tdap) 03/12/2021   Colonoscopy  03/01/2023   COVID-19 Vaccine (5 - 2023-24 season) 03/11/2023 (Originally 01/17/2023)   Medicare Annual Wellness (AWV)  02/23/2024   MAMMOGRAM  05/22/2024   Pneumonia Vaccine 7+ Years old  Completed   INFLUENZA VACCINE  Completed   DEXA SCAN  Completed   Hepatitis C Screening  Completed   HPV VACCINES  Aged [employer] and counseling on the following was provided based on the above review of health and a plan/checklist for the patient, along with additional information discussed, was provided for the patient in the patient instructions :  -Provided safe balance exercises that can be done at home to improve balance and discussed exercise guidelines for adults with include balance exercises at least 3 days per week.  -Advised and counseled on a healthy lifestyle - including the importance of a healthy diet, regular physical activity, social connections and stress management. -Reviewed patient's current diet. Advised and counseled on a whole foods based healthy diet. A summary of a healthy diet was provided in the Patient Instructions.  -reviewed patient's current physical activity level and discussed exercise guidelines for adults. Discussed community resources and ideas for safe exercise at home to assist in meeting exercise guideline recommendations in a safe and healthy way.  -Advise yearly dental visits at  minimum and regular eye exams -Advised and counseled on alcohol safe limits, risks  Follow up: see patient instructions     Patient Instructions  I really enjoyed getting to talk with you today! I am available on Tuesdays and Thursdays for virtual visits if you have any questions or concerns, or if I can be of any further assistance.   CHECKLIST FROM ANNUAL WELLNESS VISIT:  -Follow up (please call to schedule if not scheduled after visit):   -yearly for annual wellness visit with primary care office  Here is a list of your preventive care/health maintenance measures and the plan for each if any are due:  PLAN For any measures below that may be due:  -can get vaccines at the pharmacy, let us know when you do so that we can update your record -call your gastroenterologist about the colonoscopy -please let us know if you change your mind about the bone density  Health Maintenance  Topic Date Due   Zoster Vaccines- Shingrix (1 of 2) Never done   DTaP/Tdap/Td (2 - Td or Tdap) 03/12/2021   Colonoscopy  03/01/2023   COVID-19 Vaccine (5 - 2023-24 season) 03/11/2023 (Originally 01/17/2023)   Medicare Annual Wellness (AWV)  02/23/2024   MAMMOGRAM  05/22/2024   Pneumonia Vaccine 74+ Years old  Completed   INFLUENZA VACCINE  Completed   DEXA SCAN  Completed   Hepatitis C Screening  Completed   HPV VACCINES  Aged Out    -See a dentist at least yearly  -Get your eyes checked and then per your eye specialist's recommendations  -Other issues addressed today:   -I have included below further information regarding a healthy whole foods based diet, physical activity guidelines for adults, stress management and opportunities for social connections. I hope you find this information useful.    -----------------------------------------------------------------------------------------------------------------------------------------------------------------------------------------------------------------------------------------------------------  NUTRITION: -eat real food: lots of colorful vegetables (half the plate) and fruits -5-7 servings of vegetables and fruits per day (fresh or steamed is best), exp. 2 servings of vegetables with lunch and dinner and 2 servings of fruit per day. Berries and greens such as kale and collards are great choices.  -consume on a regular basis: whole grains (make sure first ingredient on label contains the word "whole"), fresh fruits, fish, nuts, seeds, healthy oils (such as olive oil, avocado oil, grape seed oil) -may eat small amounts of dairy and lean meat on occasion, but avoid processed meats such as ham, bacon, lunch meat, etc. -drink water -try to avoid fast food and pre-packaged foods, processed meat -most experts advise limiting sodium to < 2300mg  per day, should limit further is any chronic conditions such as high blood pressure, heart disease, diabetes, etc. The American Heart Association advised that < 1500mg  is is ideal -try to avoid foods that contain any ingredients with names you do not recognize  -try to avoid sugar/sweets (except for the natural sugar that occurs in fresh fruit) -try to avoid sweet drinks -try to avoid white rice, white bread, pasta (unless whole grain), white or yellow potatoes  EXERCISE GUIDELINES FOR ADULTS: -if you wish to increase your physical activity, do so gradually and with the approval of your doctor -STOP and seek medical care immediately if you have any chest pain, chest discomfort or trouble breathing when starting or increasing exercise  -move and stretch your body, legs, feet and arms when sitting for long periods -Physical activity guidelines for optimal health in adults: -least 150 minutes per week of  aerobic exercise (can talk, but not sing) once approved by your doctor, 20-30 minutes of sustained activity or two 10 minute episodes of sustained activity every day.  -resistance training at least 2 days per week if approved by your doctor -balance exercises 3+ days per week:   Stand somewhere where you have something sturdy to hold onto if you lose balance.    1) lift up on toes, start with 5x per day and work up to 20x   2) stand and lift on leg straight out to the side so that foot is a few inches of the floor, start with 5x each side and work up to 20x each side   3) stand on one foot, start with 5 seconds each side and work up to 20 seconds on each side  If you need ideas or help with getting more active:  -Silver sneakers https://tools.silversneakers.com  -Walk with a Doc: http://www.duncan-williams.com/  -try to include resistance (weight lifting/strength building) and balance exercises twice per week: or the following link for ideas: http://castillo-powell.com/  BuyDucts.dk  STRESS MANAGEMENT: -can try meditating, or just sitting quietly with deep breathing while intentionally relaxing all parts of your body for 5 minutes daily -if you need further help with stress, anxiety or depression  please follow up with your primary doctor or contact the wonderful folks at [facility]: [phone]  SOCIAL CONNECTIONS: -options in [place] if you wish to engage in more social and exercise related activities:  -Silver sneakers https://tools.silversneakers.com  -Walk with a Doc: http://www.duncan-williams.com/  -Check out the [facility] Active Adults 50+ section on the [place] [employer] (hiking clubs, book clubs, cards and games, chess, exercise classes, aquatic classes and much more) - see the website for  details: https://www.[place].gov/departments/parks-recreation/active-adults50  -YouTube has lots of exercise videos for different ages and abilities as well  -[facility] (a variety of indoor and outdoor inperson activities for adults). [phone]. [address]  -Virtual Online Classes (a variety of topics): see seniorplanet.org or call [phone]  -consider volunteering at a school, hospice center, church, senior center or elsewhere           Terressa Koyanagi, DO

## 2023-02-23 NOTE — Patient Instructions (Signed)
I really enjoyed getting to talk with you today! I am available on Tuesdays and Thursdays for virtual visits if you have any questions or concerns, or if I can be of any further assistance.   CHECKLIST FROM ANNUAL WELLNESS VISIT:  -Follow up (please call to schedule if not scheduled after visit):   -yearly for annual wellness visit with primary care office  Here is a list of your preventive care/health maintenance measures and the plan for each if any are due:  PLAN For any measures below that may be due:  -can get vaccines at the pharmacy, let us know when you do so that we can update your record -call your gastroenterologist about the colonoscopy -please let us know if you change your mind about the bone density  Health Maintenance  Topic Date Due   Zoster Vaccines- Shingrix (1 of 2) Never done   DTaP/Tdap/Td (2 - Td or Tdap) 03/12/2021   Colonoscopy  03/01/2023   COVID-19 Vaccine (5 - 2023-24 season) 03/11/2023 (Originally 01/17/2023)   Medicare Annual Wellness (AWV)  02/23/2024   MAMMOGRAM  05/22/2024   Pneumonia Vaccine 74+ Years old  Completed   INFLUENZA VACCINE  Completed   DEXA SCAN  Completed   Hepatitis C Screening  Completed   HPV VACCINES  Aged Out    -See a dentist at least yearly  -Get your eyes checked and then per your eye specialist's recommendations  -Other issues addressed today:   -I have included below further information regarding a healthy whole foods based diet, physical activity guidelines for adults, stress management and opportunities for social connections. I hope you find this information useful.   -----------------------------------------------------------------------------------------------------------------------------------------------------------------------------------------------------------------------------------------------------------  NUTRITION: -eat real food: lots of colorful vegetables (half the plate) and fruits -5-7 servings of  vegetables and fruits per day (fresh or steamed is best), exp. 2 servings of vegetables with lunch and dinner and 2 servings of fruit per day. Berries and greens such as kale and collards are great choices.  -consume on a regular basis: whole grains (make sure first ingredient on label contains the word "whole"), fresh fruits, fish, nuts, seeds, healthy oils (such as olive oil, avocado oil, grape seed oil) -may eat small amounts of dairy and lean meat on occasion, but avoid processed meats such as ham, bacon, lunch meat, etc. -drink water -try to avoid fast food and pre-packaged foods, processed meat -most experts advise limiting sodium to < 2300mg  per day, should limit further is any chronic conditions such as high blood pressure, heart disease, diabetes, etc. The American Heart Association advised that < 1500mg  is is ideal -try to avoid foods that contain any ingredients with names you do not recognize  -try to avoid sugar/sweets (except for the natural sugar that occurs in fresh fruit) -try to avoid sweet drinks -try to avoid white rice, white bread, pasta (unless whole grain), white or yellow potatoes  EXERCISE GUIDELINES FOR ADULTS: -if you wish to increase your physical activity, do so gradually and with the approval of your doctor -STOP and seek medical care immediately if you have any chest pain, chest discomfort or trouble breathing when starting or increasing exercise  -move and stretch your body, legs, feet and arms when sitting for long periods -Physical activity guidelines for optimal health in adults: -least 150 minutes per week of aerobic exercise (can talk, but not sing) once approved by your doctor, 20-30 minutes of sustained activity or two 10 minute episodes of sustained activity every day.  -resistance training  at least 2 days per week if approved by your doctor -balance exercises 3+ days per week:   Stand somewhere where you have something sturdy to hold onto if you lose  balance.    1) lift up on toes, start with 5x per day and work up to 20x   2) stand and lift on leg straight out to the side so that foot is a few inches of the floor, start with 5x each side and work up to 20x each side   3) stand on one foot, start with 5 seconds each side and work up to 20 seconds on each side  If you need ideas or help with getting more active:  -Silver sneakers https://tools.silversneakers.com  -Walk with a Doc: http://www.duncan-williams.com/  -try to include resistance (weight lifting/strength building) and balance exercises twice per week: or the following link for ideas: http://castillo-powell.com/  BuyDucts.dk  STRESS MANAGEMENT: -can try meditating, or just sitting quietly with deep breathing while intentionally relaxing all parts of your body for 5 minutes daily -if you need further help with stress, anxiety or depression please follow up with your primary doctor or contact the wonderful folks at [facility]: [phone]  SOCIAL CONNECTIONS: -options in [place] if you wish to engage in more social and exercise related activities:  -Silver sneakers https://tools.silversneakers.com  -Walk with a Doc: http://www.duncan-williams.com/  -Check out the [facility] Active Adults 50+ section on the [place] of [employer] (hiking clubs, book clubs, cards and games, chess, exercise classes, aquatic classes and much more) - see the website for details: https://www.[place].gov/departments/parks-recreation/active-adults50  -YouTube has lots of exercise videos for different ages and abilities as well  -[facility] (a variety of indoor and outdoor inperson activities for adults). [phone]. [address]  -Virtual Online Classes (a variety of topics): see seniorplanet.org or call ([phone]  -consider volunteering at a school, hospice  center, church, senior center or elsewhere

## 2023-02-24 ENCOUNTER — Encounter: Payer: Self-pay | Admitting: Gastroenterology

## 2023-03-01 ENCOUNTER — Encounter: Payer: Self-pay | Admitting: Physician Assistant

## 2023-03-08 ENCOUNTER — Encounter: Payer: Self-pay | Admitting: Physician Assistant

## 2023-03-08 ENCOUNTER — Telehealth: Payer: Self-pay

## 2023-03-08 ENCOUNTER — Other Ambulatory Visit: Payer: Self-pay | Admitting: Physician Assistant

## 2023-03-08 ENCOUNTER — Ambulatory Visit: Payer: Medicare PPO | Admitting: Physician Assistant

## 2023-03-08 VITALS — BP 130/70 | HR 69 | Ht 67.0 in | Wt 219.0 lb

## 2023-03-08 DIAGNOSIS — Z7901 Long term (current) use of anticoagulants: Secondary | ICD-10-CM | POA: Diagnosis not present

## 2023-03-08 DIAGNOSIS — K5909 Other constipation: Secondary | ICD-10-CM

## 2023-03-08 DIAGNOSIS — Z1211 Encounter for screening for malignant neoplasm of colon: Secondary | ICD-10-CM | POA: Diagnosis not present

## 2023-03-08 DIAGNOSIS — I471 Supraventricular tachycardia, unspecified: Secondary | ICD-10-CM | POA: Diagnosis not present

## 2023-03-08 DIAGNOSIS — Z860101 Personal history of adenomatous and serrated colon polyps: Secondary | ICD-10-CM | POA: Diagnosis not present

## 2023-03-08 MED ORDER — PLENVU 140 G PO SOLR: 1.0000 | ORAL | 0 refills | Status: DC

## 2023-03-08 NOTE — Telephone Encounter (Signed)
Patient with diagnosis of afib on Eliquis for anticoagulation.    Procedure: colonoscopy Date of procedure: 05/26/23  CHA2DS2-VASc Score = 6  This indicates a 9.7% annual risk of stroke. The patient's score is based upon: CHF History: 0 HTN History: 1 Diabetes History: 0 Stroke History: 2 Vascular Disease History: 1 (aortic atherosclerosis) Age Score: 1 Gender Score: 1   Stroke in 2022 CrCl 42mL/min using adjusted body weight Platelet count 280K  Would prefer 1 day Eliquis hold given prior stroke in 2022. If 2 day hold is necessary, that is ok but pt will need to resume anticoag as soon as safely possible post-procedure.   **This guidance is not considered finalized until pre-operative APP has relayed final recommendations.**

## 2023-03-08 NOTE — Patient Instructions (Signed)
You have been scheduled for a colonoscopy. Please follow written instructions given to you at your visit today.   Please pick up your prep supplies at the pharmacy within the next 1-3 days.  If you use inhalers (even only as needed), please bring them with you on the day of your procedure.  DO NOT TAKE 7 DAYS PRIOR TO TEST- Trulicity (dulaglutide) Ozempic, Wegovy (semaglutide) Mounjaro (tirzepatide) Bydureon Bcise (exanatide extended release)  DO NOT TAKE 1 DAY PRIOR TO YOUR TEST Rybelsus (semaglutide) Adlyxin (lixisenatide) Victoza (liraglutide) Byetta (exanatide) ___________________________________________________________________________  WE WILL CONTACT YOU ABOUT HOLDING YOUR ELIQUIS 2 DAYS PRIOR TO YOUR COLON.   Start over the counter miralax daily.  I appreciate the opportunity to care for you. Hyacinth Meeker, PA-C

## 2023-03-08 NOTE — Progress Notes (Signed)
Chief Complaint:  Discuss Colonoscopy on Eliquis  HPI:  Glenda [name] is a 72 year old female, known to Dr. Russella Dar, with a past medical history as listed below including paroxysmal atrial tachycardia and previous stroke on Eliquis (03/06/2021 echo with LVEF 60-65%), who was referred to me by [place], Betty G, MD for discussion of a colonoscopy on chronic anticoagulation.    02/29/2020 colonoscopy with Dr. Russella Dar with three 7-8 mm polyps in the transverse colon and cecum and mild diverticulosis in the left colon with internal hemorrhoids.  Pathology showed mixture of tubular adenomas and sessile serrated polyps.  Repeat recommended in 3 years.    Today, patient tells me she is doing well.  She is just due for her next colonoscopy.  Apparently has been started on the Eliquis in between her last colonoscopy and now but has had to hold this for procedures in the past.  Does discuss some constipation that has developed with the new medicine she is on, can sometimes go 5 days without a bowel movement, currently trying a fiber wafer which sometimes works and sometimes does not.    Denies fever, chills or weight loss.  Past Medical History:  Diagnosis Date   Allergic rhinitis    Allergy    Anxiety    Arthritis    Cataract    Heart murmur    History of colonic polyps    History of PSVT (paroxysmal supraventricular tachycardia)    Hypertension    Impaired glucose tolerance    Obesity    PAT (paroxysmal atrial tachycardia) (HCC)    Pre-diabetes    hx, not currently being monitor for diabetes, diet controlled   Stroke (HCC) 08/2020   Scan revealed stroke in history   Tobacco user    Vertigo    started 3 weeks ago- after fluid trapped in ear    Past Surgical History:  Procedure Laterality Date   ankle sx-left fx     ANTERIOR CERVICAL DECOMP/DISCECTOMY FUSION N/A 10/28/2020   Procedure: Anterior Cervical Decompression Fusion - Cervical Four-Cervical Five - Cervical Five-Cervical Six - Cervical Six-  Cervical Seven;  Surgeon: Donalee Citrin, MD;  Location[facility] OR;  Service: Neurosurgery;  Laterality: N/A;  Anterior Cervical Decompression Fusion - Cervical Four-Cervical Five - Cervical Five-Cervical Six - Cervical Six- Cervical Seven   ATRIAL FIBRILLATION ABLATION N/A 09/05/2021   Procedure: ATRIAL FIBRILLATION ABLATION;  Surgeon: Regan Lemming, MD;  Location: [facility] INVASIVE CV LAB;  Service: Cardiovascular;  Laterality: N/A;   CARDIOVERSION N/A 11/28/2020   Procedure: CARDIOVERSION;  Surgeon: Wendall Stade, MD;  Location: [facility] ENDOSCOPY;  Service: Cardiovascular;  Laterality: N/A;   CARDIOVERSION N/A 04/15/2021   Procedure: CARDIOVERSION;  Surgeon: Chrystie Nose, MD;  Location: [facility] ENDOSCOPY;  Service: Cardiovascular;  Laterality: N/A;   CATARACT EXTRACTION     both eyes   CHOLECYSTECTOMY N/A 01/11/2018   Procedure: LAPAROSCOPIC CHOLECYSTECTOMY WITH INTRAOPERATIVE CHOLANGIOGRAM ERAS PATHWAY;  Surgeon: Ovidio Kin, MD;  Location: [facility] OR;  Service: General;  Laterality: N/A;   COLONOSCOPY  04/23/2005   DIAGNOSTIC LAPAROSCOPY     EYE SURGERY     foot sx     with the ankle surgery   POLYPECTOMY      Current Outpatient Medications  Medication Sig Dispense Refill   acetaminophen (TYLENOL) 500 MG tablet Take 1,000 mg by mouth 2 (two) times daily as needed for moderate pain.     ALPRAZolam (XANAX) 0.25 MG tablet Take 1 tablet (0.25 mg total) by mouth daily  as needed. for anxiety 30 tablet 1   apixaban (ELIQUIS) 5 MG TABS tablet TAKE 1 TABLET BY MOUTH TWICE A DAY 180 tablet 3   diclofenac Sodium (VOLTAREN) 1 % GEL Apply 4 g topically 4 (four) times daily. (Patient taking differently: Apply 4 g topically as needed.) 30 g 0   diphenhydramine-acetaminophen (TYLENOL PM) 25-500 MG TABS tablet Take 1 tablet by mouth at bedtime as needed (sleep).     flecainide (TAMBOCOR) 100 MG tablet TAKE 1 TABLET BY MOUTH TWICE A DAY 180 tablet 2   hydrochlorothiazide (HYDRODIURIL) 25 MG tablet TAKE 1/2 TABLET BY  MOUTH EVERY DAY 45 tablet 3   metoprolol succinate (TOPROL XL) 25 MG 24 hr tablet Take 1 tablet (25 mg total) by mouth at bedtime. 90 tablet 2   PARoxetine (PAXIL) 40 MG tablet TAKE 1 TABLET BY MOUTH EVERY DAY 90 tablet 2   rosuvastatin (CRESTOR) 10 MG tablet TAKE 1 TABLET BY MOUTH EVERY DAY 90 tablet 2   No current facility-administered medications for this visit.    Allergies as of 03/08/2023 - Review Complete 03/08/2023  Allergen Reaction Noted   Amoxicillin Rash and Other (See Comments)    Erythromycin Other (See Comments)     Family History  Problem Relation Age of Onset   Healthy Mother    Dementia Father    Healthy Sister    Healthy Sister    Alzheimer's disease Other    Colon cancer Neg Hx    Esophageal cancer Neg Hx    Rectal cancer Neg Hx    Stomach cancer Neg Hx    Colon polyps Neg Hx     Social History   Socioeconomic History   Marital status: Married    Spouse name: Not on file   Number of children: 2   Years of education: college   Highest education level: Not on file  Occupational History   Occupation: Retired  Tobacco Use   Smoking status: Former    Current packs/day: 0.00    Average packs/day: 0.3 packs/day for 50.0 years (12.5 ttl pk-yrs)    Types: Cigarettes    Start date: 10/18/1970    Quit date: 10/17/2020    Years since quitting: 2.3   Smokeless tobacco: Never   Tobacco comments:    Former smoker 06/10/22  Vaping Use   Vaping status: Never Used  Substance and Sexual Activity   Alcohol use: Yes    Alcohol/week: 2.0 standard drinks of alcohol    Types: 2 Standard drinks or equivalent per week    Comment: weekends 06/10/22   Drug use: No   Sexual activity: Yes  Other Topics Concern   Not on file  Social History Narrative   Lives at home with her husband.   Right-handed.   One cup caffeine per day.   Social Determinants of Health   Financial Resource Strain: Low Risk  (02/19/2022)   Overall Financial Resource Strain (CARDIA)     Difficulty of Paying Living Expenses: Not hard at all  Food Insecurity: No Food Insecurity (03/13/2022)   Hunger Vital Sign    Worried About Running Out of Food in the Last Year: Never true    Ran Out of Food in the Last Year: Never true  Transportation Needs: No Transportation Needs (03/13/2022)   PRAPARE - Administrator, Civil Service (Medical): No    Lack of Transportation (Non-Medical): No  Physical Activity: Inactive (02/19/2022)   Exercise Vital Sign    Days of  Exercise per Week: 0 days    Minutes of Exercise per Session: 0 min  Stress: No Stress Concern Present (02/19/2022)   [employer] of Occupational Health - Occupational Stress Questionnaire    Feeling of Stress : Not at all  Social Connections: Moderately Isolated (02/19/2022)   Social Connection and Isolation Panel [NHANES]    Frequency of Communication with Friends and Family: More than three times a week    Frequency of Social Gatherings with Friends and Family: More than three times a week    Attends Religious Services: Never    Database administrator or Organizations: No    Attends Banker Meetings: Never    Marital Status: Married  Catering manager Violence: Not At Risk (02/19/2022)   Humiliation, Afraid, Rape, and Kick questionnaire    Fear of Current or Ex-Partner: No    Emotionally Abused: No    Physically Abused: No    Sexually Abused: No    Review of Systems:    Constitutional: No weight loss, fever or chills Skin: No rash or itching Cardiovascular: No chest pain, chest pressure or palpitations   Respiratory: No SOB or cough Gastrointestinal: See HPI and otherwise negative Genitourinary: No dysuria or change in urinary frequency Neurological: No headache, dizziness or syncope Musculoskeletal: No new muscle or joint pain Hematologic: No bleeding  Psychiatric: No history of depression or anxiety   Physical Exam:  Vital signs: BP 130/70   Pulse 69   Ht 5\' 7"  (1.702 m)    Wt 219 lb (99.3 kg)   BMI 34.30 kg/m   Constitutional:   Pleasant overweight Caucasian female appears to be in NAD, Well developed, Well nourished, alert and cooperative Head:  Normocephalic and atraumatic. Eyes:   PEERL, EOMI. No icterus. Conjunctiva pink. Ears:  Normal auditory acuity. Neck:  Supple Throat: Oral cavity and pharynx without inflammation, swelling or lesion.  Respiratory: Respirations even and unlabored. Lungs clear to auscultation bilaterally.   No wheezes, crackles, or rhonchi.  Cardiovascular: Normal S1, S2. No MRG. Regular rate and rhythm. No peripheral edema, cyanosis or pallor.  Gastrointestinal:  Soft, nondistended, nontender. No rebound or guarding. Normal bowel sounds. No appreciable masses or hepatomegaly. Rectal:  Not performed.  Msk:  Symmetrical without gross deformities. Without edema, no deformity or joint abnormality.  Neurologic:  Alert and  oriented x4;  grossly normal neurologically.  Skin:   Dry and intact without significant lesions or rashes. Psychiatric: Demonstrates good judgement and reason without abnormal affect or behaviors.  RELEVANT LABS AND IMAGING: CBC    Component Value Date/Time   WBC 7.6 08/22/2021 0806   WBC 9.8 04/01/2021 1600   RBC 4.24 08/22/2021 0806   RBC 4.22 04/01/2021 1600   HGB 13.2 08/22/2021 0806   HCT 39.7 08/22/2021 0806   PLT 280 08/22/2021 0806   MCV 94 08/22/2021 0806   MCH 31.1 08/22/2021 0806   MCH 31.5 04/01/2021 1600   MCHC 33.2 08/22/2021 0806   MCHC 32.0 04/01/2021 1600   RDW 12.5 08/22/2021 0806   LYMPHSABS 1.6 06/16/2020 1737   MONOABS 0.6 06/16/2020 1737   EOSABS 0.2 06/16/2020 1737   BASOSABS 0.1 06/16/2020 1737    CMP     Component Value Date/Time   NA 140 03/10/2022 0943   NA 142 08/22/2021 0806   K 4.2 03/10/2022 0943   CL 99 03/10/2022 0943   CO2 32 03/10/2022 0943   GLUCOSE 99 03/30/2022 0956   BUN 17 03/10/2022 0943  BUN 17 08/22/2021 0806   CREATININE 0.71 03/10/2022 0943    CREATININE 0.78 11/24/2019 1052   CALCIUM 9.8 03/10/2022 0943   PROT 7.8 03/10/2022 0943   ALBUMIN 4.2 03/10/2022 0943   AST 23 03/10/2022 0943   ALT 18 03/10/2022 0943   ALKPHOS 87 03/10/2022 0943   BILITOT 0.9 03/10/2022 0943   GFRNONAA >60 04/01/2021 1600   GFRAA >60 01/05/2018 0946    Assessment: 1.  History of adenomatous polyps: Last colonoscopy in October 2021 with repeat recommended in 3 years, patient due now 2.  Chronic anticoagulation for paroxysmal atrial tachycardia: On Eliquis 3.  Constipation: Medication side effect  Plan: 1.  Patient wished to be scheduled for her colonoscopy in January and requested Dr. Tomasa Rand.  Did provide the patient with a detailed list of risks for the procedure and she agrees to proceed. Patient is appropriate for endoscopic procedure(s) in the ambulatory ([facility]) setting.  2.  Patient was advised to hold her Eliquis for 2 days prior to time of procedure.  We will communicate with her prescribing physician to ensure this is acceptable for her. 3.  Patient given a 2-day bowel prep given history of constipation.  Also discussed starting MiraLAX on a daily basis and titrating as needed. 4.  Patient to follow in clinic per recommendations after time of procedure.  Glenda Meeker, PA-C [facility] 03/08/2023, 1:15 PM  Cc: [place], Betty G, MD

## 2023-03-08 NOTE — Telephone Encounter (Signed)
Pt has been scheduled to see Gillian Shields, NP 04/23/23 @ 8:50 for pre op clearance. I will update all parties involved.

## 2023-03-08 NOTE — Telephone Encounter (Signed)
    Primary Cardiologist:Bridgette Cristal Deer, MD  Chart reviewed as part of pre-operative protocol coverage. Because of Glenda [name]'s past medical history and time since last visit, he/she will require a follow-up visit in order to better assess preoperative cardiovascular risk.  Pre-op covering staff: - Please schedule in office appointment and call patient to inform them. - Please contact requesting surgeon's office via preferred method (i.e, phone, fax) to inform them of need for appointment prior to surgery.  Would prefer 1 day Eliquis hold given prior stroke in 2022. If 2 day hold is necessary, that is ok but pt will need to resume anticoag as soon as safely possible post-procedure.   If applicable, this message will also be routed to pharmacy pool and/or primary cardiologist for input on holding anticoagulant/antiplatelet agent as requested below so that this information is available at time of patient's appointment.   Glenda Asters, NP  03/08/2023, 3:22 PM

## 2023-03-08 NOTE — Telephone Encounter (Signed)
[facility] Pre-operative Risk Assessment     Request for surgical clearance:     Endoscopy Procedure  What type of surgery is being performed?     colonoscopy  When is this surgery scheduled?     05/26/2023  What type of clearance is required ?   Pharmacy  Are there any medications that need to be held prior to surgery and how long? Eliquis, 2 days  Practice name and name of physician performing surgery?      [facility]  What is your office phone and fax number?      Phone- [phone]  Fax- [phone]  Anesthesia type (None, local, MAC, general) ?       MAC

## 2023-03-09 NOTE — Progress Notes (Signed)
Agree with the assessment and plan as outlined by Jennifer Lemmon, PA-C. ? ?Raevyn Sokol E. Audrinna Sherman, MD ? ?

## 2023-03-09 NOTE — Telephone Encounter (Signed)
FYI

## 2023-03-10 ENCOUNTER — Other Ambulatory Visit: Payer: Self-pay | Admitting: Physician Assistant

## 2023-03-10 ENCOUNTER — Other Ambulatory Visit: Payer: Self-pay | Admitting: Family Medicine

## 2023-03-26 ENCOUNTER — Other Ambulatory Visit: Payer: Self-pay | Admitting: *Deleted

## 2023-03-26 ENCOUNTER — Telehealth: Payer: Self-pay | Admitting: Physician Assistant

## 2023-03-26 MED ORDER — NA SULFATE-K SULFATE-MG SULF 17.5-3.13-1.6 GM/177ML PO SOLN: 1.0000 | Freq: Once | ORAL | 0 refills | Status: AC

## 2023-03-26 NOTE — Telephone Encounter (Signed)
Called and left message asking if the Suprep is ok for the patient to use prior to the colonoscopy since she will not be using the Plenvu Powder Packets. Left message for the patient to call back.

## 2023-03-26 NOTE — Telephone Encounter (Signed)
Error

## 2023-03-26 NOTE — Telephone Encounter (Signed)
Called patient to speak to patient to find out if it were a problem with having taken the Suprep in the past. Patient informed the nurse, she has taken the Suprep in the past and did not know why she was ordered the Plenvu Powder Packets. Ordered the Suprep as directed via Ms. Lemmon,PA. Patient states she has prep instructions prior to her colonoscopy dated for 06/01/23.

## 2023-04-23 ENCOUNTER — Ambulatory Visit (HOSPITAL_BASED_OUTPATIENT_CLINIC_OR_DEPARTMENT_OTHER): Payer: Medicare PPO | Admitting: Family

## 2023-04-23 ENCOUNTER — Encounter (HOSPITAL_BASED_OUTPATIENT_CLINIC_OR_DEPARTMENT_OTHER): Payer: Self-pay | Admitting: Family

## 2023-04-23 ENCOUNTER — Other Ambulatory Visit: Payer: Self-pay | Admitting: Family Medicine

## 2023-04-23 VITALS — BP 128/64 | HR 64 | Ht 67.0 in | Wt 219.6 lb

## 2023-04-23 DIAGNOSIS — Z01810 Encounter for preprocedural cardiovascular examination: Secondary | ICD-10-CM | POA: Diagnosis not present

## 2023-04-23 DIAGNOSIS — G4733 Obstructive sleep apnea (adult) (pediatric): Secondary | ICD-10-CM

## 2023-04-23 DIAGNOSIS — Z1231 Encounter for screening mammogram for malignant neoplasm of breast: Secondary | ICD-10-CM

## 2023-04-23 DIAGNOSIS — E785 Hyperlipidemia, unspecified: Secondary | ICD-10-CM | POA: Diagnosis not present

## 2023-04-23 DIAGNOSIS — D6859 Other primary thrombophilia: Secondary | ICD-10-CM | POA: Diagnosis not present

## 2023-04-23 DIAGNOSIS — I10 Essential (primary) hypertension: Secondary | ICD-10-CM | POA: Diagnosis not present

## 2023-04-23 DIAGNOSIS — Z79899 Other long term (current) drug therapy: Secondary | ICD-10-CM | POA: Diagnosis not present

## 2023-04-23 DIAGNOSIS — I4819 Other persistent atrial fibrillation: Secondary | ICD-10-CM | POA: Diagnosis not present

## 2023-04-23 DIAGNOSIS — R739 Hyperglycemia, unspecified: Secondary | ICD-10-CM

## 2023-04-23 MED ORDER — HYDROCHLOROTHIAZIDE 12.5 MG PO CAPS: 12.5000 mg | ORAL_CAPSULE | Freq: Every day | ORAL | 3 refills | Status: DC

## 2023-04-23 NOTE — Patient Instructions (Addendum)
Medication Instructions:  Your physician recommends that you continue on your current medications as directed. Please refer to the Current Medication list given to you today.  You can hold Eliquis 1-2 days before your colonoscopy.  *If you need a refill on your cardiac medications before your next appointment, please call your pharmacy*   Lab Work: CMP, CBC, A1C, Magnesium, Lipid Panel today   If you have labs (blood work) drawn today and your tests are completely normal, you will receive your results only by: MyChart Message (if you have MyChart) OR A paper copy in the mail If you have any lab test that is abnormal or we need to change your treatment, we will call you to review the results.   Follow-Up: At [facility], you and your health needs are our priority.  As part of our continuing mission to provide you with exceptional heart care, we have created designated Provider Care Teams.  These Care Teams include your primary Cardiologist (physician) and Advanced Practice Providers (APPs -  Physician Assistants and Nurse Practitioners) who all work together to provide you with the care you need, when you need it.  We recommend signing up for the patient portal called "MyChart".  Sign up information is provided on this After Visit Summary.  MyChart is used to connect with patients for Virtual Visits (Telemedicine).  Patients are able to view lab/test results, encounter notes, upcoming appointments, etc.  Non-urgent messages can be sent to your provider as well.   To learn more about what you can do with MyChart, go to [email or web address].    Your next appointment:   1 year(s)  Provider:   Jodelle Red, MD or Gillian Shields, NP    Other Instructions

## 2023-04-23 NOTE — Progress Notes (Signed)
Cardiology Office Note:  .   Date:  04/23/2023  ID:  ALAYASIA [name], DOB 1951-03-28, MRN [id number] PCP: [place], Betty G, MD  [facility] Providers Cardiologist:  Jodelle Red, MD Electrophysiologist:  Will Jorja Loa, MD  Sleep Medicine:  Nicki Guadalajara, MD    History of Present Illness: .   Glenda [name] is a 72 y.o. female with history of CVA, hypertension, OSA on CPAP, aortic atherosclerosis, coronary calcification on CT scan atrial fibrillation s/p ablation 09/05/2021.  Routinely follows with the A-fib clinic.  Last seen 11/2021 doing well with no recurrence of atrial fibrillation.  Most recent lipid panel 03/06/2022 total cholesterol 107, triglycerides 82, HDL 43, LDL 47.  Presents today for follow-up for colonoscopy you scheduled 05/26/2023.  Per Pharm.D. team and office protocols, prefer 1 day Eliquis hold given prior stroke in 2022 but if 2-day hold is necessary would be permissible. Recommended to resume anticoagulation as soon as possible after procedure. Feeling overall well, enjoys spending time with her grandchildren.  Note she has not used CPAP and sometimes found it uncomfortable.. Reports no shortness of breath nor dyspnea on exertion. Reports no chest pain, pressure, or tightness. No edema, orthopnea, PND. Reports rare palpitations with brief episodes of atrial fibrillation by her Apple Watch which are not bothersome.  ROS: Please see the history of present illness.    All other systems reviewed and are negative.   Studies Reviewed: Marland Kitchen   EKG Interpretation Date/Time:  Friday April 23 2023 09:08:27 EST Ventricular Rate:  56 PR Interval:  120 QRS Duration:  90 QT Interval:  414 QTC Calculation: 399 R Axis:   81  Text Interpretation: Junctional bradycardia No acute ST/T wave changes. Confirmed by Gillian Shields ([id number]) on 04/23/2023 10:18:23 AM    Cardiac Studies & Procedures     STRESS TESTS  MYOCARDIAL PERFUSION IMAGING 03/18/2021  Narrative    The study is normal. The study is low risk.   No ST deviation was noted.   Left ventricular function is normal. Nuclear stress EF: 72 %. The left ventricular ejection fraction is hyperdynamic (>65%). End diastolic cavity size is normal.   Prior study not available for comparison.  Findings: Negative for stress induced arrhythmias. No evidence of ischemia or infarction.  Conclusions: Stress test is negative for ischemia or infarct.   ECHOCARDIOGRAM  ECHOCARDIOGRAM COMPLETE 03/06/2021  Narrative ECHOCARDIOGRAM REPORT    Patient Name:   Glenda [name] Date of Exam: 03/06/2021 Medical Rec #:  [id number]     Height:       67.0 in Accession #:    [id number]    Weight:       215.2 lb Date of Birth:  [birth year]      BSA:          2.086 m Patient Age:    70 years      BP:           142/74 mmHg Patient Gender: F             HR:           72 bpm. Exam Location:  Outpatient  Procedure: 2D Echo  Indications:    atrial fibrillation  History:        Patient has prior history of Echocardiogram examinations, most recent 09/13/2020. Risk Factors:Hypertension and Dyslipidemia.  Sonographer:    Delcie Roch RDCS Referring Phys: [phone] DONNA C CARROLL   Sonographer Comments: Technically difficult study due to poor echo windows. Image  acquisition challenging due to patient body habitus and heavy breasts. IMPRESSIONS   1. Left ventricular ejection fraction, by estimation, is 60 to 65%. The left ventricle has normal function. The left ventricle has no regional wall motion abnormalities. Left ventricular diastolic parameters are indeterminate. 2. Right ventricular systolic function is normal. The right ventricular size is normal. There is normal pulmonary artery systolic pressure. 3. The mitral valve is normal in structure. No evidence of mitral valve regurgitation. No evidence of mitral stenosis. 4. The aortic valve was not well visualized. Aortic valve regurgitation is not visualized. 5.  The inferior vena cava is normal in size with greater than 50% respiratory variability, suggesting right atrial pressure of 3 mmHg.  Comparison(s): A prior study was performed on 09/13/20. No significant change from prior study.  FINDINGS Left Ventricle: Left ventricular ejection fraction, by estimation, is 60 to 65%. The left ventricle has normal function. The left ventricle has no regional wall motion abnormalities. The left ventricular internal cavity size was normal in size. There is no left ventricular hypertrophy. Left ventricular diastolic parameters are indeterminate.  Right Ventricle: The right ventricular size is normal. No increase in right ventricular wall thickness. Right ventricular systolic function is normal. There is normal pulmonary artery systolic pressure. The tricuspid regurgitant velocity is 2.09 m/s, and with an assumed right atrial pressure of 3 mmHg, the estimated right ventricular systolic pressure is 20.5 mmHg.  Left Atrium: Left atrial size was normal in size.  Right Atrium: Right atrial size was not well visualized.  Pericardium: There is no evidence of pericardial effusion. Presence of pericardial fat pad.  Mitral Valve: The mitral valve is normal in structure. No evidence of mitral valve regurgitation. No evidence of mitral valve stenosis.  Tricuspid Valve: The tricuspid valve is not well visualized. Tricuspid valve regurgitation is trivial.  Aortic Valve: The aortic valve was not well visualized. Aortic valve regurgitation is not visualized.  Pulmonic Valve: The pulmonic valve was not well visualized. Pulmonic valve regurgitation is not visualized. No evidence of pulmonic stenosis.  Aorta: The aortic root and ascending aorta are structurally normal, with no evidence of dilitation.  Venous: The inferior vena cava is normal in size with greater than 50% respiratory variability, suggesting right atrial pressure of 3 mmHg.  IAS/Shunts: The atrial septum is  grossly normal.   LEFT VENTRICLE PLAX 2D LVIDd:         3.90 cm LVIDs:         2.50 cm LV PW:         1.10 cm LV IVS:        1.00 cm LVOT diam:     1.60 cm LVOT Area:     2.01 cm   RIGHT VENTRICLE             IVC RV S prime:     11.20 cm/s  IVC diam: 1.60 cm  LEFT ATRIUM             Index LA diam:        3.70 cm 1.77 cm/m LA Vol (A2C):   43.0 ml 20.61 ml/m LA Vol (A4C):   47.1 ml 22.58 ml/m LA Biplane Vol: 46.4 ml 22.24 ml/m  AORTA Ao Root diam: 2.40 cm Ao Asc diam:  2.60 cm  TRICUSPID VALVE TR Peak grad:   17.5 mmHg TR Vmax:        209.00 cm/s  SHUNTS Systemic Diam: 1.60 cm  Riley Lam MD Electronically signed by Riley Lam MD  Signature Date/Time: 03/06/2021/2:05:31 PM    Final    MONITORS  LONG TERM MONITOR (3-14 DAYS) 02/06/2021  Narrative Persistent atrial fibrillation (Burden 100%) Ventricular rates ranging from 34-173 bpm (avg of 72 bpm). No prolonged pauses           Risk Assessment/Calculations:    CHA2DS2-VASc Score = 6   This indicates a 9.7% annual risk of stroke. The patient's score is based upon: CHF History: 0 HTN History: 1 Diabetes History: 0 Stroke History: 2 Vascular Disease History: 1 (aortic atherosclerosis) Age Score: 1 Gender Score: 1            Physical Exam:   VS:  BP 128/64   Pulse 64   Ht 5\' 7"  (1.702 m)   Wt 219 lb 9.6 oz (99.6 kg)   SpO2 98%   BMI 34.39 kg/m    Wt Readings from Last 3 Encounters:  04/23/23 219 lb 9.6 oz (99.6 kg)  03/08/23 219 lb (99.3 kg)  02/23/23 217 lb (98.4 kg)    GEN: Well nourished, overweight, well developed in no acute distress NECK: No JVD; No carotid bruits CARDIAC: RRR, no murmurs, rubs, gallops RESPIRATORY:  Clear to auscultation without rales, wheezing or rhonchi  ABDOMEN: Soft, non-tender, non-distended EXTREMITIES:  No edema; No deformity   ASSESSMENT AND PLAN: .       Preop clearance Pending colonoscopy.According to the Revised Cardiac Risk  Index (RCRI), her Perioperative Risk of Major Cardiac Event is (%): 0.9. Her Functional Capacity in METs is: 6.36 according to the Duke Activity Status Index (DASI). Per AHA/ACC guidelines, she is deemed acceptable risk for the planned procedure without additional cardiovascular testing. Will route to surgical team so they are aware.   -Per pharmacy team preference to hold Eliquis 1 day prior to procedure given history of CVA.  However, if 2 day hold is necessary would be permissible but resume anticoagulation as soon as possible postprocedure.  Atrial Fibrillation Stable with rare brief palpitations. On Eliquis, Metoprolol, Flecainide. Refills provided. -CHA2DS2-VASc Score = 6 [CHF History: 0, HTN History: 1, Diabetes History: 0, Stroke History: 2, Vascular Disease History: 1 (aortic atherosclerosis), Age Score: 1, Gender Score: 1].  Therefore, the patient's annual risk of stroke is 9.7 %.    Denies bleeding complications. -Check CMP, magnesium, and CBC today for monitoring of anticoagulation and Flecainide therapy.  Obstructive Sleep Apnea (OSA) Not using CPAP therapy. Discussed the potential for untreated OSA to exacerbate atrial fibrillation. -Consider alternative treatments for OSA, including dental device or Inspire device. She will contact us if interested.  Hypertension Stable on Metoprolol and Hydrochlorothiazide. Discussed potential for lightheadedness with low blood pressure. -Change Hydrochlorothiazide to 12.5mg  tablets (instead of half of 25mg  tablet) for ease of administration. -Monitor blood pressure at home and report any consistently low readings.  Hyperlipidemia, LDL goal <70 / Coronary calcification on CT /Aortic Atherosclerosis Last lipid panel on 03/10/2022 showed total cholesterol 107, triglycerides 82, HDL 43, LDL 47. On Rosuvastatin. -Check lipid panel today to ensure continued efficacy of Rosuvastatin.  General Health Maintenance -Consider participation in the PREP  exercise program at the local [employer]. She will contact us if interested in referral             Dispo: follow up in one year   Signed, Alver Sorrow, NP

## 2023-04-24 LAB — CBC
Hematocrit: 46.3 % (ref 34.0–46.6)
Hemoglobin: 15.2 g/dL (ref 11.1–15.9)
MCH: 32.1 pg (ref 26.6–33.0)
MCHC: 32.8 g/dL (ref 31.5–35.7)
MCV: 98 fL — ABNORMAL HIGH (ref 79–97)
Platelets: 305 10*3/uL (ref 150–450)
RBC: 4.74 x10E6/uL (ref 3.77–5.28)
RDW: 11.9 % (ref 11.7–15.4)
WBC: 8.3 10*3/uL (ref 3.4–10.8)

## 2023-04-24 LAB — COMPREHENSIVE METABOLIC PANEL
ALT: 19 [IU]/L (ref 0–32)
AST: 20 [IU]/L (ref 0–40)
Albumin: 4.3 g/dL (ref 3.8–4.8)
Alkaline Phosphatase: 101 [IU]/L (ref 44–121)
BUN/Creatinine Ratio: 19 (ref 12–28)
BUN: 19 mg/dL (ref 8–27)
Bilirubin Total: 1 mg/dL (ref 0.0–1.2)
CO2: 26 mmol/L (ref 20–29)
Calcium: 9.3 mg/dL (ref 8.7–10.3)
Chloride: 101 mmol/L (ref 96–106)
Creatinine, Ser: 1.02 mg/dL — ABNORMAL HIGH (ref 0.57–1.00)
Globulin, Total: 3.1 g/dL (ref 1.5–4.5)
Glucose: 117 mg/dL — ABNORMAL HIGH (ref 70–99)
Potassium: 4.4 mmol/L (ref 3.5–5.2)
Sodium: 142 mmol/L (ref 134–144)
Total Protein: 7.4 g/dL (ref 6.0–8.5)
eGFR: 58 mL/min/{1.73_m2} — ABNORMAL LOW (ref >59)

## 2023-04-24 LAB — LIPID PANEL
Chol/HDL Ratio: 3.3 {ratio} (ref 0.0–4.4)
Cholesterol, Total: 144 mg/dL (ref 100–199)
HDL: 43 mg/dL (ref >39)
LDL Chol Calc (NIH): 81 mg/dL (ref 0–99)
Triglycerides: 109 mg/dL (ref 0–149)
VLDL Cholesterol Cal: 20 mg/dL (ref 5–40)

## 2023-04-24 LAB — HEMOGLOBIN A1C
Est. average glucose Bld gHb Est-mCnc: 134 mg/dL
Hgb A1c MFr Bld: 6.3 % — ABNORMAL HIGH (ref 4.8–5.6)

## 2023-04-24 LAB — MAGNESIUM: Magnesium: 2 mg/dL (ref 1.6–2.3)

## 2023-04-26 ENCOUNTER — Other Ambulatory Visit (HOSPITAL_BASED_OUTPATIENT_CLINIC_OR_DEPARTMENT_OTHER): Payer: Self-pay

## 2023-04-26 ENCOUNTER — Encounter (HOSPITAL_BASED_OUTPATIENT_CLINIC_OR_DEPARTMENT_OTHER): Payer: Self-pay

## 2023-04-26 MED ORDER — ROSUVASTATIN CALCIUM 20 MG PO TABS: 10.0000 mg | ORAL_TABLET | Freq: Every day | ORAL | Status: DC

## 2023-04-26 MED ORDER — ROSUVASTATIN CALCIUM 20 MG PO TABS: 20.0000 mg | ORAL_TABLET | Freq: Every day | ORAL | 1 refills | Status: AC

## 2023-04-26 NOTE — Telephone Encounter (Signed)
Please advise. Thanks so much.

## 2023-04-26 NOTE — Telephone Encounter (Signed)
Sierrah has been informed and verbalized understanding to hold her Eliquis one day prior to her procedure.

## 2023-04-26 NOTE — Progress Notes (Signed)
Changing crestor dose.

## 2023-04-26 NOTE — Addendum Note (Signed)
Addended by: Alver Sorrow on: 04/26/2023 12:35 PM   Modules accepted: Orders

## 2023-04-26 NOTE — Telephone Encounter (Signed)
Dr Tomasa Rand please see what cardiology advised. Is the 1 day eliquis hold okay with you Sir? Thank you for your time.  Her colon is set up for 06/01/2023 at 9:30am.

## 2023-05-20 ENCOUNTER — Other Ambulatory Visit: Payer: Self-pay | Admitting: Family Medicine

## 2023-05-25 ENCOUNTER — Encounter: Payer: Self-pay | Admitting: Gastroenterology

## 2023-05-25 ENCOUNTER — Ambulatory Visit
Admission: RE | Admit: 2023-05-25 | Discharge: 2023-05-25 | Disposition: A | Discharge status: Home / Self Care | Payer: Medicare PPO | Source: Ambulatory Visit

## 2023-05-25 DIAGNOSIS — Z1231 Encounter for screening mammogram for malignant neoplasm of breast: Secondary | ICD-10-CM | POA: Diagnosis not present

## 2023-05-26 ENCOUNTER — Encounter: Payer: Medicare PPO | Admitting: Gastroenterology

## 2023-05-26 ENCOUNTER — Other Ambulatory Visit: Payer: Self-pay | Admitting: Family Medicine

## 2023-05-26 DIAGNOSIS — R928 Other abnormal and inconclusive findings on diagnostic imaging of breast: Secondary | ICD-10-CM

## 2023-05-27 ENCOUNTER — Encounter (HOSPITAL_COMMUNITY): Payer: Self-pay | Admitting: Physician Assistant

## 2023-05-27 ENCOUNTER — Ambulatory Visit (HOSPITAL_COMMUNITY)
Admission: RE | Admit: 2023-05-27 | Discharge: 2023-05-27 | Disposition: A | Discharge status: Home / Self Care | Payer: Medicare PPO | Source: Ambulatory Visit | Attending: Physician Assistant | Admitting: Physician Assistant

## 2023-05-27 VITALS — BP 142/74 | HR 53 | Ht 67.0 in | Wt 223.8 lb

## 2023-05-27 DIAGNOSIS — I10 Essential (primary) hypertension: Secondary | ICD-10-CM | POA: Insufficient documentation

## 2023-05-27 DIAGNOSIS — D6869 Other thrombophilia: Secondary | ICD-10-CM | POA: Insufficient documentation

## 2023-05-27 DIAGNOSIS — G4733 Obstructive sleep apnea (adult) (pediatric): Secondary | ICD-10-CM | POA: Insufficient documentation

## 2023-05-27 DIAGNOSIS — Z7901 Long term (current) use of anticoagulants: Secondary | ICD-10-CM | POA: Diagnosis not present

## 2023-05-27 DIAGNOSIS — I4819 Other persistent atrial fibrillation: Secondary | ICD-10-CM | POA: Diagnosis not present

## 2023-05-27 DIAGNOSIS — R001 Bradycardia, unspecified: Secondary | ICD-10-CM | POA: Insufficient documentation

## 2023-05-27 DIAGNOSIS — Z5181 Encounter for therapeutic drug level monitoring: Secondary | ICD-10-CM | POA: Diagnosis not present

## 2023-05-27 DIAGNOSIS — Z79899 Other long term (current) drug therapy: Secondary | ICD-10-CM

## 2023-05-27 MED ORDER — METOPROLOL SUCCINATE ER 25 MG PO TB24: 12.5000 mg | ORAL_TABLET | Freq: Every day | ORAL | 2 refills | Status: DC

## 2023-05-27 MED ORDER — FLECAINIDE ACETATE 100 MG PO TABS: ORAL_TABLET | ORAL | 2 refills | Status: DC

## 2023-05-27 NOTE — Patient Instructions (Signed)
 Decrease metoprolol  to 1/2 tablet once a day (12.5mg  daily)   Beginning in 2025, the prescription drug law requires all Medicare prescription drug plans (Medicare Part D plans)--including both standalone Medicare prescription drug plans and Medicare Advantage plans with prescription drug coverage--to offer Part D enrollees the option to pay out-of-pocket prescription drug costs in the form of monthly payments instead of all at once at the pharmacy. This program, called the Medicare Prescription Payment Plan, will be helpful for people with high cost sharing earlier in the plan year by spreading out those expenses over the course of the plan year.  What is the Medicare Prescription Payment Plan? The Medicare Prescription Payment Plan is a new payment option created under the Inflation Reduction Act that requires Part D plan sponsors to provide their enrollees with the option to pay out-of-pocket prescription drug costs in the form of monthly payments over the course of the plan year instead of all at once to the pharmacy. The program begins on May 19, 2023.  Program participants will pay $0 to the pharmacy for covered Part D drugs, and Part D plan sponsors will then bill program participants monthly for any cost sharing they incur while in the program. Pharmacies will be paid in full by the Part D sponsor in accordance with Part D prompt payment requirements  If there are issues with costs of your medications with new medicare changes ---call your DRUG INSURANCE to discuss payment plan to reduce your monthly costs.

## 2023-05-27 NOTE — Progress Notes (Addendum)
 Primary Care Physician: Jordan, Betty G, MD Primary Cardiologist: Shelda Bruckner, MD Electrophysiologist: Will Gladis Norton, MD  Referring Physician: Jolynn Pack [facility]   Glenda [name] is a 73 y.o. female with a history of CVA, HTN, OSA, aortic atherosclerosis, atrial fibrillation who presents for follow up in the [facility]. S/p afib ablation 09/05/21, maintained on flecainide . Patient is on Eliquis  for a CHADS2VASC score of 6.  On follow up today, patient reports that on 12/28 she felt more fatigued and checked her smart watch which showed a HR in the 40's. Apple Watch strips reviewed today which show SB, sinus with PACs, and some true afib. Patient reports she is no longer using her CPAP due to mask intolerance.   Today, she denies symptoms of chest pain, shortness of breath, orthopnea, PND, lower extremity edema, dizziness, presyncope, syncope, bleeding, or neurologic sequela. The patient is tolerating medications without difficulties and is otherwise without complaint today.    Atrial Fibrillation Risk Factors:  she does have symptoms or diagnosis of sleep apnea. Sleep study 05/27/21 she does not have a history of rheumatic fever.   Atrial Fibrillation Management history:  Previous antiarrhythmic drugs: flecainide   Previous cardioversions: 11/28/20, 04/15/21 Previous ablations: 09/05/21 Anticoagulation history: Eliquis   ROS- All systems are reviewed and negative except as per the HPI above.   Physical Exam: BP (!) 142/74   Pulse (!) 53   Ht 5' 7 (1.702 m)   Wt 101.5 kg   BMI 35.05 kg/m   GEN: Well nourished, well developed in no acute distress NECK: No JVD; No carotid bruits CARDIAC: Regular rate and rhythm, no murmurs, rubs, gallops RESPIRATORY:  Clear to auscultation without rales, wheezing or rhonchi  ABDOMEN: Soft, non-tender, non-distended EXTREMITIES:  No edema; No deformity    Wt Readings from Last 3 Encounters:  05/27/23  101.5 kg  04/23/23 99.6 kg  03/08/23 99.3 kg     EKG today demonstrates  SB, 1st degree AV block Vent. rate 53 BPM PR interval * ms QRS duration 102 ms QT/QTcB 426/399 ms   Echo 03/06/21 demonstrated   1. Left ventricular ejection fraction, by estimation, is 60 to 65%. The  left ventricle has normal function. The left ventricle has no regional  wall motion abnormalities. Left ventricular diastolic parameters are  indeterminate.   2. Right ventricular systolic function is normal. The right ventricular  size is normal. There is normal pulmonary artery systolic pressure.   3. The mitral valve is normal in structure. No evidence of mitral valve  regurgitation. No evidence of mitral stenosis.   4. The aortic valve was not well visualized. Aortic valve regurgitation  is not visualized.   5. The inferior vena cava is normal in size with greater than 50%  respiratory variability, suggesting right atrial pressure of 3 mmHg.   Comparison(s): A prior study was performed on 09/13/20. No significant  change from prior study.    CHA2DS2-VASc Score = 6  The patient's score is based upon: CHF History: 0 HTN History: 1 Diabetes History: 0 Stroke History: 2 Vascular Disease History: 1 (aortic atherosclerosis) Age Score: 1 Gender Score: 1       ASSESSMENT AND PLAN: Persistent Atrial Fibrillation (ICD10:  I48.19) The patient's CHA2DS2-VASc score is 6, indicating a 9.7% annual risk of stroke.   S/p afib ablation 09/05/21 Apple watch strips reviewed today, mostly SR with PACs but does have some brief episodes of afib. We discussed rhythm control options today. She did  not feel well on flecainide  100 mg BID. Can consider repeat ablation.  Continue flecainide  100 mg AM and 50 mg PM Decrease Toprol  to 12.5 mg daily given bradycardia and fatigue. Continue Eliquis  5 mg BID Apple Watch for home monitoring.   Secondary Hypercoagulable State (ICD10:  D68.69) The patient is at significant risk  for stroke/thromboembolism based upon her CHA2DS2-VASc Score of 6.  Continue Apixaban  (Eliquis ).   High Risk Medication Monitoring (ICD 10: Z79.899) Intervals on ECG appropriate for flecainide .   OSA  Patient reports intolerance of CPAP The importance of adequate treatment of sleep apnea was discussed today in order to improve our ability to maintain sinus rhythm long term. Will have her follow up with Dr Burnard to discuss options.   HTN Stable on current regimen   Follow up in the AF clinic in 3 months.    Daril Kicks PA-C [facility] [address] [place], [place] [address] [phone]

## 2023-05-31 NOTE — Telephone Encounter (Signed)
 Lm on vm for patient to return call - wanted to follow up and see if symptoms persisted, any fever?

## 2023-06-01 ENCOUNTER — Ambulatory Visit: Payer: Medicare PPO | Admitting: Gastroenterology

## 2023-06-01 ENCOUNTER — Encounter: Payer: Self-pay | Admitting: Gastroenterology

## 2023-06-01 VITALS — BP 137/58 | HR 87 | Temp 97.3°F | Resp 17 | Ht 67.0 in | Wt 219.0 lb

## 2023-06-01 DIAGNOSIS — Z860101 Personal history of adenomatous and serrated colon polyps: Secondary | ICD-10-CM | POA: Diagnosis not present

## 2023-06-01 DIAGNOSIS — G4730 Sleep apnea, unspecified: Secondary | ICD-10-CM | POA: Diagnosis not present

## 2023-06-01 DIAGNOSIS — F419 Anxiety disorder, unspecified: Secondary | ICD-10-CM | POA: Diagnosis not present

## 2023-06-01 DIAGNOSIS — K50019 Crohn's disease of small intestine with unspecified complications: Secondary | ICD-10-CM

## 2023-06-01 DIAGNOSIS — K5730 Diverticulosis of large intestine without perforation or abscess without bleeding: Secondary | ICD-10-CM

## 2023-06-01 DIAGNOSIS — I10 Essential (primary) hypertension: Secondary | ICD-10-CM | POA: Diagnosis not present

## 2023-06-01 DIAGNOSIS — Z1211 Encounter for screening for malignant neoplasm of colon: Secondary | ICD-10-CM | POA: Diagnosis not present

## 2023-06-01 DIAGNOSIS — K640 First degree hemorrhoids: Secondary | ICD-10-CM

## 2023-06-01 DIAGNOSIS — K633 Ulcer of intestine: Secondary | ICD-10-CM | POA: Diagnosis not present

## 2023-06-01 DIAGNOSIS — E669 Obesity, unspecified: Secondary | ICD-10-CM | POA: Diagnosis not present

## 2023-06-01 MED ORDER — SODIUM CHLORIDE 0.9 % IV SOLN: 500.0000 mL | INTRAVENOUS | Status: DC

## 2023-06-01 NOTE — Op Note (Signed)
 [facility] Patient Name: Glenda [name] Procedure Date: 06/01/2023 8:55 AM MRN: [id number] Endoscopist: Glendia E. Stacia , MD, [id number] Age: 73 Referring MD:  Date of Birth: March 31, 1951 Gender: Female Account #: [id number] Procedure:                Colonoscopy Indications:              High risk colon cancer surveillance: Personal                            history of multiple (3 or more) adenomas, Last                            colonoscopy: October 2021 Medicines:                Monitored Anesthesia Care Procedure:                Pre-Anesthesia Assessment:                           - Prior to the procedure, a History and Physical                            was performed, and patient medications and                            allergies were reviewed. The patient's tolerance of                            previous anesthesia was also reviewed. The risks                            and benefits of the procedure and the sedation                            options and risks were discussed with the patient.                            All questions were answered, and informed consent                            was obtained. Prior Anticoagulants: The patient has                            taken Eliquis  (apixaban ), last dose was 2 days                            prior to procedure. ASA Grade Assessment: III - A                            patient with severe systemic disease. After                            reviewing the risks and benefits, the patient was  deemed in satisfactory condition to undergo the                            procedure.                           After obtaining informed consent, the colonoscope                            was passed under direct vision. Throughout the                            procedure, the patient's blood pressure, pulse, and                            oxygen saturations were monitored continuously. The                             CF [id number] #[id number] was introduced through the anus                            and advanced to the the terminal ileum, with                            identification of the appendiceal orifice and IC                            valve. The colonoscopy was performed without                            difficulty. The patient tolerated the procedure                            well. The quality of the bowel preparation was                            good. The terminal ileum, ileocecal valve,                            appendiceal orifice, and rectum were photographed.                            The bowel preparation used was Miralax and Plenvu                             via extended prep with split dose instruction. Scope In: 9:07:10 AM Scope Out: 9:20:12 AM Scope Withdrawal Time: 0 hours 8 minutes 51 seconds  Total Procedure Duration: 0 hours 13 minutes 2 seconds  Findings:                 The perianal and digital rectal examinations were                            normal. Pertinent negatives include normal  sphincter tone and no palpable rectal lesions.                           Multiple medium-mouthed and small-mouthed                            diverticula were found in the sigmoid colon.                           The exam was otherwise normal throughout the                            examined colon.                           The terminal ileum contained a few ulcers. Biopsies                            were taken with a cold forceps for histology.                            Estimated blood loss was minimal.                           The remainder of the exam in the terminal ileum was                            normal.                           Non-bleeding internal hemorrhoids were found during                            retroflexion. The hemorrhoids were Grade I                            (internal hemorrhoids that do not prolapse).                            No additional abnormalities were found on                            retroflexion. Complications:            No immediate complications. Estimated Blood Loss:     Estimated blood loss was minimal. Impression:               - Mild diverticulosis in the sigmoid colon.                           - A few ulcers in the terminal ileum. Biopsied.                            Suspect this is NSAID-induced enteropathy.                           -  Non-bleeding internal hemorrhoids. Recommendation:           - Patient has a contact number available for                            emergencies. The signs and symptoms of potential                            delayed complications were discussed with the                            patient. Return to normal activities tomorrow.                            Written discharge instructions were provided to the                            patient.                           - Resume previous diet.                           - Continue present medications.                           - Await pathology results.                           - Consider repeat colonoscopy in 5 years for                            surveillance.                           - Recommend office visit in 5 years to discuss                            risks/benefits of ongoing colon cancer screening                            after age 50.                           - Resume Eliquis  (apixaban ) at prior dose tomorrow. Aurthur Wingerter E. Stacia, MD 06/01/2023 9:30:45 AM This report has been signed electronically.

## 2023-06-01 NOTE — Progress Notes (Signed)
 Called to room to assist during endoscopic procedure.  Patient ID and intended procedure confirmed with present staff. Received instructions for my participation in the procedure from the performing physician.

## 2023-06-01 NOTE — Patient Instructions (Signed)
 Resume previous diet and medications. Awaiting pathology results. Consider repeat Colonoscopy in 5 years for screening purposes. Resume Eliquis  at previous dose tomorrow.   Handout provided on Diverticulosis and Hemorrhoids.   YOU HAD AN ENDOSCOPIC PROCEDURE TODAY AT THE [facility]:   Refer to the procedure report that was given to you for any specific questions about what was found during the examination.  If the procedure report does not answer your questions, please call your gastroenterologist to clarify.  If you requested that your care partner not be given the details of your procedure findings, then the procedure report has been included in a sealed envelope for you to review at your convenience later.  YOU SHOULD EXPECT: Some feelings of bloating in the abdomen. Passage of more gas than usual.  Walking can help get rid of the air that was put into your GI tract during the procedure and reduce the bloating. If you had a lower endoscopy (such as a colonoscopy or flexible sigmoidoscopy) you may notice spotting of blood in your stool or on the toilet paper. If you underwent a bowel prep for your procedure, you may not have a normal bowel movement for a few days.  Please Note:  You might notice some irritation and congestion in your nose or some drainage.  This is from the oxygen used during your procedure.  There is no need for concern and it should clear up in a day or so.  SYMPTOMS TO REPORT IMMEDIATELY:  Following lower endoscopy (colonoscopy or flexible sigmoidoscopy):  Excessive amounts of blood in the stool  Significant tenderness or worsening of abdominal pains  Swelling of the abdomen that is new, acute  Fever of 100F or higher  For urgent or emergent issues, a gastroenterologist can be reached at any hour by calling [phone]. Do not use MyChart messaging for urgent concerns.    DIET:  We do recommend a small meal at first, but then you may proceed to your  regular diet.  Drink plenty of fluids but you should avoid alcoholic beverages for 24 hours.  ACTIVITY:  You should plan to take it easy for the rest of today and you should NOT DRIVE or use heavy machinery until tomorrow (because of the sedation medicines used during the test).    FOLLOW UP: Our staff will call the number listed on your records the next business day following your procedure.  We will call around 7:15- 8:00 am to check on you and address any questions or concerns that you may have regarding the information given to you following your procedure. If we do not reach you, we will leave a message.     If any biopsies were taken you will be contacted by phone or by letter within the next 1-3 weeks.  Please call us  at [phone] if you have not heard about the biopsies in 3 weeks.    SIGNATURES/CONFIDENTIALITY: You and/or your care partner have signed paperwork which will be entered into your electronic medical record.  These signatures attest to the fact that that the information above on your After Visit Summary has been reviewed and is understood.  Full responsibility of the confidentiality of this discharge information lies with you and/or your care-partner.

## 2023-06-01 NOTE — Progress Notes (Signed)
  Gastroenterology History and Physical   Primary Care Physician:  Jordan, Betty G, MD   Reason for Procedure:   Colon cancer screening/history of polyps  Plan:    Colonoscopy     HPI: Glenda [name] is a 73 y.o. female undergoing surveillance colonoscopy.  She has no family history of colon cancer and no chronic GI symptoms. Her last colonoscopy was in Oct 2021 and three 7-8 mm adenomas were removed.  She takes Eliquis  for a-fib, last dose was 1/12.   Past Medical History:  Diagnosis Date   Allergic rhinitis    Allergy    Anxiety    Arthritis    Cataract    Heart murmur    History of colonic polyps    History of PSVT (paroxysmal supraventricular tachycardia)    Hypertension    Impaired glucose tolerance    Obesity    PAT (paroxysmal atrial tachycardia) (HCC)    Pre-diabetes    hx, not currently being monitor for diabetes, diet controlled   Sleep apnea    Stroke (HCC) 08/2020   Scan revealed stroke in history   Tobacco user    Vertigo    started 3 weeks ago- after fluid trapped in ear    Past Surgical History:  Procedure Laterality Date   ankle sx-left fx     ANTERIOR CERVICAL DECOMP/DISCECTOMY FUSION N/A 10/28/2020   Procedure: Anterior Cervical Decompression Fusion - Cervical Four-Cervical Five - Cervical Five-Cervical Six - Cervical Six- Cervical Seven;  Surgeon: Onetha Kuba, MD;  Location: [facility] OR;  Service: Neurosurgery;  Laterality: N/A;  Anterior Cervical Decompression Fusion - Cervical Four-Cervical Five - Cervical Five-Cervical Six - Cervical Six- Cervical Seven   ATRIAL FIBRILLATION ABLATION N/A 09/05/2021   Procedure: ATRIAL FIBRILLATION ABLATION;  Surgeon: Inocencio Soyla Lunger, MD;  Location: [facility] INVASIVE CV LAB;  Service: Cardiovascular;  Laterality: N/A;   CARDIOVERSION N/A 11/28/2020   Procedure: CARDIOVERSION;  Surgeon: Delford Maude JAYSON, MD;  Location: [facility] ENDOSCOPY;  Service: Cardiovascular;  Laterality: N/A;   CARDIOVERSION N/A 04/15/2021   Procedure:  CARDIOVERSION;  Surgeon: Mona Vinie JAYSON, MD;  Location: [facility] ENDOSCOPY;  Service: Cardiovascular;  Laterality: N/A;   CATARACT EXTRACTION     both eyes   CHOLECYSTECTOMY N/A 01/11/2018   Procedure: LAPAROSCOPIC CHOLECYSTECTOMY WITH INTRAOPERATIVE CHOLANGIOGRAM ERAS PATHWAY;  Surgeon: Ethyl Lenis, MD;  Location: [facility] OR;  Service: General;  Laterality: N/A;   COLONOSCOPY  04/23/2005   DIAGNOSTIC LAPAROSCOPY     EYE SURGERY     foot sx     with the ankle surgery   POLYPECTOMY      Prior to Admission medications   Medication Sig Start Date End Date Taking? Authorizing Provider  acetaminophen  (TYLENOL ) 500 MG tablet Take 1,000 mg by mouth 2 (two) times daily as needed for moderate pain (pain score 4-6).   Yes [provider]  flecainide  (TAMBOCOR ) 100 MG tablet Take 1 tablet in the AM and 1/2 tablet in the PM 05/27/23  Yes Fenton, Clint R, PA  hydrochlorothiazide  (MICROZIDE ) 12.5 MG capsule Take 1 capsule (12.5 mg total) by mouth daily. 04/23/23 07/22/23 Yes Vannie Reche RAMAN, NP  metoprolol  succinate (TOPROL  XL) 25 MG 24 hr tablet Take 0.5 tablets (12.5 mg total) by mouth at bedtime. 05/27/23  Yes Fenton, Clint R, PA  PARoxetine  (PAXIL ) 40 MG tablet TAKE 1 TABLET BY MOUTH EVERY DAY 03/10/23  Yes Jordan, Betty G, MD  rosuvastatin  (CRESTOR ) 20 MG tablet Take 1 tablet (20 mg total) by mouth  daily. 04/26/23  Yes Walker, Caitlin S, NP  ALPRAZolam  (XANAX ) 0.25 MG tablet Take 1 tablet (0.25 mg total) by mouth daily as needed. for anxiety 03/10/22   Jordan, Betty G, MD  apixaban  (ELIQUIS ) 5 MG TABS tablet TAKE 1 TABLET BY MOUTH TWICE A DAY 06/15/22   Carroll, Donna C, NP  diclofenac  Sodium (VOLTAREN ) 1 % GEL Apply 4 g topically 4 (four) times daily. Patient not taking: Reported on 06/01/2023 02/18/22   Brooks, Dana, DO  diphenhydramine -acetaminophen  (TYLENOL  PM) 25-500 MG TABS tablet Take 1 tablet by mouth at bedtime as needed (sleep).    [provider]  metoprolol  tartrate (LOPRESSOR ) 25 MG  tablet Take 1 tablet (25 mg total) by mouth 3 (three) times daily. 10/29/20 10/29/20  Onetha Kuba, MD    Current Outpatient Medications  Medication Sig Dispense Refill   acetaminophen  (TYLENOL ) 500 MG tablet Take 1,000 mg by mouth 2 (two) times daily as needed for moderate pain (pain score 4-6).     flecainide  (TAMBOCOR ) 100 MG tablet Take 1 tablet in the AM and 1/2 tablet in the PM 180 tablet 2   hydrochlorothiazide  (MICROZIDE ) 12.5 MG capsule Take 1 capsule (12.5 mg total) by mouth daily. 90 capsule 3   metoprolol  succinate (TOPROL  XL) 25 MG 24 hr tablet Take 0.5 tablets (12.5 mg total) by mouth at bedtime. 45 tablet 2   PARoxetine  (PAXIL ) 40 MG tablet TAKE 1 TABLET BY MOUTH EVERY DAY 90 tablet 0   rosuvastatin  (CRESTOR ) 20 MG tablet Take 1 tablet (20 mg total) by mouth daily. 90 tablet 1   ALPRAZolam  (XANAX ) 0.25 MG tablet Take 1 tablet (0.25 mg total) by mouth daily as needed. for anxiety 30 tablet 1   apixaban  (ELIQUIS ) 5 MG TABS tablet TAKE 1 TABLET BY MOUTH TWICE A DAY 180 tablet 3   diclofenac  Sodium (VOLTAREN ) 1 % GEL Apply 4 g topically 4 (four) times daily. (Patient not taking: Reported on 06/01/2023) 30 g 0   diphenhydramine -acetaminophen  (TYLENOL  PM) 25-500 MG TABS tablet Take 1 tablet by mouth at bedtime as needed (sleep).     Current Facility-Administered Medications  Medication Dose Route Frequency Provider Last Rate Last Admin   0.9 %  sodium chloride  infusion  500 mL Intravenous Continuous Stacia Glendia BRAVO, MD        Allergies as of 06/01/2023 - Review Complete 06/01/2023  Allergen Reaction Noted   Amoxicillin Rash and Other (See Comments)    Erythromycin Other (See Comments)     Family History  Problem Relation Age of Onset   Healthy Mother    Dementia Father    Healthy Sister    Healthy Sister    Alzheimer's disease Other    Colon cancer Neg Hx    Esophageal cancer Neg Hx    Rectal cancer Neg Hx    Stomach cancer Neg Hx    Colon polyps Neg Hx     Social  History   Socioeconomic History   Marital status: Married    Spouse name: Not on file   Number of children: 2   Years of education: college   Highest education level: Not on file  Occupational History   Occupation: Retired  Tobacco Use   Smoking status: Former    Current packs/day: 0.00    Average packs/day: 0.3 packs/day for 50.0 years (12.5 ttl pk-yrs)    Types: Cigarettes    Start date: 10/18/1970    Quit date: 10/17/2020    Years since quitting: 2.6  Smokeless tobacco: Never   Tobacco comments:    Former smoker 06/10/22  Vaping Use   Vaping status: Never Used  Substance and Sexual Activity   Alcohol use: Yes    Alcohol/week: 2.0 standard drinks of alcohol    Types: 2 Standard drinks or equivalent per week    Comment: occassionally   Drug use: No   Sexual activity: Yes  Other Topics Concern   Not on file  Social History Narrative   Lives at home with her husband.   Right-handed.   One cup caffeine per day.   Social Drivers of Corporate Investment Banker Strain: Low Risk  (02/19/2022)   Overall Financial Resource Strain (CARDIA)    Difficulty of Paying Living Expenses: Not hard at all  Food Insecurity: No Food Insecurity (03/13/2022)   Hunger Vital Sign    Worried About Running Out of Food in the Last Year: Never true    Ran Out of Food in the Last Year: Never true  Transportation Needs: No Transportation Needs (03/13/2022)   PRAPARE - Administrator, Civil Service (Medical): No    Lack of Transportation (Non-Medical): No  Physical Activity: Inactive (02/19/2022)   Exercise Vital Sign    Days of Exercise per Week: 0 days    Minutes of Exercise per Session: 0 min  Stress: No Stress Concern Present (02/19/2022)   [employer] of Occupational Health - Occupational Stress Questionnaire    Feeling of Stress : Not at all  Social Connections: Moderately Isolated (02/19/2022)   Social Connection and Isolation Panel [NHANES]    Frequency of Communication  with Friends and Family: More than three times a week    Frequency of Social Gatherings with Friends and Family: More than three times a week    Attends Religious Services: Never    Database Administrator or Organizations: No    Attends Banker Meetings: Never    Marital Status: Married  Catering Manager Violence: Not At Risk (02/19/2022)   Humiliation, Afraid, Rape, and Kick questionnaire    Fear of Current or Ex-Partner: No    Emotionally Abused: No    Physically Abused: No    Sexually Abused: No    Review of Systems:  All other review of systems negative except as mentioned in the HPI.  Physical Exam: Vital signs BP (!) 150/79   Pulse 84   Temp (!) 97.3 F (36.3 C) (Temporal)   Ht 5' 7 (1.702 m)   Wt 219 lb (99.3 kg)   SpO2 96%   BMI 34.30 kg/m   General:   Alert,  Well-developed, well-nourished, pleasant and cooperative in NAD Airway:  Mallampati 2 Lungs:  Clear throughout to auscultation.   Heart:  Regular rate and rhythm; no murmurs, clicks, rubs,  or gallops. Abdomen:  Soft, nontender and nondistended. Normal bowel sounds.   Neuro/Psych:  Normal mood and affect. A and O x 3   Stellah Donovan E. Stacia, MD [facility] Gastroenterology

## 2023-06-01 NOTE — Progress Notes (Signed)
 A/O x 3, gd SR's, VSS, report to RN

## 2023-06-02 ENCOUNTER — Telehealth: Payer: Self-pay | Admitting: *Deleted

## 2023-06-02 NOTE — Telephone Encounter (Signed)
  Follow up Call-     06/01/2023    8:39 AM  Call back number  Post procedure Call Back phone  # [phone]  Permission to leave phone message Yes     Patient questions:  Do you have a fever, pain , or abdominal swelling? No. Pain Score  0 *  Have you tolerated food without any problems? Yes.    Have you been able to return to your normal activities? Yes.    Do you have any questions about your discharge instructions: Diet   No. Medications  No. Follow up visit  No.  Do you have questions or concerns about your Care? No.  Actions: * If pain score is 4 or above: No action needed, pain <4.

## 2023-06-03 LAB — SURGICAL PATHOLOGY

## 2023-06-09 ENCOUNTER — Ambulatory Visit
Admission: RE | Admit: 2023-06-09 | Discharge: 2023-06-09 | Disposition: A | Discharge status: Home / Self Care | Payer: Medicare PPO | Source: Ambulatory Visit | Attending: Family Medicine | Admitting: Family Medicine

## 2023-06-09 ENCOUNTER — Encounter: Payer: Self-pay | Admitting: Gastroenterology

## 2023-06-09 ENCOUNTER — Other Ambulatory Visit: Payer: Self-pay | Admitting: Family Medicine

## 2023-06-09 DIAGNOSIS — M7989 Other specified soft tissue disorders: Secondary | ICD-10-CM

## 2023-06-09 DIAGNOSIS — R928 Other abnormal and inconclusive findings on diagnostic imaging of breast: Secondary | ICD-10-CM

## 2023-06-09 DIAGNOSIS — N641 Fat necrosis of breast: Secondary | ICD-10-CM | POA: Diagnosis not present

## 2023-06-09 NOTE — Progress Notes (Signed)
 Glenda [name], The biopsies of the small ulcer showed nonspecific reactive changes, but no evidence of Crohn's disease.  As discussed, I do not feel like any further evaluation needs to be done for these incidental findings. Please make an office visit with me in 5 years if you would like to discuss further colon cancer screening at that time.

## 2023-06-14 ENCOUNTER — Telehealth: Payer: Self-pay | Admitting: Cardiovascular Disease

## 2023-06-14 ENCOUNTER — Encounter: Payer: Self-pay | Admitting: Cardiovascular Disease

## 2023-06-14 NOTE — Telephone Encounter (Signed)
-----   Message from Nurse Stacy C sent at 05/27/2023 11:02 AM EST ----- Regarding: appt w dr Tresa Endo Pt needs appt with dr Tresa Endo regarding sleep apnea management thanks stacy

## 2023-06-14 NOTE — Telephone Encounter (Signed)
LVM 3x with no success in scheduling, will send letter to patient.

## 2023-07-11 ENCOUNTER — Other Ambulatory Visit (HOSPITAL_COMMUNITY): Payer: Self-pay | Admitting: Physician Assistant

## 2023-07-21 ENCOUNTER — Other Ambulatory Visit (HOSPITAL_COMMUNITY): Payer: Self-pay | Admitting: Physician Assistant

## 2023-08-11 ENCOUNTER — Telehealth: Admitting: Family Medicine

## 2023-08-11 ENCOUNTER — Encounter: Payer: Self-pay | Admitting: Family Medicine

## 2023-08-11 DIAGNOSIS — R5383 Other fatigue: Secondary | ICD-10-CM | POA: Diagnosis not present

## 2023-08-11 DIAGNOSIS — R051 Acute cough: Secondary | ICD-10-CM | POA: Diagnosis not present

## 2023-08-11 DIAGNOSIS — R0981 Nasal congestion: Secondary | ICD-10-CM | POA: Diagnosis not present

## 2023-08-11 DIAGNOSIS — U071 COVID-19: Secondary | ICD-10-CM

## 2023-08-11 MED ORDER — BENZONATATE 100 MG PO CAPS: 100.0000 mg | ORAL_CAPSULE | Freq: Two times a day (BID) | ORAL | 0 refills | Status: DC | PRN

## 2023-08-11 NOTE — Progress Notes (Signed)
 Virtual Visit via Video Note  I connected with Glenda [name] on 08/11/23 at  4:30 PM EDT by a video enabled telemedicine application and verified that I am speaking with the correct person using two identifiers.  Location patient: home Location provider:work or home office Persons participating in the virtual visit: patient, provider  I discussed the limitations of evaluation and management by telemedicine and the availability of in person appointments. The patient expressed understanding and agreed to proceed. Chief Complaint  Patient presents with   Covid Positive    Today    Nasal Congestion    Patient complains of nasal congestion, x1 day    Headache    Patient complains of headaches, x1 day, Tried Tylenol   Fatigue    Patient complains of fatigue, x1 day    Cough    Patient complains of cough, x1 day, tylenol     HPI: Pt is a 73 yo female with history of A-fib on anti-coagulation therapy, seasonal allergies, anxiety, HLD, CVA who is followed by Dr. [place] and seen for acute concern.  Pt tested positive for COVID this am.  Symptoms started yesterday with sneezing, congestion, cough, mild ear pressure, scratchy throat, fatigue, HA.  Pt also endorses a low grade temp, 32F.  Appetite is fair.  Denies n/v, SOB, or sick contacts.  Tried tylenol for symptoms.  Did not know what else she could take OTC given history of A-fib.  Patient endorses having COVID in 2023.  Thinks had an antiviral, but did not like it.  ROS: See pertinent positives and negatives per HPI.  Past Medical History:  Diagnosis Date   Allergic rhinitis    Allergy    Anxiety    Arthritis    Cataract    Heart murmur    History of colonic polyps    History of PSVT (paroxysmal supraventricular tachycardia)    Hypertension    Impaired glucose tolerance    Obesity    PAT (paroxysmal atrial tachycardia) (HCC)    Pre-diabetes    hx, not currently being monitor for diabetes, diet controlled   Sleep apnea    Stroke  (HCC) 08/2020   Scan revealed stroke in history   Tobacco user    Vertigo    started 3 weeks ago- after fluid trapped in ear    Past Surgical History:  Procedure Laterality Date   ankle sx-left fx     ANTERIOR CERVICAL DECOMP/DISCECTOMY FUSION N/A 10/28/2020   Procedure: Anterior Cervical Decompression Fusion - Cervical Four-Cervical Five - Cervical Five-Cervical Six - Cervical Six- Cervical Seven;  Surgeon: Donalee Citrin, MD;  Location: [facility] OR;  Service: Neurosurgery;  Laterality: N/A;  Anterior Cervical Decompression Fusion - Cervical Four-Cervical Five - Cervical Five-Cervical Six - Cervical Six- Cervical Seven   ATRIAL FIBRILLATION ABLATION N/A 09/05/2021   Procedure: ATRIAL FIBRILLATION ABLATION;  Surgeon: Regan Lemming, MD;  Location: [facility] INVASIVE CV LAB;  Service: Cardiovascular;  Laterality: N/A;   CARDIOVERSION N/A 11/28/2020   Procedure: CARDIOVERSION;  Surgeon: Wendall Stade, MD;  Location: [facility] ENDOSCOPY;  Service: Cardiovascular;  Laterality: N/A;   CARDIOVERSION N/A 04/15/2021   Procedure: CARDIOVERSION;  Surgeon: Chrystie Nose, MD;  Location: [facility] ENDOSCOPY;  Service: Cardiovascular;  Laterality: N/A;   CATARACT EXTRACTION     both eyes   CHOLECYSTECTOMY N/A 01/11/2018   Procedure: LAPAROSCOPIC CHOLECYSTECTOMY WITH INTRAOPERATIVE CHOLANGIOGRAM ERAS PATHWAY;  Surgeon: Ovidio Kin, MD;  Location: [facility] OR;  Service: General;  Laterality: N/A;   COLONOSCOPY  04/23/2005  DIAGNOSTIC LAPAROSCOPY     EYE SURGERY     foot sx     with the ankle surgery   POLYPECTOMY      Family History  Problem Relation Age of Onset   Healthy Mother    Dementia Father    Healthy Sister    Healthy Sister    Alzheimer's disease Other    Colon cancer Neg Hx    Esophageal cancer Neg Hx    Rectal cancer Neg Hx    Stomach cancer Neg Hx    Colon polyps Neg Hx      Current Outpatient Medications:    acetaminophen (TYLENOL) 500 MG tablet, Take 1,000 mg by mouth 2 (two) times daily as needed  for moderate pain (pain score 4-6)., Disp: , Rfl:    ALPRAZolam (XANAX) 0.25 MG tablet, Take 1 tablet (0.25 mg total) by mouth daily as needed. for anxiety, Disp: 30 tablet, Rfl: 1   diclofenac Sodium (VOLTAREN) 1 % GEL, Apply 4 g topically 4 (four) times daily., Disp: 30 g, Rfl: 0   diphenhydramine-acetaminophen (TYLENOL PM) 25-500 MG TABS tablet, Take 1 tablet by mouth at bedtime as needed (sleep)., Disp: , Rfl:    ELIQUIS 5 MG TABS tablet, TAKE 1 TABLET BY MOUTH TWICE A DAY, Disp: 180 tablet, Rfl: 3   flecainide (TAMBOCOR) 100 MG tablet, Take 1 tablet in the AM and 1/2 tablet in the PM, Disp: 180 tablet, Rfl: 2   metoprolol succinate (TOPROL-XL) 25 MG 24 hr tablet, Take 0.5 tablets (12.5 mg total) by mouth at bedtime. (Patient taking differently: Take 25 mg by mouth at bedtime.), Disp: 45 tablet, Rfl: 2   PARoxetine (PAXIL) 40 MG tablet, TAKE 1 TABLET BY MOUTH EVERY DAY, Disp: 90 tablet, Rfl: 0   rosuvastatin (CRESTOR) 20 MG tablet, Take 1 tablet (20 mg total) by mouth daily., Disp: 90 tablet, Rfl: 1   hydrochlorothiazide (MICROZIDE) 12.5 MG capsule, Take 1 capsule (12.5 mg total) by mouth daily., Disp: 90 capsule, Rfl: 3  EXAM:  VITALS per patient if applicable: RR between 12-20 bpm.  GENERAL: alert, oriented, appears well and in no acute distress.  Nontoxic.  HEENT: atraumatic, conjunctiva clear, no obvious abnormalities on inspection of external nose and ears  NECK: normal movements of the head and neck  LUNGS: Dry cough.  On inspection no signs of respiratory distress, breathing rate appears normal, no obvious gross SOB, gasping or wheezing  CV: no obvious cyanosis  MS: moves all visible extremities without noticeable abnormality  PSYCH/NEURO: pleasant and cooperative, no obvious depression or anxiety, speech and thought processing grossly intact  ASSESSMENT AND PLAN:  Discussed the following assessment and plan:  COVID-19 virus infection - Plan: benzonatate (TESSALON) 100 MG  capsule  Acute cough  Nasal congestion  Other fatigue  Patient with acute viral URI symptoms 2/2 COVID-19 infection x 1 day.  Positive at-home COVID test on 08/10/2023 (Picture in chart).  Symptoms currently mild.  Discussed R/B/A of antiviral medications however likely cost prohibitive due to cessation of EUA.  Rx for Tessalon sent to pharmacy.  Supportive care with OTC medications encouraged.  Patient can use OTC cough/cold medications for people with high blood pressure including Coricidin HBP, Mucinex HBP, plain Robitussin.  Rest, warm fluids, cough drops, honey, saline nasal rinse, steam from shower also encouraged.  Patient given strict precautions for worsening symptoms.  Follow-up as needed.   I discussed the assessment and treatment plan with the patient. The patient was provided an opportunity  to ask questions and all were answered. The patient agreed with the plan and demonstrated an understanding of the instructions.   The patient was advised to call back or seek an in-person evaluation if the symptoms worsen or if the condition fails to improve as anticipated.   Deeann Saint, MD

## 2023-08-21 ENCOUNTER — Other Ambulatory Visit: Payer: Self-pay | Admitting: Family Medicine

## 2023-08-26 ENCOUNTER — Ambulatory Visit (HOSPITAL_COMMUNITY)
Admission: RE | Admit: 2023-08-26 | Discharge: 2023-08-26 | Disposition: A | Discharge status: Home / Self Care | Payer: Medicare PPO | Source: Ambulatory Visit | Attending: Physician Assistant | Admitting: Physician Assistant

## 2023-08-26 ENCOUNTER — Encounter (HOSPITAL_COMMUNITY): Payer: Self-pay | Admitting: Physician Assistant

## 2023-08-26 VITALS — BP 130/70 | HR 58 | Ht 67.0 in | Wt 221.4 lb

## 2023-08-26 DIAGNOSIS — G4733 Obstructive sleep apnea (adult) (pediatric): Secondary | ICD-10-CM | POA: Diagnosis not present

## 2023-08-26 DIAGNOSIS — Z7901 Long term (current) use of anticoagulants: Secondary | ICD-10-CM | POA: Diagnosis not present

## 2023-08-26 DIAGNOSIS — I10 Essential (primary) hypertension: Secondary | ICD-10-CM | POA: Insufficient documentation

## 2023-08-26 DIAGNOSIS — I4819 Other persistent atrial fibrillation: Secondary | ICD-10-CM | POA: Diagnosis not present

## 2023-08-26 DIAGNOSIS — Z5181 Encounter for therapeutic drug level monitoring: Secondary | ICD-10-CM | POA: Diagnosis not present

## 2023-08-26 DIAGNOSIS — Z79899 Other long term (current) drug therapy: Secondary | ICD-10-CM | POA: Insufficient documentation

## 2023-08-26 DIAGNOSIS — D6869 Other thrombophilia: Secondary | ICD-10-CM | POA: Insufficient documentation

## 2023-08-26 MED ORDER — METOPROLOL SUCCINATE ER 25 MG PO TB24: 25.0000 mg | ORAL_TABLET | Freq: Every day | ORAL | 1 refills | Status: DC

## 2023-08-26 NOTE — Progress Notes (Signed)
 Primary Care Physician: [place], Betty G, MD Primary Cardiologist: Jodelle Red, MD Electrophysiologist: Will Jorja Loa, MD  Referring Physician: [facility]   Glenda [name] is a 73 y.o. female with a history of CVA, HTN, OSA, aortic atherosclerosis, atrial fibrillation who presents for follow up in the [facility]. S/p afib ablation 09/05/21, maintained on flecainide. Patient is on Eliquis for stroke prevention.   Patient returns for follow up for atrial fibrillation and flecainide monitoring. She reports that she has done well from an afib standpoint. She did have some afib in the setting of a COVID infection. She is in SR today. No bleeding issues on anticoagulation.   Today, she  denies symptoms of palpitations, chest pain, shortness of breath, orthopnea, PND, lower extremity edema, dizziness, presyncope, syncope, bleeding, or neurologic sequela. The patient is tolerating medications without difficulties and is otherwise without complaint today.    Atrial Fibrillation Risk Factors:  she does have symptoms or diagnosis of sleep apnea. Sleep study 05/27/21 she does not have a history of rheumatic fever.   Atrial Fibrillation Management history:  Previous antiarrhythmic drugs: flecainide  Previous cardioversions: 11/28/20, 04/15/21 Previous ablations: 09/05/21 Anticoagulation history: Eliquis  ROS- All systems are reviewed and negative except as per the HPI above.   Physical Exam: BP 130/70   Pulse (!) 58   Ht 5\' 7"  (1.702 m)   Wt 100.4 kg   BMI 34.68 kg/m   GEN: Well nourished, well developed in no acute distress CARDIAC: Regular rate and rhythm, no murmurs, rubs, gallops RESPIRATORY:  Clear to auscultation without rales, wheezing or rhonchi  ABDOMEN: Soft, non-tender, non-distended EXTREMITIES:  No edema; No deformity    Wt Readings from Last 3 Encounters:  08/26/23 100.4 kg  06/01/23 99.3 kg  05/27/23 101.5 kg     EKG  today demonstrates  SR low voltage P waves Vent. rate 58 BPM PR interval * ms QRS duration 110 ms QT/QTcB 436/428 ms   Echo 03/06/21 demonstrated   1. Left ventricular ejection fraction, by estimation, is 60 to 65%. The  left ventricle has normal function. The left ventricle has no regional  wall motion abnormalities. Left ventricular diastolic parameters are  indeterminate.   2. Right ventricular systolic function is normal. The right ventricular  size is normal. There is normal pulmonary artery systolic pressure.   3. The mitral valve is normal in structure. No evidence of mitral valve  regurgitation. No evidence of mitral stenosis.   4. The aortic valve was not well visualized. Aortic valve regurgitation  is not visualized.   5. The inferior vena cava is normal in size with greater than 50%  respiratory variability, suggesting right atrial pressure of 3 mmHg.   Comparison(s): A prior study was performed on 09/13/20. No significant  change from prior study.    CHA2DS2-VASc Score = 6  The patient's score is based upon: CHF History: 0 HTN History: 1 Diabetes History: 0 Stroke History: 2 Vascular Disease History: 1 (aortic atherosclerosis) Age Score: 1 Gender Score: 1       ASSESSMENT AND PLAN: Persistent Atrial Fibrillation (ICD10:  I48.19) The patient's CHA2DS2-VASc score is 6, indicating a 9.7% annual risk of stroke.   S/p afib ablation 09/05/21 Patient appears to be maintaining SR Continue flecainide 100 mg AM and 50 mg PM, recall she did not feel well on higher dosing.  Continue Toprol 25 mg daily Continue Eliquis 5 mg BID Apple Watch for home monitoring.  Secondary Hypercoagulable State (ICD10:  D68.69) The patient is at significant risk for stroke/thromboembolism based upon her CHA2DS2-VASc Score of 6.  Continue Apixaban (Eliquis). No bleeding issues.   High Risk Medication Monitoring (ICD 10: Z79.899) Intervals on ECG acceptable for flecainide monitoring.       OSA  Patient reports intolerance of CPAP The importance of adequate treatment of sleep apnea was discussed today in order to improve our ability to maintain sinus rhythm long term. We also discussed complications of untreated OSA. She does not think she has OSA anymore. Offered repeat sleep study. She declines for now.   HTN Stable on current regimen   Follow up in the AF clinic in 6 months.    Jorja Loa PA-C [facility] [address] [place], [place] [address] [phone]

## 2023-09-13 ENCOUNTER — Ambulatory Visit
Admission: RE | Admit: 2023-09-13 | Discharge: 2023-09-13 | Disposition: A | Discharge status: Home / Self Care | Payer: Medicare PPO | Source: Ambulatory Visit | Attending: Family Medicine | Admitting: Family Medicine

## 2023-09-13 DIAGNOSIS — M7989 Other specified soft tissue disorders: Secondary | ICD-10-CM

## 2023-09-13 DIAGNOSIS — Z09 Encounter for follow-up examination after completed treatment for conditions other than malignant neoplasm: Secondary | ICD-10-CM | POA: Diagnosis not present

## 2023-09-13 DIAGNOSIS — N641 Fat necrosis of breast: Secondary | ICD-10-CM | POA: Diagnosis not present

## 2023-09-23 ENCOUNTER — Other Ambulatory Visit: Payer: Self-pay | Admitting: Family Medicine

## 2023-09-24 MED ORDER — PAROXETINE HCL 40 MG PO TABS: 40.0000 mg | ORAL_TABLET | Freq: Every day | ORAL | 1 refills | Status: DC

## 2023-09-24 NOTE — Addendum Note (Signed)
 Addended by: Doll French on: 09/24/2023 08:51 AM   Modules accepted: Orders

## 2023-11-14 ENCOUNTER — Other Ambulatory Visit: Payer: Self-pay | Admitting: Cardiovascular Disease

## 2023-11-14 ENCOUNTER — Other Ambulatory Visit (HOSPITAL_COMMUNITY): Payer: Self-pay | Admitting: Physician Assistant

## 2024-01-19 ENCOUNTER — Encounter: Payer: Self-pay | Admitting: Family Medicine

## 2024-02-08 ENCOUNTER — Other Ambulatory Visit: Payer: Self-pay

## 2024-02-08 ENCOUNTER — Ambulatory Visit: Admitting: Sports Medicine

## 2024-02-08 ENCOUNTER — Encounter: Payer: Self-pay | Admitting: Sports Medicine

## 2024-02-08 DIAGNOSIS — M79661 Pain in right lower leg: Secondary | ICD-10-CM

## 2024-02-08 DIAGNOSIS — S86111A Strain of other muscle(s) and tendon(s) of posterior muscle group at lower leg level, right leg, initial encounter: Secondary | ICD-10-CM

## 2024-02-08 NOTE — Progress Notes (Signed)
 Patient says that she has been doing fitness classes for balance since March, where they do a lot of calf raises. She says that she has had soreness in the right calf for about 2-3 weeks. 8 days ago, she missed a step when going down the stairs and landed hard. When she landed, she felt a pop and felt as though someone kicked her in the middle of her calf. She has had swelling and bruising in the leg, ankle, and foot. She says that her pain has subsided, but she does feel tightness, especially with the swelling. She iced her leg right after it happened, and has elevated it when she has been able.

## 2024-02-08 NOTE — Progress Notes (Addendum)
 Glenda [name] - 73 y.o. female MRN [id number]  Date of birth: 03/22/1951  Office Visit Note: Visit Date: 02/08/2024 PCP: [place], Betty G, MD Referred by: [place], Betty G, MD  Subjective: Chief Complaint  Patient presents with   Right Leg - Pain   HPI: Glenda [name] is a pleasant 73 y.o. female who presents today for acute right calf injury x 1 week.  Kryslyn had had some issues and some pains in the calf before with certain activity, but this would come and go.  She has been doing a lot of calf raises for fitness class since March of this year.  This previous Monday, she was going down the steps where she missed a step and then landed hard on the right leg and felt a pop and sharp pain in the whole leg, describes as if someone kicked her.   Pertinent ROS were reviewed with the patient and found to be negative unless otherwise specified above in HPI.   Assessment & Plan: Visit Diagnoses:  1. Gastrocnemius tear, right, initial encounter   2. Right calf pain    Plan: Impression is right medial head of gastrocnemius tear that spans about 50-55% of the muscle diameter.  Injury was just over 1 week ago and does have notable soft tissue swelling, ecchymosis and ultrasound confirmed tear with hematoma.  To help with hematoma breakdown and absorption as well as muscle tear recovery, we did proceed with a low-dose of extracorporeal shockwave therapy, patient tolerated well.  Recommended Compression sleeve.  I did fit her for heel lifts to place into her shoes to help offload the Achilles.  Activity modifications discussed.  I will see her back in 2 weeks to repeat evaluation, may consider additional shockwave treatment at that time.  Recommended ankle pumps, we will hold from PT but will likely get her started in some HEP at our follow-up if she is doing better.  Follow-up: Return in about 2 weeks (around 02/22/2024) for R-calf f/u .   Meds & Orders: No orders of the defined types were placed in this  encounter.   Orders Placed This Encounter  Procedures   US  Extrem Low Right Ltd     Procedures: Procedure: ECSWT Indications:  Gastrocnemius tear   Procedure Details Consent: Risks of procedure as well as the alternatives and risks of each were explained to the patient.  Verbal consent for procedure obtained. Time Out: Verified patient identification, verified procedure, site was marked, verified correct patient position. The area was cleaned with alcohol swab.     The right MHG/Calf was targeted for Extracorporeal shockwave therapy.    Preset: Status post muscular injury Power Level: 80-90 mJ Frequency: 8-10 Hz Impulse/cycles: 2000 Head size: Regular   Patient tolerated procedure well without immediate complications.         Clinical History: No specialty comments available.  She reports that she quit smoking about 3 years ago. Her smoking use included cigarettes. She started smoking about 53 years ago. She has a 12.5 pack-year smoking history. She has never used smokeless tobacco.  Recent Labs    04/23/23 0950  HGBA1C 6.3*    Objective:    Physical Exam  Gen: Well-appearing, in no acute distress; non-toxic CV: Well-perfused. Warm.  Resp: Breathing unlabored on room air; no wheezing. Psych: Fluid speech in conversation; appropriate affect; normal thought process  Ortho Exam - Right calf/leg: There is tenderness to palpation of the medial head of the Gaster anemias more so on  the proximal to mid belly.  There is no tenderness palpating along the Achilles from the proximal to distal insertion.  There is an intact Thompson squeeze test.  There is notable soft tissue swelling from the level of the posterior knee down the calf.  There is notable ecchymosis over the posterior leg.  There is intact active dorsiflexion and plantarflexion.  Imaging: US  Extrem Low Right Ltd Result Date: 02/08/2024 Limited musculoskeletal ultrasound of the right lower extremity, right calf and  leg was performed today.  Ultrasound demonstrates partial tearing of the medial head of the gastrocnemius and the proximal to mid aspect, the distal myotendinous junction is intact.  No Achilles abnormality.  The tendon tear was evaluated in both short and long axis, it is about 10 cm in long axis, the tear spans about 50-55% of the MHG width but no full-thickness tearing.  There is hypoechoic fluid indicative of large intramuscular hematoma, this was nonfluctuant on sono palpation today.  The lateral head of the gastrocnemius muscle is intact.  No abnormality of the popliteal fossa or distal hamstring tendon tearing.       Past Medical/Family/Surgical/Social History: Medications & Allergies reviewed per EMR, new medications updated. Patient Active Problem List   Diagnosis Date Noted   OSA (obstructive sleep apnea) 08/26/2023   Encounter for monitoring flecainide  therapy 05/27/2023   Hypercoagulable state due to persistent atrial fibrillation (HCC) 06/10/2022   Persistent atrial fibrillation (HCC)    HNP (herniated nucleus pulposus) with myelopathy, cervical 10/28/2020   Paresthesia 09/26/2020   Cerebrovascular accident (CVA) (HCC) 09/02/2020   Weakness 08/29/2020   Right cervical radiculopathy 08/29/2020   Hyperlipidemia 11/24/2019   Anxiety disorder, unspecified 11/25/2007   ALLERGIC RHINITIS 11/25/2007   History of colonic polyps 11/25/2007   Class 1 obesity with body mass index (BMI) of 32.0 to 32.9 in adult 11/24/2006   PANIC DISORDER 11/24/2006   Essential hypertension 11/24/2006   PAROXYSMAL SUPRAVENTRICULAR TACHYCARDIA 11/24/2006   Past Medical History:  Diagnosis Date   Allergic rhinitis    Allergy    Anxiety    Arthritis    Cataract    Heart murmur    History of colonic polyps    History of PSVT (paroxysmal supraventricular tachycardia)    Hypertension    Impaired glucose tolerance    Obesity    PAT (paroxysmal atrial tachycardia)    Pre-diabetes    hx, not  currently being monitor for diabetes, diet controlled   Sleep apnea    Stroke (HCC) 08/2020   Scan revealed stroke in history   Tobacco user    Vertigo    started 3 weeks ago- after fluid trapped in ear   Family History  Problem Relation Age of Onset   Healthy Mother    Dementia Father    Healthy Sister    Healthy Sister    Alzheimer's disease Other    Colon cancer Neg Hx    Esophageal cancer Neg Hx    Rectal cancer Neg Hx    Stomach cancer Neg Hx    Colon polyps Neg Hx    Past Surgical History:  Procedure Laterality Date   ankle sx-left fx     ANTERIOR CERVICAL DECOMP/DISCECTOMY FUSION N/A 10/28/2020   Procedure: Anterior Cervical Decompression Fusion - Cervical Four-Cervical Five - Cervical Five-Cervical Six - Cervical Six- Cervical Seven;  Surgeon: Onetha Kuba, MD;  Location: [facility] OR;  Service: Neurosurgery;  Laterality: N/A;  Anterior Cervical Decompression Fusion - Cervical Four-Cervical Five - Cervical Five-Cervical  Six - Cervical Six- Cervical Seven   ATRIAL FIBRILLATION ABLATION N/A 09/05/2021   Procedure: ATRIAL FIBRILLATION ABLATION;  Surgeon: Inocencio Soyla Lunger, MD;  Location: [facility] INVASIVE CV LAB;  Service: Cardiovascular;  Laterality: N/A;   CARDIOVERSION N/A 11/28/2020   Procedure: CARDIOVERSION;  Surgeon: Delford Maude BROCKS, MD;  Location: [facility] ENDOSCOPY;  Service: Cardiovascular;  Laterality: N/A;   CARDIOVERSION N/A 04/15/2021   Procedure: CARDIOVERSION;  Surgeon: Mona Vinie BROCKS, MD;  Location: [facility] ENDOSCOPY;  Service: Cardiovascular;  Laterality: N/A;   CATARACT EXTRACTION     both eyes   CHOLECYSTECTOMY N/A 01/11/2018   Procedure: LAPAROSCOPIC CHOLECYSTECTOMY WITH INTRAOPERATIVE CHOLANGIOGRAM ERAS PATHWAY;  Surgeon: Ethyl Lenis, MD;  Location: [facility] OR;  Service: General;  Laterality: N/A;   COLONOSCOPY  04/23/2005   DIAGNOSTIC LAPAROSCOPY     EYE SURGERY     foot sx     with the ankle surgery   POLYPECTOMY     Social History   Occupational History   Occupation:  Retired  Tobacco Use   Smoking status: Former    Current packs/day: 0.00    Average packs/day: 0.3 packs/day for 50.0 years (12.5 ttl pk-yrs)    Types: Cigarettes    Start date: 10/18/1970    Quit date: 10/17/2020    Years since quitting: 3.3   Smokeless tobacco: Never   Tobacco comments:    Former smoker 06/10/22  Vaping Use   Vaping status: Never Used  Substance and Sexual Activity   Alcohol use: Yes    Alcohol/week: 2.0 standard drinks of alcohol    Types: 2 Standard drinks or equivalent per week    Comment: occassionally   Drug use: No   Sexual activity: Yes

## 2024-02-14 ENCOUNTER — Ambulatory Visit

## 2024-02-14 VITALS — Ht 67.0 in | Wt 221.0 lb

## 2024-02-14 DIAGNOSIS — Z0000 Encounter for general adult medical examination without abnormal findings: Secondary | ICD-10-CM | POA: Diagnosis not present

## 2024-02-14 DIAGNOSIS — M8580 Other specified disorders of bone density and structure, unspecified site: Secondary | ICD-10-CM

## 2024-02-14 NOTE — Progress Notes (Signed)
 Subjective:   Glenda [name] is a 73 y.o. who presents for a Medicare Wellness preventive visit.  As a reminder, Annual Wellness Visits don't include a physical exam, and some assessments may be limited, especially if this visit is performed virtually. We may recommend an in-person follow-up visit with your provider if needed.  Visit Complete: Virtual I connected with  Glenda [name] on 02/14/24 by a audio enabled telemedicine application and verified that I am speaking with the correct person using two identifiers.  Patient Location: Home  Provider Location: Home Office  I discussed the limitations of evaluation and management by telemedicine. The patient expressed understanding and agreed to proceed.  Vital Signs: Because this visit was a virtual/telehealth visit, some criteria may be missing or patient reported. Any vitals not documented were not able to be obtained and vitals that have been documented are patient reported.  VideoDeclined- This patient declined Librarian, academic. Therefore the visit was completed with audio only.  Persons Participating in Visit: Patient.  AWV Questionnaire: Yes: Patient Medicare AWV questionnaire was completed by the patient on 02/11/2024; I have confirmed that all information answered by patient is correct and no changes since this date.  Cardiac Risk Factors include: advanced age (>49men, >41 women);hypertension;Other (see comment);dyslipidemia, Risk factor comments: CVA, OSA, A -fib     Objective:    Today's Vitals   02/14/24 1025  Weight: 221 lb (100.2 kg)  Height: 5' 7 (1.702 m)   Body mass index is 34.61 kg/m.     02/14/2024   10:51 AM 02/23/2023   12:09 PM 04/23/2022   10:16 AM 02/19/2022   10:58 AM 09/05/2021    6:01 AM 07/23/2021   10:55 PM 05/27/2021    9:05 PM  Advanced Directives  Does Patient Have a Medical Advance Directive? No Yes No No No No No  Type of Mudlogger;Living will;Out of facility DNR (pink MOST or yellow form)       Does patient want to make changes to medical advance directive?  Yes (MAU/Ambulatory/Procedural Areas - Information given)       Copy of Healthcare Power of Attorney in Chart?  No - copy requested       Would patient like information on creating a medical advance directive?    No - Patient declined No - Patient declined No - Patient declined No - Patient declined    Current Medications (verified) Outpatient Encounter Medications as of 02/14/2024  Medication Sig   acetaminophen  (TYLENOL ) 500 MG tablet Take 1,000 mg by mouth 2 (two) times daily as needed for moderate pain (pain score 4-6).   ALPRAZolam  (XANAX ) 0.25 MG tablet Take 1 tablet (0.25 mg total) by mouth daily as needed. for anxiety   diclofenac  Sodium (VOLTAREN ) 1 % GEL Apply 4 g topically 4 (four) times daily.   diphenhydramine -acetaminophen  (TYLENOL  PM) 25-500 MG TABS tablet Take 1 tablet by mouth at bedtime as needed (sleep).   ELIQUIS  5 MG TABS tablet TAKE 1 TABLET BY MOUTH TWICE A DAY   flecainide  (TAMBOCOR ) 100 MG tablet TAKE 1 TABLET BY MOUTH TWICE A DAY   hydrochlorothiazide  (MICROZIDE ) 12.5 MG capsule Take 1 capsule (12.5 mg total) by mouth daily. (Patient taking differently: Take 12.5 mg by mouth as needed.)   metoprolol  succinate (TOPROL -XL) 25 MG 24 hr tablet Take 1 tablet (25 mg total) by mouth at bedtime.   PARoxetine  (PAXIL ) 40 MG tablet Take 1 tablet (40 mg  total) by mouth daily.   rosuvastatin  (CRESTOR ) 20 MG tablet Take 1 tablet (20 mg total) by mouth daily. (Patient taking differently: Take 10 mg by mouth daily.)   benzonatate  (TESSALON ) 100 MG capsule Take 1 capsule (100 mg total) by mouth 2 (two) times daily as needed for cough.   [DISCONTINUED] metoprolol  tartrate (LOPRESSOR ) 25 MG tablet Take 1 tablet (25 mg total) by mouth 3 (three) times daily.   No facility-administered encounter medications on file as of 02/14/2024.    Allergies  (verified) Amoxicillin and Erythromycin   History: Past Medical History:  Diagnosis Date   Allergic rhinitis    Allergy    Anxiety    Arthritis    Cataract    Heart murmur    History of colonic polyps    History of PSVT (paroxysmal supraventricular tachycardia)    Hypertension    Impaired glucose tolerance    Obesity    PAT (paroxysmal atrial tachycardia)    Pre-diabetes    hx, not currently being monitor for diabetes, diet controlled   Sleep apnea    Stroke (HCC) 08/2020   Scan revealed stroke in history   Tobacco user    Vertigo    started 3 weeks ago- after fluid trapped in ear   Past Surgical History:  Procedure Laterality Date   ankle sx-left fx     ANTERIOR CERVICAL DECOMP/DISCECTOMY FUSION N/A 10/28/2020   Procedure: Anterior Cervical Decompression Fusion - Cervical Four-Cervical Five - Cervical Five-Cervical Six - Cervical Six- Cervical Seven;  Surgeon: Onetha Kuba, MD;  Location: [facility] OR;  Service: Neurosurgery;  Laterality: N/A;  Anterior Cervical Decompression Fusion - Cervical Four-Cervical Five - Cervical Five-Cervical Six - Cervical Six- Cervical Seven   ATRIAL FIBRILLATION ABLATION N/A 09/05/2021   Procedure: ATRIAL FIBRILLATION ABLATION;  Surgeon: Inocencio Soyla Lunger, MD;  Location: [facility] INVASIVE CV LAB;  Service: Cardiovascular;  Laterality: N/A;   CARDIOVERSION N/A 11/28/2020   Procedure: CARDIOVERSION;  Surgeon: Delford Maude BROCKS, MD;  Location[facility] ENDOSCOPY;  Service: Cardiovascular;  Laterality: N/A;   CARDIOVERSION N/A 04/15/2021   Procedure: CARDIOVERSION;  Surgeon: Mona Vinie BROCKS, MD;  Location: [facility] ENDOSCOPY;  Service: Cardiovascular;  Laterality: N/A;   CATARACT EXTRACTION     both eyes   CHOLECYSTECTOMY N/A 01/11/2018   Procedure: LAPAROSCOPIC CHOLECYSTECTOMY WITH INTRAOPERATIVE CHOLANGIOGRAM ERAS PATHWAY;  Surgeon: Ethyl Lenis, MD;  Location: [facility] OR;  Service: General;  Laterality: N/A;   COLONOSCOPY  04/23/2005   DIAGNOSTIC LAPAROSCOPY     EYE SURGERY      foot sx     with the ankle surgery   POLYPECTOMY     Family History  Problem Relation Age of Onset   Healthy Mother    Dementia Father    Healthy Sister    Healthy Sister    Alzheimer's disease Other    Colon cancer Neg Hx    Esophageal cancer Neg Hx    Rectal cancer Neg Hx    Stomach cancer Neg Hx    Colon polyps Neg Hx    Social History   Socioeconomic History   Marital status: Married    Spouse name: Richard   Number of children: 2   Years of education: college   Highest education level: Bachelor's degree (e.g., BA, AB, BS)  Occupational History   Occupation: Retired  Tobacco Use   Smoking status: Former    Current packs/day: 0.00    Average packs/day: 0.3 packs/day for 50.0 years (12.5 ttl pk-yrs)  Types: Cigarettes    Start date: 10/18/1970    Quit date: 10/17/2020    Years since quitting: 3.3   Smokeless tobacco: Never   Tobacco comments:    Former smoker 06/10/22  Vaping Use   Vaping status: Never Used  Substance and Sexual Activity   Alcohol use: Yes    Alcohol/week: 2.0 standard drinks of alcohol    Types: 2 Standard drinks or equivalent per week    Comment: occassionally   Drug use: No   Sexual activity: Yes  Other Topics Concern   Not on file  Social History Narrative   Lives at home with her husband.   Right-handed.   One cup caffeine per day.   Social Drivers of Corporate investment banker Strain: Low Risk  (02/11/2024)   Overall Financial Resource Strain (CARDIA)    Difficulty of Paying Living Expenses: Not hard at all  Food Insecurity: No Food Insecurity (02/11/2024)   Hunger Vital Sign    Worried About Running Out of Food in the Last Year: Never true    Ran Out of Food in the Last Year: Never true  Transportation Needs: No Transportation Needs (02/14/2024)   PRAPARE - Administrator, Civil Service (Medical): No    Lack of Transportation (Non-Medical): No  Physical Activity: Insufficiently Active (02/11/2024)   Exercise  Vital Sign    Days of Exercise per Week: 2 days    Minutes of Exercise per Session: 40 min  Stress: Stress Concern Present (02/11/2024)   [employer] of Occupational Health - Occupational Stress Questionnaire    Feeling of Stress: To some extent  Social Connections: Moderately Isolated (02/11/2024)   Social Connection and Isolation Panel    Frequency of Communication with Friends and Family: More than three times a week    Frequency of Social Gatherings with Friends and Family: Twice a week    Attends Religious Services: Never    Database administrator or Organizations: No    Attends Engineer, structural: Not on file    Marital Status: Married    Tobacco Counseling Counseling given: Not Answered Tobacco comments: Former smoker 06/10/22    Clinical Intake:  Pre-visit preparation completed: Yes  Pain : No/denies pain     BMI - recorded: 34.61 Nutritional Status: BMI > 30  Obese Nutritional Risks: None Diabetes: No  Lab Results  Component Value Date   HGBA1C 6.3 (H) 04/23/2023   HGBA1C 6.2 03/30/2022   HGBA1C 6.5 03/10/2022     How often do you need to have someone help you when you read instructions, pamphlets, or other written materials from your doctor or pharmacy?: 1 - Never  Interpreter Needed?: No  Information entered by :: Zaniah Titterington, RMA   Activities of Daily Living     02/11/2024    9:14 AM 02/21/2023   10:33 AM  In your present state of health, do you have any difficulty performing the following activities:  Hearing? 0 0  Vision? 0 0  Difficulty concentrating or making decisions? 0 0  Walking or climbing stairs? 0 0  Dressing or bathing? 0 0  Doing errands, shopping? 0 0  Preparing Food and eating ? N N  Using the Toilet? N N  In the past six months, have you accidently leaked urine? Y Y  Do you have problems with loss of bowel control? N N  Managing your Medications? N N  Managing your Finances? N N  Housekeeping or managing  your Housekeeping? N N    Patient Care Team: [place], Betty G, MD as PCP - General (Family Medicine) Lonni Slain, MD as PCP - Cardiology (Cardiology) Inocencio Soyla Lunger, MD as PCP - Electrophysiology (Cardiology) Burnard Debby LABOR, MD (Inactive) as PCP - Sleep Medicine (Cardiology)  I have updated your Care Teams any recent Medical Services you may have received from other providers in the past year.     Assessment:   This is a routine wellness examination for Donte.  Hearing/Vision screen Hearing Screening - Comments:: Denies hearing difficulties   Vision Screening - Comments:: Wears eyeglasses/looking for a new   Goals Addressed             This Visit's Progress    COMPLETED: DIET - REDUCE FAT INTAKE   On track    Low fat diet, avoid frying. Try to walk 15 min daily.        Depression Screen     02/14/2024   10:53 AM 02/23/2023   12:07 PM 03/13/2022    2:40 PM 03/10/2022    9:04 AM 02/19/2022   10:54 AM 02/18/2021    9:08 AM 11/24/2019   10:33 AM  PHQ 2/9 Scores  PHQ - 2 Score 0 0 0 0 0 0 0  PHQ- 9 Score 0          Fall Risk     02/11/2024    9:14 AM 02/23/2023   12:07 PM 02/21/2023   10:33 AM 03/10/2022    9:04 AM 02/19/2022   10:57 AM  Fall Risk   Falls in the past year? 1 1 1  0 1  Number falls in past yr: 0 0 0 0 0  Injury with Fall? 1 0 0 0 0  Comment     No injury or need for medical attention  Risk for fall due to : Impaired balance/gait No Fall Risks  Other (Comment) No Fall Risks  Follow up Falls evaluation completed;Falls prevention discussed Falls evaluation completed  Falls evaluation completed       Data saved with a previous flowsheet row definition    MEDICARE RISK AT HOME:  Medicare Risk at Home Any stairs in or around the home?: (Patient-Rptd) Yes If so, are there any without handrails?: (Patient-Rptd) Yes Home free of loose throw rugs in walkways, pet beds, electrical cords, etc?: (Patient-Rptd) Yes Adequate lighting in your  home to reduce risk of falls?: (Patient-Rptd) Yes Life alert?: (Patient-Rptd) No Use of a cane, walker or w/c?: (Patient-Rptd) No Grab bars in the bathroom?: (Patient-Rptd) Yes Shower chair or bench in shower?: (Patient-Rptd) No Elevated toilet seat or a handicapped toilet?: (Patient-Rptd) No  TIMED UP AND GO:  Was the test performed?  No  Cognitive Function: Declined/Normal: No cognitive concerns noted by patient or family. Patient alert, oriented, able to answer questions appropriately and recall recent events. No signs of memory loss or confusion.        02/19/2022   10:58 AM  6CIT Screen  What Year? 0 points  What month? 0 points  What time? 0 points  Count back from 20 0 points  Months in reverse 0 points  Repeat phrase 0 points  Total Score 0 points    Immunizations Immunization History  Administered Date(s) Administered   Fluad Quad(high Dose 65+) 01/29/2020, 02/18/2021, 03/10/2022   Fluad Trivalent(High Dose 65+) 01/29/2023   INFLUENZA, HIGH DOSE SEASONAL PF 04/02/2016, 03/17/2017, 03/11/2018   Influenza Split 03/13/2011, 03/18/2012   Influenza Whole 02/15/2006, 04/19/2007  Influenza,inj,Quad PF,6+ Mos 03/31/2013, 04/06/2014, 03/22/2015, 02/11/2019   PFIZER(Purple Top)SARS-COV-2 Vaccination 06/23/2019, 07/19/2019, 02/15/2020   Pfizer Covid-19 Vaccine Bivalent Booster 52yrs & up 03/04/2021   Pneumococcal Conjugate-13 05/01/2016   Pneumococcal Polysaccharide-23 06/07/2017   Tdap 03/13/2011    Screening Tests Health Maintenance  Topic Date Due   Zoster Vaccines- Shingrix (1 of 2) Never done   DTaP/Tdap/Td (2 - Td or Tdap) 03/12/2021   Influenza Vaccine  12/17/2023   COVID-19 Vaccine (5 - 2025-26 season) 01/17/2024   Medicare Annual Wellness (AWV)  02/23/2024   Mammogram  05/24/2025   Colonoscopy  05/31/2028   Pneumococcal Vaccine: 50+ Years  Completed   DEXA SCAN  Completed   Hepatitis C Screening  Completed   HPV VACCINES  Aged Out   Meningococcal B  Vaccine  Aged Out    Health Maintenance Items Addressed: See Nurse Notes at the end of this note  Additional Screening:  Vision Screening: Recommended annual ophthalmology exams for early detection of glaucoma and other disorders of the eye. Is the patient up to date with their annual eye exam?  No  Who is the provider or what is the name of the office in which the patient attends annual eye exams? Looking for a new eye doctor-per pt  Dental Screening: Recommended annual dental exams for proper oral hygiene  Community Resource Referral / Chronic Care Management: CRR required this visit?  No   CCM required this visit?  No   Plan:    I have personally reviewed and noted the following in the patient's chart:   Medical and social history Use of alcohol, tobacco or illicit drugs  Current medications and supplements including opioid prescriptions. Patient is not currently taking opioid prescriptions. Functional ability and status Nutritional status Physical activity Advanced directives List of other physicians Hospitalizations, surgeries, and ER visits in previous 12 months Vitals Screenings to include cognitive, depression, and falls Referrals and appointments  In addition, I have reviewed and discussed with patient certain preventive protocols, quality metrics, and best practice recommendations. A written personalized care plan for preventive services as well as general preventive health recommendations were provided to patient.   Davy Westmoreland L Niyam Bisping, CMA   02/14/2024   After Visit Summary: (MyChart) Due to this being a telephonic visit, the after visit summary with patients personalized plan was offered to patient via MyChart   Notes: Patient is due for a DEXA and order has been placed today.  She is also due for a Flu vaccine, which she would like to have done during her next office visit.  Patient had no other concerns to address today.

## 2024-02-14 NOTE — Patient Instructions (Signed)
 Ms. Gali,  Thank you for taking the time for your Medicare Wellness Visit. I appreciate your continued commitment to your health goals. Please review the care plan we discussed, and feel free to reach out if I can assist you further.  Medicare recommends these wellness visits once per year to help you and your care team stay ahead of potential health issues. These visits are designed to focus on prevention, allowing your provider to concentrate on managing your acute and chronic conditions during your regular appointments.  Please note that Annual Wellness Visits do not include a physical exam. Some assessments may be limited, especially if the visit was conducted virtually. If needed, we may recommend a separate in-person follow-up with your provider.  Ongoing Care Seeing your primary care provider every 3 to 6 months helps us  monitor your health and provide consistent, personalized care. Last office visit on 02/23/2023.  Next office visit on 02/25/2024.  Keep up the good work.  Referrals If a referral was made during today's visit and you haven't received any updates within two weeks, please contact the referred provider directly to check on the status. You have an order for:   [x]   Bone Density     Please call for appointment: [facility] [address] [place], [place] [address] [phone]  Make sure to wear two-piece clothing.  No lotions, powders, or deodorants the day of the appointment. Make sure to bring picture ID and insurance card.  Bring list of medications you are currently taking including any supplements.    Recommended Screenings:  Health Maintenance  Topic Date Due   Zoster (Shingles) Vaccine (1 of 2) Never done   DTaP/Tdap/Td vaccine (2 - Td or Tdap) 03/12/2021   Flu Shot  12/17/2023   COVID-19 Vaccine (5 - 2025-26 season) 01/17/2024   Medicare Annual Wellness Visit  02/23/2024   Breast Cancer Screening  05/24/2025   Colon Cancer  Screening  05/31/2028   Pneumococcal Vaccine for age over 59  Completed   DEXA scan (bone density measurement)  Completed   Hepatitis C Screening  Completed   HPV Vaccine  Aged Out   Meningitis B Vaccine  Aged Out       02/14/2024   10:51 AM  Advanced Directives  Does Patient Have a Medical Advance Directive? No   Advance Care Planning is important because it: Ensures you receive medical care that aligns with your values, goals, and preferences. Provides guidance to your family and loved ones, reducing the emotional burden of decision-making during critical moments.  Vision: Annual vision screenings are recommended for early detection of glaucoma, cataracts, and diabetic retinopathy. These exams can also reveal signs of chronic conditions such as diabetes and high blood pressure.  Dental: Annual dental screenings help detect early signs of oral cancer, gum disease, and other conditions linked to overall health, including heart disease and diabetes.  Please see the attached documents for additional preventive care recommendations.

## 2024-02-22 ENCOUNTER — Ambulatory Visit: Admitting: Sports Medicine

## 2024-02-22 ENCOUNTER — Encounter: Payer: Self-pay | Admitting: Sports Medicine

## 2024-02-22 DIAGNOSIS — S86111D Strain of other muscle(s) and tendon(s) of posterior muscle group at lower leg level, right leg, subsequent encounter: Secondary | ICD-10-CM | POA: Diagnosis not present

## 2024-02-22 DIAGNOSIS — M79661 Pain in right lower leg: Secondary | ICD-10-CM

## 2024-02-22 NOTE — Progress Notes (Signed)
 Glenda [name] - 73 y.o. female MRN [id number]  Date of birth: [birth year]  Office Visit Note: Visit Date: 02/22/2024 PCP: [place], Betty G, MD Referred by: [place], Betty G, MD  Subjective: Chief Complaint  Patient presents with   Right Leg - Follow-up   HPI: Glenda [name] is a pleasant 73 y.o. female who presents today for follow-up of gastrocnemius tear of the right calf.  She is just over 3 weeks out from a right medial head of gastrocnemius tear.  We did perform extracorporeal shockwave therapy at that visit in about 48 hours after that she noticed significant improvement in both pain and range of motion.  Still has some stiffness and pain with dorsiflexion about the calf but overall feels she is significantly improved.  Feels about 98% better.  She is walking with essentially little to no pain, still is cautious with calf raises or steps.  She has worn a calf compression sleeve at times, although this does make her skin itch.  Pertinent ROS were reviewed with the patient and found to be negative unless otherwise specified above in HPI.   Assessment & Plan: Visit Diagnoses:  1. Gastrocnemius tear, right, subsequent encounter   2. Right calf pain    Plan: Impression is significantly improving medial head of gastrocnemius Tear which she is just about 3 weeks out.  Did improve markedly with pain and motion after our first shockwave therapy breaking of her hematoma.  We did repeat this treatment again today, patient tolerated well.  Would recommend she continue her Compression sleeve with activity only.  We discussed physical activity guidance and exercise with return to activity on a progressive fashion.  We did print out a customized handout for calf/gastroc therapy over the next 6 weeks that she may progress into on her own or with her personal trainer at [place] well, my athletic trainer Jinnie did review these exercises with her in the room today - perform once daily.  She will continue her  heel raises only in the short-term with walking and steps although may discontinue these over the next 1-2 weeks as her pain with walking resolves.  Okay for Tylenol  as needed, which do avoid NSAIDs given her Eliquis  use.  She will see me back only if she is not completely improved over the next few weeks.  Follow-up: Return if symptoms worsen or fail to improve.   Meds & Orders: No orders of the defined types were placed in this encounter.  No orders of the defined types were placed in this encounter.    Procedures: Procedure: ECSWT Indications:  Gastrocnemius tear   Procedure Details Consent: Risks of procedure as well as the alternatives and risks of each were explained to the patient.  Verbal consent for procedure obtained. Time Out: Verified patient identification, verified procedure, site was marked, verified correct patient position. The area was cleaned with alcohol swab.     The right MHG/Calf was targeted for Extracorporeal shockwave therapy.    Preset: Status post muscular injury Power Level: 100-110 mJ   Frequency: 10-12 Hz Impulse/cycles: 2800 Head size: Regular   Patient tolerated procedure well without immediate complications.      Clinical History: No specialty comments available.  She reports that she quit smoking about 3 years ago. Her smoking use included cigarettes. She started smoking about 53 years ago. She has a 12.5 pack-year smoking history. She has never used smokeless tobacco.  Recent Labs    04/23/23 0950  HGBA1C 6.3*  Objective:    Physical Exam  Gen: Well-appearing, in no acute distress; non-toxic CV: Well-perfused. Warm.  Resp: Breathing unlabored on room air; no wheezing. Psych: Fluid speech in conversation; appropriate affect; normal thought process  Ortho Exam - Right calf: There is some mild tenderness more so at the medial aspect of the gastrocnemius myotendinous junction although no Achilles tenderness.  There is still a degree of  soft tissue swelling and hematoma formation although markedly improved from previous visit.  Patient is able to perform active and passive dorsiflexion and plantarflexion.  Bilateral heel raise at one half height impact.  Intact Thompson squeeze test.  Imaging: No results found.  Past Medical/Family/Surgical/Social History: Medications & Allergies reviewed per EMR, new medications updated. Patient Active Problem List   Diagnosis Date Noted   OSA (obstructive sleep apnea) 08/26/2023   Encounter for monitoring flecainide  therapy 05/27/2023   Hypercoagulable state due to persistent atrial fibrillation (HCC) 06/10/2022   Persistent atrial fibrillation (HCC)    HNP (herniated nucleus pulposus) with myelopathy, cervical 10/28/2020   Paresthesia 09/26/2020   Cerebrovascular accident (CVA) (HCC) 09/02/2020   Weakness 08/29/2020   Right cervical radiculopathy 08/29/2020   Hyperlipidemia 11/24/2019   Anxiety disorder, unspecified 11/25/2007   ALLERGIC RHINITIS 11/25/2007   History of colonic polyps 11/25/2007   Class 1 obesity with body mass index (BMI) of 32.0 to 32.9 in adult 11/24/2006   PANIC DISORDER 11/24/2006   Essential hypertension 11/24/2006   PAROXYSMAL SUPRAVENTRICULAR TACHYCARDIA 11/24/2006   Past Medical History:  Diagnosis Date   Allergic rhinitis    Allergy    Anxiety    Arthritis    Cataract    Heart murmur    History of colonic polyps    History of PSVT (paroxysmal supraventricular tachycardia)    Hypertension    Impaired glucose tolerance    Obesity    PAT (paroxysmal atrial tachycardia)    Pre-diabetes    hx, not currently being monitor for diabetes, diet controlled   Sleep apnea    Stroke (HCC) 08/2020   Scan revealed stroke in history   Tobacco user    Vertigo    started 3 weeks ago- after fluid trapped in ear   Family History  Problem Relation Age of Onset   Healthy Mother    Dementia Father    Healthy Sister    Healthy Sister    Alzheimer's  disease Other    Colon cancer Neg Hx    Esophageal cancer Neg Hx    Rectal cancer Neg Hx    Stomach cancer Neg Hx    Colon polyps Neg Hx    Past Surgical History:  Procedure Laterality Date   ankle sx-left fx     ANTERIOR CERVICAL DECOMP/DISCECTOMY FUSION N/A 10/28/2020   Procedure: Anterior Cervical Decompression Fusion - Cervical Four-Cervical Five - Cervical Five-Cervical Six - Cervical Six- Cervical Seven;  Surgeon: Onetha Kuba, MD;  Location: [facility] OR;  Service: Neurosurgery;  Laterality: N/A;  Anterior Cervical Decompression Fusion - Cervical Four-Cervical Five - Cervical Five-Cervical Six - Cervical Six- Cervical Seven   ATRIAL FIBRILLATION ABLATION N/A 09/05/2021   Procedure: ATRIAL FIBRILLATION ABLATION;  Surgeon: Inocencio Soyla Lunger, MD;  Location: [facility] INVASIVE CV LAB;  Service: Cardiovascular;  Laterality: N/A;   CARDIOVERSION N/A 11/28/2020   Procedure: CARDIOVERSION;  Surgeon: Delford Maude BROCKS, MD;  Location: [facility] ENDOSCOPY;  Service: Cardiovascular;  Laterality: N/A;   CARDIOVERSION N/A 04/15/2021   Procedure: CARDIOVERSION;  Surgeon: Mona Vinie BROCKS, MD;  Location: [facility]  ENDOSCOPY;  Service: Cardiovascular;  Laterality: N/A;   CATARACT EXTRACTION     both eyes   CHOLECYSTECTOMY N/A 01/11/2018   Procedure: LAPAROSCOPIC CHOLECYSTECTOMY WITH INTRAOPERATIVE CHOLANGIOGRAM ERAS PATHWAY;  Surgeon: Ethyl Lenis, MD;  Location: [facility] OR;  Service: General;  Laterality: N/A;   COLONOSCOPY  04/23/2005   DIAGNOSTIC LAPAROSCOPY     EYE SURGERY     foot sx     with the ankle surgery   POLYPECTOMY     Social History   Occupational History   Occupation: Retired  Tobacco Use   Smoking status: Former    Current packs/day: 0.00    Average packs/day: 0.3 packs/day for 50.0 years (12.5 ttl pk-yrs)    Types: Cigarettes    Start date: 10/18/1970    Quit date: 10/17/2020    Years since quitting: 3.3   Smokeless tobacco: Never   Tobacco comments:    Former smoker 06/10/22  Vaping Use   Vaping  status: Never Used  Substance and Sexual Activity   Alcohol use: Yes    Alcohol/week: 2.0 standard drinks of alcohol    Types: 2 Standard drinks or equivalent per week    Comment: occassionally   Drug use: No   Sexual activity: Yes

## 2024-02-22 NOTE — Progress Notes (Signed)
 Patient says that she is feeling about 98% improved since her last visit. She says that she began noticing this improvement a few days after her appointment, and has gradually continued to improve overtime. She still has some discomfort, and is cautious, with calf raises or stairs. She has worn a compression sleeve as needed, but not constantly as it makes her skin itch.   Patient was instructed in 10 minutes of therapeutic exercises for right calf to improve strength, ROM and function according to my instructions and plan of care by a Certified Athletic Trainer during the office visit. A customized handout was provided and demonstration of proper technique shown and discussed. Patient did perform exercises and demonstrate understanding through teachback.  All questions discussed and answered.

## 2024-02-24 ENCOUNTER — Ambulatory Visit (HOSPITAL_COMMUNITY)
Admission: RE | Admit: 2024-02-24 | Discharge: 2024-02-24 | Disposition: A | Discharge status: Home / Self Care | Source: Ambulatory Visit | Attending: Physician Assistant | Admitting: Physician Assistant

## 2024-02-24 VITALS — BP 162/76 | HR 56 | Ht 67.0 in | Wt 217.4 lb

## 2024-02-24 DIAGNOSIS — I4819 Other persistent atrial fibrillation: Secondary | ICD-10-CM

## 2024-02-24 DIAGNOSIS — I4891 Unspecified atrial fibrillation: Secondary | ICD-10-CM | POA: Diagnosis not present

## 2024-02-24 DIAGNOSIS — Z79899 Other long term (current) drug therapy: Secondary | ICD-10-CM | POA: Diagnosis not present

## 2024-02-24 DIAGNOSIS — Z5181 Encounter for therapeutic drug level monitoring: Secondary | ICD-10-CM

## 2024-02-24 DIAGNOSIS — D6869 Other thrombophilia: Secondary | ICD-10-CM | POA: Diagnosis not present

## 2024-02-24 MED ORDER — METOPROLOL SUCCINATE ER 25 MG PO TB24: 12.5000 mg | ORAL_TABLET | Freq: Every day | ORAL | 1 refills | Status: AC

## 2024-02-24 NOTE — Progress Notes (Signed)
 Primary Care Physician: [place], Betty G, MD Primary Cardiologist: Shelda Bruckner, MD Electrophysiologist: Will Gladis Norton, MD  Referring Physician: [facility]   Glenda [name] is a 73 y.o. female with a history of CVA, HTN, OSA, aortic atherosclerosis, atrial fibrillation who presents for follow up in the [facility]. S/p afib ablation 09/05/21, maintained on flecainide . Patient is on Eliquis  for stroke prevention.   Patient returns for follow up for atrial fibrillation and flecainide  monitoring. She reports that lately she has been having more frequent episodes of afib with symptoms of palpitations and SOB. It was especially evident when she went to visit her family in [place] . She did cut back her Toprol  to half a tablet due to low BP and heart rate readings. No bleeding issues on anticoagulation.   Today, she  denies symptoms of chest pain, orthopnea, PND, lower extremity edema, dizziness, presyncope, syncope, snoring, daytime somnolence, bleeding, or neurologic sequela. The patient is tolerating medications without difficulties and is otherwise without complaint today.    Atrial Fibrillation Risk Factors:  she does have symptoms or diagnosis of sleep apnea. Sleep study 05/27/21 she does not have a history of rheumatic fever.   Atrial Fibrillation Management history:  Previous antiarrhythmic drugs: flecainide   Previous cardioversions: 11/28/20, 04/15/21 Previous ablations: 09/05/21 Anticoagulation history: Eliquis   ROS- All systems are reviewed and negative except as per the HPI above.   Physical Exam: BP (!) 162/76   Pulse (!) 56   Ht 5' 7 (1.702 m)   Wt 98.6 kg   BMI 34.05 kg/m    GEN: Well nourished, well developed in no acute distress CARDIAC: Regular rate and rhythm with occasional ectopy, no murmurs, rubs, gallops RESPIRATORY:  Clear to auscultation without rales, wheezing or rhonchi  ABDOMEN: Soft, non-tender,  non-distended EXTREMITIES:  No edema; No deformity    Wt Readings from Last 3 Encounters:  02/24/24 98.6 kg  02/14/24 100.2 kg  08/26/23 100.4 kg     EKG today demonstrates  SR, PAC, low P wave voltage Vent. rate 56 BPM PR interval * ms QRS duration 98 ms QT/QTcB 402/387 m   Echo 03/06/21 demonstrated   1. Left ventricular ejection fraction, by estimation, is 60 to 65%. The  left ventricle has normal function. The left ventricle has no regional  wall motion abnormalities. Left ventricular diastolic parameters are  indeterminate.   2. Right ventricular systolic function is normal. The right ventricular  size is normal. There is normal pulmonary artery systolic pressure.   3. The mitral valve is normal in structure. No evidence of mitral valve  regurgitation. No evidence of mitral stenosis.   4. The aortic valve was not well visualized. Aortic valve regurgitation  is not visualized.   5. The inferior vena cava is normal in size with greater than 50%  respiratory variability, suggesting right atrial pressure of 3 mmHg.   Comparison(s): A prior study was performed on 09/13/20. No significant  change from prior study.    CHA2DS2-VASc Score = 6  The patient's score is based upon: CHF History: 0 HTN History: 1 Diabetes History: 0 Stroke History: 2 Vascular Disease History: 1 (aortic atherosclerosis) Age Score: 1 Gender Score: 1       ASSESSMENT AND PLAN: Persistent Atrial Fibrillation (ICD10:  I48.19) The patient's CHA2DS2-VASc score is 6, indicating a 9.7% annual risk of stroke.   S/p afib ablation 09/05/21 Patient having more frequent episodes of afib. We discussed rhythm control options. She has  not felt well on higher doses of flecainide  or Toprol . She is agreeable to discussing repeat ablation with Dr Inocencio.  Continue flecainide  100 mg AM and 50 mg PM Continue Toprol  12.5 mg daily Continue Eliquis  5 mg BID Apple Watch for home monitoring.   Secondary  Hypercoagulable State (ICD10:  D68.69) The patient is at significant risk for stroke/thromboembolism based upon her CHA2DS2-VASc Score of 6.  Continue Apixaban  (Eliquis ).   High Risk Medication Monitoring (ICD 10: [id number]) Patient requires ongoing monitoring for anti-arrhythmic medication which has the potential to cause life threatening arrhythmias. Intervals on ECG acceptable for flecainide  monitoring.     OSA  Not on CPAP Patient declined repeat sleep study  HTN Elevated today but had been well controlled at home.  Was low on hydrochlorothiazide  and Toprol  per patient report. No changes today.    Follow up with Dr Inocencio to discuss repeat ablation.    Daril Kicks PA-C [facility] [address] [place], [place] [address] [phone]

## 2024-02-25 ENCOUNTER — Ambulatory Visit: Admitting: Family Medicine

## 2024-02-25 ENCOUNTER — Encounter: Payer: Self-pay | Admitting: Family Medicine

## 2024-02-25 VITALS — BP 122/72 | HR 55 | Temp 97.9°F | Resp 16 | Ht 67.0 in | Wt 221.0 lb

## 2024-02-25 DIAGNOSIS — M85852 Other specified disorders of bone density and structure, left thigh: Secondary | ICD-10-CM

## 2024-02-25 DIAGNOSIS — F419 Anxiety disorder, unspecified: Secondary | ICD-10-CM

## 2024-02-25 DIAGNOSIS — R7303 Prediabetes: Secondary | ICD-10-CM

## 2024-02-25 DIAGNOSIS — M25541 Pain in joints of right hand: Secondary | ICD-10-CM | POA: Diagnosis not present

## 2024-02-25 DIAGNOSIS — M109 Gout, unspecified: Secondary | ICD-10-CM | POA: Diagnosis not present

## 2024-02-25 DIAGNOSIS — E785 Hyperlipidemia, unspecified: Secondary | ICD-10-CM | POA: Diagnosis not present

## 2024-02-25 DIAGNOSIS — Z23 Encounter for immunization: Secondary | ICD-10-CM

## 2024-02-25 DIAGNOSIS — Z0000 Encounter for general adult medical examination without abnormal findings: Secondary | ICD-10-CM

## 2024-02-25 DIAGNOSIS — M25542 Pain in joints of left hand: Secondary | ICD-10-CM

## 2024-02-25 LAB — HEMOGLOBIN A1C: Hgb A1c MFr Bld: 6.3 % (ref 4.6–6.5)

## 2024-02-25 LAB — LIPID PANEL
Cholesterol: 163 mg/dL (ref 0–200)
HDL: 38.1 mg/dL — ABNORMAL LOW (ref >39.00)
LDL Cholesterol: 104 mg/dL — ABNORMAL HIGH (ref 0–99)
NonHDL: 125.31
Total CHOL/HDL Ratio: 4
Triglycerides: 106 mg/dL (ref 0.0–149.0)
VLDL: 21.2 mg/dL (ref 0.0–40.0)

## 2024-02-25 LAB — COMPREHENSIVE METABOLIC PANEL WITH GFR
ALT: 13 U/L (ref 0–35)
AST: 16 U/L (ref 0–37)
Albumin: 4 g/dL (ref 3.5–5.2)
Alkaline Phosphatase: 75 U/L (ref 39–117)
BUN: 17 mg/dL (ref 6–23)
CO2: 27 meq/L (ref 19–32)
Calcium: 8.9 mg/dL (ref 8.4–10.5)
Chloride: 102 meq/L (ref 96–112)
Creatinine, Ser: 0.76 mg/dL (ref 0.40–1.20)
GFR: 77.87 mL/min (ref >60.00)
Glucose, Bld: 110 mg/dL — ABNORMAL HIGH (ref 70–99)
Potassium: 4.4 meq/L (ref 3.5–5.1)
Sodium: 137 meq/L (ref 135–145)
Total Bilirubin: 0.9 mg/dL (ref 0.2–1.2)
Total Protein: 7.6 g/dL (ref 6.0–8.3)

## 2024-02-25 LAB — URIC ACID: Uric Acid, Serum: 6.3 mg/dL (ref 2.4–7.0)

## 2024-02-25 LAB — SEDIMENTATION RATE: Sed Rate: 50 mm/h — ABNORMAL HIGH (ref 0–30)

## 2024-02-25 LAB — C-REACTIVE PROTEIN: CRP: <0.5 mg/dL (ref 0.5–20.0)

## 2024-02-25 LAB — VITAMIN D 25 HYDROXY (VIT D DEFICIENCY, FRACTURES): VITD: 20.2 ng/mL — ABNORMAL LOW (ref 30.00–100.00)

## 2024-02-25 MED ORDER — ALPRAZOLAM 0.25 MG PO TABS: 0.2500 mg | ORAL_TABLET | Freq: Every day | ORAL | 1 refills | Status: AC | PRN

## 2024-02-25 NOTE — Patient Instructions (Addendum)
 A few things to remember from today's visit:  Routine general medical examination at a health care facility  Anxiety disorder, unspecified type - Plan: ALPRAZolam  (XANAX ) 0.25 MG tablet  Influenza vaccine needed - Plan: Flu vaccine HIGH DOSE PF(Fluzone Trivalent)  Arthralgia of both hands - Plan: C-reactive protein, Sedimentation rate, Rheumatoid Factor, Cyclic citrul peptide antibody, IgG  Osteopenia of left hip - Plan: VITAMIN D 25 Hydroxy (Vit-D Deficiency, Fractures)  Hyperlipidemia, unspecified hyperlipidemia type - Plan: Comprehensive metabolic panel with GFR, Lipid panel  Prediabetes - Plan: Hemoglobin A1c  Gouty arthritis of great toe - Plan: Uric acid No changes today.  If you need refills for medications you take chronically, please call your pharmacy. Do not use My Chart to request refills or for acute issues that need immediate attention. If you send a my chart message, it may take a few days to be addressed, specially if I am not in the office.  Please be sure medication list is accurate. If a new problem present, please set up appointment sooner than planned today.

## 2024-02-25 NOTE — Assessment & Plan Note (Signed)
 Hemoglobin A1c 6.3 in 04/2023. Encouraged consistency with a healthy lifestyle for diabetes prevention.

## 2024-02-25 NOTE — Assessment & Plan Note (Signed)
 Currently she is on paroxetine  40 mg daily and takes alprazolam  0.25 mg daily as needed, the latter one she does not take very frequent, last refill in 2023. No changes today. As far as alprazolam  does not need to be refilled, she can continue annual follow-ups.

## 2024-02-25 NOTE — Assessment & Plan Note (Signed)
 One episode last year, no prior history. Continue low purine diet. Uric acid added to today's labs.

## 2024-02-25 NOTE — Assessment & Plan Note (Signed)
 Continue adequate calcium  and vitamin D intake as well as fall prevention. DEXA will be arranged.

## 2024-02-25 NOTE — Assessment & Plan Note (Addendum)
 We discussed possible etiologies, most likely OA. Hx suggest mild trigger fingers right hand, not appreciated today. We decided to hold on sport medicine referral.  Rheumatology workup ordered today (RF, CCP, CRP, and sed rate), further recommendation will be given accordingly.

## 2024-02-25 NOTE — Assessment & Plan Note (Signed)
 She is no longer on rosuvastatin . Continue low-fat diet, further recommendation will be given according to lipid panel result.

## 2024-02-25 NOTE — Progress Notes (Addendum)
 Chief Complaint  Patient presents with   Annual Exam   HPI: Glenda [name] is a 73 y.o. female with past medical history significant for hypertension, prediabetes, atrial fibrillation on chronic anticoagulation, CVA, hyperlipidemia, and anxiety who is here today for her routine physical and follow up.  Last CPE: 03/10/2022.  She is exercising regularly, fitness senior exercise classes and videos at home to help with balance. Cooks at home, snacks or crackers with cheese and peanut butter. Sleeps about 6-7 hours. Drinks cocktails 1-2 times per week up to to 2 drinks. Former smoker, quit in 2022, 12.5 pack per year. She has regular eye and dental exams.  Immunization History  Administered Date(s) Administered   Fluad Quad(high Dose 65+) 01/29/2020, 02/18/2021, 03/10/2022   Fluad Trivalent(High Dose 65+) 01/29/2023   INFLUENZA, HIGH DOSE SEASONAL PF 04/02/2016, 03/17/2017, 03/11/2018, 02/25/2024   Influenza Split 03/13/2011, 03/18/2012   Influenza Whole 02/15/2006, 04/19/2007   Influenza,inj,Quad PF,6+ Mos 03/31/2013, 04/06/2014, 03/22/2015, 02/11/2019   PFIZER(Purple Top)SARS-COV-2 Vaccination 06/23/2019, 07/19/2019, 02/15/2020   Pfizer Covid-19 Vaccine Bivalent Booster 81yrs & up 03/04/2021   Pneumococcal Conjugate-13 05/01/2016   Pneumococcal Polysaccharide-23 06/07/2017   Tdap 03/13/2011   Health Maintenance  Topic Date Due   COVID-19 Vaccine (5 - 2025-26 season) 03/12/2024 (Originally 01/17/2024)   Zoster Vaccines- Shingrix (1 of 2) 05/27/2024 (Originally 12/20/1969)   DTaP/Tdap/Td (2 - Td or Tdap) 02/24/2025 (Originally 03/12/2021)   Medicare Annual Wellness (AWV)  02/13/2025   Mammogram  05/24/2025   Colonoscopy  05/31/2028   Pneumococcal Vaccine: 50+ Years  Completed   Influenza Vaccine  Completed   DEXA SCAN  Completed   Hepatitis C Screening  Completed   Meningococcal B Vaccine  Aged Out   Her hemoglobin A1c has been mildly elevated, no history of diabetes.    Lab Results  Component Value Date   HGBA1C 6.3 (H) 04/23/2023    HLD: She is on Rosuvastatin  20 mg daily. Lab Results  Component Value Date   CHOL 144 04/23/2023   HDL 43 04/23/2023   LDLCALC 81 04/23/2023   LDLDIRECT 170.5 06/06/2010   TRIG 109 04/23/2023   CHOLHDL 3.3 04/23/2023    HTN: She takes HCTZ 12.5 mg daily as needed and metoprolol  Succinate 25 mg 1/2 tablet daily.  She is on flecainide  100 mg twice daily and Eliquis  5 mg twice daily for atrial fibrillation. She follows with the atrial fibrillation clinic, last seen yesterday.  Lab Results  Component Value Date   NA 142 04/23/2023   CL 101 04/23/2023   K 4.4 04/23/2023   CO2 26 04/23/2023   BUN 19 04/23/2023   CREATININE 1.02 (H) 04/23/2023   EGFR 58 (L) 04/23/2023   CALCIUM  9.3 04/23/2023   ALBUMIN 4.3 04/23/2023   GLUCOSE 117 (H) 04/23/2023   Anxiety on Paxil  40 mg daily and Alprazolam  0.25 mg daily prn.Takes Alprazolam  occasionally, specially when flying. She has taken these medications for a while and still helping.  Long history of bilateral hand joint pain, she is concerned about possibility of gout. Sometimes some puffiness around joints. PIP and  MCP specially. Right index and third finger get lack in the morning, mildly. A year ago she had an episode of severe right great toe pain , edema, and erythema. No Fhx of RA.  She has been following with sport medicine for right gastrocnemius tear, pain has improved.  Osteopenia left hip. Last DEXA 01/2020.  Review of Systems  Constitutional:  Negative for activity change, appetite  change and fever.  HENT:  Negative for mouth sores and sore throat.   Eyes:  Negative for redness and visual disturbance.  Respiratory:  Negative for cough, shortness of breath and wheezing.   Cardiovascular:  Negative for chest pain and leg swelling.  Gastrointestinal:  Negative for abdominal pain, nausea and vomiting.  Endocrine: Negative for cold intolerance, heat  intolerance, polydipsia, polyphagia and polyuria.  Genitourinary:  Negative for decreased urine volume, dysuria and hematuria.  Musculoskeletal:  Positive for arthralgias. Negative for gait problem.  Skin:  Negative for color change and rash.  Allergic/Immunologic: Positive for environmental allergies.  Neurological:  Negative for syncope, weakness and headaches.  Psychiatric/Behavioral:  Negative for confusion and hallucinations.   All other systems reviewed and are negative.  Current Outpatient Medications on File Prior to Visit  Medication Sig Dispense Refill   diclofenac  Sodium (VOLTAREN ) 1 % GEL Apply 4 g topically 4 (four) times daily. (Patient taking differently: Apply 4 g topically as needed.) 30 g 0   diphenhydramine -acetaminophen  (TYLENOL  PM) 25-500 MG TABS tablet Take 1 tablet by mouth at bedtime as needed (sleep).     ELIQUIS  5 MG TABS tablet TAKE 1 TABLET BY MOUTH TWICE A DAY 180 tablet 3   flecainide  (TAMBOCOR ) 100 MG tablet TAKE 1 TABLET BY MOUTH TWICE A DAY 180 tablet 2   hydrochlorothiazide  (MICROZIDE ) 12.5 MG capsule Take 1 capsule (12.5 mg total) by mouth daily. (Patient taking differently: Take 12.5 mg by mouth as needed.) 90 capsule 3   metoprolol  succinate (TOPROL -XL) 25 MG 24 hr tablet Take 0.5 tablets (12.5 mg total) by mouth at bedtime. 90 tablet 1   PARoxetine  (PAXIL ) 40 MG tablet Take 1 tablet (40 mg total) by mouth daily. 90 tablet 1   acetaminophen  (TYLENOL ) 500 MG tablet Take 1,000 mg by mouth 2 (two) times daily as needed for moderate pain (pain score 4-6). (Patient not taking: Reported on 02/25/2024)     rosuvastatin  (CRESTOR ) 20 MG tablet Take 1 tablet (20 mg total) by mouth daily. (Patient not taking: Reported on 02/25/2024) 90 tablet 1   [DISCONTINUED] metoprolol  tartrate (LOPRESSOR ) 25 MG tablet Take 1 tablet (25 mg total) by mouth 3 (three) times daily. 90 tablet 0   No current facility-administered medications on file prior to visit.    Past Medical  History:  Diagnosis Date   Allergic rhinitis    Allergy    Anxiety    Arthritis    Cataract    Heart murmur    History of colonic polyps    History of PSVT (paroxysmal supraventricular tachycardia)    Hypertension    Impaired glucose tolerance    Obesity    PAT (paroxysmal atrial tachycardia)    Pre-diabetes    hx, not currently being monitor for diabetes, diet controlled   Sleep apnea    Stroke (HCC) 08/2020   Scan revealed stroke in history   Tobacco user    Vertigo    started 3 weeks ago- after fluid trapped in ear    Past Surgical History:  Procedure Laterality Date   ankle sx-left fx     ANTERIOR CERVICAL DECOMP/DISCECTOMY FUSION N/A 10/28/2020   Procedure: Anterior Cervical Decompression Fusion - Cervical Four-Cervical Five - Cervical Five-Cervical Six - Cervical Six- Cervical Seven;  Surgeon: Onetha Kuba, MD;  Location: [facility] OR;  Service: Neurosurgery;  Laterality: N/A;  Anterior Cervical Decompression Fusion - Cervical Four-Cervical Five - Cervical Five-Cervical Six - Cervical Six- Cervical Seven   ATRIAL FIBRILLATION ABLATION  N/A 09/05/2021   Procedure: ATRIAL FIBRILLATION ABLATION;  Surgeon: Inocencio Soyla Lunger, MD;  Location: [facility] INVASIVE CV LAB;  Service: Cardiovascular;  Laterality: N/A;   CARDIOVERSION N/A 11/28/2020   Procedure: CARDIOVERSION;  Surgeon: Delford Maude BROCKS, MD;  Location: [facility] ENDOSCOPY;  Service: Cardiovascular;  Laterality: N/A;   CARDIOVERSION N/A 04/15/2021   Procedure: CARDIOVERSION;  Surgeon: Mona Vinie BROCKS, MD;  Location: [facility] ENDOSCOPY;  Service: Cardiovascular;  Laterality: N/A;   CATARACT EXTRACTION     both eyes   CHOLECYSTECTOMY N/A 01/11/2018   Procedure: LAPAROSCOPIC CHOLECYSTECTOMY WITH INTRAOPERATIVE CHOLANGIOGRAM ERAS PATHWAY;  Surgeon: Ethyl Lenis, MD;  Location: [facility] OR;  Service: General;  Laterality: N/A;   COLONOSCOPY  04/23/2005   DIAGNOSTIC LAPAROSCOPY     EYE SURGERY     foot sx     with the ankle surgery   POLYPECTOMY       Allergies  Allergen Reactions   Amoxicillin Rash and Other (See Comments)    Has patient had a PCN reaction causing immediate rash, facial/tongue/throat swelling, SOB or lightheadedness with hypotension: No HAS PT DEVELOPED SEVERE RASH INVOLVING MUCUS MEMBRANES or SKIN NECROSIS: #  #  YES  #  #  Has patient had a PCN reaction that required hospitalization: No Has patient had a PCN reaction occurring within the last 10 years: No    Erythromycin Other (See Comments)    Upset stomach    Family History  Problem Relation Age of Onset   Healthy Mother    Dementia Father    Healthy Sister    Healthy Sister    Alzheimer's disease Other    Colon cancer Neg Hx    Esophageal cancer Neg Hx    Rectal cancer Neg Hx    Stomach cancer Neg Hx    Colon polyps Neg Hx     Social History   Socioeconomic History   Marital status: Married    Spouse name: Richard   Number of children: 2   Years of education: college   Highest education level: Bachelor's degree (e.g., BA, AB, BS)  Occupational History   Occupation: Retired  Tobacco Use   Smoking status: Former    Current packs/day: 0.00    Average packs/day: 0.3 packs/day for 50.0 years (12.5 ttl pk-yrs)    Types: Cigarettes    Start date: 10/18/1970    Quit date: 10/17/2020    Years since quitting: 3.3   Smokeless tobacco: Never   Tobacco comments:    Former smoker 06/10/22  Vaping Use   Vaping status: Never Used  Substance and Sexual Activity   Alcohol use: Yes    Alcohol/week: 2.0 standard drinks of alcohol    Types: 2 Standard drinks or equivalent per week    Comment: occassionally   Drug use: No   Sexual activity: Yes  Other Topics Concern   Not on file  Social History Narrative   Lives at home with her husband.   Right-handed.   One cup caffeine per day.   Social Drivers of Corporate investment banker Strain: Low Risk  (02/11/2024)   Overall Financial Resource Strain (CARDIA)    Difficulty of Paying Living Expenses:  Not hard at all  Food Insecurity: No Food Insecurity (02/11/2024)   Hunger Vital Sign    Worried About Running Out of Food in the Last Year: Never true    Ran Out of Food in the Last Year: Never true  Transportation Needs: No Transportation Needs (02/14/2024)   PRAPARE -  Administrator, Civil Service (Medical): No    Lack of Transportation (Non-Medical): No  Physical Activity: Insufficiently Active (02/11/2024)   Exercise Vital Sign    Days of Exercise per Week: 2 days    Minutes of Exercise per Session: 40 min  Stress: Stress Concern Present (02/11/2024)   [employer] of Occupational Health - Occupational Stress Questionnaire    Feeling of Stress: To some extent  Social Connections: Moderately Isolated (02/11/2024)   Social Connection and Isolation Panel    Frequency of Communication with Friends and Family: More than three times a week    Frequency of Social Gatherings with Friends and Family: Twice a week    Attends Religious Services: Never    Database administrator or Organizations: No    Attends Engineer, structural: Not on file    Marital Status: Married   Today's Vitals   02/25/24 1018  BP: 122/72  Pulse: (!) 55  Resp: 16  Temp: 97.9 F (36.6 C)  SpO2: 97%  Weight: 221 lb (100.2 kg)  Height: 5' 7 (1.702 m)   Body mass index is 34.61 kg/m.  Wt Readings from Last 3 Encounters:  02/25/24 221 lb (100.2 kg)  02/24/24 217 lb 6.4 oz (98.6 kg)  02/14/24 221 lb (100.2 kg)   Physical Exam Vitals and nursing note reviewed.  Constitutional:      General: She is not in acute distress.    Appearance: She is well-developed.  HENT:     Head: Normocephalic and atraumatic.     Right Ear: Hearing, tympanic membrane, ear canal and external ear normal.     Left Ear: Hearing, tympanic membrane, ear canal and external ear normal.     Mouth/Throat:     Mouth: Mucous membranes are moist.     Pharynx: Oropharynx is clear. Uvula midline.  Eyes:      Extraocular Movements: Extraocular movements intact.     Conjunctiva/sclera: Conjunctivae normal.     Pupils: Pupils are equal, round, and reactive to light.  Neck:     Thyroid : No thyroid  mass or thyromegaly.  Cardiovascular:     Rate and Rhythm: Bradycardia present. Rhythm irregular.     Pulses:          Dorsalis pedis pulses are 2+ on the right side and 2+ on the left side.     Heart sounds: No murmur heard. Pulmonary:     Effort: Pulmonary effort is normal. No respiratory distress.     Breath sounds: Normal breath sounds.  Abdominal:     Palpations: Abdomen is soft. There is no hepatomegaly or mass.     Tenderness: There is no abdominal tenderness.  Musculoskeletal:     Comments: No signs of synovitis appreciated. Heberden's node (DIP).   Lymphadenopathy:     Cervical: No cervical adenopathy.  Skin:    General: Skin is warm.     Findings: No erythema or rash.  Neurological:     General: No focal deficit present.     Mental Status: She is alert and oriented to person, place, and time.     Cranial Nerves: No cranial nerve deficit.     Coordination: Coordination normal.     Gait: Gait normal.     Deep Tendon Reflexes:     Reflex Scores:      Bicep reflexes are 2+ on the right side and 2+ on the left side.      Patellar reflexes are 2+ on the  right side and 2+ on the left side. Psychiatric:        Mood and Affect: Mood and affect normal.   ASSESSMENT AND PLAN: Glenda [name] was here today annual physical examination, follow up,and joint pain.  Orders Placed This Encounter  Procedures   Flu vaccine HIGH DOSE PF(Fluzone Trivalent)   Comprehensive metabolic panel with GFR   Hemoglobin A1c   Lipid panel   VITAMIN D 25 Hydroxy (Vit-D Deficiency, Fractures)   C-reactive protein   Sedimentation rate   Rheumatoid Factor   Cyclic citrul peptide antibody, IgG   Uric acid   Lab Results  Component Value Date   CHOL 163 02/25/2024   HDL 38.10 (L) 02/25/2024   LDLCALC  104 (H) 02/25/2024   LDLDIRECT 170.5 06/06/2010   TRIG 106.0 02/25/2024   CHOLHDL 4 02/25/2024   Lab Results  Component Value Date   HGBA1C 6.3 02/25/2024   Lab Results  Component Value Date   NA 137 02/25/2024   CL 102 02/25/2024   K 4.4 02/25/2024   CO2 27 02/25/2024   BUN 17 02/25/2024   CREATININE 0.76 02/25/2024   GFR 77.87 02/25/2024   CALCIUM  8.9 02/25/2024   ALBUMIN 4.0 02/25/2024   GLUCOSE 110 (H) 02/25/2024   Lab Results  Component Value Date   ALT 13 02/25/2024   AST 16 02/25/2024   ALKPHOS 75 02/25/2024   BILITOT 0.9 02/25/2024   Lab Results  Component Value Date   CRP <0.5 02/25/2024   Lab Results  Component Value Date   ESRSEDRATE 50 (H) 02/25/2024   Routine general medical examination at a health care facility Assessment & Plan: We discussed the importance of regular physical activity and healthy diet for prevention of chronic illness and/or complications. Preventive guidelines reviewed. Vaccination up to date. Next CPE in a year.   Arthralgia of both hands Assessment & Plan: We discussed possible etiologies, most likely OA. Hx suggest mild trigger fingers right hand, not appreciated today. We decided to hold on sport medicine referral.  Rheumatology workup ordered today (RF, CCP, CRP, and sed rate), further recommendation will be given accordingly.  Orders: -     C-reactive protein; Future -     Sedimentation rate; Future -     Rheumatoid factor; Future -     Cyclic citrul peptide antibody, IgG; Future  Osteopenia of left hip Assessment & Plan: Continue adequate calcium  and vitamin D intake as well as fall prevention. DEXA will be arranged.  Orders: -     VITAMIN D 25 Hydroxy (Vit-D Deficiency, Fractures); Future -     DG Bone Density  Anxiety disorder, unspecified type Assessment & Plan: Currently she is on paroxetine  40 mg daily and takes alprazolam  0.25 mg daily as needed, the latter one she does not take very frequent, last  refill in 2023. No changes today. As far as alprazolam  does not need to be refilled, she can continue annual follow-ups.  Orders: -     ALPRAZolam ; Take 1 tablet (0.25 mg total) by mouth daily as needed for anxiety.  Dispense: 20 tablet; Refill: 1  Hyperlipidemia, unspecified hyperlipidemia type Assessment & Plan: She is no longer on rosuvastatin . Continue low-fat diet, further recommendation will be given according to lipid panel result.  Orders: -     Comprehensive metabolic panel with GFR; Future -     Lipid panel; Future  Prediabetes Assessment & Plan: Hemoglobin A1c 6.3 in 04/2023. Encouraged consistency with a healthy lifestyle for diabetes  prevention.  Orders: -     Hemoglobin A1c; Future  Gouty arthritis of great toe Assessment & Plan: One episode last year, no prior history. Continue low purine diet. Uric acid added to today's labs.  Orders: -     Uric acid; Future  Influenza vaccine needed -     Flu vaccine HIGH DOSE PF(Fluzone Trivalent)  Return in 1 year (on 02/24/2025) for CPE, chronic problems.  Jams Trickett G. [place], MD  [facility]. [place] office.

## 2024-02-26 ENCOUNTER — Ambulatory Visit: Payer: Self-pay | Admitting: Family Medicine

## 2024-02-26 NOTE — Assessment & Plan Note (Signed)
We discussed the importance of regular physical activity and healthy diet for prevention of chronic illness and/or complications. Preventive guidelines reviewed. Vaccination up to date. Next CPE in a year. 

## 2024-02-27 ENCOUNTER — Encounter: Payer: Self-pay | Admitting: Family Medicine

## 2024-02-27 LAB — CYCLIC CITRUL PEPTIDE ANTIBODY, IGG: Cyclic Citrullin Peptide Ab: <16 U

## 2024-02-27 LAB — RHEUMATOID FACTOR: Rheumatoid fact SerPl-aCnc: <10 [IU]/mL (ref <14)

## 2024-03-17 ENCOUNTER — Other Ambulatory Visit: Payer: Self-pay | Admitting: Family Medicine

## 2024-03-20 ENCOUNTER — Encounter: Payer: Self-pay | Admitting: Radiology

## 2024-04-06 NOTE — Progress Notes (Signed)
  Electrophysiology Office Note:   Date:  04/07/2024  ID:  Glenda [name], DOB 1950/07/05, MRN [id number]  Primary Cardiologist: Shelda Bruckner, MD Primary Heart Failure: None Electrophysiologist: Dejay Kronk Gladis Norton, MD      History of Present Illness:   Glenda [name] is a 73 y.o. female with h/o CVA, hypertension, sleep apnea, aortic atherosclerosis, atrial fibrillation seen today for routine electrophysiology followup.   Since last being seen in our clinic the patient reports doing well today.  She visited her daughter in [place]  and was in atrial fibrillation most of the time there.  When she came back to [facility], she started to feel better without episodes of atrial fibrillation.  Despite this, she would prefer rhythm control alternative to her flecainide .  she denies chest pain, palpitations, dyspnea, PND, orthopnea, nausea, vomiting, dizziness, syncope, edema, weight gain, or early satiety.   Review of systems complete and found to be negative unless listed in HPI.   EP Information / Studies Reviewed:    EKG is not ordered today. EKG from 01/26/2024 reviewed which showed sinus bradycardia        Risk Assessment/Calculations:    CHA2DS2-VASc Score = 6   This indicates a 9.7% annual risk of stroke. The patient's score is based upon: CHF History: 0 HTN History: 1 Diabetes History: 0 Stroke History: 2 Vascular Disease History: 1 (aortic atherosclerosis) Age Score: 1 Gender Score: 1            Physical Exam:   VS:  BP 120/76 (BP Location: Left Arm, Patient Position: Sitting, Cuff Size: Large)   Pulse 62   Ht 5' 7 (1.702 m)   Wt 215 lb (97.5 kg)   SpO2 96%   BMI 33.67 kg/m    Wt Readings from Last 3 Encounters:  04/07/24 215 lb (97.5 kg)  02/25/24 221 lb (100.2 kg)  02/24/24 217 lb 6.4 oz (98.6 kg)     GEN: Well nourished, well developed in no acute distress NECK: No JVD; No carotid bruits CARDIAC: Regular rate and rhythm, no murmurs, rubs,  gallops RESPIRATORY:  Clear to auscultation without rales, wheezing or rhonchi  ABDOMEN: Soft, non-tender, non-distended EXTREMITIES:  No edema; No deformity   ASSESSMENT AND PLAN:    1.  Persistent atrial fibrillation: Post ablation 09/05/2021.  On flecainide  and metoprolol .  She has unfortunately had more frequent episodes of atrial fibrillation.  She would prefer ablation compared to alternative antiarrhythmics.  Risks and benefits have been discussed.  She understands the risks and has agreed to the procedure.  Risk, benefits, and alternatives to EP study and radiofrequency/pulse field ablation for afib were also discussed in detail today. These risks include but are not limited to stroke, bleeding, vascular damage, tamponade, perforation, damage to the esophagus, lungs, and other structures, pulmonary vein stenosis, worsening renal function, and death. The patient understands these risk and wishes to proceed.  We Glenda [name] therefore proceed with catheter ablation at the next available time.  Carto, ICE, anesthesia are requested for the procedure.  This patient Glenda [name] require CT prior to ablation. To be scheduled.   2.  Secondary to coagulable state: On Eliquis   3.  High risk medication monitoring: On flecainide .  EKG stable.  4.  Obstructive sleep apnea: Not on CPAP.  Has declined repeat sleep study.  5.  Hypertension: Well-controlled  Follow up with EP Team as usual post procedure  Signed, Dolorez Jeffrey Gladis Norton, MD

## 2024-04-07 ENCOUNTER — Ambulatory Visit: Attending: Cardiology | Admitting: Cardiology

## 2024-04-07 ENCOUNTER — Encounter: Payer: Self-pay | Admitting: Cardiology

## 2024-04-07 VITALS — BP 120/76 | HR 62 | Ht 67.0 in | Wt 215.0 lb

## 2024-04-07 DIAGNOSIS — Z01812 Encounter for preprocedural laboratory examination: Secondary | ICD-10-CM

## 2024-04-07 DIAGNOSIS — D6869 Other thrombophilia: Secondary | ICD-10-CM | POA: Diagnosis not present

## 2024-04-07 DIAGNOSIS — Z79899 Other long term (current) drug therapy: Secondary | ICD-10-CM | POA: Diagnosis not present

## 2024-04-07 DIAGNOSIS — G4733 Obstructive sleep apnea (adult) (pediatric): Secondary | ICD-10-CM | POA: Diagnosis not present

## 2024-04-07 DIAGNOSIS — I4819 Other persistent atrial fibrillation: Secondary | ICD-10-CM | POA: Diagnosis not present

## 2024-04-07 DIAGNOSIS — I10 Essential (primary) hypertension: Secondary | ICD-10-CM | POA: Diagnosis not present

## 2024-04-07 NOTE — Patient Instructions (Signed)
 Medication Instructions:  Your physician recommends that you continue on your current medications as directed. Please refer to the Current Medication list given to you today.  *If you need a refill on your cardiac medications before your next appointment, please call your pharmacy*   Lab Work: Pre procedure labs -- we will call you to schedule:  BMP & CBC  If you have a lab test that is abnormal and we need to change your treatment, we will call you to review the results -- otherwise no news is good news.    Testing/Procedures: Your physician has recommended that you have an ablation. Catheter ablation is a medical procedure used to treat some cardiac arrhythmias (irregular heartbeats). During catheter ablation, a long, thin, flexible tube is put into a blood vessel in your groin (upper thigh), or neck. This tube is called an ablation catheter. It is then guided to your heart through the blood vessel. Radio frequency waves destroy small areas of heart tissue where abnormal heartbeats may cause an arrhythmia to start. Please review the information below on ablation and after care.  Your ablation is scheduled for 06/23/2024. Please arrive at [facility] at 5:30 am.  We will call/send instructions at a later date.   Follow-Up: At [facility], you and your health needs are our priority.  As part of our continuing mission to provide you with exceptional heart care, we have created designated Provider Care Teams.  These Care Teams include your primary Cardiologist (physician) and Advanced Practice Providers (APPs -  Physician Assistants and Nurse Practitioners) who all work together to provide you with the care you need, when you need it.  Your next appointment:   1 month(s) after your ablation  The format for your next appointment:   In Person  Provider:   AFib clinic   Thank you for choosing [facility]!!   Maeola Domino, RN [phone]    Other  Instructions   Cardiac Ablation Cardiac ablation is a procedure to destroy (ablate) some heart tissue that is sending bad signals. These bad signals cause problems in heart rhythm. The heart has many areas that make these signals. If there are problems in these areas, they can make the heart beat in a way that is not normal. Destroying some tissues can help make the heart rhythm normal. Tell your doctor about: Any allergies you have. All medicines you are taking. These include vitamins, herbs, eye drops, creams, and over-the-counter medicines. Any problems you or family members have had with medicines that make you fall asleep (anesthetics). Any blood disorders you have. Any surgeries you have had. Any medical conditions you have, such as kidney failure. Whether you are pregnant or may be pregnant. What are the risks? This is a safe procedure. But problems may occur, including: Infection. Bruising and bleeding. Bleeding into the chest. Stroke or blood clots. Damage to nearby areas of your body. Allergies to medicines or dyes. The need for a pacemaker if the normal system is damaged. Failure of the procedure to treat the problem. What happens before the procedure? Medicines Ask your doctor about: Changing or stopping your normal medicines. This is important. Taking aspirin  and ibuprofen. Do not take these medicines unless your doctor tells you to take them. Taking other medicines, vitamins, herbs, and supplements. General instructions Follow instructions from your doctor about what you cannot eat or drink. Plan to have someone take you home from the hospital or clinic. If you will be going home right  after the procedure, plan to have someone with you for 24 hours. Ask your doctor what steps will be taken to prevent infection. What happens during the procedure?  An IV tube will be put into one of your veins. You will be given a medicine to help you relax. The skin on your neck or  groin will be numbed. A cut (incision) will be made in your neck or groin. A needle will be put through your cut and into a large vein. A tube (catheter) will be put into the needle. The tube will be moved to your heart. Dye may be put through the tube. This helps your doctor see your heart. Small devices (electrodes) on the tube will send out signals. A type of energy will be used to destroy some heart tissue. The tube will be taken out. Pressure will be held on your cut. This helps stop bleeding. A bandage will be put over your cut. The exact procedure may vary among doctors and hospitals. What happens after the procedure? You will be watched until you leave the hospital or clinic. This includes checking your heart rate, breathing rate, oxygen, and blood pressure. Your cut will be watched for bleeding. You will need to lie still for a few hours. Do not drive for 24 hours or as long as your doctor tells you. Summary Cardiac ablation is a procedure to destroy some heart tissue. This is done to treat heart rhythm problems. Tell your doctor about any medical conditions you may have. Tell him or her about all medicines you are taking to treat them. This is a safe procedure. But problems may occur. These include infection, bruising, bleeding, and damage to nearby areas of your body. Follow what your doctor tells you about food and drink. You may also be told to change or stop some of your medicines. After the procedure, do not drive for 24 hours or as long as your doctor tells you. This information is not intended to replace advice given to you by your health care provider. Make sure you discuss any questions you have with your health care provider. Document Revised: 07/25/2021 Document Reviewed: 04/06/2019 Elsevier Patient Education  2023 [employer]   Cardiac Ablation, Care After  This sheet gives you information about how to care for yourself after your procedure. Your health care  provider may also give you more specific instructions. If you have problems or questions, contact your health care provider. What can I expect after the procedure? After the procedure, it is common to have: Bruising around your puncture site. Tenderness around your puncture site. Skipped heartbeats. If you had an atrial fibrillation ablation, you may have atrial fibrillation during the first several months after your procedure.  Tiredness (fatigue).  Follow these instructions at home: Puncture site care  Follow instructions from your health care provider about how to take care of your puncture site. Make sure you: If present, leave stitches (sutures), skin glue, or adhesive strips in place. These skin closures may need to stay in place for up to 2 weeks. If adhesive strip edges start to loosen and curl up, you may trim the loose edges. Do not remove adhesive strips completely unless your health care provider tells you to do that. If a large square bandage is present, this may be removed 24 hours after surgery.  Check your puncture site every day for signs of infection. Check for: Redness, swelling, or pain. Fluid or blood. If your puncture site starts to bleed,  lie down on your back, apply firm pressure to the area, and contact your health care provider. Warmth. Pus or a bad smell. A pea or marble sized lump/knot at the site is normal and can take up to three months to resolve.  Driving Do not drive for at least 4 days after your procedure or however long your health care provider recommends. (Do not resume driving if you have previously been instructed not to drive for other health reasons.) Do not drive or use heavy machinery while taking prescription pain medicine. Activity Avoid activities that take a lot of effort for at least 7 days after your procedure. Do not lift anything that is heavier than 5 lb (4.5 kg) for one week.  No sexual activity for 1 week.  Return to your normal  activities as told by your health care provider. Ask your health care provider what activities are safe for you. General instructions Take over-the-counter and prescription medicines only as told by your health care provider. Do not use any products that contain nicotine or tobacco, such as cigarettes and e-cigarettes. If you need help quitting, ask your health care provider. You may shower after 24 hours, but Do not take baths, swim, or use a hot tub for 1 week.  Do not drink alcohol for 24 hours after your procedure. Keep all follow-up visits as told by your health care provider. This is important. Contact a health care provider if: You have redness, mild swelling, or pain around your puncture site. You have fluid or blood coming from your puncture site that stops after applying firm pressure to the area. Your puncture site feels warm to the touch. You have pus or a bad smell coming from your puncture site. You have a fever. You have chest pain or discomfort that spreads to your neck, jaw, or arm. You have chest pain that is worse with lying on your back or taking a deep breath. You are sweating a lot. You feel nauseous. You have a fast or irregular heartbeat. You have shortness of breath. You are dizzy or light-headed and feel the need to lie down. You have pain or numbness in the arm or leg closest to your puncture site. Get help right away if: Your puncture site suddenly swells. Your puncture site is bleeding and the bleeding does not stop after applying firm pressure to the area. These symptoms may represent a serious problem that is an emergency. Do not wait to see if the symptoms will go away. Get medical help right away. Call your local emergency services (911 in the [place] Do not drive yourself to the hospital. Summary After the procedure, it is normal to have bruising and tenderness at the puncture site in your groin, neck, or forearm. Check your puncture site every day for  signs of infection. Get help right away if your puncture site is bleeding and the bleeding does not stop after applying firm pressure to the area. This is a medical emergency. This information is not intended to replace advice given to you by your health care provider. Make sure you discuss any questions you have with your health care provider.

## 2024-04-07 NOTE — Addendum Note (Signed)
 Addended by: GRETEL MAEOLA CROME on: 04/07/2024 01:01 PM   Modules accepted: Orders

## 2024-04-25 NOTE — Addendum Note (Signed)
 Addended by: GRETEL MAEOLA CROME on: 04/25/2024 06:18 PM   Modules accepted: Orders

## 2024-05-23 ENCOUNTER — Telehealth: Payer: Self-pay

## 2024-05-23 ENCOUNTER — Other Ambulatory Visit: Payer: Self-pay

## 2024-05-23 DIAGNOSIS — I4819 Other persistent atrial fibrillation: Secondary | ICD-10-CM

## 2024-05-23 DIAGNOSIS — Z01812 Encounter for preprocedural laboratory examination: Secondary | ICD-10-CM

## 2024-05-23 NOTE — Telephone Encounter (Signed)
-----   Message from Nurse Maeola SQUIBB, RN sent at 04/25/2024  6:18 PM EST ----- Regarding: 06/23/2024  AFib ablation (repeat) Precert:  MD: Camnitz Type of ablation: A-fib Diagnosis: tachycardia CPT code: A-fib (06343) Ablation scheduled (date/time): 06/23/24  7:30 am  Procedure:  Added to calendar? Yes Orders entered? Yes Letter complete? No, >30 days before procedure Scheduled with cath lab? Yes Any medications to hold? Routine, Flecainide  Labs ordered (CBC, BMET, PT/INR if on warfarin): Yes Mapping system: CARTO (lab 4 or 6) CARTO/OPAL rep notified? Yes Cardiac CT needed? Yes, ordered Dye allergy? No Pre-meds ordered and instructions given? N/a Letter method: MyChart H&P: 04/07/24 Device: No  Follow-up:  Cassie/Angel, please schedule Routine.

## 2024-05-31 ENCOUNTER — Encounter (HOSPITAL_COMMUNITY): Payer: Self-pay

## 2024-05-31 ENCOUNTER — Telehealth (HOSPITAL_COMMUNITY): Payer: Self-pay

## 2024-05-31 NOTE — Telephone Encounter (Signed)
 Attempted to reach patient to discuss upcoming procedure, no answer. Left VM for patient to return call.

## 2024-06-02 ENCOUNTER — Ambulatory Visit (HOSPITAL_COMMUNITY)
Admission: RE | Admit: 2024-06-02 | Discharge: 2024-06-02 | Disposition: A | Discharge status: Home / Self Care | Source: Ambulatory Visit | Attending: Cardiology | Admitting: Cardiology

## 2024-06-02 DIAGNOSIS — I4819 Other persistent atrial fibrillation: Secondary | ICD-10-CM | POA: Insufficient documentation

## 2024-06-02 MED ORDER — IOHEXOL 350 MG/ML SOLN
80.0000 mL | Freq: Once | INTRAVENOUS | Status: AC | PRN
  Administered 2024-06-02: 80 mL via INTRAVENOUS

## 2024-06-03 LAB — CBC
Hematocrit: 45.1 % (ref 34.0–46.6)
Hemoglobin: 14.7 g/dL (ref 11.1–15.9)
MCH: 31.3 pg (ref 26.6–33.0)
MCHC: 32.6 g/dL (ref 31.5–35.7)
MCV: 96 fL (ref 79–97)
Platelets: 311 x10E3/uL (ref 150–450)
RBC: 4.69 x10E6/uL (ref 3.77–5.28)
RDW: 12.4 % (ref 11.7–15.4)
WBC: 6.9 x10E3/uL (ref 3.4–10.8)

## 2024-06-03 LAB — BASIC METABOLIC PANEL WITH GFR
BUN/Creatinine Ratio: 20 (ref 12–28)
BUN: 21 mg/dL (ref 8–27)
CO2: 26 mmol/L (ref 20–29)
Calcium: 9.8 mg/dL (ref 8.7–10.3)
Chloride: 98 mmol/L (ref 96–106)
Creatinine, Ser: 1.04 mg/dL — ABNORMAL HIGH (ref 0.57–1.00)
Glucose: 110 mg/dL — ABNORMAL HIGH (ref 70–99)
Potassium: 4.9 mmol/L (ref 3.5–5.2)
Sodium: 140 mmol/L (ref 134–144)
eGFR: 57 mL/min/1.73 — ABNORMAL LOW

## 2024-06-11 ENCOUNTER — Ambulatory Visit: Payer: Self-pay | Admitting: Cardiology

## 2024-06-22 NOTE — Pre-Procedure Instructions (Signed)
 Attempted to call patient regarding procedure instructions.  Left voicemail on the following items: Arrival time 0515 Nothing to eat or drink after midnight No meds AM of procedure Responsible person to drive you home and stay with you for 24 hrs  Have you missed any doses of anti-coagulant Eliquis - takes twice a day, if you have missed any doses please let us  know.  Don't take dose morning of procedure.

## 2024-06-22 NOTE — Anesthesia Preprocedure Evaluation (Signed)
"                                    Anesthesia Evaluation  Patient identified by MRN, date of birth, ID band Patient awake    Reviewed: Allergy & Precautions, NPO status , Patient's Chart, lab work & pertinent test results, reviewed documented beta blocker date and time   History of Anesthesia Complications Negative for: history of anesthetic complications  Airway Mallampati: II  TM Distance: >3 FB Neck ROM: Full    Dental  (+) Missing,    Pulmonary sleep apnea , former smoker   Pulmonary exam normal        Cardiovascular hypertension, Pt. on medications and Pt. on home beta blockers Normal cardiovascular exam+ dysrhythmias (on Eliquis ) Atrial Fibrillation   Normal TTE 2022   Neuro/Psych   Anxiety     CVA    GI/Hepatic negative GI ROS, Neg liver ROS,,,  Endo/Other  negative endocrine ROS    Renal/GU negative Renal ROS     Musculoskeletal  (+) Arthritis ,    Abdominal   Peds  Hematology negative hematology ROS (+)   Anesthesia Other Findings   Reproductive/Obstetrics                              Anesthesia Physical Anesthesia Plan  ASA: 3  Anesthesia Plan: General   Post-op Pain Management: Tylenol  PO (pre-op )*   Induction: Intravenous  PONV Risk Score and Plan: 3 and Treatment may vary due to age or medical condition, Ondansetron  and Dexamethasone   Airway Management Planned: Oral ETT  Additional Equipment: None  Intra-op Plan:   Post-operative Plan: Extubation in OR  Informed Consent: I have reviewed the patients History and Physical, chart, labs and discussed the procedure including the risks, benefits and alternatives for the proposed anesthesia with the patient or authorized representative who has indicated his/her understanding and acceptance.     Dental advisory given  Plan Discussed with: CRNA  Anesthesia Plan Comments:          Anesthesia Quick Evaluation  "

## 2024-06-23 ENCOUNTER — Encounter (HOSPITAL_COMMUNITY): Payer: Self-pay | Admitting: Anesthesiology

## 2024-06-23 ENCOUNTER — Other Ambulatory Visit: Payer: Self-pay

## 2024-06-23 ENCOUNTER — Encounter (HOSPITAL_COMMUNITY): Admission: RE | Disposition: A | Payer: Self-pay | Source: Home / Self Care | Attending: Cardiology

## 2024-06-23 ENCOUNTER — Ambulatory Visit (HOSPITAL_COMMUNITY)
Admission: RE | Admit: 2024-06-23 | Discharge: 2024-06-23 | Disposition: A | Source: Home / Self Care | Attending: Cardiology | Admitting: Cardiology

## 2024-06-23 LAB — POCT ACTIVATED CLOTTING TIME: Activated Clotting Time: 291 s

## 2024-06-23 MED ORDER — PHENYLEPHRINE 80 MCG/ML (10ML) SYRINGE FOR IV PUSH (FOR BLOOD PRESSURE SUPPORT)
PREFILLED_SYRINGE | INTRAVENOUS | Status: DC | PRN
  Administered 2024-06-23: 160 ug via INTRAVENOUS

## 2024-06-23 MED ORDER — PHENYLEPHRINE HCL-NACL 20-0.9 MG/250ML-% IV SOLN
INTRAVENOUS | Status: DC | PRN
  Administered 2024-06-23: 30 ug/min via INTRAVENOUS

## 2024-06-23 MED ORDER — ACETAMINOPHEN 500 MG PO TABS
1000.0000 mg | ORAL_TABLET | Freq: Once | ORAL | Status: AC
  Administered 2024-06-23: 1000 mg via ORAL
  Filled 2024-06-23: qty 2

## 2024-06-23 MED ORDER — ALBUMIN HUMAN 5 % IV SOLN: INTRAVENOUS | Status: DC | PRN

## 2024-06-23 MED ORDER — ATROPINE SULFATE 1 MG/10ML IJ SOSY
PREFILLED_SYRINGE | INTRAMUSCULAR | Status: AC
  Filled 2024-06-23: qty 10

## 2024-06-23 MED ORDER — FENTANYL CITRATE (PF) 250 MCG/5ML IJ SOLN
INTRAMUSCULAR | Status: DC | PRN
  Administered 2024-06-23: 100 ug via INTRAVENOUS

## 2024-06-23 MED ORDER — ONDANSETRON HCL 4 MG/2ML IJ SOLN
INTRAMUSCULAR | Status: DC | PRN
  Administered 2024-06-23: 4 mg via INTRAVENOUS

## 2024-06-23 MED ORDER — FENTANYL CITRATE (PF) 100 MCG/2ML IJ SOLN
INTRAMUSCULAR | Status: AC
  Filled 2024-06-23: qty 2

## 2024-06-23 MED ORDER — SODIUM CHLORIDE 0.9 % IV SOLN: INTRAVENOUS | Status: DC

## 2024-06-23 MED ORDER — ROCURONIUM BROMIDE 10 MG/ML (PF) SYRINGE
PREFILLED_SYRINGE | INTRAVENOUS | Status: DC | PRN
  Administered 2024-06-23: 60 mg via INTRAVENOUS
  Administered 2024-06-23: 40 mg via INTRAVENOUS

## 2024-06-23 MED ORDER — LIDOCAINE 2% (20 MG/ML) 5 ML SYRINGE
INTRAMUSCULAR | Status: DC | PRN
  Administered 2024-06-23: 60 mg via INTRAVENOUS

## 2024-06-23 MED ORDER — HEPARIN (PORCINE) IN NACL 1000-0.9 UT/500ML-% IV SOLN
INTRAVENOUS | Status: DC | PRN
  Administered 2024-06-23 (×3): 500 mL

## 2024-06-23 MED ORDER — PROPOFOL 10 MG/ML IV BOLUS
INTRAVENOUS | Status: DC | PRN
  Administered 2024-06-23: 110 mg via INTRAVENOUS
  Administered 2024-06-23: 30 mg via INTRAVENOUS

## 2024-06-23 MED ORDER — HEPARIN SODIUM (PORCINE) 1000 UNIT/ML IJ SOLN
INTRAMUSCULAR | Status: DC | PRN
  Administered 2024-06-23: 15000 [IU] via INTRAVENOUS

## 2024-06-23 MED ORDER — ACETAMINOPHEN 325 MG PO TABS: 650.0000 mg | ORAL_TABLET | ORAL | Status: DC | PRN

## 2024-06-23 MED ORDER — SUGAMMADEX SODIUM 200 MG/2ML IV SOLN
INTRAVENOUS | Status: DC | PRN
  Administered 2024-06-23 (×2): 100 mg via INTRAVENOUS
  Administered 2024-06-23: 200 mg via INTRAVENOUS

## 2024-06-23 MED ORDER — HEPARIN SODIUM (PORCINE) 1000 UNIT/ML IJ SOLN
INTRAMUSCULAR | Status: AC
  Filled 2024-06-23: qty 10

## 2024-06-23 MED ORDER — PROTAMINE SULFATE 10 MG/ML IV SOLN
INTRAVENOUS | Status: DC | PRN
  Administered 2024-06-23: 40 mg via INTRAVENOUS
  Administered 2024-06-23: 20 mg via INTRAVENOUS

## 2024-06-23 MED ORDER — LABETALOL HCL 5 MG/ML IV SOLN
INTRAVENOUS | Status: DC | PRN
  Administered 2024-06-23: 5 mg via INTRAVENOUS

## 2024-06-23 MED ORDER — ONDANSETRON HCL 4 MG/2ML IJ SOLN: 4.0000 mg | Freq: Four times a day (QID) | INTRAMUSCULAR | Status: DC | PRN

## 2024-06-23 MED ORDER — DEXAMETHASONE SOD PHOSPHATE PF 10 MG/ML IJ SOLN
INTRAMUSCULAR | Status: DC | PRN
  Administered 2024-06-23: 5 mg via INTRAVENOUS

## 2024-06-23 MED ORDER — EPHEDRINE SULFATE-NACL 50-0.9 MG/10ML-% IV SOSY
PREFILLED_SYRINGE | INTRAVENOUS | Status: DC | PRN
  Administered 2024-06-23 (×2): 5 mg via INTRAVENOUS

## 2024-06-23 NOTE — Progress Notes (Signed)
 Client states scratchy throat and cough, charge CRNA notified and will notify anesthesiologist

## 2024-06-23 NOTE — Discharge Instructions (Signed)

## 2024-06-23 NOTE — Transfer of Care (Signed)
 Immediate Anesthesia Transfer of Care Note  Patient: Glenda [name]  Procedure(s) Performed: ATRIAL FIBRILLATION ABLATION  Patient Location: Cath Lab  Anesthesia Type:General  Level of Consciousness: awake, alert , and oriented  Airway & Oxygen Therapy: Patient Spontanous Breathing and Patient connected to face mask oxygen  Post-op Assessment: Report given to RN and Post -op Vital signs reviewed and stable  Post vital signs: Reviewed and stable  Last Vitals:  Vitals Value Taken Time  BP 107/59 06/23/24 09:05  Temp    Pulse 62 06/23/24 09:07  Resp 23 06/23/24 09:07  SpO2 98 % 06/23/24 09:07  Vitals shown include unfiled device data.  Last Pain:  Vitals:   06/23/24 0858  TempSrc: Oral  PainSc: 0-No pain         Complications: There were no known notable events for this encounter.

## 2024-06-23 NOTE — Anesthesia Procedure Notes (Signed)
 Procedure Name: Intubation Date/Time: 06/23/2024 7:45 AM  Performed by: Hedy Jarred, CRNAPre-anesthesia Checklist: Patient identified, Emergency Drugs available, Suction available, Patient being monitored and Timeout performed Patient Re-evaluated:Patient Re-evaluated prior to induction Oxygen Delivery Method: Circle system utilized Preoxygenation: Pre-oxygenation with 100% oxygen Induction Type: IV induction Ventilation: Mask ventilation without difficulty and Oral airway inserted - appropriate to patient size Laryngoscope Size: Glidescope and 3 Grade View: Grade I Tube type: Subglottic suction tube Tube size: 7.0 mm Number of attempts: 1 Airway Equipment and Method: Stylet and Video-laryngoscopy Placement Confirmation: ETT inserted through vocal cords under direct vision, breath sounds checked- equal and bilateral and CO2 detector Secured at: 21 cm Tube secured with: Tape Dental Injury: Teeth and Oropharynx as per pre-operative assessment  Difficulty Due To: Difficulty was anticipated, Difficult Airway- due to anterior larynx, Difficult Airway- due to large tongue and Difficult Airway- due to reduced neck mobility

## 2024-06-23 NOTE — Anesthesia Postprocedure Evaluation (Signed)
"   Anesthesia Post Note  Patient: Glenda [name]  Procedure(s) Performed: ATRIAL FIBRILLATION ABLATION     Patient location during evaluation: PACU Anesthesia Type: General Level of consciousness: awake and alert Pain management: pain level controlled Vital Signs Assessment: post-procedure vital signs reviewed and stable Respiratory status: spontaneous breathing, nonlabored ventilation and respiratory function stable Cardiovascular status: blood pressure returned to baseline Postop Assessment: no apparent nausea or vomiting Anesthetic complications: no   There were no known notable events for this encounter.  Last Vitals:  Vitals:   06/23/24 0930 06/23/24 0935  BP: 102/82 (!) 127/52  Pulse: 64 65  Resp: 20 19  Temp:    SpO2: 93% 95%    Last Pain:  Vitals:   06/23/24 0926  TempSrc: Oral  PainSc:    Pain Goal:                   Vertell Row      "

## 2024-06-23 NOTE — Progress Notes (Signed)
 Up and walked and tolerated well; bilat groins stable, no bleeding or hematoma; client states cough and sore throat better now and states feels like going home; able to void without difficulty

## 2024-06-23 NOTE — H&P (Signed)
" °  Electrophysiology Office Note:   Date:  06/23/2024  ID:  TONYE TANCREDI, DOB 1951/04/18, MRN [id number]  Primary Cardiologist: Shelda Bruckner, MD Primary Heart Failure: None Electrophysiologist: Jestin Burbach Gladis Norton, MD      History of Present Illness:   Glenda [name] is a 74 y.o. female with h/o CVA, hypertension, sleep apnea, aortic atherosclerosis, atrial fibrillation seen today for routine electrophysiology followup.   Today, denies symptoms of palpitations, chest pain, dyspnea, orthopnea, PND, lower extremity edema, claudication, dizziness, presyncope, syncope, bleeding, or neurologic sequela. The patient is tolerating medications without difficulties. Plan ablation today.   EP Information / Studies Reviewed:    EKG is not ordered today. EKG from 01/26/2024 reviewed which showed sinus bradycardia        Risk Assessment/Calculations:    CHA2DS2-VASc Score = 6   This indicates a 9.7% annual risk of stroke. The patient's score is based upon: CHF History: 0 HTN History: 1 Diabetes History: 0 Stroke History: 2 Vascular Disease History: 1 (aortic atherosclerosis) Age Score: 1 Gender Score: 1            Physical Exam:   VS:  BP (!) 159/58   Pulse 66   Temp 98.4 F (36.9 C) (Oral)   Resp 18   Ht 5' 7.5 (1.715 m)   Wt 97.5 kg   SpO2 98%   BMI 33.18 kg/m    Wt Readings from Last 3 Encounters:  06/23/24 97.5 kg  04/07/24 97.5 kg  02/25/24 100.2 kg    GEN: Well nourished, well developed in no acute distress NECK: No JVD; No carotid bruits CARDIAC: Regular rate and rhythm, no murmurs, rubs, gallops RESPIRATORY:  Clear to auscultation without rales, wheezing or rhonchi  ABDOMEN: Soft, non-tender, non-distended EXTREMITIES:  No edema; No deformity    ASSESSMENT AND PLAN:    1.  Persistent atrial fibrillation: KEARSTYN AVITIA has presented today for surgery, with the diagnosis of AF.  The various methods of treatment have been discussed with the patient and  family. After consideration of risks, benefits and other options for treatment, the patient has consented to  Procedure(s): Catheter ablation as a surgical intervention .  Risks include but not limited to complete heart block, stroke, esophageal damage, nerve damage, bleeding, vascular damage, tamponade, perforation, MI, and death. The patient's history has been reviewed, patient examined, no change in status, stable for surgery.  I have reviewed the patient's chart and labs.  Questions were answered to the patient's satisfaction.    Leiann Sporer Norton, MD 06/23/2024 7:05 AM  "

## 2024-07-21 ENCOUNTER — Ambulatory Visit (HOSPITAL_COMMUNITY): Admitting: Physician Assistant

## 2024-09-20 ENCOUNTER — Ambulatory Visit (HOSPITAL_COMMUNITY): Admitting: Physician Assistant
